# Patient Record
Sex: Male | Born: 1945
Health system: Southern US, Community
[De-identification: ages and names within clinical notes are randomized; demographics above are authoritative.]

## PROBLEM LIST (undated history)

## (undated) DIAGNOSIS — I10 Essential (primary) hypertension: Secondary | ICD-10-CM

## (undated) DIAGNOSIS — N529 Male erectile dysfunction, unspecified: Secondary | ICD-10-CM

## (undated) DIAGNOSIS — B9681 Helicobacter pylori [H. pylori] as the cause of diseases classified elsewhere: Secondary | ICD-10-CM

## (undated) DIAGNOSIS — K219 Gastro-esophageal reflux disease without esophagitis: Secondary | ICD-10-CM

## (undated) DIAGNOSIS — K2970 Gastritis, unspecified, without bleeding: Secondary | ICD-10-CM

## (undated) DIAGNOSIS — K8020 Calculus of gallbladder without cholecystitis without obstruction: Secondary | ICD-10-CM

## (undated) DIAGNOSIS — M545 Low back pain, unspecified: Secondary | ICD-10-CM

## (undated) DIAGNOSIS — G8929 Other chronic pain: Secondary | ICD-10-CM

## (undated) DIAGNOSIS — I2510 Atherosclerotic heart disease of native coronary artery without angina pectoris: Secondary | ICD-10-CM

## (undated) DIAGNOSIS — Z9289 Personal history of other medical treatment: Secondary | ICD-10-CM

## (undated) DIAGNOSIS — N400 Enlarged prostate without lower urinary tract symptoms: Secondary | ICD-10-CM

## (undated) DIAGNOSIS — D126 Benign neoplasm of colon, unspecified: Secondary | ICD-10-CM

## (undated) DIAGNOSIS — E785 Hyperlipidemia, unspecified: Secondary | ICD-10-CM

## (undated) HISTORY — PX: GASTRECTOMY: SHX58

## (undated) HISTORY — DX: Low back pain: M54.5

## (undated) HISTORY — DX: Other chronic pain: G89.29

## (undated) HISTORY — DX: Benign neoplasm of colon, unspecified: D12.6

## (undated) HISTORY — DX: Gastritis, unspecified, without bleeding: K29.70

## (undated) HISTORY — DX: Essential (primary) hypertension: I10

## (undated) HISTORY — DX: Atherosclerotic heart disease of native coronary artery without angina pectoris: I25.10

## (undated) HISTORY — DX: Helicobacter pylori (H. pylori) as the cause of diseases classified elsewhere: B96.81

## (undated) HISTORY — DX: Male erectile dysfunction, unspecified: N52.9

## (undated) HISTORY — DX: Personal history of other medical treatment: Z92.89

## (undated) HISTORY — DX: Low back pain, unspecified: M54.50

## (undated) HISTORY — DX: Hyperlipidemia, unspecified: E78.5

## (undated) HISTORY — DX: Calculus of gallbladder without cholecystitis without obstruction: K80.20

## (undated) SURGERY — ESOPHAGOGASTRODUODENOSCOPY (EGD) WITH PROPOFOL
Anesthesia: Monitor Anesthesia Care

---

## 1996-04-24 HISTORY — PX: CORONARY ARTERY BYPASS GRAFT: SHX141

## 1997-08-29 ENCOUNTER — Emergency Department (HOSPITAL_COMMUNITY): Admission: EM | Admit: 1997-08-29 | Discharge: 1997-08-29 | Payer: Self-pay | Admitting: Emergency Medicine

## 2003-06-11 ENCOUNTER — Encounter: Admission: RE | Admit: 2003-06-11 | Discharge: 2003-06-11 | Payer: Self-pay | Admitting: Family Medicine

## 2003-06-16 ENCOUNTER — Encounter: Admission: RE | Admit: 2003-06-16 | Discharge: 2003-06-16 | Payer: Self-pay | Admitting: Family Medicine

## 2003-10-15 ENCOUNTER — Encounter: Admission: RE | Admit: 2003-10-15 | Discharge: 2003-10-15 | Payer: Self-pay | Admitting: Family Medicine

## 2004-07-20 ENCOUNTER — Ambulatory Visit: Payer: Self-pay | Admitting: Family Medicine

## 2004-07-27 ENCOUNTER — Ambulatory Visit: Payer: Self-pay | Admitting: Family Medicine

## 2004-08-17 ENCOUNTER — Ambulatory Visit: Payer: Self-pay | Admitting: Family Medicine

## 2004-12-28 ENCOUNTER — Ambulatory Visit: Payer: Self-pay | Admitting: Family Medicine

## 2005-07-05 ENCOUNTER — Ambulatory Visit: Payer: Self-pay | Admitting: Family Medicine

## 2005-09-26 ENCOUNTER — Ambulatory Visit: Payer: Self-pay | Admitting: Family Medicine

## 2005-09-27 ENCOUNTER — Encounter: Payer: Self-pay | Admitting: Family Medicine

## 2005-10-17 ENCOUNTER — Ambulatory Visit: Payer: Self-pay | Admitting: Family Medicine

## 2005-10-17 LAB — FECAL OCCULT BLOOD, GUAIAC: Fecal Occult Blood: NEGATIVE

## 2005-11-14 ENCOUNTER — Ambulatory Visit: Payer: Self-pay | Admitting: Family Medicine

## 2005-11-22 ENCOUNTER — Ambulatory Visit: Payer: Self-pay | Admitting: Gastroenterology

## 2005-12-29 ENCOUNTER — Ambulatory Visit: Payer: Self-pay | Admitting: Family Medicine

## 2006-03-19 ENCOUNTER — Ambulatory Visit: Payer: Self-pay | Admitting: Family Medicine

## 2007-01-09 ENCOUNTER — Ambulatory Visit: Payer: Self-pay | Admitting: Family Medicine

## 2007-01-09 DIAGNOSIS — F528 Other sexual dysfunction not due to a substance or known physiological condition: Secondary | ICD-10-CM | POA: Insufficient documentation

## 2007-01-09 DIAGNOSIS — IMO0002 Reserved for concepts with insufficient information to code with codable children: Secondary | ICD-10-CM

## 2007-01-09 DIAGNOSIS — I10 Essential (primary) hypertension: Secondary | ICD-10-CM | POA: Insufficient documentation

## 2007-01-09 DIAGNOSIS — K209 Esophagitis, unspecified without bleeding: Secondary | ICD-10-CM | POA: Insufficient documentation

## 2007-01-09 DIAGNOSIS — Z951 Presence of aortocoronary bypass graft: Secondary | ICD-10-CM | POA: Insufficient documentation

## 2007-01-09 DIAGNOSIS — I2510 Atherosclerotic heart disease of native coronary artery without angina pectoris: Secondary | ICD-10-CM

## 2007-01-09 DIAGNOSIS — E785 Hyperlipidemia, unspecified: Secondary | ICD-10-CM

## 2007-01-10 ENCOUNTER — Ambulatory Visit (HOSPITAL_COMMUNITY): Admission: RE | Admit: 2007-01-10 | Discharge: 2007-01-10 | Payer: Self-pay | Admitting: Family Medicine

## 2007-01-16 ENCOUNTER — Encounter: Payer: Self-pay | Admitting: Family Medicine

## 2007-01-23 ENCOUNTER — Encounter: Admission: RE | Admit: 2007-01-23 | Discharge: 2007-01-23 | Payer: Self-pay | Admitting: Orthopaedic Surgery

## 2007-04-22 ENCOUNTER — Encounter: Payer: Self-pay | Admitting: Family Medicine

## 2007-05-01 ENCOUNTER — Encounter: Payer: Self-pay | Admitting: Family Medicine

## 2007-05-27 ENCOUNTER — Telehealth: Payer: Self-pay | Admitting: Family Medicine

## 2007-06-10 ENCOUNTER — Ambulatory Visit: Payer: Self-pay | Admitting: Gastroenterology

## 2007-06-23 LAB — HM COLONOSCOPY

## 2007-06-24 ENCOUNTER — Encounter: Payer: Self-pay | Admitting: Family Medicine

## 2007-06-24 ENCOUNTER — Ambulatory Visit: Payer: Self-pay | Admitting: Gastroenterology

## 2007-08-23 ENCOUNTER — Telehealth: Payer: Self-pay | Admitting: Family Medicine

## 2007-11-22 ENCOUNTER — Telehealth (INDEPENDENT_AMBULATORY_CARE_PROVIDER_SITE_OTHER): Payer: Self-pay | Admitting: *Deleted

## 2007-11-26 ENCOUNTER — Ambulatory Visit: Payer: Self-pay | Admitting: Family Medicine

## 2007-12-06 LAB — CONVERTED CEMR LAB
ALT: 28 units/L (ref 0–53)
AST: 26 units/L (ref 0–37)
Alkaline Phosphatase: 93 units/L (ref 39–117)
Bilirubin, Direct: 0.3 mg/dL (ref 0.0–0.3)
CO2: 29 meq/L (ref 19–32)
Calcium: 9.4 mg/dL (ref 8.4–10.5)
Chloride: 106 meq/L (ref 96–112)
Glucose, Bld: 94 mg/dL (ref 70–99)
Hemoglobin: 14.8 g/dL (ref 13.0–17.0)
LDL Cholesterol: 86 mg/dL (ref 0–99)
Lymphocytes Relative: 14.4 % (ref 12.0–46.0)
Monocytes Relative: 12.2 % — ABNORMAL HIGH (ref 3.0–12.0)
Neutro Abs: 5.8 10*3/uL (ref 1.4–7.7)
Neutrophils Relative %: 71.9 % (ref 43.0–77.0)
RBC: 4.91 M/uL (ref 4.22–5.81)
RDW: 12.8 % (ref 11.5–14.6)
Sodium: 142 meq/L (ref 135–145)
TSH: 0.35 microintl units/mL (ref 0.35–5.50)
Total CHOL/HDL Ratio: 3.2
Total Protein: 7.3 g/dL (ref 6.0–8.3)
Triglycerides: 88 mg/dL (ref 0–149)

## 2008-03-05 ENCOUNTER — Ambulatory Visit: Payer: Self-pay | Admitting: Family Medicine

## 2008-04-20 ENCOUNTER — Encounter: Payer: Self-pay | Admitting: Family Medicine

## 2008-05-13 ENCOUNTER — Encounter: Payer: Self-pay | Admitting: Family Medicine

## 2008-09-24 ENCOUNTER — Telehealth: Payer: Self-pay | Admitting: Family Medicine

## 2009-01-01 ENCOUNTER — Ambulatory Visit: Payer: Self-pay | Admitting: Family Medicine

## 2009-01-04 LAB — CONVERTED CEMR LAB
AST: 27 units/L (ref 0–37)
Alkaline Phosphatase: 85 units/L (ref 39–117)
Basophils Relative: 0.3 % (ref 0.0–3.0)
Bilirubin, Direct: 0.2 mg/dL (ref 0.0–0.3)
Creatinine, Ser: 1.1 mg/dL (ref 0.4–1.5)
Eosinophils Absolute: 0.1 10*3/uL (ref 0.0–0.7)
Glucose, Bld: 103 mg/dL — ABNORMAL HIGH (ref 70–99)
HDL: 44.3 mg/dL (ref >39.00)
Hemoglobin: 15 g/dL (ref 13.0–17.0)
LDL Cholesterol: 63 mg/dL (ref 0–99)
Lymphocytes Relative: 20.7 % (ref 12.0–46.0)
Monocytes Relative: 8.3 % (ref 3.0–12.0)
Neutro Abs: 4.6 10*3/uL (ref 1.4–7.7)
Neutrophils Relative %: 69.8 % (ref 43.0–77.0)
Phosphorus: 2.4 mg/dL (ref 2.3–4.6)
Potassium: 3.7 meq/L (ref 3.5–5.1)
RBC: 4.95 M/uL (ref 4.22–5.81)
Sodium: 143 meq/L (ref 135–145)
TSH: 0.24 microintl units/mL — ABNORMAL LOW (ref 0.35–5.50)
Total CHOL/HDL Ratio: 3
Total Protein: 7.2 g/dL (ref 6.0–8.3)
VLDL: 12.2 mg/dL (ref 0.0–40.0)
WBC: 6.6 10*3/uL (ref 4.5–10.5)

## 2009-01-05 ENCOUNTER — Ambulatory Visit: Payer: Self-pay | Admitting: Family Medicine

## 2009-01-05 DIAGNOSIS — E039 Hypothyroidism, unspecified: Secondary | ICD-10-CM | POA: Insufficient documentation

## 2009-01-06 LAB — CONVERTED CEMR LAB
Free T4: 0.9 ng/dL (ref 0.6–1.6)
T3, Free: 2.8 pg/mL (ref 2.3–4.2)

## 2009-05-12 ENCOUNTER — Encounter: Payer: Self-pay | Admitting: Family Medicine

## 2009-06-18 ENCOUNTER — Encounter: Payer: Self-pay | Admitting: Family Medicine

## 2009-12-31 ENCOUNTER — Ambulatory Visit: Payer: Self-pay | Admitting: Family Medicine

## 2010-01-03 LAB — CONVERTED CEMR LAB
AST: 50 units/L — ABNORMAL HIGH (ref 0–37)
Albumin: 4 g/dL (ref 3.5–5.2)
BUN: 14 mg/dL (ref 6–23)
Basophils Absolute: 0 10*3/uL (ref 0.0–0.1)
CO2: 30 meq/L (ref 19–32)
Chloride: 103 meq/L (ref 96–112)
Cholesterol: 145 mg/dL (ref 0–200)
Creatinine, Ser: 0.9 mg/dL (ref 0.4–1.5)
Eosinophils Absolute: 0.1 10*3/uL (ref 0.0–0.7)
HCT: 41.5 % (ref 39.0–52.0)
HDL: 44 mg/dL (ref >39.00)
Hemoglobin: 14.1 g/dL (ref 13.0–17.0)
Lymphs Abs: 1.6 10*3/uL (ref 0.7–4.0)
MCHC: 33.9 g/dL (ref 30.0–36.0)
MCV: 87.9 fL (ref 78.0–100.0)
Monocytes Absolute: 0.6 10*3/uL (ref 0.1–1.0)
Neutro Abs: 5.6 10*3/uL (ref 1.4–7.7)
PSA: 1.16 ng/mL (ref 0.10–4.00)
Platelets: 147 10*3/uL — ABNORMAL LOW (ref 150.0–400.0)
RDW: 13.6 % (ref 11.5–14.6)
TSH: 0.38 microintl units/mL (ref 0.35–5.50)
Total Bilirubin: 1.2 mg/dL (ref 0.3–1.2)
Total CHOL/HDL Ratio: 3
Triglycerides: 126 mg/dL (ref 0.0–149.0)

## 2010-04-05 ENCOUNTER — Ambulatory Visit: Payer: Self-pay | Admitting: Family Medicine

## 2010-04-05 DIAGNOSIS — R740 Nonspecific elevation of levels of transaminase and lactic acid dehydrogenase [LDH]: Secondary | ICD-10-CM

## 2010-04-11 LAB — CONVERTED CEMR LAB
Albumin: 4.1 g/dL (ref 3.5–5.2)
Alkaline Phosphatase: 121 units/L — ABNORMAL HIGH (ref 39–117)
Bilirubin, Direct: 0.1 mg/dL (ref 0.0–0.3)
Total Bilirubin: 1.1 mg/dL (ref 0.3–1.2)

## 2010-05-02 ENCOUNTER — Encounter: Payer: Self-pay | Admitting: Family Medicine

## 2010-05-09 ENCOUNTER — Encounter: Payer: Self-pay | Admitting: Family Medicine

## 2010-05-26 NOTE — Letter (Signed)
Summary: [facility]  [facility]   Imported By: Lanelle Bal 05/21/2009 13:49:15  _____________________________________________________________________  External Attachment:    Type:   Image     Comment:   External Document

## 2010-05-26 NOTE — Assessment & Plan Note (Signed)
Summary: ROA FOR REFILL OF MEDS/JRR   Vital Signs:  Patient profile:   65 year old male Height:      69 inches Weight:      187 pounds BMI:     27.71 Temp:     98.8 degrees F oral Pulse rate:   60 / minute Pulse rhythm:   regular BP sitting:   124 / 70  (left arm) Cuff size:   regular  Vitals Entered By: Lewanda Rife LPN (December 31, 2009 11:23 AM) CC: refill med   History of Present Illness: here for f/u of lipids and HTN and thyroid monitoring   is feeling just great  nothing new  is taking care of himself     wt is up 3 lb- knows he needs to eat a bit less - hard time doing it   bp is good at 124/70 there was some question about ? inaccurate cuff at cardiol visit and higher readings at home  due for chol check on statin   CAD is stable- will have myoview in dec  thyroid labs - once tsh low- repeat nl   does not get flu shots  does not want pneumovax   a lot of gas in ams  colonosc n 2009   Allergies: 1)  Lipitor 2)  * Calcium Channel Blocker  Past History:  Past Medical History: Last updated: December 25, 2007 Blood transfusion Coronary artery disease Hyperlipidemia Hypertension ED chronic low back pain with radiculopathy  Past Surgical History: Last updated: 06/27/2007 Gastrectomy- partial Colonoscopy- normal (1998) Coronary artery bypass graft x 1 (1998) Colonoscopy- int and ext hemorrhoids (07/2002) CT abd- ? gallstones and liver hemangioma (05/2003) Cardiolite- ok (06/2003) [place] abd- stable hemangioma, gallbladder sludge and tiny stones (09/2003) Stress test- low risk study (02/2005) FFS (1995) 3/09 colonoscopy hemorrhoids  Family History: Last updated: 12/25/2007 Father: died from unknown causes Mother: HTN Siblings: 3 brothers- one died from throat cancer. 4 sisters, one died from unknown type of cancer- pt feels that alcohol was the cause of their deaths brother with colon cancer   Social History: Last updated: 12-25-07 Marital  Status: Married Children: 3 Occupation: [employer] never smoked   Risk Factors: Smoking Status: never (01/09/2007)  Review of Systems General:  Denies fatigue, fever, loss of appetite, and malaise. Eyes:  Denies blurring and eye irritation. CV:  Denies chest pain or discomfort, lightheadness, and palpitations. Resp:  Denies cough, shortness of breath, and wheezing. GI:  Complains of gas; denies abdominal pain, change in bowel habits, indigestion, and nausea. GU:  Denies urinary frequency and urinary hesitancy. MS:  Denies joint pain, joint redness, and joint swelling. Derm:  Denies itching, lesion(s), poor wound healing, and rash. Neuro:  Denies headaches, numbness, tingling, and weakness. Psych:  Denies anxiety and depression. Endo:  Denies cold intolerance, excessive thirst, excessive urination, and heat intolerance. Heme:  Denies abnormal bruising and bleeding.  Physical Exam  General:  Well-developed,well-nourished,in no acute distress; alert,appropriate and cooperative throughout examination Head:  normocephalic, atraumatic, and no abnormalities observed.   Eyes:  vision grossly intact, pupils equal, pupils round, and pupils reactive to light.   Ears:  TMs clear scant cerumen Nose:  no nasal discharge.   Mouth:  pharynx pink and moist.   Neck:  supple with full rom and no masses or thyromegally, no JVD or carotid bruit  Chest Wall:  No deformities, masses, tenderness or gynecomastia noted. Lungs:  Normal respiratory effort, chest expands symmetrically. Lungs are clear to auscultation, no  crackles or wheezes. Heart:  Normal rate and regular rhythm. S1 and S2 normal without gallop, murmur, click, rub or other extra sounds. Abdomen:  Bowel sounds positive,abdomen soft and non-tender without masses, organomegaly or hernias noted. baseline scars evident no renal bruits  Msk:  No deformity or scoliosis noted of thoracic or lumbar spine.   Pulses:  R and L  carotid,radial,femoral,dorsalis pedis and posterior tibial pulses are full and equal bilaterally Extremities:  No clubbing, cyanosis, edema, or deformity noted with normal full range of motion of all joints.   Neurologic:  sensation intact to light touch, gait normal, and DTRs symmetrical and normal.   Skin:  Intact without suspicious lesions or rashes Cervical Nodes:  No lymphadenopathy noted Inguinal Nodes:  No significant adenopathy Psych:  normal affect, talkative and pleasant    Impression & Recommendations:  Problem # 1:  HYPERTENSION (ICD-401.9) Assessment Unchanged  this continues to be well controlled without change meds refilled  lab today keep up the walking  disc dec portions to prevent wt gain His updated medication list for this problem includes:    Diovan Hct 80-12.5 Mg Tabs (Valsartan-hydrochlorothiazide) .Marland Kitchen... 1 by mouth once daily    Atenolol 50 Mg Tabs (Atenolol) .Marland Kitchen... 1 by mouth once daily  Orders: Venipuncture (60454) TLB-Lipid Panel (80061-LIPID) TLB-Renal Function Panel (80069-RENAL) TLB-CBC Platelet - w/Differential (85025-CBCD) TLB-Hepatic/Liver Function Pnl (80076-HEPATIC) TLB-TSH (Thyroid Stimulating Hormone) (09811-BJY) Prescription Created Electronically [id number])  BP today: 124/70 Prior BP: 120/60 (01/01/2009)  Labs Reviewed: K+: 3.7 (01/01/2009) Creat: : 1.1 (01/01/2009)   Chol: 119 (01/01/2009)   HDL: 44.30 (01/01/2009)   LDL: 63 (01/01/2009)   TG: 61.0 (01/01/2009)  Problem # 2:  ERECTILE DYSFUNCTION (ICD-302.72) Assessment: Unchanged pt cannot afford cialis and does not tol other meds  no samples avail today His updated medication list for this problem includes:    Cialis 20 Mg Tabs (Tadalafil) .Marland Kitchen... 1 by mouth up to every 3 days as needed before sexual activity  Problem # 3:  HYPERLIPIDEMIA (ICD-272.4) Assessment: Unchanged  labs today expect continued good control on statin and diet  rev low sat fat diet  His updated medication  list for this problem includes:    Lipitor 10 Mg Tabs (Atorvastatin calcium) .Marland Kitchen... 1 by mouth once daily  Orders: Venipuncture (62130) TLB-Lipid Panel (80061-LIPID) TLB-Renal Function Panel (80069-RENAL) TLB-CBC Platelet - w/Differential (85025-CBCD) TLB-Hepatic/Liver Function Pnl (80076-HEPATIC) TLB-TSH (Thyroid Stimulating Hormone) (86578-ION) Prescription Created Electronically [phone])  Labs Reviewed: SGOT: 27 (01/01/2009)   SGPT: 25 (01/01/2009)   HDL:44.30 (01/01/2009), 47.1 (11/26/2007)  LDL:63 (01/01/2009), 86 (11/26/2007)  Chol:119 (01/01/2009), 151 (11/26/2007)  Trig:61.0 (01/01/2009), 88 (11/26/2007)  Problem # 4:  CORONARY ARTERY DISEASE (ICD-414.00) Assessment: Comment Only  doing well - rev last cardiol note for myoview in dec no symptoms continue aggresive risk reduction His updated medication list for this problem includes:    Diovan Hct 80-12.5 Mg Tabs (Valsartan-hydrochlorothiazide) .Marland Kitchen... 1 by mouth once daily    Aspirin Ec 325 Mg Tbec (Aspirin) ..... One by mouth daily    Atenolol 50 Mg Tabs (Atenolol) .Marland Kitchen... 1 by mouth once daily  Orders: Prescription Created Electronically [phone])  Complete Medication List: 1)  Diovan Hct 80-12.5 Mg Tabs (Valsartan-hydrochlorothiazide) .Marland Kitchen.. 1 by mouth once daily 2)  Lipitor 10 Mg Tabs (Atorvastatin calcium) .Marland Kitchen.. 1 by mouth once daily 3)  Aspirin Ec 325 Mg Tbec (Aspirin) .... One by mouth daily 4)  Prilosec 20 Mg Cpdr (Omeprazole) .... One by mouth once daily as needed 5)  Atenolol  50 Mg Tabs (Atenolol) .Marland Kitchen.. 1 by mouth once daily 6)  Cialis 20 Mg Tabs (Tadalafil) .Marland Kitchen.. 1 by mouth up to every 3 days as needed before sexual activity  Other Orders: TLB-PSA (Prostate Specific Antigen) (84153-PSA)  Patient Instructions: 1)  keep up the good work with healthy diet and exercise  2)  labs today  3)  I sent your px to the pharmacy  Prescriptions: ATENOLOL 50 MG  TABS (ATENOLOL) 1 by mouth once daily  #30 x 11   Entered and  Authorized by:   Judith Part MD   Signed by:   Judith Part MD on 12/31/2009   Method used:   [facility] to        [id number]       [address]       [place], [place]  [address]       Ph: [phone]       Fax: [phone]   RxID:   [id number] PRILOSEC 20 MG  CPDR (OMEPRAZOLE) one by mouth once daily as needed  #30 x 11   Entered and Authorized by:   Judith Part MD   Signed by:   Judith Part MD on 12/31/2009   Method used:   [facility] to        [facility] [address] [id number]       [address]       [place], [place]  [address]       Ph: [phone]       Fax: [phone]   RxID:   [id number] LIPITOR 10 MG TABS (ATORVASTATIN CALCIUM) 1 by mouth once daily  #30 x 11   Entered and Authorized by:   Judith Part MD   Signed by:   Judith Part MD on 12/31/2009   Method used:   [facility]        [facility] [address] [id number]       [address]       [place], [place]  [address]       Ph: [phone]       Fax: [phone]   RxID:   [id number] DIOVAN HCT 80-12.5 MG TABS (VALSARTAN-HYDROCHLOROTHIAZIDE) 1 by mouth once daily  #30 x 11   Entered and Authorized by:   Judith Part MD   Signed by:   Judith Part MD on 12/31/2009   Method used:   [facility]        [facility] [address] [id number]       [address]       [place], [place]  [address]       Ph: [phone]       Fax: [phone]   RxID:   [id number]   Current Allergies (reviewed today): LIPITOR * CALCIUM CHANNEL BLOCKER

## 2010-05-26 NOTE — Letter (Signed)
Summary: [facility]  [facility]   Imported By: Lanelle Bal 06/29/2009 09:43:08  _____________________________________________________________________  External Attachment:    Type:   Image     Comment:   External Document

## 2010-06-01 NOTE — Letter (Signed)
Summary: [facility]  [facility]   Imported By: Lester Lake Davis 05/23/2010 09:14:15  _____________________________________________________________________  External Attachment:    Type:   Image     Comment:   External Document

## 2010-07-08 ENCOUNTER — Encounter: Payer: Self-pay | Admitting: *Deleted

## 2010-07-08 ENCOUNTER — Other Ambulatory Visit (INDEPENDENT_AMBULATORY_CARE_PROVIDER_SITE_OTHER): Payer: Medicare Other

## 2010-07-08 ENCOUNTER — Other Ambulatory Visit: Payer: Self-pay | Admitting: Family Medicine

## 2010-07-08 DIAGNOSIS — R7402 Elevation of levels of lactic acid dehydrogenase (LDH): Secondary | ICD-10-CM

## 2010-07-08 DIAGNOSIS — R740 Nonspecific elevation of levels of transaminase and lactic acid dehydrogenase [LDH]: Secondary | ICD-10-CM

## 2010-07-08 DIAGNOSIS — E785 Hyperlipidemia, unspecified: Secondary | ICD-10-CM

## 2010-07-08 LAB — HEPATIC FUNCTION PANEL
ALT: 28 U/L (ref 0–53)
Total Bilirubin: 0.8 mg/dL (ref 0.3–1.2)

## 2010-07-08 LAB — LIPID PANEL
HDL: 51.3 mg/dL (ref >39.00)
LDL Cholesterol: 81 mg/dL (ref 0–99)
VLDL: 30.2 mg/dL (ref 0.0–40.0)

## 2010-07-12 ENCOUNTER — Other Ambulatory Visit: Payer: Self-pay

## 2010-09-09 NOTE — Procedures (Signed)
[facility]                                 ULTRASOUND STUDY   Jerry Farrell, Jerry Farrell                       MRN:          [id number]  DATE:11/22/2005                            DOB:          03/09/1946    READING PHYSICIAN:  Ulyess Mort, MD   ACCESSION #:  [id number]   INDICATION:  Elevated liver enzymes.   PROCEDURE:  Multiplanar abdominal ultrasound imaging was performed in the  upright, supine, right and left lateral decubitus positions.   RESULTS:  Abdominal aorta was within normal limits, measuring 1.9 cm.  The  inferior vena cava is patent.   The pancreas was fairly well-visualized except for the duodenum overlying  the head of the pancreas to some degree, but otherwise it appeared within  normal limits.   Gallbladder was well-visualized in its entirety.  The walls were of normal  thickness.  There was no pericholecystic fluid and there were no stones or  sludge noted, although sludge had been seen on a previous procedure several  years ago.  '   The common bile duct measured within normal limits at 4.6 mm without  evidence of intraluminal foci.   The liver revealed 2 hyperechoic areas measuring approximately 21 x 21 x 22  mm, compatible with hemangioma; these were in the right lobe.   The right kidney was normal, measuring 10.6 mm; the left kidney was normal,  measuring 12.0 cm.   The spleen was normal, measuring 10.5 cm without parenchymal lesion.   IMPRESSION:  Essentially normal abdominal ultrasound except for 1 hemangioma  seen in the liver, as noted, probably of no significance.                                  Ulyess Mort, MD   SML/MedQ  DD:  11/22/2005  DT:  11/23/2005  Job #:  [id number]   cc:   Marne A. Milinda Antis, MD

## 2010-12-16 ENCOUNTER — Other Ambulatory Visit: Payer: Self-pay | Admitting: Family Medicine

## 2010-12-16 NOTE — Telephone Encounter (Signed)
[facility] request refill Omeprazole 20mg  #30 x 1 with note attached pt needs to call for appt.

## 2011-01-19 ENCOUNTER — Other Ambulatory Visit: Payer: Self-pay | Admitting: *Deleted

## 2011-01-19 MED ORDER — VALSARTAN-HYDROCHLOROTHIAZIDE 80-12.5 MG PO TABS: 1.0000 | ORAL_TABLET | Freq: Every day | ORAL | Status: DC

## 2011-01-19 MED ORDER — ATORVASTATIN CALCIUM 10 MG PO TABS: 10.0000 mg | ORAL_TABLET | Freq: Every day | ORAL | Status: DC

## 2011-01-19 MED ORDER — ATENOLOL 50 MG PO TABS: 50.0000 mg | ORAL_TABLET | Freq: Every day | ORAL | Status: DC

## 2011-02-21 ENCOUNTER — Other Ambulatory Visit: Payer: Self-pay

## 2011-02-21 MED ORDER — OMEPRAZOLE 20 MG PO CPDR: 20.0000 mg | DELAYED_RELEASE_CAPSULE | Freq: Every day | ORAL | Status: DC

## 2011-02-21 NOTE — Telephone Encounter (Signed)
[facility] request refill Omeprazole 20 mg # 30 x 0 with note again pt needs to call for appt.

## 2011-03-15 ENCOUNTER — Encounter: Payer: Self-pay | Admitting: Family Medicine

## 2011-03-17 ENCOUNTER — Ambulatory Visit (INDEPENDENT_AMBULATORY_CARE_PROVIDER_SITE_OTHER): Payer: Medicare Other | Admitting: Family Medicine

## 2011-03-17 ENCOUNTER — Encounter: Payer: Self-pay | Admitting: Family Medicine

## 2011-03-17 VITALS — BP 120/70 | HR 59 | Temp 98.0°F | Ht 68.0 in | Wt 187.2 lb

## 2011-03-17 DIAGNOSIS — Z23 Encounter for immunization: Secondary | ICD-10-CM

## 2011-03-17 DIAGNOSIS — E785 Hyperlipidemia, unspecified: Secondary | ICD-10-CM

## 2011-03-17 DIAGNOSIS — I10 Essential (primary) hypertension: Secondary | ICD-10-CM

## 2011-03-17 DIAGNOSIS — R7401 Elevation of levels of liver transaminase levels: Secondary | ICD-10-CM

## 2011-03-17 LAB — COMPREHENSIVE METABOLIC PANEL
ALT: 21 U/L (ref 0–53)
Albumin: 4.1 g/dL (ref 3.5–5.2)
CO2: 28 mEq/L (ref 19–32)
Calcium: 9 mg/dL (ref 8.4–10.5)
Chloride: 104 mEq/L (ref 96–112)
GFR: 86.6 mL/min (ref >60.00)
Sodium: 140 mEq/L (ref 135–145)
Total Protein: 7.1 g/dL (ref 6.0–8.3)

## 2011-03-17 LAB — CBC WITH DIFFERENTIAL/PLATELET
Basophils Absolute: 0 10*3/uL (ref 0.0–0.1)
Hemoglobin: 15.1 g/dL (ref 13.0–17.0)
Lymphocytes Relative: 23 % (ref 12.0–46.0)
Monocytes Relative: 8.6 % (ref 3.0–12.0)
Platelets: 138 10*3/uL — ABNORMAL LOW (ref 150.0–400.0)
RDW: 13.6 % (ref 11.5–14.6)
WBC: 6.8 10*3/uL (ref 4.5–10.5)

## 2011-03-17 LAB — LIPID PANEL: Total CHOL/HDL Ratio: 3

## 2011-03-17 MED ORDER — ATORVASTATIN CALCIUM 10 MG PO TABS: 10.0000 mg | ORAL_TABLET | Freq: Every day | ORAL | Status: DC

## 2011-03-17 MED ORDER — ATENOLOL 50 MG PO TABS: 50.0000 mg | ORAL_TABLET | Freq: Every day | ORAL | Status: DC

## 2011-03-17 MED ORDER — OMEPRAZOLE 20 MG PO CPDR: 20.0000 mg | DELAYED_RELEASE_CAPSULE | Freq: Every day | ORAL | Status: DC

## 2011-03-17 MED ORDER — LIPITOR 10 MG PO TABS: 10.0000 mg | ORAL_TABLET | Freq: Every day | ORAL | Status: DC

## 2011-03-17 MED ORDER — VALSARTAN-HYDROCHLOROTHIAZIDE 80-12.5 MG PO TABS: 1.0000 | ORAL_TABLET | Freq: Every day | ORAL | Status: DC

## 2011-03-17 NOTE — Patient Instructions (Addendum)
Flu shot today  Blood pressure is good Keep working on [employer] and exercise  Labs today  I sent medicines to the pharmacy Follow up in 6 months for 30 minute check up with labs prior

## 2011-03-17 NOTE — Assessment & Plan Note (Signed)
This is better Lab Results  Component Value Date   ALT 28 07/08/2010   AST 27 07/08/2010   ALKPHOS 121* 07/08/2010   BILITOT 0.8 07/08/2010   Check today - keeping eye on them/ is on lipitor and low fat diet

## 2011-03-17 NOTE — Assessment & Plan Note (Signed)
bp in fair control at this time  No changes needed  Disc lifstyle change with low sodium diet and exercise   Refilled diovan hct

## 2011-03-17 NOTE — Assessment & Plan Note (Signed)
Labs today  Was well controlled with lipitor in march Disc goals for lipids and reasons to control them Rev labs with pt Rev low sat fat diet in detail  Enc to keep walking

## 2011-03-17 NOTE — Progress Notes (Signed)
Subjective:    Patient ID: Jerry Farrell, male    DOB: March 04, 1946, 65 y.o.   MRN: [id number]  HPI Here for f/u of chronic problems - HTN and hyperlipidemia (with CAD)   Wants to loose wt - is trying to take care of himself  Is a walker -- 4 miles per day    HTN - bp is 120/70 -- good control with arb -hct No cp or palpitations/ edema or ha  Wt is stable with bmi of 28  Last lipids fair on lipitor in spring Lab Results  Component Value Date   CHOL 162 07/08/2010   HDL 51.30 07/08/2010   LDLCALC 81 07/08/2010   TRIG 151.0* 07/08/2010   CHOLHDL 3 07/08/2010   diet- is pretty good - watching the sat fats in general  No cp  On lipitor for a while now-no side eff We are watching transaminases    Lab Results  Component Value Date   TSH 0.38 12/31/2009   Was ok off med  imms - has not had flu shot ever  Is open to getting it today  Patient Active Problem List  Diagnoses  . HYPERLIPIDEMIA  . ERECTILE DYSFUNCTION  . HYPERTENSION  . CORONARY ARTERY DISEASE  . ESOPHAGITIS  . BACK PAIN, LUMBAR, WITH RADICULOPATHY  . TRANSAMINASES, SERUM, ELEVATED   Past Medical History  Diagnosis Date  . Transfusion history   . CAD (coronary artery disease)     cardiolite ok 3/05, stress test- low risk study 11/06  . Hyperlipidemia   . Hypertension   . ED (erectile dysfunction)   . Chronic low back pain     with radiculopathy  . Gallstones     abd [place]- stable hamangioma, gallbladder sludge and tiny stones 09/2003   Past Surgical History  Procedure Date  . Gastrectomy     partial  . Coronary artery bypass graft 1998    x 1   History  Substance Use Topics  . Smoking status: Never Smoker   . Smokeless tobacco: Not on file  . Alcohol Use: Not on file   Family History  Problem Relation Age of Onset  . Hypertension Mother   . Cancer Sister     unknown type, pt feels alcohol related  . Cancer Brother     colon  . Cancer Brother     throat, pt feels alcohol related   Allergies    Allergen Reactions  . Atorvastatin     REACTION: elevated LFT's   Current Outpatient Prescriptions on File Prior to Visit  Medication Sig Dispense Refill  . aspirin 325 MG EC tablet Take 325 mg by mouth daily.        . tadalafil (CIALIS) 20 MG tablet Take one by mouth up to every 3 days as needed before sexual activity.            Review of Systems Review of Systems  Constitutional: Negative for fever, appetite change, fatigue and unexpected weight change.  Eyes: Negative for pain and visual disturbance.  Respiratory: Negative for cough and shortness of breath.   Cardiovascular: Negative for cp or palpitations    Gastrointestinal: Negative for nausea, diarrhea and constipation.  Genitourinary: Negative for urgency and frequency.  Skin: Negative for pallor or rash   Neurological: Negative for weakness, light-headedness, numbness and headaches.  Hematological: Negative for adenopathy. Does not bruise/bleed easily.  Psychiatric/Behavioral: Negative for dysphoric mood. The patient is not nervous/anxious.         Objective:  Physical Exam  Constitutional: He appears well-developed and well-nourished. No distress.  HENT:  Head: Normocephalic and atraumatic.  Mouth/Throat: Oropharynx is clear and moist.  Eyes: Conjunctivae and EOM are normal. Pupils are equal, round, and reactive to light. No scleral icterus.  Neck: Normal range of motion. Neck supple. No JVD present. Carotid bruit is not present. No thyromegaly present.  Cardiovascular: Normal rate, regular rhythm, normal heart sounds and intact distal pulses.  Exam reveals no gallop.   Pulmonary/Chest: Effort normal and breath sounds normal. No respiratory distress. He has no wheezes.  Abdominal: Soft. Bowel sounds are normal. He exhibits no distension and no abdominal bruit. There is no tenderness.  Musculoskeletal: Normal range of motion. He exhibits no edema.  Lymphadenopathy:    He has no cervical adenopathy.  Neurological:  He is alert. He has normal reflexes. Coordination normal.  Skin: Skin is warm and dry. No rash noted. No erythema. No pallor.  Psychiatric: He has a normal mood and affect.          Assessment & Plan:

## 2011-07-20 ENCOUNTER — Other Ambulatory Visit: Payer: Self-pay | Admitting: *Deleted

## 2011-07-20 MED ORDER — OMEPRAZOLE 20 MG PO CPDR: 20.0000 mg | DELAYED_RELEASE_CAPSULE | Freq: Every day | ORAL | Status: DC

## 2011-07-20 MED ORDER — VALSARTAN-HYDROCHLOROTHIAZIDE 80-12.5 MG PO TABS: 1.0000 | ORAL_TABLET | Freq: Every day | ORAL | Status: DC

## 2011-07-20 MED ORDER — ATENOLOL 50 MG PO TABS: 50.0000 mg | ORAL_TABLET | Freq: Every day | ORAL | Status: DC

## 2011-07-20 MED ORDER — LIPITOR 10 MG PO TABS: 10.0000 mg | ORAL_TABLET | Freq: Every day | ORAL | Status: DC

## 2012-02-12 ENCOUNTER — Other Ambulatory Visit: Payer: Self-pay | Admitting: Ophthalmology

## 2012-02-12 NOTE — H&P (Signed)
Pre-operative History and Physical for Ophthalmic Surgery  Jerry Farrell 02/12/2012                  Chief Complaint: Decreased vision.  Diagnosis: Epi-retinal Membrane, Cystoid Macular Edema, Combined Cataract Left Eye  Allergies  Allergen Reactions  . Atorvastatin     REACTION: elevated LFT's      Prior to Admission medications   Medication Sig Start Date End Date Taking? Authorizing Provider  aspirin 325 MG EC tablet Take 325 mg by mouth daily.      Historical Provider, MD  atenolol (TENORMIN) 50 MG tablet Take 1 tablet (50 mg total) by mouth daily. 07/20/11 07/19/12  Marne A Tower, MD  LIPITOR 10 MG tablet Take 1 tablet (10 mg total) by mouth daily. 07/20/11 07/19/12  Marne A Tower, MD  omeprazole (PRILOSEC) 20 MG capsule Take 1 capsule (20 mg total) by mouth daily. 07/20/11   Marne A Tower, MD  tadalafil (CIALIS) 20 MG tablet Take one by mouth up to every 3 days as needed before sexual activity.     Historical Provider, MD  valsartan-hydrochlorothiazide (DIOVAN HCT) 80-12.5 MG per tablet Take 1 tablet by mouth daily. 07/20/11 07/19/12  Marne A Tower, MD    Planned Procedure: Pars Plana Vitrectomy and Membrane Peeling                                      Phacoemulsification, Posterior Chamber Intra-ocular Lens Left Eye                                      Acrysof [id number]  + 21. 00 Diopter for implant OS  There were no vitals filed for this visit.  Pulse: 76         Temp: NE        Resp:  16  ROS: negative  Past Medical History  Diagnosis Date  . Transfusion history   . CAD (coronary artery disease)     cardiolite ok 3/05, stress test- low risk study 11/06  . Hyperlipidemia   . Hypertension   . ED (erectile dysfunction)   . Chronic low back pain     with radiculopathy  . Gallstones     abd US- stable hamangioma, gallbladder sludge and tiny stones 09/2003    Past Surgical History  Procedure Date  . Gastrectomy     partial  . Coronary artery bypass graft 1998    x 1      History   Social History  . Marital Status: Married    Spouse Name: N/A    Number of Children: 3  . Years of Education: N/A   Occupational History  . [employer]    Social History Main Topics  . Smoking status: Never Smoker   . Smokeless tobacco: Not on file  . Alcohol Use: Not on file  . Drug Use: Not on file  . Sexually Active: Not on file   Other Topics Concern  . Not on file   Social History Narrative  . No narrative on file     The following examination is for anesthesia clearance for minimally invasive Ophthalmic surgery. It is primarily to document heart and lung findings and is not intended to elucidate unknown general medical conditions inclusive of abdominal masses, lung lesions, etc.     General Constitution:  within normal limits   Alertness/Orientation:  Person, time place     yes   HEENT:  Eye Findings:  Epiretinal Membrane ,  Cystoid Macular Edema ,  Combined Cataract                  left eye  Neck: supple without masses  Chest/Lungs: clear to auscultation  Cardiac: Normal S1 and S2 without Murmur, S3 or S4  Neuro: non-focal  Impression:  Epiretinal Membrane ,  Cystoid Macular Edema ,  Combined Cataract Left Eye   Planned Procedure:  Pars Plana Vitrectomy, Membrane Peeling, Phacoemulsification, Posterior Chamber Intraocular Lens     Jerry Banko, MD        

## 2012-03-05 NOTE — Pre-Procedure Instructions (Addendum)
20 Jerry Farrell  03/05/2012   Your procedure is scheduled on:  Wednesday, November 20th  Report to [facility] at  AM.  Call this number if you have problems the morning of surgery: [phone]                Remember:   Do not eat food or drink:After Midnight.   Take these medicines the morning of surgery with A SIP OF WATER: atenolol, prilosec   Do not wear jewelry, make-up or nail polish.  Do not wear lotions, powders, or perfumes.   Do not shave 48 hours prior to surgery. Men may shave face and neck.  Do not bring valuables to the hospital.  Contacts, dentures or bridgework may not be worn into surgery.  Leave suitcase in the car. After surgery it may be brought to your room.  For patients admitted to the hospital, checkout time is 11:00 AM the day of discharge.   Patients discharged the day of surgery will not be allowed to drive home.   Special Instructions: Shower using CHG 2 nights before surgery and the night before surgery.  If you shower the day of surgery use CHG.  Use special wash - you have one bottle of CHG for all showers.  You should use approximately 1/3 of the bottle for each shower.   Please read over the following fact sheets that you were given: Pain Booklet, Coughing and Deep Breathing and Surgical Site Infection Prevention

## 2012-03-06 ENCOUNTER — Encounter (HOSPITAL_COMMUNITY)
Admission: RE | Admit: 2012-03-06 | Discharge: 2012-03-06 | Disposition: A | Discharge status: Home / Self Care | Payer: Medicare Other | Source: Ambulatory Visit | Attending: Ophthalmology | Admitting: Ophthalmology

## 2012-03-06 ENCOUNTER — Encounter (HOSPITAL_COMMUNITY): Payer: Self-pay

## 2012-03-06 ENCOUNTER — Encounter (HOSPITAL_COMMUNITY)
Admission: RE | Admit: 2012-03-06 | Discharge: 2012-03-06 | Disposition: A | Discharge status: Home / Self Care | Payer: Medicare Other | Source: Ambulatory Visit | Attending: Anesthesiology | Admitting: Anesthesiology

## 2012-03-06 HISTORY — DX: Gastro-esophageal reflux disease without esophagitis: K21.9

## 2012-03-06 LAB — CBC
Hemoglobin: 15.5 g/dL (ref 13.0–17.0)
MCH: 29.6 pg (ref 26.0–34.0)
MCHC: 35.1 g/dL (ref 30.0–36.0)
Platelets: 155 10*3/uL (ref 150–400)
RBC: 5.23 MIL/uL (ref 4.22–5.81)

## 2012-03-06 LAB — SURGICAL PCR SCREEN: MRSA, PCR: NEGATIVE

## 2012-03-06 LAB — BASIC METABOLIC PANEL
CO2: 30 mEq/L (ref 19–32)
Calcium: 9.1 mg/dL (ref 8.4–10.5)
GFR calc non Af Amer: 85 mL/min — ABNORMAL LOW (ref >90)
Glucose, Bld: 97 mg/dL (ref 70–99)
Potassium: 4.1 mEq/L (ref 3.5–5.1)
Sodium: 143 mEq/L (ref 135–145)

## 2012-03-06 NOTE — Progress Notes (Signed)
[id number]  Have requested any and all cardiac notes from Dr. Hazle Coca office.Marland KitchenMarland KitchenDA

## 2012-03-12 MED ORDER — GATIFLOXACIN 0.5 % OP SOLN
1.0000 [drp] | OPHTHALMIC | Status: AC | PRN
  Administered 2012-03-13 (×3): 1 [drp] via OPHTHALMIC
  Filled 2012-03-12 (×2): qty 2.5

## 2012-03-12 MED ORDER — PREDNISOLONE ACETATE 1 % OP SUSP
1.0000 [drp] | OPHTHALMIC | Status: AC
  Administered 2012-03-13: 1 [drp] via OPHTHALMIC
  Filled 2012-03-12 (×2): qty 5

## 2012-03-12 MED ORDER — TETRACAINE HCL 0.5 % OP SOLN
2.0000 [drp] | OPHTHALMIC | Status: AC
  Administered 2012-03-13: 2 [drp] via OPHTHALMIC
  Filled 2012-03-12: qty 2

## 2012-03-12 MED ORDER — PHENYLEPHRINE HCL 2.5 % OP SOLN
1.0000 [drp] | OPHTHALMIC | Status: AC | PRN
  Administered 2012-03-13 (×3): 1 [drp] via OPHTHALMIC
  Filled 2012-03-12: qty 3

## 2012-03-13 ENCOUNTER — Ambulatory Visit (HOSPITAL_COMMUNITY)
Admission: RE | Admit: 2012-03-13 | Discharge: 2012-03-13 | Disposition: A | Discharge status: Home / Self Care | Payer: Medicare Other | Source: Ambulatory Visit | Attending: Ophthalmology | Admitting: Ophthalmology

## 2012-03-13 ENCOUNTER — Encounter (HOSPITAL_COMMUNITY)
Admission: RE | Disposition: A | Discharge status: Home / Self Care | Payer: Self-pay | Source: Ambulatory Visit | Attending: Ophthalmology

## 2012-03-13 ENCOUNTER — Encounter (HOSPITAL_COMMUNITY): Payer: Self-pay | Admitting: Anesthesiology

## 2012-03-13 ENCOUNTER — Encounter (HOSPITAL_COMMUNITY): Payer: Self-pay | Admitting: Surgery

## 2012-03-13 ENCOUNTER — Ambulatory Visit (HOSPITAL_COMMUNITY): Payer: Medicare Other | Admitting: Anesthesiology

## 2012-03-13 DIAGNOSIS — H2510 Age-related nuclear cataract, unspecified eye: Secondary | ICD-10-CM | POA: Insufficient documentation

## 2012-03-13 DIAGNOSIS — I10 Essential (primary) hypertension: Secondary | ICD-10-CM | POA: Insufficient documentation

## 2012-03-13 DIAGNOSIS — Z79899 Other long term (current) drug therapy: Secondary | ICD-10-CM | POA: Insufficient documentation

## 2012-03-13 DIAGNOSIS — H35359 Cystoid macular degeneration, unspecified eye: Secondary | ICD-10-CM | POA: Insufficient documentation

## 2012-03-13 HISTORY — PX: PARS PLANA VITRECTOMY: SHX2166

## 2012-03-13 HISTORY — PX: CATARACT EXTRACTION W/PHACO: SHX586

## 2012-03-13 HISTORY — PX: MEMBRANE PEEL: SHX5967

## 2012-03-13 SURGERY — PARS PLANA VITRECTOMY WITH 23 GAUGE
Anesthesia: General | Site: Eye | Laterality: Left | Wound class: Clean

## 2012-03-13 MED ORDER — INDOCYANINE GREEN 25 MG IV SOLR
25.0000 mg | INTRAVENOUS | Status: AC
  Administered 2012-03-13: 25 mg via INTRAVENOUS
  Filled 2012-03-13: qty 25

## 2012-03-13 MED ORDER — TRIAMCINOLONE ACETONIDE 40 MG/ML IJ SUSP
INTRAMUSCULAR | Status: DC | PRN
  Administered 2012-03-13: 40 mg

## 2012-03-13 MED ORDER — TETRACAINE HCL 0.5 % OP SOLN
OPHTHALMIC | Status: AC
  Filled 2012-03-13: qty 2

## 2012-03-13 MED ORDER — SODIUM CHLORIDE 0.9 % IV SOLN: INTRAVENOUS | Status: DC

## 2012-03-13 MED ORDER — FENTANYL CITRATE 0.05 MG/ML IJ SOLN: 25.0000 ug | INTRAMUSCULAR | Status: DC | PRN

## 2012-03-13 MED ORDER — SODIUM HYALURONATE 10 MG/ML IO SOLN
INTRAOCULAR | Status: AC
  Filled 2012-03-13: qty 0.85

## 2012-03-13 MED ORDER — MIDAZOLAM HCL 5 MG/5ML IJ SOLN
INTRAMUSCULAR | Status: DC | PRN
  Administered 2012-03-13: 1 mg via INTRAVENOUS

## 2012-03-13 MED ORDER — METOCLOPRAMIDE HCL 5 MG/ML IJ SOLN: 10.0000 mg | Freq: Once | INTRAMUSCULAR | Status: DC | PRN

## 2012-03-13 MED ORDER — HYPROMELLOSE (GONIOSCOPIC) 2.5 % OP SOLN
OPHTHALMIC | Status: DC | PRN
  Administered 2012-03-13: 1 [drp] via OPHTHALMIC

## 2012-03-13 MED ORDER — DEXAMETHASONE SODIUM PHOSPHATE 4 MG/ML IJ SOLN
INTRAMUSCULAR | Status: DC | PRN
  Administered 2012-03-13: 4 mg via INTRAVENOUS

## 2012-03-13 MED ORDER — CEFAZOLIN SUBCONJUNCTIVAL INJECTION 100 MG/0.5 ML
200.0000 mg | INJECTION | SUBCONJUNCTIVAL | Status: AC
  Administered 2012-03-13: 200 mg via SUBCONJUNCTIVAL
  Filled 2012-03-13: qty 1

## 2012-03-13 MED ORDER — PROVISC 10 MG/ML IO SOLN
INTRAOCULAR | Status: DC | PRN
  Administered 2012-03-13: 8.5 mg via INTRAOCULAR

## 2012-03-13 MED ORDER — EPHEDRINE SULFATE 50 MG/ML IJ SOLN
INTRAMUSCULAR | Status: DC | PRN
  Administered 2012-03-13: 10 mg via INTRAVENOUS
  Administered 2012-03-13: 5 mg via INTRAVENOUS
  Administered 2012-03-13 (×2): 10 mg via INTRAVENOUS
  Administered 2012-03-13 (×2): 5 mg via INTRAVENOUS

## 2012-03-13 MED ORDER — DEXAMETHASONE SODIUM PHOSPHATE 10 MG/ML IJ SOLN
INTRAMUSCULAR | Status: DC | PRN
  Administered 2012-03-13: 10 mg

## 2012-03-13 MED ORDER — BSS PLUS IO SOLN
INTRAOCULAR | Status: DC | PRN
  Administered 2012-03-13: 1 via INTRAOCULAR

## 2012-03-13 MED ORDER — HYPROMELLOSE (GONIOSCOPIC) 2.5 % OP SOLN
OPHTHALMIC | Status: AC
  Filled 2012-03-13: qty 15

## 2012-03-13 MED ORDER — OXYCODONE HCL 5 MG PO TABS: 5.0000 mg | ORAL_TABLET | Freq: Once | ORAL | Status: DC | PRN

## 2012-03-13 MED ORDER — LIDOCAINE HCL 2 % IJ SOLN
INTRAMUSCULAR | Status: AC
  Filled 2012-03-13: qty 20

## 2012-03-13 MED ORDER — BACITRACIN-POLYMYXIN B 500-10000 UNIT/GM OP OINT
TOPICAL_OINTMENT | OPHTHALMIC | Status: AC
  Filled 2012-03-13: qty 3.5

## 2012-03-13 MED ORDER — EPINEPHRINE HCL 1 MG/ML IJ SOLN
INTRAMUSCULAR | Status: DC | PRN
  Administered 2012-03-13: 1 mg

## 2012-03-13 MED ORDER — ONDANSETRON HCL 4 MG/2ML IJ SOLN
INTRAMUSCULAR | Status: DC | PRN
  Administered 2012-03-13 (×2): 4 mg via INTRAVENOUS

## 2012-03-13 MED ORDER — BSS IO SOLN
INTRAOCULAR | Status: AC
  Filled 2012-03-13: qty 500

## 2012-03-13 MED ORDER — TRAMADOL HCL 50 MG PO TABS: 50.0000 mg | ORAL_TABLET | ORAL | Status: DC | PRN

## 2012-03-13 MED ORDER — EPINEPHRINE HCL 1 MG/ML IJ SOLN
INTRAMUSCULAR | Status: AC
  Filled 2012-03-13: qty 1

## 2012-03-13 MED ORDER — ACETAZOLAMIDE SODIUM 500 MG IJ SOLR
INTRAMUSCULAR | Status: AC
  Filled 2012-03-13: qty 500

## 2012-03-13 MED ORDER — SODIUM CHLORIDE 0.9 % IV SOLN
INTRAVENOUS | Status: DC
  Administered 2012-03-13 (×2): via INTRAVENOUS

## 2012-03-13 MED ORDER — OXYCODONE HCL 5 MG/5ML PO SOLN: 5.0000 mg | Freq: Once | ORAL | Status: DC | PRN

## 2012-03-13 MED ORDER — NA CHONDROIT SULF-NA HYALURON 40-30 MG/ML IO SOLN
INTRAOCULAR | Status: AC
  Filled 2012-03-13: qty 0.5

## 2012-03-13 MED ORDER — BUPIVACAINE HCL (PF) 0.75 % IJ SOLN
INTRAMUSCULAR | Status: AC
  Filled 2012-03-13: qty 10

## 2012-03-13 MED ORDER — LIDOCAINE HCL (CARDIAC) 20 MG/ML IV SOLN
INTRAVENOUS | Status: DC | PRN
  Administered 2012-03-13: 60 mg via INTRAVENOUS

## 2012-03-13 MED ORDER — BSS IO SOLN
INTRAOCULAR | Status: DC | PRN
  Administered 2012-03-13: 500 mL via INTRAOCULAR

## 2012-03-13 MED ORDER — FENTANYL CITRATE 0.05 MG/ML IJ SOLN
INTRAMUSCULAR | Status: DC | PRN
  Administered 2012-03-13 (×3): 25 ug via INTRAVENOUS

## 2012-03-13 MED ORDER — BACITRACIN-POLYMYXIN B 500-10000 UNIT/GM OP OINT
TOPICAL_OINTMENT | OPHTHALMIC | Status: DC | PRN
  Administered 2012-03-13: 1 via OPHTHALMIC

## 2012-03-13 MED ORDER — NA CHONDROIT SULF-NA HYALURON 40-30 MG/ML IO SOLN
INTRAOCULAR | Status: DC | PRN
  Administered 2012-03-13 (×2): 0.5 mL via INTRAOCULAR

## 2012-03-13 MED ORDER — DEXAMETHASONE SODIUM PHOSPHATE 10 MG/ML IJ SOLN
INTRAMUSCULAR | Status: AC
  Filled 2012-03-13: qty 1

## 2012-03-13 MED ORDER — BSS PLUS IO SOLN
INTRAOCULAR | Status: AC
  Filled 2012-03-13: qty 500

## 2012-03-13 MED ORDER — PROPOFOL 10 MG/ML IV BOLUS
INTRAVENOUS | Status: DC | PRN
  Administered 2012-03-13: 60 mg via INTRAVENOUS
  Administered 2012-03-13: 200 mg via INTRAVENOUS

## 2012-03-13 SURGICAL SUPPLY — 82 items
ACCESSORY FRAGMATOME (MISCELLANEOUS) IMPLANT
APL SRG 3 HI ABS STRL LF PLS (MISCELLANEOUS)
APPLICATOR COTTON TIP 6IN STRL (MISCELLANEOUS) ×2 IMPLANT
APPLICATOR DR MATTHEWS STRL (MISCELLANEOUS) IMPLANT
BAG FLD CLT MN 6.25X3.5 (WOUND CARE)
BAG MINI COLL DRAIN (WOUND CARE) ×1 IMPLANT
BLADE EYE MINI 60D BEAVER (BLADE) ×2 IMPLANT
BLADE KERATOME 2.75 (BLADE) IMPLANT
BLADE MVR KNIFE 19G (BLADE) ×2 IMPLANT
BLADE STAB KNIFE 15DEG (BLADE) ×2 IMPLANT
CANNULA DUAL BORE 23G (CANNULA) IMPLANT
CANNULA FLEX TIP 23G (CANNULA) ×1 IMPLANT
CLOTH BEACON ORANGE TIMEOUT ST (SAFETY) ×2 IMPLANT
CORDS BIPOLAR (ELECTRODE) IMPLANT
DRAPE OPHTHALMIC 77X100 STRL (CUSTOM PROCEDURE TRAY) ×2 IMPLANT
DRAPE POUCH INSTRU U-SHP 10X18 (DRAPES) ×2 IMPLANT
DRSG TEGADERM 4X4.75 (GAUZE/BANDAGES/DRESSINGS) ×2 IMPLANT
ERASER HMR WETFIELD 23G BP (MISCELLANEOUS) IMPLANT
FILTER BLUE MILLIPORE (MISCELLANEOUS) IMPLANT
GAS OPHTHALMIC (MISCELLANEOUS) IMPLANT
GLOVE BIO SURGEON STRL SZ7 (GLOVE) ×1 IMPLANT
GLOVE SS BIOGEL STRL SZ 6.5 (GLOVE) ×2 IMPLANT
GLOVE SUPERSENSE BIOGEL SZ 6.5 (GLOVE) ×2
GOWN SRG XL XLNG 56XLVL 4 (GOWN DISPOSABLE) ×1 IMPLANT
GOWN STRL NON-REIN LRG LVL3 (GOWN DISPOSABLE) ×2 IMPLANT
GOWN STRL NON-REIN XL XLG LVL4 (GOWN DISPOSABLE) ×2
ILLUMINATOR WIDEFIELD DIFF (MISCELLANEOUS) ×2 IMPLANT
KIT BASIN OR (CUSTOM PROCEDURE TRAY) ×2 IMPLANT
KIT ROOM TURNOVER OR (KITS) ×2 IMPLANT
KNIFE CRESCENT 1.75 EDGEAHEAD (BLADE) ×2 IMPLANT
KNIFE GRIESHABER SHARP 2.5MM (MISCELLANEOUS) ×2 IMPLANT
LENS IOL ACRYSOF MP POST 21.0 (Intraocular Lens) ×2 IMPLANT
MARKER SKIN DUAL TIP RULER LAB (MISCELLANEOUS) IMPLANT
MASK EYE SHIELD (GAUZE/BANDAGES/DRESSINGS) ×1 IMPLANT
NDL 18GX1X1/2 (RX/OR ONLY) (NEEDLE) ×1 IMPLANT
NDL 25GX 5/8IN NON SAFETY (NEEDLE) ×1 IMPLANT
NDL FILTER BLUNT 18X1 1/2 (NEEDLE) ×1 IMPLANT
NDL HYPO 30X.5 LL (NEEDLE) ×2 IMPLANT
NEEDLE 18GX1X1/2 (RX/OR ONLY) (NEEDLE) ×4 IMPLANT
NEEDLE 22X1 1/2 (OR ONLY) (NEEDLE) IMPLANT
NEEDLE 25GX 5/8IN NON SAFETY (NEEDLE) ×2 IMPLANT
NEEDLE FILTER BLUNT 18X 1/2SAF (NEEDLE) ×2
NEEDLE FILTER BLUNT 18X1 1/2 (NEEDLE) ×2 IMPLANT
NEEDLE HYPO 30X.5 LL (NEEDLE) ×4 IMPLANT
NS IRRIG 1000ML POUR BTL (IV SOLUTION) ×2 IMPLANT
PACK COMBINED CATERACT/VIT 23G (OPHTHALMIC RELATED) ×1 IMPLANT
PACK VITRECTOMY CUSTOM (CUSTOM PROCEDURE TRAY) ×2 IMPLANT
PACK VITRECTOMY PIC MCHSVP (PACKS) IMPLANT
PAD ARMBOARD 7.5X6 YLW CONV (MISCELLANEOUS) ×4 IMPLANT
PAD EYE OVAL STERILE LF (GAUZE/BANDAGES/DRESSINGS) ×1 IMPLANT
PAK VITRECTOMY PIK  23GA (OPHTHALMIC RELATED) ×1 IMPLANT
PENCIL BIPOLAR 25GA STR DISP (OPHTHALMIC RELATED) IMPLANT
PHACO TIP KELMAN 45DEG (TIP) IMPLANT
PROBE DIRECTIONAL LASER (MISCELLANEOUS) IMPLANT
PROBE VITREOUS 25+ ACCURUS (MISCELLANEOUS) ×1 IMPLANT
ROLLS DENTAL (MISCELLANEOUS) ×4 IMPLANT
SCRAPER DIAMOND 25GA (OPHTHALMIC RELATED) ×1 IMPLANT
SCRAPER DIAMOND DUST MEMBRANE (MISCELLANEOUS) IMPLANT
SET FLUID INJECTOR (SET/KITS/TRAYS/PACK) IMPLANT
SET VGFI TUBING 8065808002 (SET/KITS/TRAYS/PACK) ×1 IMPLANT
SHUTTLE MONARCH TYPE A (NEEDLE) ×1 IMPLANT
SOLUTION ANTI FOG 6CC (MISCELLANEOUS) ×2 IMPLANT
SPEAR EYE SURG WECK-CEL (MISCELLANEOUS) ×5 IMPLANT
SUT ETHILON 10 0 CS140 6 (SUTURE) ×1 IMPLANT
SUT ETHILON 4 0 P 3 18 (SUTURE) IMPLANT
SUT ETHILON 5 0 P 3 18 (SUTURE)
SUT ETHILON 9 0 TG140 8 (SUTURE) IMPLANT
SUT NYLON 10.0 BLK (SUTURE) IMPLANT
SUT NYLON ETHILON 5-0 P-3 1X18 (SUTURE) IMPLANT
SUT PLAIN 6 0 TG1408 (SUTURE) IMPLANT
SUT POLY NON ABSORB 10-0 8 STR (SUTURE) IMPLANT
SUT VICRYL 7 0 TG140 8 (SUTURE) IMPLANT
SUT VICRYL ABS 6-0 S29 18IN (SUTURE) IMPLANT
SYR 20CC LL (SYRINGE) ×2 IMPLANT
SYR 3ML LL SCALE MARK (SYRINGE) ×1 IMPLANT
SYR 5ML LL (SYRINGE) IMPLANT
SYR TB 1ML LUER SLIP (SYRINGE) ×2 IMPLANT
SYRINGE 10CC LL (SYRINGE) ×1 IMPLANT
TOWEL OR 17X24 6PK STRL BLUE (TOWEL DISPOSABLE) ×6 IMPLANT
TUBE EXTENSION HAMMER (TUBING) ×2 IMPLANT
WATER STERILE IRR 1000ML POUR (IV SOLUTION) ×2 IMPLANT
WIPE INSTRUMENT VISIWIPE 73X73 (MISCELLANEOUS) ×2 IMPLANT

## 2012-03-13 NOTE — Preoperative (Signed)
Beta Blockers   Reason not to administer Beta Blockers:Not Applicable, pt took 11/20

## 2012-03-13 NOTE — Transfer of Care (Signed)
Immediate Anesthesia Transfer of Care Note  Patient: Jerry Farrell  Procedure(s) Performed: Procedure(s) (LRB) with comments: PARS PLANA VITRECTOMY WITH 23 GAUGE (Left) MEMBRANE PEEL (Left) CATARACT EXTRACTION PHACO AND INTRAOCULAR LENS PLACEMENT (IOC) (Left)  Patient Location: PACU  Anesthesia Type:General  Level of Consciousness: awake, alert  and oriented  Airway & Oxygen Therapy: Patient Spontanous Breathing and Patient connected to nasal cannula oxygen  Post-op Assessment: Report given to PACU RN and Post -op Vital signs reviewed and stable  Post vital signs: Reviewed and stable  Complications: No apparent anesthesia complications

## 2012-03-13 NOTE — Op Note (Signed)
HARLEY FITZWATER 03/13/2012 Cataract: Combined, Nuclear  Procedure: Pars Plana Vitrectomy, Membrane Peeling and Fluid Gas Exchange                      Phacoemulsification, Posterior Chamber Intra-ocular Lens Diagnosis: Epiretinal Membrane, Cystoid Macular edema, Cataract Left Eye Operative Eye:  left eye  Surgeon: Shade Flood Estimated Blood Loss: minimal Specimens for Pathology:  None Complications: none  The patient was prepared and draped in the usual manner for ocular surgery on the left eye. A Cook lid speculum was placed. A peripheral clear corneal incision was made at the surgical limbus centered at the 11:00 meridian. A separate clear corneal stab incision was made with a 15 degree blade at the 2:00 meridian to permit bi-manual technique. Viscoat and  Provisc as an underlying layer next to the capsule was instilled into the anterior chamber through that incision.  A keratome was used to create a self sealing incision entering the anterior chamber at the 11:00 meridian. A capsulorhexis was performed using a bent 25g needle. The lens was hydrodissected and the nucleus was hydrodilineated using a Nichammin cannula. The Chang chopper was inserted and used to rotate the lens to insure adequate lens mobility. The phacoemulsification handpiece was inserted and a combined phaco-chop technique was employed, fracturing the lens into separate sections with subsequent removal with the phaco handpiece.   The I/A cannula was used to remove remaining lens cortex. Provisc was instilled and used to deepen the anterior chamber and posterior capsule bag. The Monarch injector was used to place a folded Acrysof [id number] PC IOL, + 21.00  diopters, into the capsule bag. A Sinskey lens hook was used to dial in the trailing haptic.  The I/A cannula was used to remove the viscoelastic from the anterior chamber. BSS was used to bring IOP to the desired range and the wound was checked to insure it was watertight. A 10-0  nylon suture was used to close the corneal incision.  The conjunctiva was displaced with a cotton tipped applicator at the  4:30  meridian. A trocar/cannula was placed 3.5 mm from the surgical limbus. The cannula was visualized in the vitreous cavity. The infusion line was allowed to run and then clamped when placed at the cannula opening. The line was inserted and secured to the drape with an adhesive strip. Trocar/Cannulas were then placed at the 9:30 and 2:30 meridian. The light pipe and vitreous cutter were inserted into the vitreous cavity and the wide field lens was placed. Core vitrectomy was carried out uneventfully with care taken to remove the vitreous up to the vitreous base for 360 degrees.   Total air/Fluid Exchange was carried out with a flex tipped cannula. Four droplets of  ICG dye were placed on the macular surface which was removed after 30 seconds. BSS infusion  was restored in the posterior chamber. A diamond dusted silicone brush was used to elevate the ILM and overlying ERM and the 23g pick forceps were used to peel the ILM in a circumferential manner toward the fovea.   The cannulas were removed from the 9:30 and 2:30 positions with concommitant tamponade using a cotton tipped applicator. Subconjunctival injections of Ancef 100mg /0.51ml and Dexamethasone 4mg /37ml were placed in the infero-medial quadrant to avoid proximity to the cannula sites. Kenalog 40 mg was placed subconjunctivally without complication.  The infusion cannula was removed with concomitant tamponade with the cotton tipped applicator leaving the ocular pressure less than 10 by palpation.  The speculum and drapes were removed and the eye was patched with Polymixin/Bacitracin ophthalmic ointment. An eye shield was placed and the patient was extubated uneventfully with stable vital signs and transferred  to the post operative recovery area.  Shade Flood MD

## 2012-03-13 NOTE — Anesthesia Procedure Notes (Signed)
Procedure Name: LMA Insertion Date/Time: 03/13/2012 3:37 PM Performed by: Elon Alas Pre-anesthesia Checklist: Patient identified, Timeout performed, Emergency Drugs available, Suction available and Patient being monitored Patient Re-evaluated:Patient Re-evaluated prior to inductionOxygen Delivery Method: Circle system utilized Preoxygenation: Pre-oxygenation with 100% oxygen Intubation Type: IV induction Ventilation: Mask ventilation without difficulty LMA: LMA inserted LMA Size: 5.0 Number of attempts: 2 Placement Confirmation: ETT inserted through vocal cords under direct vision,  positive ETCO2 and breath sounds checked- equal and bilateral Tube secured with: Tape Dental Injury: Teeth and Oropharynx as per pre-operative assessment

## 2012-03-13 NOTE — H&P (View-Only) (Signed)
Pre-operative History and Physical for Ophthalmic Surgery  Jerry Farrell 02/12/2012                  Chief Complaint: Decreased vision.  Diagnosis: Epi-retinal Membrane, Cystoid Macular Edema, Combined Cataract Left Eye  Allergies  Allergen Reactions  . Atorvastatin     REACTION: elevated LFT's      Prior to Admission medications   Medication Sig Start Date End Date Taking? Authorizing Provider  aspirin 325 MG EC tablet Take 325 mg by mouth daily.      Historical Provider, MD  atenolol (TENORMIN) 50 MG tablet Take 1 tablet (50 mg total) by mouth daily. 07/20/11 07/19/12  Jerry Pimple, MD  LIPITOR 10 MG tablet Take 1 tablet (10 mg total) by mouth daily. 07/20/11 07/19/12  Jerry Pimple, MD  omeprazole (PRILOSEC) 20 MG capsule Take 1 capsule (20 mg total) by mouth daily. 07/20/11   Jerry Pimple, MD  tadalafil (CIALIS) 20 MG tablet Take one by mouth up to every 3 days as needed before sexual activity.     Historical Provider, MD  valsartan-hydrochlorothiazide (DIOVAN HCT) 80-12.5 MG per tablet Take 1 tablet by mouth daily. 07/20/11 07/19/12  Jerry Pimple, MD    Planned Procedure: Jerry Farrell Vitrectomy and Membrane Peeling                                      Phacoemulsification, Posterior Chamber Intra-ocular Lens Left Eye                                      Acrysof [id number]  + 21. 00 Diopter for implant OS  There were no vitals filed for this visit.  Pulse: 76         Temp: NE        Resp:  16  ROS: negative  Past Medical History  Diagnosis Date  . Transfusion history   . CAD (coronary artery disease)     cardiolite ok 3/05, stress test- low risk study 11/06  . Hyperlipidemia   . Hypertension   . ED (erectile dysfunction)   . Chronic low back pain     with radiculopathy  . Gallstones     abd [place]- stable hamangioma, gallbladder sludge and tiny stones 09/2003    Past Surgical History  Procedure Date  . Gastrectomy     partial  . Coronary artery bypass graft 1998    x 1      History   Social History  . Marital Status: Married    Spouse Name: N/A    Number of Children: 3  . Years of Education: N/A   Occupational History  . Jerry Farrell    Social History Main Topics  . Smoking status: Never Smoker   . Smokeless tobacco: Not on file  . Alcohol Use: Not on file  . Drug Use: Not on file  . Sexually Active: Not on file   Other Topics Concern  . Not on file   Social History Narrative  . No narrative on file     The following examination is for anesthesia clearance for minimally invasive Ophthalmic surgery. It is primarily to document heart and lung findings and is not intended to elucidate unknown general medical conditions inclusive of abdominal masses, lung lesions, etc.  General Constitution:  within normal limits   Alertness/Orientation:  Person, time place     yes   HEENT:  Eye Findings:  Epiretinal Membrane ,  Cystoid Macular Edema ,  Combined Cataract                  left eye  Neck: supple without masses  Chest/Lungs: clear to auscultation  Cardiac: Normal S1 and S2 without Murmur, S3 or S4  Neuro: non-focal  Impression:  Epiretinal Membrane ,  Cystoid Macular Edema ,  Combined Cataract Left Eye   Planned Procedure:  Pars Plana Vitrectomy, Membrane Peeling, Phacoemulsification, Posterior Chamber Intraocular Lens     Jerry Flood, MD

## 2012-03-13 NOTE — Anesthesia Preprocedure Evaluation (Signed)
Anesthesia Evaluation  Patient identified by MRN, date of birth, ID band Patient awake    Reviewed: Allergy & Precautions, H&P , NPO status , Patient's Chart, lab work & pertinent test results, reviewed documented beta blocker date and time   Airway Mallampati: II TM Distance: >3 FB Neck ROM: full    Dental   Pulmonary neg pulmonary ROS,  breath sounds clear to auscultation        Cardiovascular hypertension, On Medications and On Home Beta Blockers + CAD and + CABG Rhythm:regular     Neuro/Psych negative neurological ROS  negative psych ROS   GI/Hepatic negative GI ROS, Neg liver ROS,   Endo/Other  negative endocrine ROS  Renal/GU negative Renal ROS  negative genitourinary   Musculoskeletal   Abdominal   Peds  Hematology negative hematology ROS (+)   Anesthesia Other Findings See surgeon's H&P   Reproductive/Obstetrics negative OB ROS                           Anesthesia Physical Anesthesia Plan  ASA: III  Anesthesia Plan: General   Post-op Pain Management:    Induction: Intravenous  Airway Management Planned: LMA  Additional Equipment:   Intra-op Plan:   Post-operative Plan: Extubation in OR  Informed Consent: I have reviewed the patients History and Physical, chart, labs and discussed the procedure including the risks, benefits and alternatives for the proposed anesthesia with the patient or authorized representative who has indicated his/her understanding and acceptance.   Dental Advisory Given  Plan Discussed with: CRNA and Surgeon  Anesthesia Plan Comments:         Anesthesia Quick Evaluation

## 2012-03-13 NOTE — Anesthesia Postprocedure Evaluation (Signed)
Anesthesia Post Note  Patient: Jerry Farrell  Procedure(s) Performed: Procedure(s) (LRB): PARS PLANA VITRECTOMY WITH 23 GAUGE (Left) MEMBRANE PEEL (Left) CATARACT EXTRACTION PHACO AND INTRAOCULAR LENS PLACEMENT (IOC) (Left)  Anesthesia type: General  Patient location: PACU  Post pain: Pain level controlled and Adequate analgesia  Post assessment: Post-op Vital signs reviewed, Patient's Cardiovascular Status Stable, Respiratory Function Stable, Patent Airway and Pain level controlled  Last Vitals:  Filed Vitals:   03/13/12 1830  BP:   Pulse: 71  Temp:   Resp: 16    Post vital signs: Reviewed and stable  Level of consciousness: awake, alert  and oriented  Complications: No apparent anesthesia complications

## 2012-03-13 NOTE — Interval H&P Note (Signed)
History and Physical Interval Note:  03/13/2012 3:16 PM  Jerry Farrell  has presented today for surgery, with the diagnosis of Epi-retinal Membrane, Cataract Left Eye  The various methods of treatment have been discussed with the patient and family. After consideration of risks, benefits and other options for treatment, the patient has consented to  Procedure(s) (LRB) with comments: PARS PLANA VITRECTOMY WITH 23 GAUGE (Left) as a surgical intervention .  The patient's history has been reviewed, patient examined, no change in status, stable for surgery.  I have reviewed the patient's chart and labs.  Questions were answered to the patient's satisfaction.     Shade Flood, MD

## 2012-03-14 ENCOUNTER — Encounter (HOSPITAL_COMMUNITY): Payer: Self-pay | Admitting: Ophthalmology

## 2012-04-30 ENCOUNTER — Encounter: Payer: Self-pay | Admitting: Family Medicine

## 2012-04-30 ENCOUNTER — Ambulatory Visit (INDEPENDENT_AMBULATORY_CARE_PROVIDER_SITE_OTHER): Payer: Medicare Other | Admitting: Family Medicine

## 2012-04-30 VITALS — BP 126/78 | HR 56 | Temp 98.5°F | Ht 68.0 in | Wt 178.8 lb

## 2012-04-30 DIAGNOSIS — E785 Hyperlipidemia, unspecified: Secondary | ICD-10-CM

## 2012-04-30 DIAGNOSIS — K209 Esophagitis, unspecified without bleeding: Secondary | ICD-10-CM

## 2012-04-30 DIAGNOSIS — I10 Essential (primary) hypertension: Secondary | ICD-10-CM

## 2012-04-30 DIAGNOSIS — Z23 Encounter for immunization: Secondary | ICD-10-CM

## 2012-04-30 LAB — COMPREHENSIVE METABOLIC PANEL
ALT: 27 U/L (ref 0–53)
Albumin: 4 g/dL (ref 3.5–5.2)
Alkaline Phosphatase: 95 U/L (ref 39–117)
Glucose, Bld: 100 mg/dL — ABNORMAL HIGH (ref 70–99)
Potassium: 3.6 mEq/L (ref 3.5–5.1)
Sodium: 137 mEq/L (ref 135–145)
Total Bilirubin: 1.5 mg/dL — ABNORMAL HIGH (ref 0.3–1.2)
Total Protein: 7.1 g/dL (ref 6.0–8.3)

## 2012-04-30 LAB — CBC WITH DIFFERENTIAL/PLATELET
Basophils Absolute: 0.1 10*3/uL (ref 0.0–0.1)
Eosinophils Relative: 0.7 % (ref 0.0–5.0)
Lymphs Abs: 1.5 10*3/uL (ref 0.7–4.0)
MCV: 87.2 fl (ref 78.0–100.0)
Monocytes Absolute: 0.6 10*3/uL (ref 0.1–1.0)
Monocytes Relative: 7.1 % (ref 3.0–12.0)
Neutrophils Relative %: 74.7 % (ref 43.0–77.0)
Platelets: 152 10*3/uL (ref 150.0–400.0)
RDW: 13.7 % (ref 11.5–14.6)
WBC: 8.7 10*3/uL (ref 4.5–10.5)

## 2012-04-30 LAB — LIPID PANEL
HDL: 61.3 mg/dL (ref >39.00)
LDL Cholesterol: 75 mg/dL (ref 0–99)
Total CHOL/HDL Ratio: 3
VLDL: 30.2 mg/dL (ref 0.0–40.0)

## 2012-04-30 LAB — TSH: TSH: 0.7 u[IU]/mL (ref 0.35–5.50)

## 2012-04-30 MED ORDER — VALSARTAN-HYDROCHLOROTHIAZIDE 80-12.5 MG PO TABS: 1.0000 | ORAL_TABLET | Freq: Every day | ORAL | Status: DC

## 2012-04-30 MED ORDER — ATENOLOL 50 MG PO TABS: 50.0000 mg | ORAL_TABLET | Freq: Every day | ORAL | Status: DC

## 2012-04-30 MED ORDER — ATORVASTATIN CALCIUM 10 MG PO TABS: 10.0000 mg | ORAL_TABLET | Freq: Every day | ORAL | Status: DC

## 2012-04-30 MED ORDER — OMEPRAZOLE 20 MG PO CPDR: 20.0000 mg | DELAYED_RELEASE_CAPSULE | Freq: Every day | ORAL | Status: DC | PRN

## 2012-04-30 NOTE — Assessment & Plan Note (Signed)
Pt does take prilosec pretty much every day and symptoms are controlled This was refilled

## 2012-04-30 NOTE — Progress Notes (Signed)
Subjective:    Patient ID: IBAN UTZ, male    DOB: [birth year], 67 y.o.   MRN: [id number]  HPI Here for f/u of chronic medical problems  Has been doing pretty well overall   Had implant put in his L eye - getting used to that Also removal of a wrinkle in the retina/ removed cataracts  Otherwise no changes   Wt is down 1 lb with stable bmi of 27 Has been taking care of himself - but not walking - in several months - due to the weather and eye problem  Feels ready to get back to it  Has also missed his bowling   Flu vaccine-has not had - will get one today Pneumonia vaccine- will get that too   bp is stable today  No cp or palpitations or headaches or edema  No side effects to medicines  BP Readings from Last 3 Encounters:  04/30/12 126/78  03/13/12 142/76  03/13/12 142/76     Hyperlipidemia Diet is fair- pretty close to good , holidays are the exception No trouble with his medicines  Lab Results  Component Value Date   CHOL 145 03/17/2011   HDL 52.20 03/17/2011   LDLCALC 62 03/17/2011   TRIG 154.0* 03/17/2011   CHOLHDL 3 03/17/2011    Due for a check on this and also liver fxn  Lab Results  Component Value Date   ALT 21 03/17/2011   AST 23 03/17/2011   ALKPHOS 113 03/17/2011   BILITOT 1.2 03/17/2011    No falls in past year Mood is very good   Patient Active Problem List  Diagnosis  . HYPERLIPIDEMIA  . ERECTILE DYSFUNCTION  . HYPERTENSION  . CORONARY ARTERY DISEASE  . ESOPHAGITIS  . BACK PAIN, LUMBAR, WITH RADICULOPATHY  . TRANSAMINASES, SERUM, ELEVATED   Past Medical History  Diagnosis Date  . Transfusion history   . CAD (coronary artery disease)     cardiolite ok 3/05, stress test- low risk study 11/06  . Hyperlipidemia   . Hypertension   . ED (erectile dysfunction)   . Chronic low back pain     with radiculopathy  . Gallstones     abd [place]- stable hamangioma, gallbladder sludge and tiny stones 09/2003  . GERD (gastroesophageal reflux disease)     Past Surgical History  Procedure Date  . Gastrectomy     partial  . Coronary artery bypass graft 1998    x 1  done here at [facility]  . Pars plana vitrectomy 03/13/2012    Procedure: PARS PLANA VITRECTOMY WITH 23 GAUGE;  Surgeon: Shade Flood, MD;  Location: [facility] OR;  Service: Ophthalmology;  Laterality: Left;  Marland Kitchen Membrane peel 03/13/2012    Procedure: MEMBRANE PEEL;  Surgeon: Shade Flood, MD;  Location: [facility] OR;  Service: Ophthalmology;  Laterality: Left;  . Cataract extraction w/phaco 03/13/2012    Procedure: CATARACT EXTRACTION PHACO AND INTRAOCULAR LENS PLACEMENT (IOC);  Surgeon: Shade Flood, MD;  Location: [facility] OR;  Service: Ophthalmology;  Laterality: Left;   History  Substance Use Topics  . Smoking status: Never Smoker   . Smokeless tobacco: Not on file  . Alcohol Use: No   Family History  Problem Relation Age of Onset  . Hypertension Mother   . Cancer Sister     unknown type, pt feels alcohol related  . Cancer Brother     colon  . Cancer Brother     throat, pt feels alcohol related   Allergies  Allergen Reactions  .  Atorvastatin     REACTION: elevated LFT's   Current Outpatient Prescriptions on File Prior to Visit  Medication Sig Dispense Refill  . aspirin 325 MG EC tablet Take 325 mg by mouth daily.      Marland Kitchen atenolol (TENORMIN) 50 MG tablet Take 1 tablet (50 mg total) by mouth daily.  90 tablet  3  . atorvastatin (LIPITOR) 10 MG tablet Take 10 mg by mouth daily.      Marland Kitchen omeprazole (PRILOSEC) 20 MG capsule Take 20 mg by mouth daily as needed. For reflux      . tadalafil (CIALIS) 20 MG tablet Take one by mouth up to every 3 days as needed before sexual activity.       . valsartan-hydrochlorothiazide (DIOVAN HCT) 80-12.5 MG per tablet Take 1 tablet by mouth daily.  90 tablet  3      Review of Systems Review of Systems  Constitutional: Negative for fever, appetite change, fatigue and unexpected weight change.  ENT pos for rhinorrhea- getting over a cold  Eyes: Negative  for pain and pos for hazy vision in healing eye  Respiratory: Negative for cough and shortness of breath.   Cardiovascular: Negative for cp or palpitations    Gastrointestinal: Negative for nausea, diarrhea and constipation.  Genitourinary: Negative for urgency and frequency.  Skin: Negative for pallor or rash   Neurological: Negative for weakness, light-headedness, numbness and headaches.  Hematological: Negative for adenopathy. Does not bruise/bleed easily.  Psychiatric/Behavioral: Negative for dysphoric mood. The patient is not nervous/anxious.         Objective:   Physical Exam  Constitutional: He appears well-developed and well-nourished. No distress.  HENT:  Head: Normocephalic and atraumatic.  Right Ear: External ear normal.  Left Ear: External ear normal.  Mouth/Throat: Oropharynx is clear and moist.  Eyes: Conjunctivae normal and EOM are normal. Pupils are equal, round, and reactive to light. No scleral icterus.  Neck: Normal range of motion. Neck supple. No JVD present. Carotid bruit is not present. No thyromegaly present.  Cardiovascular: Normal rate, regular rhythm, normal heart sounds and intact distal pulses.  Exam reveals no gallop.   Pulmonary/Chest: Effort normal and breath sounds normal. No respiratory distress. He has no wheezes.  Abdominal: Soft. Bowel sounds are normal. He exhibits no distension, no abdominal bruit and no mass. There is no tenderness.  Musculoskeletal: He exhibits no edema and no tenderness.  Lymphadenopathy:    He has no cervical adenopathy.  Neurological: He is alert. He has normal reflexes. No cranial nerve deficit. He exhibits normal muscle tone. Coordination normal.  Skin: Skin is warm and dry. No rash noted. No erythema. No pallor.  Psychiatric: He has a normal mood and affect.          Assessment & Plan:

## 2012-04-30 NOTE — Assessment & Plan Note (Signed)
bp in fair control at this time  No changes needed  Disc lifstyle change with low sodium diet and exercise  Labs today F/u 6 mo

## 2012-04-30 NOTE — Assessment & Plan Note (Signed)
Lipids today Disc goals for lipids and reasons to control them Rev labs with pt from last time  Rev low sat fat diet in detail  Refilled lipitor- also watching transaminases

## 2012-04-30 NOTE — Patient Instructions (Addendum)
Flu and pneumonia vaccines today Labs today Keep taking care of yourself- get back to walking  Avoid red meat/ fried foods/ egg yolks/ fatty breakfast meats/ butter, cheese and high fat dairy/ and shellfish   Follow up in 6 months for annual exam with labs prior

## 2012-05-01 ENCOUNTER — Encounter: Payer: Self-pay | Admitting: *Deleted

## 2012-08-21 ENCOUNTER — Other Ambulatory Visit: Payer: Medicare Other

## 2012-08-28 ENCOUNTER — Encounter: Payer: Medicare Other | Admitting: Family Medicine

## 2012-11-05 ENCOUNTER — Encounter: Payer: Self-pay | Admitting: Family Medicine

## 2013-04-08 ENCOUNTER — Encounter: Payer: Self-pay | Admitting: Family Medicine

## 2013-04-08 ENCOUNTER — Ambulatory Visit (INDEPENDENT_AMBULATORY_CARE_PROVIDER_SITE_OTHER): Payer: Medicare Other | Admitting: Family Medicine

## 2013-04-08 VITALS — BP 135/80 | HR 52 | Temp 98.1°F | Ht 68.0 in | Wt 184.5 lb

## 2013-04-08 DIAGNOSIS — E785 Hyperlipidemia, unspecified: Secondary | ICD-10-CM

## 2013-04-08 DIAGNOSIS — Z23 Encounter for immunization: Secondary | ICD-10-CM

## 2013-04-08 DIAGNOSIS — I10 Essential (primary) hypertension: Secondary | ICD-10-CM

## 2013-04-08 LAB — CBC WITH DIFFERENTIAL/PLATELET
Eosinophils Absolute: 0.1 10*3/uL (ref 0.0–0.7)
Eosinophils Relative: 1.1 % (ref 0.0–5.0)
HCT: 46.3 % (ref 39.0–52.0)
Lymphs Abs: 1.5 10*3/uL (ref 0.7–4.0)
MCHC: 33.1 g/dL (ref 30.0–36.0)
MCV: 87.6 fl (ref 78.0–100.0)
Monocytes Absolute: 1.1 10*3/uL — ABNORMAL HIGH (ref 0.1–1.0)
Neutrophils Relative %: 74.9 % (ref 43.0–77.0)
Platelets: 177 10*3/uL (ref 150.0–400.0)
WBC: 10.9 10*3/uL — ABNORMAL HIGH (ref 4.5–10.5)

## 2013-04-08 MED ORDER — OMEPRAZOLE 20 MG PO CPDR: 20.0000 mg | DELAYED_RELEASE_CAPSULE | Freq: Every day | ORAL | Status: DC | PRN

## 2013-04-08 MED ORDER — ATENOLOL 50 MG PO TABS: 50.0000 mg | ORAL_TABLET | Freq: Every day | ORAL | Status: DC

## 2013-04-08 MED ORDER — VALSARTAN-HYDROCHLOROTHIAZIDE 80-12.5 MG PO TABS: 1.0000 | ORAL_TABLET | Freq: Every day | ORAL | Status: DC

## 2013-04-08 MED ORDER — ATORVASTATIN CALCIUM 10 MG PO TABS: 10.0000 mg | ORAL_TABLET | Freq: Every day | ORAL | Status: DC

## 2013-04-08 NOTE — Progress Notes (Signed)
   Subjective:    Patient ID: Jerry Farrell, male    DOB: 06-21-1945, 67 y.o.   MRN: [id number]  HPI Here for f/u of chronic medical problems  He had eye surgery for a retinal problem- still having problems  Had swelling and pressure in the eyes -- still cannot see well from that eye  tx for glaucoma  Then also had a cataract and had that taken care of   Otherwise doing well and taking care of himself   Wt is up 6 lb with bmi of 28 He eats fairly healthy  Has not exercised as much - due to weather and other issues , and holiday  Flu vaccine -- just got it today   Zoster status - unsure if he can afford vaccine-will check with his insurance   bp is stable today - better on 2nd check  No cp or palpitations or headaches or edema  No side effects to medicines  BP Readings from Last 3 Encounters:  04/08/13 142/80  04/30/12 126/78  03/13/12 142/76      Hyperlipidemia  Lab Results  Component Value Date   CHOL 166 04/30/2012   HDL 61.30 04/30/2012   LDLCALC 75 04/30/2012   TRIG 151.0* 04/30/2012   CHOLHDL 3 04/30/2012    Diet is pretty good  On lipitor as well     Review of Systems Review of Systems  Constitutional: Negative for fever, appetite change, fatigue and unexpected weight change.  Eyes: Negative for pain and visual disturbance.  Respiratory: Negative for cough and shortness of breath.   Cardiovascular: Negative for cp or palpitations    Gastrointestinal: Negative for nausea, diarrhea and constipation.  Genitourinary: Negative for urgency and frequency.  Skin: Negative for pallor or rash   Neurological: Negative for weakness, light-headedness, numbness and headaches.  Hematological: Negative for adenopathy. Does not bruise/bleed easily.  Psychiatric/Behavioral: Negative for dysphoric mood. The patient is not nervous/anxious.         Objective:   Physical Exam  Constitutional: He appears well-developed and well-nourished. No distress.  HENT:  Head: Normocephalic  and atraumatic.  Nose: Nose normal.  Mouth/Throat: Oropharynx is clear and moist.  Eyes: Conjunctivae and EOM are normal. Pupils are equal, round, and reactive to light. Right eye exhibits no discharge. Left eye exhibits no discharge. No scleral icterus.  Neck: Normal range of motion. Neck supple. No JVD present. Carotid bruit is not present. No thyromegaly present.  Cardiovascular: Normal rate, regular rhythm, normal heart sounds and intact distal pulses.  Exam reveals no gallop.   Pulmonary/Chest: Effort normal and breath sounds normal. No respiratory distress. He has no wheezes. He exhibits no tenderness.  Abdominal: Soft. Bowel sounds are normal. He exhibits no distension, no abdominal bruit and no mass. There is no tenderness.  Musculoskeletal: He exhibits no edema and no tenderness.  Lymphadenopathy:    He has no cervical adenopathy.  Neurological: He is alert. He has normal reflexes. No cranial nerve deficit. He exhibits normal muscle tone. Coordination normal.  Skin: Skin is warm and dry. No rash noted. No erythema. No pallor.  Psychiatric: He has a normal mood and affect.          Assessment & Plan:

## 2013-04-08 NOTE — Patient Instructions (Signed)
Avoid red meat/ fried foods/ egg yolks/ fatty breakfast meats/ butter, cheese and high fat dairy/ and shellfish   If you are interested in a shingles/zoster vaccine - call your insurance to check on coverage,( you should not get it within 1 month of other vaccines) , then call us for a prescription  for it to take to a pharmacy that gives the shot , or make a nurse visit to get it here depending on your coverage  you had a flu vaccine today  Labs today  Call your insurance co. To see if they cover an equivalent to valsartan- hctz for a cheaper price

## 2013-04-08 NOTE — Progress Notes (Signed)
Pre-visit discussion using our clinic review tool. No additional management support is needed unless otherwise documented below in the visit note.  

## 2013-04-09 LAB — LIPID PANEL
Cholesterol: 141 mg/dL (ref 0–200)
HDL: 44.2 mg/dL
LDL Cholesterol: 63 mg/dL (ref 0–99)
Total CHOL/HDL Ratio: 3
Triglycerides: 167 mg/dL — ABNORMAL HIGH (ref 0.0–149.0)
VLDL: 33.4 mg/dL (ref 0.0–40.0)

## 2013-04-09 LAB — COMPREHENSIVE METABOLIC PANEL WITH GFR
ALT: 29 U/L (ref 0–53)
AST: 28 U/L (ref 0–37)
Albumin: 4.4 g/dL (ref 3.5–5.2)
Alkaline Phosphatase: 119 U/L — ABNORMAL HIGH (ref 39–117)
BUN: 14 mg/dL (ref 6–23)
CO2: 29 meq/L (ref 19–32)
Calcium: 8.8 mg/dL (ref 8.4–10.5)
Chloride: 103 meq/L (ref 96–112)
Creatinine, Ser: 1 mg/dL (ref 0.4–1.5)
GFR: 97.31 mL/min
Glucose, Bld: 85 mg/dL (ref 70–99)
Potassium: 4.1 meq/L (ref 3.5–5.1)
Sodium: 139 meq/L (ref 135–145)
Total Bilirubin: 1.1 mg/dL (ref 0.3–1.2)
Total Protein: 7 g/dL (ref 6.0–8.3)

## 2013-04-09 LAB — TSH: TSH: 0.42 u[IU]/mL (ref 0.35–5.50)

## 2013-04-09 NOTE — Assessment & Plan Note (Signed)
BP: 135/80 mmHg   bp in fair control at this time  No changes needed Disc lifstyle change with low sodium diet and exercise   Lab today

## 2013-04-09 NOTE — Assessment & Plan Note (Signed)
On statin and diet Disc goals for lipids and reasons to control them Rev labs with pt from last draw Rev low sat fat diet in detail  Lab today

## 2013-04-15 ENCOUNTER — Encounter: Payer: Self-pay | Admitting: *Deleted

## 2013-04-16 IMAGING — CR DG CHEST 2V
2 series · 2 of 2 positions shown · non-contrast
Comparison: None.

CLINICAL DATA: Hypertension

CHEST - 2 VIEW

[view not recorded (1 of 2)]
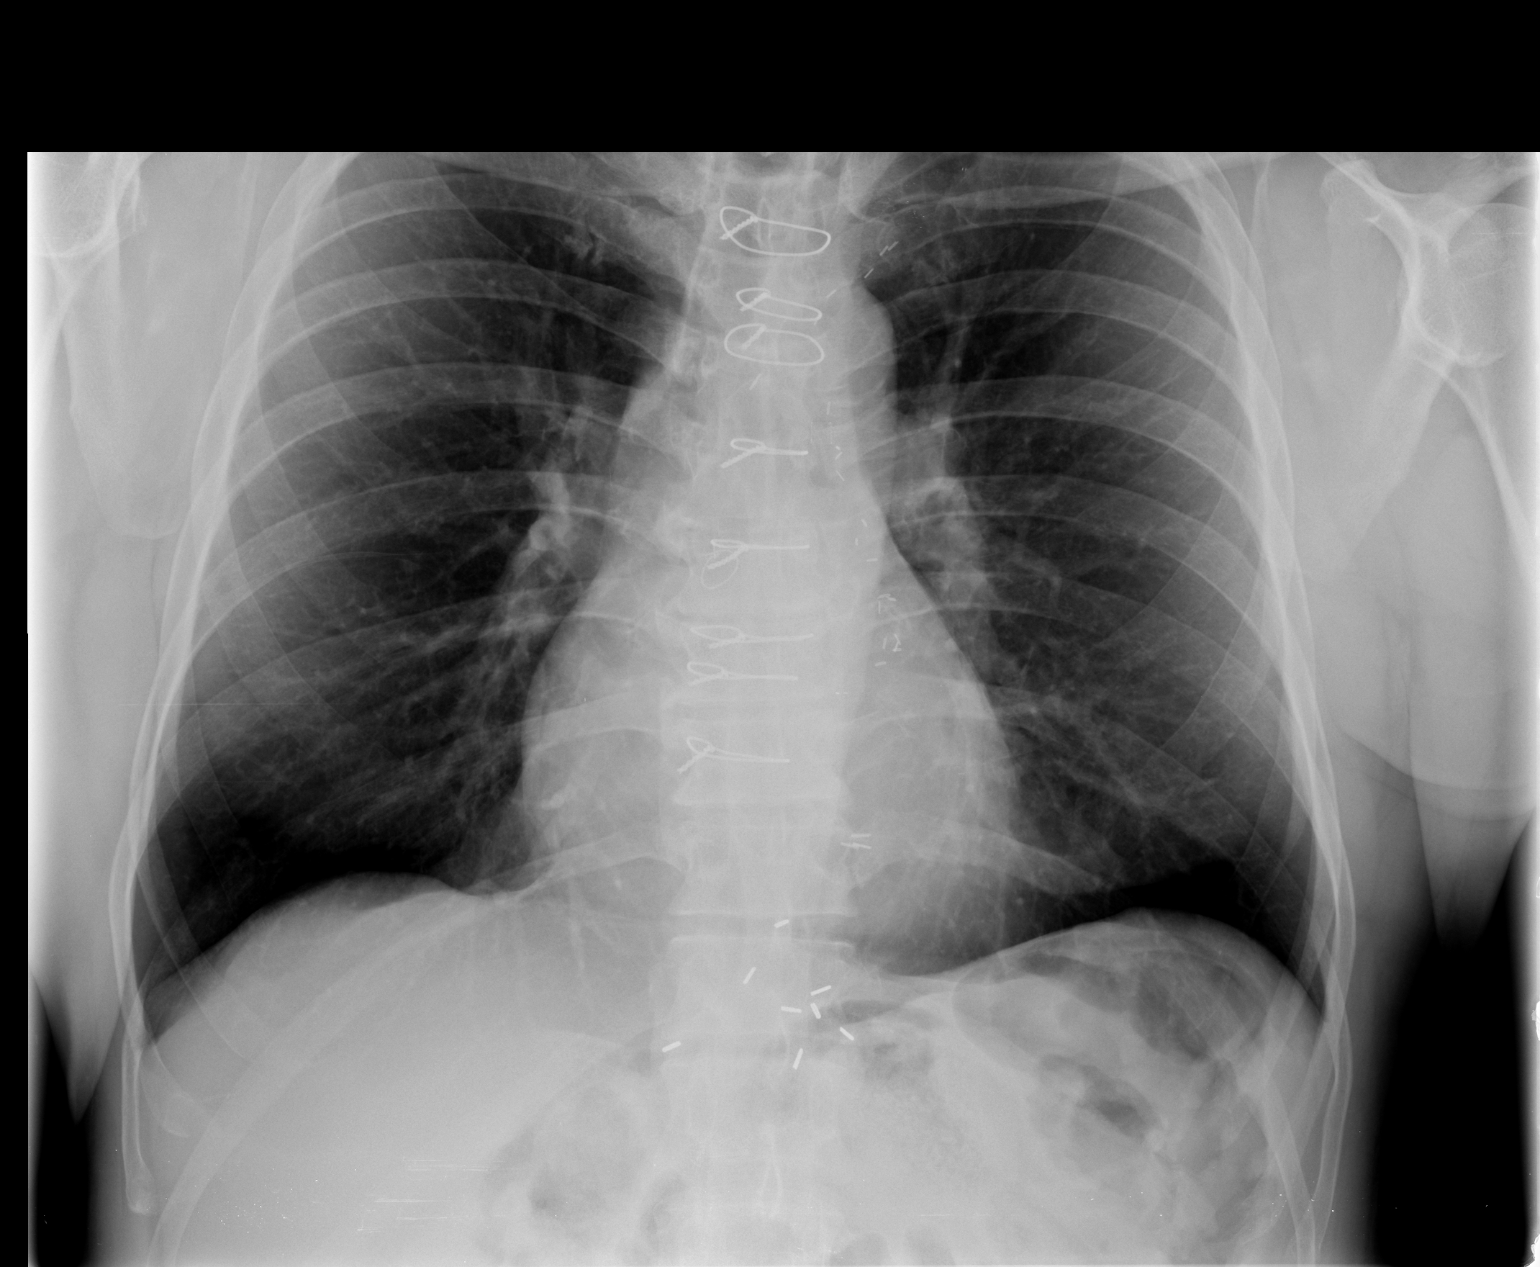

[view not recorded (2 of 2)]
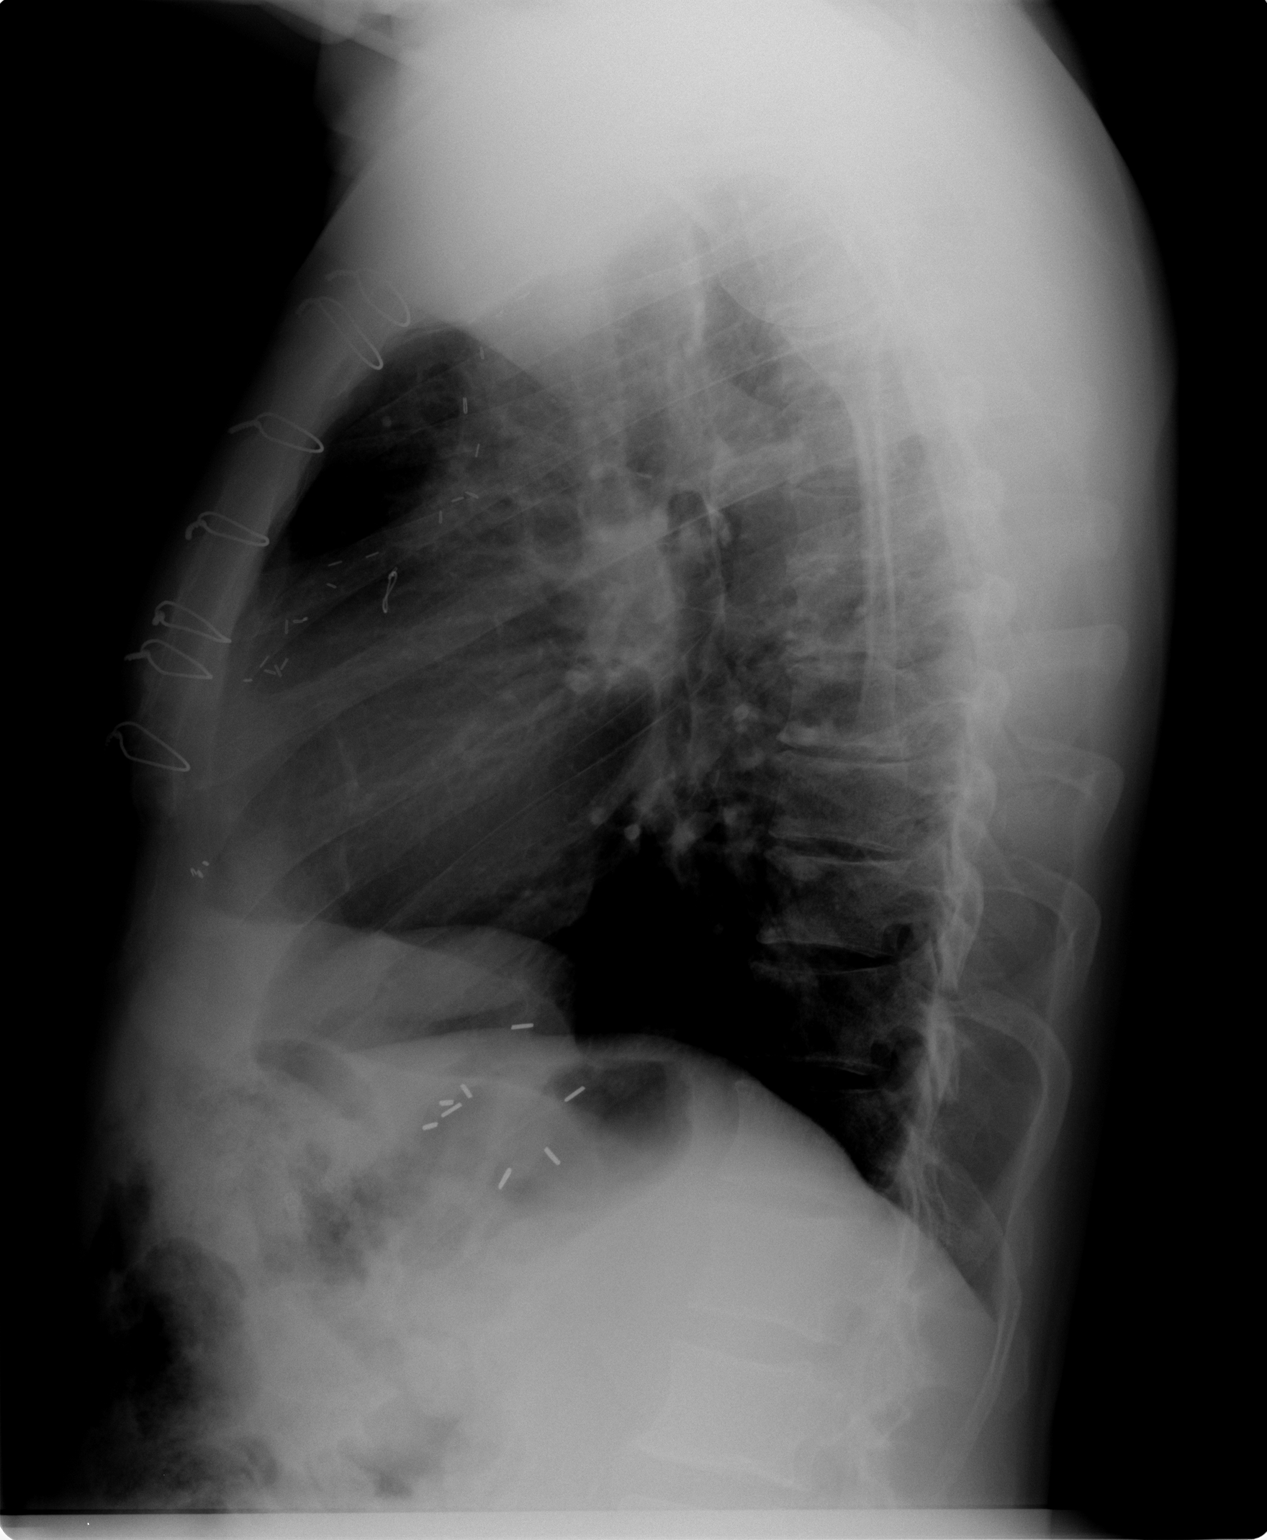

[2 of 2 positions shown; findings below may reference images not displayed]

FINDINGS: The heart and pulmonary vascularity are within normal
limits.  The lungs are clear.  Postsurgical changes are noted.  No
acute bony abnormality is seen.
IMPRESSION: No acute abnormality noted.

## 2013-04-22 ENCOUNTER — Other Ambulatory Visit: Payer: Self-pay | Admitting: Family Medicine

## 2013-05-01 ENCOUNTER — Other Ambulatory Visit: Payer: Self-pay | Admitting: Family Medicine

## 2013-05-01 DIAGNOSIS — D72829 Elevated white blood cell count, unspecified: Secondary | ICD-10-CM

## 2013-05-13 ENCOUNTER — Other Ambulatory Visit (INDEPENDENT_AMBULATORY_CARE_PROVIDER_SITE_OTHER): Payer: Medicare Other

## 2013-05-13 DIAGNOSIS — D72829 Elevated white blood cell count, unspecified: Secondary | ICD-10-CM

## 2013-05-13 LAB — CBC WITH DIFFERENTIAL/PLATELET
BASOS ABS: 0 10*3/uL (ref 0.0–0.1)
Basophils Relative: 0.5 % (ref 0.0–3.0)
EOS ABS: 0.1 10*3/uL (ref 0.0–0.7)
Eosinophils Relative: 1 % (ref 0.0–5.0)
HEMATOCRIT: 44.7 % (ref 39.0–52.0)
Hemoglobin: 15 g/dL (ref 13.0–17.0)
LYMPHS ABS: 1.5 10*3/uL (ref 0.7–4.0)
Lymphocytes Relative: 19.8 % (ref 12.0–46.0)
MCHC: 33.7 g/dL (ref 30.0–36.0)
MCV: 86.8 fl (ref 78.0–100.0)
MONO ABS: 0.6 10*3/uL (ref 0.1–1.0)
MONOS PCT: 8 % (ref 3.0–12.0)
Neutro Abs: 5.4 10*3/uL (ref 1.4–7.7)
Neutrophils Relative %: 70.7 % (ref 43.0–77.0)
PLATELETS: 148 10*3/uL — AB (ref 150.0–400.0)
RBC: 5.15 Mil/uL (ref 4.22–5.81)
RDW: 13.6 % (ref 11.5–14.6)
WBC: 7.6 10*3/uL (ref 4.5–10.5)

## 2013-06-25 ENCOUNTER — Encounter: Payer: Self-pay | Admitting: Gastroenterology

## 2013-06-25 ENCOUNTER — Ambulatory Visit (INDEPENDENT_AMBULATORY_CARE_PROVIDER_SITE_OTHER): Payer: Medicare Other | Admitting: Gastroenterology

## 2013-06-25 VITALS — BP 132/80 | HR 58 | Ht 68.0 in | Wt 177.0 lb

## 2013-06-25 DIAGNOSIS — K648 Other hemorrhoids: Secondary | ICD-10-CM

## 2013-06-25 DIAGNOSIS — K589 Irritable bowel syndrome without diarrhea: Secondary | ICD-10-CM

## 2013-06-25 MED ORDER — DICYCLOMINE HCL 10 MG PO CAPS: 10.0000 mg | ORAL_CAPSULE | Freq: Three times a day (TID) | ORAL | Status: DC | PRN

## 2013-06-25 NOTE — Patient Instructions (Signed)
We have sent the following medications to your pharmacy for you to pick up at your convenience: Bentyl.  RECTAL CARE INSTRUCTIONS:  1. Sitz Baths twice a day for 10 minutes each. 2. Thoroughly clean and dry the rectum. 3. Put Tucks pad against the rectum at night. 4. Clean the rectum with Balenol lotion after each bowel movement.  High-Fiber Diet Fiber is found in fruits, vegetables, and grains. A high-fiber diet encourages the addition of more whole grains, legumes, fruits, and vegetables in your diet. The recommended amount of fiber for adult males is 38 g per day. For adult females, it is 25 g per day. Pregnant and lactating women should get 28 g of fiber per day. If you have a digestive or bowel problem, ask your caregiver for advice before adding high-fiber foods to your diet. Eat a variety of high-fiber foods instead of only a select few type of foods.  PURPOSE  To increase stool bulk.  To make bowel movements more regular to prevent constipation.  To lower cholesterol.  To prevent overeating. WHEN IS THIS DIET USED?  It may be used if you have constipation and hemorrhoids.  It may be used if you have uncomplicated diverticulosis (intestine condition) and irritable bowel syndrome.  It may be used if you need help with weight management.  It may be used if you want to add it to your diet as a protective measure against atherosclerosis, diabetes, and cancer. SOURCES OF FIBER  Whole-grain breads and cereals.  Fruits, such as apples, oranges, bananas, berries, prunes, and pears.  Vegetables, such as green peas, carrots, sweet potatoes, beets, broccoli, cabbage, spinach, and artichokes.  Legumes, such split peas, soy, lentils.  Almonds. FIBER CONTENT IN FOODS Starches and Grains / Dietary Fiber (g)  Cheerios, 1 cup / 3 g  Corn Flakes cereal, 1 cup / 0.7 g  Rice crispy treat cereal, 1 cup / 0.3 g  Instant oatmeal (cooked),  cup / 2 g  Frosted wheat cereal, 1 cup /  5.1 g  Brown, long-grain rice (cooked), 1 cup / 3.5 g  White, long-grain rice (cooked), 1 cup / 0.6 g  Enriched macaroni (cooked), 1 cup / 2.5 g Legumes / Dietary Fiber (g)  Baked beans (canned, plain, or vegetarian),  cup / 5.2 g  Kidney beans (canned),  cup / 6.8 g  Pinto beans (cooked),  cup / 5.5 g Breads and Crackers / Dietary Fiber (g)  Plain or honey graham crackers, 2 squares / 0.7 g  Saltine crackers, 3 squares / 0.3 g  Plain, salted pretzels, 10 pieces / 1.8 g  Whole-wheat bread, 1 slice / 1.9 g  White bread, 1 slice / 0.7 g  Raisin bread, 1 slice / 1.2 g  Plain bagel, 3 oz / 2 g  Flour tortilla, 1 oz / 0.9 g  Corn tortilla, 1 small / 1.5 g  Hamburger or hotdog bun, 1 small / 0.9 g Fruits / Dietary Fiber (g)  Apple with skin, 1 medium / 4.4 g  Sweetened applesauce,  cup / 1.5 g  Banana,  medium / 1.5 g  Grapes, 10 grapes / 0.4 g  Orange, 1 small / 2.3 g  Raisin, 1.5 oz / 1.6 g  Melon, 1 cup / 1.4 g Vegetables / Dietary Fiber (g)  Green beans (canned),  cup / 1.3 g  Carrots (cooked),  cup / 2.3 g  Broccoli (cooked),  cup / 2.8 g  Peas (cooked),  cup / 4.4 g  Mashed potatoes,  cup / 1.6 g  Lettuce, 1 cup / 0.5 g  Corn (canned),  cup / 1.6 g  Tomato,  cup / 1.1 g Document Released: 04/10/2005 Document Revised: 10/10/2011 Document Reviewed: 07/13/2011 [facility] Patient Information 2014 [place], [place].  Thank you for choosing me and [facility].  Pricilla Riffle. Dagoberto Ligas., MD., Marval Regal

## 2013-06-25 NOTE — Progress Notes (Signed)
    History of Present Illness: This is a 68 year old male who relates occasional mild lower abdominal crampy pain in relation to high fat greasy foods. He relates occasional mild constipation. He notes occasional rectal swelling and discomfort that responds Preparation H. Colonoscopy in June 2009 was unremarkable except for internal hemorrhoids. Denies weight loss, diarrhea, change in stool caliber, melena, hematochezia, nausea, vomiting, dysphagia, reflux symptoms, chest pain.  Review of Systems: Pertinent positive and negative review of systems were noted in the above HPI section. All other review of systems were otherwise negative.  Current Medications, Allergies, Past Medical History, Past Surgical History, Family History and Social History were reviewed in [employer] record.  Physical Exam: General: Well developed , well nourished, no acute distress Head: Normocephalic and atraumatic Eyes:  sclerae anicteric, EOMI Ears: Normal auditory acuity Mouth: No deformity or lesions Neck: Supple, no masses or thyromegaly Lungs: Clear throughout to auscultation Heart: Regular rate and rhythm; no murmurs, rubs or bruits Abdomen: Soft, non tender and non distended. No masses, hepatosplenomegaly or hernias noted. Normal Bowel sounds Rectal: No external or internal lesions, no tenderness, Hemoccult negative brown stool the vault Musculoskeletal: Symmetrical with no gross deformities  Skin: No lesions on visible extremities Pulses:  Normal pulses noted Extremities: No clubbing, cyanosis, edema or deformities noted Neurological: Alert oriented x 4, grossly nonfocal Cervical Nodes:  No significant cervical adenopathy Inguinal Nodes: No significant inguinal adenopathy Psychological:  Alert and cooperative. Normal mood and affect  Assessment and Recommendations:  1. Intermittent lower abd pain and mild constipation. Presumed IBS. Avoid high fat and other trigger foods. High  fiber diet with increased daily water intake. Dicyclomine 10 mg po tid as needed.   2. Internal hemorrhoids. Prep H supp daily as needed. High fiber diet and increased water intake.

## 2013-08-11 ENCOUNTER — Telehealth: Payer: Self-pay | Admitting: Family Medicine

## 2013-08-11 DIAGNOSIS — I10 Essential (primary) hypertension: Secondary | ICD-10-CM

## 2013-08-11 DIAGNOSIS — Z125 Encounter for screening for malignant neoplasm of prostate: Secondary | ICD-10-CM

## 2013-08-11 DIAGNOSIS — E785 Hyperlipidemia, unspecified: Secondary | ICD-10-CM

## 2013-08-11 DIAGNOSIS — Z0000 Encounter for general adult medical examination without abnormal findings: Secondary | ICD-10-CM

## 2013-08-11 NOTE — Telephone Encounter (Signed)
Message copied by Abner Greenspan on Mon Aug 11, 2013  9:54 PM ------      Message from: Ellamae Sia      Created: Wed Aug 06, 2013  3:42 PM      Regarding: Lab orders for Tuesday, 4.21.15       Patient is scheduled for CPX labs, please order future labs, Thanks , Terri       ------

## 2013-08-12 ENCOUNTER — Other Ambulatory Visit (INDEPENDENT_AMBULATORY_CARE_PROVIDER_SITE_OTHER): Payer: Medicare Other

## 2013-08-12 DIAGNOSIS — E785 Hyperlipidemia, unspecified: Secondary | ICD-10-CM

## 2013-08-12 DIAGNOSIS — Z125 Encounter for screening for malignant neoplasm of prostate: Secondary | ICD-10-CM

## 2013-08-12 DIAGNOSIS — I10 Essential (primary) hypertension: Secondary | ICD-10-CM

## 2013-08-12 DIAGNOSIS — R7402 Elevation of levels of lactic acid dehydrogenase (LDH): Secondary | ICD-10-CM

## 2013-08-12 DIAGNOSIS — R740 Nonspecific elevation of levels of transaminase and lactic acid dehydrogenase [LDH]: Secondary | ICD-10-CM

## 2013-08-12 LAB — CBC WITH DIFFERENTIAL/PLATELET
BASOS PCT: 0.5 % (ref 0.0–3.0)
Basophils Absolute: 0 10*3/uL (ref 0.0–0.1)
EOS ABS: 0.1 10*3/uL (ref 0.0–0.7)
EOS PCT: 1.4 % (ref 0.0–5.0)
HCT: 46.2 % (ref 39.0–52.0)
HEMOGLOBIN: 15.5 g/dL (ref 13.0–17.0)
LYMPHS PCT: 21.5 % (ref 12.0–46.0)
Lymphs Abs: 1.7 10*3/uL (ref 0.7–4.0)
MCHC: 33.6 g/dL (ref 30.0–36.0)
MCV: 88 fl (ref 78.0–100.0)
Monocytes Absolute: 0.6 10*3/uL (ref 0.1–1.0)
Monocytes Relative: 7.3 % (ref 3.0–12.0)
NEUTROS ABS: 5.5 10*3/uL (ref 1.4–7.7)
Neutrophils Relative %: 69.3 % (ref 43.0–77.0)
Platelets: 152 10*3/uL (ref 150.0–400.0)
RBC: 5.25 Mil/uL (ref 4.22–5.81)
RDW: 13.4 % (ref 11.5–14.6)
WBC: 7.9 10*3/uL (ref 4.5–10.5)

## 2013-08-12 LAB — COMPREHENSIVE METABOLIC PANEL
ALBUMIN: 3.9 g/dL (ref 3.5–5.2)
ALT: 18 U/L (ref 0–53)
AST: 20 U/L (ref 0–37)
Alkaline Phosphatase: 112 U/L (ref 39–117)
BUN: 13 mg/dL (ref 6–23)
CALCIUM: 9.3 mg/dL (ref 8.4–10.5)
CHLORIDE: 103 meq/L (ref 96–112)
CO2: 31 meq/L (ref 19–32)
CREATININE: 1 mg/dL (ref 0.4–1.5)
GFR: 94.98 mL/min (ref >60.00)
GLUCOSE: 104 mg/dL — AB (ref 70–99)
Potassium: 4 mEq/L (ref 3.5–5.1)
Sodium: 139 mEq/L (ref 135–145)
Total Bilirubin: 1.1 mg/dL (ref 0.3–1.2)
Total Protein: 6.7 g/dL (ref 6.0–8.3)

## 2013-08-12 LAB — LIPID PANEL
CHOLESTEROL: 138 mg/dL (ref 0–200)
HDL: 49.4 mg/dL (ref >39.00)
LDL Cholesterol: 67 mg/dL (ref 0–99)
Total CHOL/HDL Ratio: 3
Triglycerides: 108 mg/dL (ref 0.0–149.0)
VLDL: 21.6 mg/dL (ref 0.0–40.0)

## 2013-08-12 LAB — TSH: TSH: 0.56 u[IU]/mL (ref 0.35–5.50)

## 2013-08-12 LAB — PSA: PSA: 1.41 ng/mL (ref 0.10–4.00)

## 2013-08-19 ENCOUNTER — Encounter: Payer: Self-pay | Admitting: Family Medicine

## 2013-08-19 ENCOUNTER — Ambulatory Visit (INDEPENDENT_AMBULATORY_CARE_PROVIDER_SITE_OTHER): Payer: Medicare Other | Admitting: Family Medicine

## 2013-08-19 VITALS — BP 140/84 | HR 50 | Temp 98.2°F | Ht 68.0 in | Wt 183.2 lb

## 2013-08-19 DIAGNOSIS — Z2911 Encounter for prophylactic immunotherapy for respiratory syncytial virus (RSV): Secondary | ICD-10-CM

## 2013-08-19 DIAGNOSIS — Z0000 Encounter for general adult medical examination without abnormal findings: Secondary | ICD-10-CM

## 2013-08-19 DIAGNOSIS — I10 Essential (primary) hypertension: Secondary | ICD-10-CM

## 2013-08-19 DIAGNOSIS — E785 Hyperlipidemia, unspecified: Secondary | ICD-10-CM

## 2013-08-19 DIAGNOSIS — Z23 Encounter for immunization: Secondary | ICD-10-CM

## 2013-08-19 DIAGNOSIS — Z125 Encounter for screening for malignant neoplasm of prostate: Secondary | ICD-10-CM

## 2013-08-19 NOTE — Progress Notes (Signed)
Subjective:    Patient ID: Jerry Farrell, male    DOB: 09-04-1945, 68 y.o.   MRN: [id number]  HPI I have personally reviewed the Medicare Annual Wellness questionnaire and have noted 1. The patient's medical and social history 2. Their use of alcohol, tobacco or illicit drugs 3. Their current medications and supplements 4. The patient's functional ability including ADL's, fall risks, home safety risks and hearing or visual             impairment. 5. Diet and physical activities 6. Evidence for depression or mood disorders  The patients weight, height, BMI have been recorded in the chart and visual acuity is per eye clinic.  I have made referrals, counseling and provided education to the patient based review of the above and I have provided the pt with a written personalized care plan for preventive services.  Doing ok overall  A little pain in L low back and side - about a week at most  Never had a kidney stone in the past  No blood in urine   He started back walking yesterday and today he is a little sore all over  Had not exercised for a while   Wt is up 6 lb with bmi 27  Was not walking Also not watching diet   See scanned forms.  Routine anticipatory guidance given to patient.  See health maintenance. Colon cancer screening 3/09 (he had a half brother with colon ca)- pt states last visit there was a 43 y recall  Flu vaccine 12/14  Tetanus vaccine 8/09 Pneumovax 1/14  Zoster vaccine - he is interested in the vaccine - he called his insurance  Prostate cancer screening  Lab Results  Component Value Date   PSA 1.41 08/12/2013   PSA 1.16 12/31/2009   PSA 1.16 01/01/2009   he had a prostate/DRE with Dr Fuller Plan not long ago  Nocturia only if he drinks excessive amounts of water or soda No problem with bladder emptying or stream   Advance directive-has not worked on - given packet today  Cognitive function addressed- see scanned forms- and if abnormal then additional  documentation follows. - no problems with memory   PMH and SH reviewed  Meds, vitals, and allergies reviewed.   ROS: See HPI.  Otherwise negative.    bp is stable today  No cp or palpitations or headaches or edema  No side effects to medicines  BP Readings from Last 3 Encounters:  08/19/13 140/84  06/25/13 132/80  04/08/13 135/80     Hx of CAD-unsure when due for f/u with Dr Limmie Patricia will clal   Sugar 104 fasting   Hyperlipidemia  Lab Results  Component Value Date   CHOL 138 08/12/2013   CHOL 141 04/08/2013   CHOL 166 04/30/2012   Lab Results  Component Value Date   HDL 49.40 08/12/2013   HDL 44.20 04/08/2013   HDL 61.30 04/30/2012   Lab Results  Component Value Date   LDLCALC 67 08/12/2013   LDLCALC 63 04/08/2013   LDLCALC 75 04/30/2012   Lab Results  Component Value Date   TRIG 108.0 08/12/2013   TRIG 167.0* 04/08/2013   TRIG 151.0* 04/30/2012   Lab Results  Component Value Date   CHOLHDL 3 08/12/2013   CHOLHDL 3 04/08/2013   CHOLHDL 3 04/30/2012   No results found for this basename: LDLDIRECT    Patient Active Problem List   Diagnosis Date Noted  . Prostate cancer screening 08/11/2013  .  Encounter for Medicare annual wellness exam 08/11/2013  . TRANSAMINASES, SERUM, ELEVATED 04/05/2010  . HYPERLIPIDEMIA 01/09/2007  . ERECTILE DYSFUNCTION 01/09/2007  . HYPERTENSION 01/09/2007  . CORONARY ARTERY DISEASE 01/09/2007  . ESOPHAGITIS 01/09/2007  . BACK PAIN, LUMBAR, WITH RADICULOPATHY 01/09/2007   Past Medical History  Diagnosis Date  . Transfusion history   . CAD (coronary artery disease)     cardiolite ok 3/05, stress test- low risk study 11/06  . Hyperlipidemia   . Hypertension   . ED (erectile dysfunction)   . Chronic low back pain     with radiculopathy  . Gallstones     abd [place]- stable hamangioma, gallbladder sludge and tiny stones 09/2003  . GERD (gastroesophageal reflux disease)    Past Surgical History  Procedure Laterality Date  . Gastrectomy       partial  . Coronary artery bypass graft  1998    x 1  [facility]  . Pars plana vitrectomy  03/13/2012    Procedure: PARS PLANA VITRECTOMY WITH 23 GAUGE;  Surgeon: Adonis Brook, MD;  Location: [place];  Service: Ophthalmology;  Laterality: Left;  Marland Kitchen Membrane peel  03/13/2012    Procedure: MEMBRANE PEEL;  Surgeon: Adonis Brook, MD;  Location: [place];  Service: Ophthalmology;  Laterality: Left;  . Cataract extraction w/phaco  03/13/2012    Procedure: CATARACT EXTRACTION PHACO AND INTRAOCULAR LENS PLACEMENT (IOC);  Surgeon: Adonis Brook, MD;  Location: [place];  Service: Ophthalmology;  Laterality: Left;   History  Substance Use Topics  . Smoking status: Never Smoker   . Smokeless tobacco: Never Used  . Alcohol Use: No   Family History  Problem Relation Age of Onset  . Hypertension Mother   . Cancer Sister     unknown type, pt feels alcohol related  . Cancer Brother     colon  . Cancer Brother     throat, pt feels alcohol related   Allergies  Allergen Reactions  . Atorvastatin     REACTION: elevated LFT's   Current Outpatient Prescriptions on File Prior to Visit  Medication Sig Dispense Refill  . aspirin 325 MG EC tablet Take 325 mg by mouth daily.      Marland Kitchen atenolol (TENORMIN) 50 MG tablet Take 1 tablet (50 mg total) by mouth daily.  90 tablet  3  . atorvastatin (LIPITOR) 10 MG tablet Take 1 tablet (10 mg total) by mouth daily.  90 tablet  3  . Bromfenac Sodium 0.09 % SOLN Use as directed      . dicyclomine (BENTYL) 10 MG capsule Take 1 capsule (10 mg total) by mouth 3 (three) times daily as needed for spasms.  90 capsule  5  . dorzolamide-timolol (COSOPT) 22.3-6.8 MG/ML ophthalmic solution Use as directed      . omeprazole (PRILOSEC) 20 MG capsule Take 1 capsule (20 mg total) by mouth daily as needed. For reflux  90 capsule  3  . tadalafil (CIALIS) 20 MG tablet Take one by mouth up to every 3 days as needed before sexual activity.       . valsartan-hydrochlorothiazide (DIOVAN  HCT) 80-12.5 MG per tablet Take 1 tablet by mouth daily.  90 tablet  3   No current facility-administered medications on file prior to visit.    Review of Systems Review of Systems  Constitutional: Negative for fever, appetite change, fatigue and unexpected weight change.  Eyes: Negative for pain and visual disturbance.  Respiratory: Negative for cough and shortness of breath.  Cardiovascular: Negative for cp or palpitations    Gastrointestinal: Negative for nausea, diarrhea and constipation.  Genitourinary: Negative for urgency and frequency.  Skin: Negative for pallor or rash   Neurological: Negative for weakness, light-headedness, numbness and headaches.  Hematological: Negative for adenopathy. Does not bruise/bleed easily.  Psychiatric/Behavioral: Negative for dysphoric mood. The patient is not nervous/anxious.         Objective:   Physical Exam  Constitutional: He appears well-developed and well-nourished. No distress.  HENT:  Head: Normocephalic and atraumatic.  Right Ear: External ear normal.  Left Ear: External ear normal.  Nose: Nose normal.  Mouth/Throat: Oropharynx is clear and moist.  Eyes: Conjunctivae and EOM are normal. Pupils are equal, round, and reactive to light. Right eye exhibits no discharge. Left eye exhibits no discharge. No scleral icterus.  Neck: Normal range of motion. Neck supple. No JVD present. Carotid bruit is not present. No thyromegaly present.  Cardiovascular: Normal rate, regular rhythm, normal heart sounds and intact distal pulses.  Exam reveals no gallop.   Pulmonary/Chest: Effort normal and breath sounds normal. No respiratory distress. He has no wheezes. He exhibits no tenderness.  Abdominal: Soft. Bowel sounds are normal. He exhibits no distension, no abdominal bruit and no mass. There is no tenderness.  No tenderness in abd or flank No cva tenderness   Musculoskeletal: He exhibits no edema and no tenderness.  No LS or TS tenderness No  kyphosis No cva tenderness  Lymphadenopathy:    He has no cervical adenopathy.  Neurological: He is alert. He has normal reflexes. No cranial nerve deficit. He exhibits normal muscle tone. Coordination normal.  Skin: Skin is warm and dry. No rash noted. No erythema. No pallor.  Psychiatric: He has a normal mood and affect.          Assessment & Plan:

## 2013-08-19 NOTE — Patient Instructions (Addendum)
Take care of yourself  Get back to healthy eating  Get back to regular walking also  Labs are stable  Avoid red meat/ fried foods/ egg yolks/ fatty breakfast meats/ butter, cheese and high fat dairy/ and shellfish  , and also sweets  Shingles vaccine (zostavax) today  Work on a living will/ advanced directive-this is appointment  Call Dr Kennon Holter office to see if you are due for a follow up appointment - stop up front and ask for the phone number if you need it

## 2013-08-19 NOTE — Progress Notes (Signed)
Pre visit review using our clinic review tool, if applicable. No additional management support is needed unless otherwise documented below in the visit note. 

## 2013-08-20 ENCOUNTER — Telehealth: Payer: Self-pay | Admitting: Family Medicine

## 2013-08-20 NOTE — Telephone Encounter (Signed)
Relevant patient education assigned to patient using Emmi. ° °

## 2013-08-20 NOTE — Assessment & Plan Note (Signed)
Lab Results  Component Value Date   PSA 1.41 08/12/2013   PSA 1.16 12/31/2009   PSA 1.16 01/01/2009    No symptoms - overall stable

## 2013-08-20 NOTE — Assessment & Plan Note (Signed)
Reviewed health habits including diet and exercise and skin cancer prevention Reviewed appropriate screening tests for age  Also reviewed health mt list, fam hx and immunization status , as well as social and family history   See HPI Labs rev   

## 2013-08-20 NOTE — Assessment & Plan Note (Signed)
bp in fair control at this time  BP Readings from Last 1 Encounters:  08/19/13 140/84   No changes needed Disc lifstyle change with low sodium diet and exercise   Lab reviewed  He will get back to walking

## 2013-08-20 NOTE — Assessment & Plan Note (Signed)
Disc goals for lipids and reasons to control them Rev labs with pt Rev low sat fat diet in detail  lipitor and diet  Hx of CAD

## 2014-04-14 ENCOUNTER — Other Ambulatory Visit: Payer: Self-pay | Admitting: Family Medicine

## 2014-04-29 ENCOUNTER — Ambulatory Visit (INDEPENDENT_AMBULATORY_CARE_PROVIDER_SITE_OTHER): Payer: Medicare Other | Admitting: Family Medicine

## 2014-04-29 ENCOUNTER — Encounter: Payer: Self-pay | Admitting: Family Medicine

## 2014-04-29 VITALS — BP 146/78 | HR 50 | Temp 98.3°F | Ht 68.0 in | Wt 183.5 lb

## 2014-04-29 DIAGNOSIS — J069 Acute upper respiratory infection, unspecified: Secondary | ICD-10-CM | POA: Diagnosis not present

## 2014-04-29 DIAGNOSIS — R350 Frequency of micturition: Secondary | ICD-10-CM | POA: Diagnosis not present

## 2014-04-29 DIAGNOSIS — N400 Enlarged prostate without lower urinary tract symptoms: Secondary | ICD-10-CM | POA: Diagnosis not present

## 2014-04-29 DIAGNOSIS — B9789 Other viral agents as the cause of diseases classified elsewhere: Secondary | ICD-10-CM

## 2014-04-29 LAB — POCT UA - MICROSCOPIC ONLY
Bacteria, U Microscopic: 0
Casts, Ur, LPF, POC: 0
Crystals, Ur, HPF, POC: 0
RBC, urine, microscopic: 0
WBC, Ur, HPF, POC: 0
Yeast, UA: 0

## 2014-04-29 LAB — POCT URINALYSIS DIPSTICK
Bilirubin, UA: NEGATIVE
Glucose, UA: NEGATIVE
Leukocytes, UA: NEGATIVE
Nitrite, UA: NEGATIVE
Spec Grav, UA: 1.02
UROBILINOGEN UA: 0.2
pH, UA: 6.5

## 2014-04-29 MED ORDER — TAMSULOSIN HCL 0.4 MG PO CAPS: 0.4000 mg | ORAL_CAPSULE | Freq: Every day | ORAL | Status: DC

## 2014-04-29 NOTE — Assessment & Plan Note (Signed)
Re assuring exam Disc symptomatic care - see instructions on AVS  Fluids/ rest/ expectorant Update if not starting to improve in a week or if worsening

## 2014-04-29 NOTE — Progress Notes (Signed)
Pre visit review using our clinic review tool, if applicable. No additional management support is needed unless otherwise documented below in the visit note. 

## 2014-04-29 NOTE — Assessment & Plan Note (Signed)
Causing urinary frequency Trial of flomax .4 mg daily-disc side eff incl dizziness in detail  F/u 2-3 wk  Limit fluids before bed  Will also cut back on coffee

## 2014-04-29 NOTE — Progress Notes (Signed)
Subjective:    Patient ID: Jerry Farrell, male    DOB: [birth year], 69 y.o.   MRN: [id number]  HPI Here for urinary frequency and uri symptoms  He gets up 2 times a night to urinate  (more if he drinks fluids before bed)  Some more during the day  Stream may be more slow  No burning to urinate  No pain   Lab Results  Component Value Date   PSA 1.41 08/12/2013   PSA 1.16 12/31/2009   PSA 1.16 01/01/2009     Results for orders placed or performed in visit on 04/29/14  POCT urinalysis dipstick  Result Value Ref Range   Color, UA yellow    Clarity, UA clear    Glucose, UA neg.    Bilirubin, UA neg.    Ketones, UA small    Spec Grav, UA 1.020    Blood, UA Moderate    pH, UA 6.5    Protein, UA 30+    Urobilinogen, UA 0.2    Nitrite, UA neg.    Leukocytes, UA Negative    blood and protein in urine  Does not drink enough water as well  Too much coffee   Has a cold also - about 1-2 weeks  No otc meds  Some cough -? Getting better / dry cough for the most part  Nasal congestion - drainage is clear  Headache -forehead  No fever      [place] flu shot [place] [employer]  ? If it was the high dose one  Was very sore arm and felt poorly  Had a knot there    Patient Active Problem List   Diagnosis Date Noted  . Urinary frequency 04/29/2014  . BPH (benign prostatic hyperplasia) 04/29/2014  . Viral URI with cough 04/29/2014  . Prostate cancer screening 08/11/2013  . Encounter for Medicare annual wellness exam 08/11/2013  . TRANSAMINASES, SERUM, ELEVATED 04/05/2010  . HYPERLIPIDEMIA 01/09/2007  . ERECTILE DYSFUNCTION 01/09/2007  . HYPERTENSION 01/09/2007  . CORONARY ARTERY DISEASE 01/09/2007  . ESOPHAGITIS 01/09/2007  . BACK PAIN, LUMBAR, WITH RADICULOPATHY 01/09/2007   Past Medical History  Diagnosis Date  . Transfusion history   . CAD (coronary artery disease)     cardiolite ok 3/05, stress test- low risk study 11/06  . Hyperlipidemia   . Hypertension   . ED  (erectile dysfunction)   . Chronic low back pain     with radiculopathy  . Gallstones     abd [place]- stable hamangioma, gallbladder sludge and tiny stones 09/2003  . GERD (gastroesophageal reflux disease)    Past Surgical History  Procedure Laterality Date  . Gastrectomy      partial  . Coronary artery bypass graft  1998    x 1  [facility]  . Pars plana vitrectomy  03/13/2012    Procedure: PARS PLANA VITRECTOMY WITH 23 GAUGE;  Surgeon: Adonis Brook, MD;  Location: [place];  Service: Ophthalmology;  Laterality: Left;  Marland Kitchen Membrane peel  03/13/2012    Procedure: MEMBRANE PEEL;  Surgeon: Adonis Brook, MD;  Location: [place];  Service: Ophthalmology;  Laterality: Left;  . Cataract extraction w/phaco  03/13/2012    Procedure: CATARACT EXTRACTION PHACO AND INTRAOCULAR LENS PLACEMENT (IOC);  Surgeon: Adonis Brook, MD;  Location: [place];  Service: Ophthalmology;  Laterality: Left;   History  Substance Use Topics  . Smoking status: Never Smoker   . Smokeless tobacco: Never Used  . Alcohol Use: No  Family History  Problem Relation Age of Onset  . Hypertension Mother   . Cancer Sister     unknown type, pt feels alcohol related  . Cancer Brother     colon  . Cancer Brother     throat, pt feels alcohol related   Allergies  Allergen Reactions  . Atorvastatin     REACTION: elevated LFT's   Current Outpatient Prescriptions on File Prior to Visit  Medication Sig Dispense Refill  . aspirin 325 MG EC tablet Take 325 mg by mouth daily.    Marland Kitchen atenolol (TENORMIN) 50 MG tablet TAKE 1 TABLET (50 MG TOTAL) BY MOUTH DAILY. 90 tablet 1  . atorvastatin (LIPITOR) 10 MG tablet TAKE 1 TABLET (10 MG TOTAL) BY MOUTH DAILY. 90 tablet 1  . Bromfenac Sodium 0.09 % SOLN Use as directed    . dicyclomine (BENTYL) 10 MG capsule Take 1 capsule (10 mg total) by mouth 3 (three) times daily as needed for spasms. 90 capsule 5  . dorzolamide-timolol (COSOPT) 22.3-6.8 MG/ML ophthalmic solution Use as directed    .  omeprazole (PRILOSEC) 20 MG capsule TAKE 1 CAPSULE (20 MG TOTAL) BY MOUTH DAILY AS NEEDED. FOR REFLUX 90 capsule 1  . tadalafil (CIALIS) 20 MG tablet Take one by mouth up to every 3 days as needed before sexual activity.     . valsartan-hydrochlorothiazide (DIOVAN-HCT) 80-12.5 MG per tablet TAKE 1 TABLET BY MOUTH DAILY. 90 tablet 1   No current facility-administered medications on file prior to visit.    Review of Systems    Review of Systems  Constitutional: Negative for fever, appetite change, and unexpected weight change.  ENT pos for cong and rhinorrhea and drip /neg for sinus pain  Eyes: Negative for pain and visual disturbance.  Respiratory: Negative for wheeze and shortness of breath.   Cardiovascular: Negative for cp or palpitations    Gastrointestinal: Negative for nausea, diarrhea and constipation.  Genitourinary: Negative for urgency and pos for frequency. pos for nocturia/ neg for dysuria  Skin: Negative for pallor or rash   Neurological: Negative for weakness, light-headedness, numbness and headaches.  Hematological: Negative for adenopathy. Does not bruise/bleed easily.  Psychiatric/Behavioral: Negative for dysphoric mood. The patient is not nervous/anxious.      Objective:   Physical Exam  Constitutional: He appears well-developed and well-nourished. No distress.  HENT:  Head: Normocephalic and atraumatic.  Right Ear: External ear normal.  Left Ear: External ear normal.  Mouth/Throat: Oropharynx is clear and moist. No oropharyngeal exudate.  Nares are injected and congested  No sinus tenderness   Eyes: Conjunctivae and EOM are normal. Pupils are equal, round, and reactive to light. Right eye exhibits no discharge. Left eye exhibits no discharge. No scleral icterus.  Neck: Normal range of motion.  Cardiovascular: Regular rhythm and normal heart sounds.   No murmur heard. Pulmonary/Chest: Effort normal and breath sounds normal. No respiratory distress. He has no  wheezes. He has no rales.  Abdominal: Soft. Bowel sounds are normal. He exhibits no distension and no mass. There is no tenderness. There is no rebound and no guarding.  No cva tenderness   Genitourinary: Rectum normal. Prostate is enlarged.  Mildly enl prostate Symmetric/ firm and nontender   Lymphadenopathy:    He has no cervical adenopathy.  Neurological: He is alert. He has normal reflexes.  Skin: Skin is warm and dry. No rash noted. No erythema.  Psychiatric: He has a normal mood and affect.  Assessment & Plan:   Problem List Items Addressed This Visit      Respiratory   Viral URI with cough    Re assuring exam Disc symptomatic care - see instructions on AVS  Fluids/ rest/ expectorant Update if not starting to improve in a week or if worsening        Genitourinary   BPH (benign prostatic hyperplasia)    Causing urinary frequency Trial of flomax .4 mg daily-disc side eff incl dizziness in detail  F/u 2-3 wk  Limit fluids before bed  Will also cut back on coffee     Relevant Medications      tamsulosin (FLOMAX) capsule 0.4 mg     Other   Urinary frequency - Primary    ua had blood on dip but clear on micro  Will continue to watch  Suspect BPH  Will tx with flomax and f/u    Relevant Orders      POCT urinalysis dipstick (Completed)      POCT UA - Microscopic Only (Completed)

## 2014-04-29 NOTE — Assessment & Plan Note (Signed)
ua had blood on dip but clear on micro  Will continue to watch  Suspect BPH  Will tx with flomax and f/u

## 2014-04-29 NOTE — Patient Instructions (Addendum)
I think you have a cold that will take another week or so to get better  Drink lots of fluids mucinex or robitussin is ok for cough (not the DM or the D - just plain) Update if not starting to improve in a week or if worsening    Your prostate is mildly enlarged - causing urinary frequency -especially at night  Try flomax daily   Follow up with me in 2-3 weeks

## 2014-05-13 ENCOUNTER — Encounter: Payer: Self-pay | Admitting: Family Medicine

## 2014-05-13 ENCOUNTER — Ambulatory Visit (INDEPENDENT_AMBULATORY_CARE_PROVIDER_SITE_OTHER): Payer: Medicare Other | Admitting: Family Medicine

## 2014-05-13 VITALS — BP 142/62 | HR 60 | Temp 98.6°F | Ht 68.0 in | Wt 188.1 lb

## 2014-05-13 DIAGNOSIS — N400 Enlarged prostate without lower urinary tract symptoms: Secondary | ICD-10-CM

## 2014-05-13 DIAGNOSIS — J069 Acute upper respiratory infection, unspecified: Secondary | ICD-10-CM

## 2014-05-13 DIAGNOSIS — B9789 Other viral agents as the cause of diseases classified elsewhere: Secondary | ICD-10-CM

## 2014-05-13 MED ORDER — TAMSULOSIN HCL 0.4 MG PO CAPS: 0.4000 mg | ORAL_CAPSULE | Freq: Every day | ORAL | Status: DC

## 2014-05-13 NOTE — Patient Instructions (Addendum)
Continue the generic flomax (prostate medicine)  I'm glad it is better  Take care of yourself  mucinex over the counter is good for congestion from a cold   Update me if cold symptoms worsen   Schedule annual exam for the summer

## 2014-05-13 NOTE — Progress Notes (Signed)
Subjective:    Patient ID: Jerry Farrell, male    DOB: 10-Jul-1945, 69 y.o.   MRN: [id number]  HPI Here for f/u of BPH   Wt is up about 5 lb - upset about that   Started flomax and it is helping  Only gets up once per night to urinate now  Less urination frequency during the day now  Drinks more of his fluids during the day  His stream is stronger now  No side effects   Cold is improved occ cough  Is blowing out congestion also   Patient Active Problem List   Diagnosis Date Noted  . Urinary frequency 04/29/2014  . BPH (benign prostatic hyperplasia) 04/29/2014  . Viral URI with cough 04/29/2014  . Prostate cancer screening 08/11/2013  . Encounter for Medicare annual wellness exam 08/11/2013  . TRANSAMINASES, SERUM, ELEVATED 04/05/2010  . HYPERLIPIDEMIA 01/09/2007  . ERECTILE DYSFUNCTION 01/09/2007  . HYPERTENSION 01/09/2007  . CORONARY ARTERY DISEASE 01/09/2007  . ESOPHAGITIS 01/09/2007  . BACK PAIN, LUMBAR, WITH RADICULOPATHY 01/09/2007   Past Medical History  Diagnosis Date  . Transfusion history   . CAD (coronary artery disease)     cardiolite ok 3/05, stress test- low risk study 11/06  . Hyperlipidemia   . Hypertension   . ED (erectile dysfunction)   . Chronic low back pain     with radiculopathy  . Gallstones     abd [place]- stable hamangioma, gallbladder sludge and tiny stones 09/2003  . GERD (gastroesophageal reflux disease)    Past Surgical History  Procedure Laterality Date  . Gastrectomy      partial  . Coronary artery bypass graft  1998    x 1  done here [facility]  . Pars plana vitrectomy  03/13/2012    Procedure: PARS PLANA VITRECTOMY WITH 23 GAUGE;  Surgeon: Adonis Brook, MD;  Location: [place];  Service: Ophthalmology;  Laterality: Left;  Marland Kitchen Membrane peel  03/13/2012    Procedure: MEMBRANE PEEL;  Surgeon: Adonis Brook, MD;  Location: [place];  Service: Ophthalmology;  Laterality: Left;  . Cataract extraction w/phaco  03/13/2012    Procedure: CATARACT  EXTRACTION PHACO AND INTRAOCULAR LENS PLACEMENT (IOC);  Surgeon: Adonis Brook, MD;  Location: [place];  Service: Ophthalmology;  Laterality: Left;   History  Substance Use Topics  . Smoking status: Never Smoker   . Smokeless tobacco: Never Used  . Alcohol Use: No   Family History  Problem Relation Age of Onset  . Hypertension Mother   . Cancer Sister     unknown type, pt feels alcohol related  . Cancer Brother     colon  . Cancer Brother     throat, pt feels alcohol related   Allergies  Allergen Reactions  . Atorvastatin     REACTION: elevated LFT's   Current Outpatient Prescriptions on File Prior to Visit  Medication Sig Dispense Refill  . aspirin 325 MG EC tablet Take 325 mg by mouth daily.    Marland Kitchen atenolol (TENORMIN) 50 MG tablet TAKE 1 TABLET (50 MG TOTAL) BY MOUTH DAILY. 90 tablet 1  . atorvastatin (LIPITOR) 10 MG tablet TAKE 1 TABLET (10 MG TOTAL) BY MOUTH DAILY. 90 tablet 1  . Bromfenac Sodium 0.09 % SOLN Use as directed    . dicyclomine (BENTYL) 10 MG capsule Take 1 capsule (10 mg total) by mouth 3 (three) times daily as needed for spasms. 90 capsule 5  . dorzolamide-timolol (COSOPT) 22.3-6.8 MG/ML ophthalmic solution Use  as directed    . omeprazole (PRILOSEC) 20 MG capsule TAKE 1 CAPSULE (20 MG TOTAL) BY MOUTH DAILY AS NEEDED. FOR REFLUX 90 capsule 1  . tadalafil (CIALIS) 20 MG tablet Take one by mouth up to every 3 days as needed before sexual activity.     . tamsulosin (FLOMAX) 0.4 MG CAPS capsule Take 1 capsule (0.4 mg total) by mouth daily. 30 capsule 3  . valsartan-hydrochlorothiazide (DIOVAN-HCT) 80-12.5 MG per tablet TAKE 1 TABLET BY MOUTH DAILY. 90 tablet 1   No current facility-administered medications on file prior to visit.     Review of Systems Review of Systems  Constitutional: Negative for fever, appetite change, fatigue and unexpected weight change.  Eyes: Negative for pain and visual disturbance.  ENT pos for congestion /neg for sinus pain  Respiratory:  Negative for cough and shortness of breath.   Cardiovascular: Negative for cp or palpitations    Gastrointestinal: Negative for nausea, diarrhea and constipation.  Genitourinary: Negative for urgency and frequency. neg for hematuria or dysuria Skin: Negative for pallor or rash   Neurological: Negative for weakness, light-headedness, numbness and headaches.  Hematological: Negative for adenopathy. Does not bruise/bleed easily.  Psychiatric/Behavioral: Negative for dysphoric mood. The patient is not nervous/anxious.         Objective:   Physical Exam  Constitutional: He appears well-developed and well-nourished. No distress.  HENT:  Head: Normocephalic and atraumatic.  Right Ear: External ear normal.  Left Ear: External ear normal.  Mouth/Throat: Oropharynx is clear and moist.  Nares are boggy No sinus tenderness Throat clear   Eyes: Conjunctivae and EOM are normal. Pupils are equal, round, and reactive to light. Right eye exhibits no discharge. Left eye exhibits no discharge. No scleral icterus.  Neck: Normal range of motion. Neck supple.  Cardiovascular: Normal rate and regular rhythm.   Pulmonary/Chest: Effort normal and breath sounds normal.  Lymphadenopathy:    He has no cervical adenopathy.  Neurological: He is alert.  Skin: Skin is warm and dry. No rash noted.  Psychiatric: He has a normal mood and affect.          Assessment & Plan:   Problem List Items Addressed This Visit      Respiratory   Viral URI with cough    Improved -still some congestion  Recommended mucinex otc Disc symptomatic care - see instructions on AVS  Update if not starting to improve in a week or if worsening          Genitourinary   BPH (benign prostatic hyperplasia) - Primary    Symptoms much improved with flomax and tolerated well Will continue to follow  psa at next annual exam      Relevant Medications   tamsulosin (FLOMAX) 0.4 MG CAPS capsule

## 2014-05-13 NOTE — Progress Notes (Signed)
Pre visit review using our clinic review tool, if applicable. No additional management support is needed unless otherwise documented below in the visit note. 

## 2014-05-15 NOTE — Assessment & Plan Note (Signed)
Improved -still some congestion  Recommended mucinex otc Disc symptomatic care - see instructions on AVS  Update if not starting to improve in a week or if worsening

## 2014-05-15 NOTE — Assessment & Plan Note (Signed)
Symptoms much improved with flomax and tolerated well Will continue to follow  psa at next annual exam

## 2014-06-12 ENCOUNTER — Telehealth: Payer: Self-pay | Admitting: Family Medicine

## 2014-06-12 ENCOUNTER — Encounter: Payer: Self-pay | Admitting: Family Medicine

## 2014-06-12 ENCOUNTER — Ambulatory Visit: Payer: Self-pay | Admitting: Family Medicine

## 2014-06-12 ENCOUNTER — Ambulatory Visit (INDEPENDENT_AMBULATORY_CARE_PROVIDER_SITE_OTHER): Payer: Medicare Other | Admitting: Family Medicine

## 2014-06-12 VITALS — BP 170/86 | HR 53 | Temp 98.8°F | Wt 185.2 lb

## 2014-06-12 DIAGNOSIS — I10 Essential (primary) hypertension: Secondary | ICD-10-CM

## 2014-06-12 LAB — BASIC METABOLIC PANEL
BUN: 13 mg/dL (ref 6–23)
CALCIUM: 9.3 mg/dL (ref 8.4–10.5)
CO2: 29 mEq/L (ref 19–32)
Chloride: 97 mEq/L (ref 96–112)
Creat: 0.98 mg/dL (ref 0.50–1.35)
GLUCOSE: 86 mg/dL (ref 70–99)
POTASSIUM: 4 meq/L (ref 3.5–5.3)
SODIUM: 134 meq/L — AB (ref 135–145)

## 2014-06-12 NOTE — Telephone Encounter (Signed)
Pt has 2 appts scheduled 06/12/14; at 2pm scheduled with Dr Damita Dunnings and at 2:45 pm scheduled with Dr Diona Browner.

## 2014-06-12 NOTE — Telephone Encounter (Signed)
Spoke with patient and cancelled 2:45 appt with Dr. Diona Browner.  He will keep the 2:00 appt with Dr. Damita Dunnings.

## 2014-06-12 NOTE — Telephone Encounter (Signed)
[facility] Patient Name: Jerry Farrell DOB: [birth year] Initial Comment Caller states his BP is high. 175/91 Nurse Assessment Nurse: Markus Daft, RN, Windy Date/Time (Eastern Time): 06/12/2014 9:53:39 AM Confirm and document reason for call. If symptomatic, describe symptoms. ---Caller states his BP is 175/91, HR 50's bpm this morning. He has mild cold s/s only. Has the patient traveled out of the country within the last 30 days? ---Not Applicable Does the patient require triage? ---Yes Related visit to physician within the last 2 weeks? ---No Does the PT have any chronic conditions? (i.e. diabetes, asthma, etc.) ---Yes List chronic conditions. ---HTN Guidelines Guideline Title Affirmed Question Affirmed Notes High Blood Pressure BP > 160/100 Final Disposition User See PCP When Office is Open (within 3 days) Markus Daft, Therapist, sports, Sherre Poot Comments Appt made today at 2:45 pm with Dr. Eliezer Lofts.

## 2014-06-12 NOTE — Progress Notes (Signed)
Pre visit review using our clinic review tool, if applicable. No additional management support is needed unless otherwise documented below in the visit note.  Compliant with meds.  BP had been controlled until recently.  BP was okay out of clinic last week.   Recently with URI.  Wasn't taking decongestants.   He had more salt recently, ate some pork.   No CP, SOB, BLE.  Fells okay o/w.  No vision changes.   No motor changes.    Meds, vitals, and allergies reviewed.   ROS: See HPI.  Otherwise, noncontributory.  GEN: nad, alert and oriented HEENT: mucous membranes moist NECK: supple w/o LA CV: rrr PULM: ctab, no inc wob ABD: soft, +bs EXT: no edema

## 2014-06-12 NOTE — Patient Instructions (Signed)
Go to the lab on the way out.  We'll contact you with your lab report. I think your blood pressure increase was from the extra salt.  Cut out the salt (especially the pork and fried foods) and drink a lot of water.  Update [place] with your BP reading next week.  Take care.  Glad to see you.

## 2014-06-14 NOTE — Assessment & Plan Note (Signed)
I think his blood pressure elevation was from the extra salt.  He'll cut out the salt (especially the pork and fried foods) and drink a lot of water; will update [place] with your BP reading next week.  Check BMET in the meantime.  He agrees.

## 2014-08-14 DIAGNOSIS — Z961 Presence of intraocular lens: Secondary | ICD-10-CM | POA: Diagnosis not present

## 2014-08-14 DIAGNOSIS — H2511 Age-related nuclear cataract, right eye: Secondary | ICD-10-CM | POA: Diagnosis not present

## 2014-08-14 DIAGNOSIS — H25041 Posterior subcapsular polar age-related cataract, right eye: Secondary | ICD-10-CM | POA: Diagnosis not present

## 2014-08-14 DIAGNOSIS — H25011 Cortical age-related cataract, right eye: Secondary | ICD-10-CM | POA: Diagnosis not present

## 2014-08-27 ENCOUNTER — Other Ambulatory Visit: Payer: Self-pay | Admitting: *Deleted

## 2014-08-27 MED ORDER — TAMSULOSIN HCL 0.4 MG PO CAPS: 0.4000 mg | ORAL_CAPSULE | Freq: Every day | ORAL | Status: DC

## 2014-08-27 NOTE — Telephone Encounter (Signed)
Received fax requesting Rx be changed to a 90 day supply. Done

## 2014-10-11 ENCOUNTER — Other Ambulatory Visit: Payer: Self-pay | Admitting: Family Medicine

## 2014-10-22 ENCOUNTER — Telehealth: Payer: Self-pay | Admitting: Family Medicine

## 2014-10-22 DIAGNOSIS — I10 Essential (primary) hypertension: Secondary | ICD-10-CM

## 2014-10-22 DIAGNOSIS — N400 Enlarged prostate without lower urinary tract symptoms: Secondary | ICD-10-CM

## 2014-10-22 DIAGNOSIS — E785 Hyperlipidemia, unspecified: Secondary | ICD-10-CM

## 2014-10-22 NOTE — Telephone Encounter (Signed)
-----   Message from Ellamae Sia sent at 10/16/2014 11:05 AM EDT ----- Regarding: Lab orders for Friday,7.1.16 Patient is scheduled for CPX labs, please order future labs, Thanks , Karna Christmas

## 2014-10-23 ENCOUNTER — Encounter (INDEPENDENT_AMBULATORY_CARE_PROVIDER_SITE_OTHER): Payer: Self-pay

## 2014-10-23 ENCOUNTER — Other Ambulatory Visit (INDEPENDENT_AMBULATORY_CARE_PROVIDER_SITE_OTHER): Payer: Medicare Other

## 2014-10-23 DIAGNOSIS — E785 Hyperlipidemia, unspecified: Secondary | ICD-10-CM | POA: Diagnosis not present

## 2014-10-23 DIAGNOSIS — N400 Enlarged prostate without lower urinary tract symptoms: Secondary | ICD-10-CM

## 2014-10-23 DIAGNOSIS — I10 Essential (primary) hypertension: Secondary | ICD-10-CM | POA: Diagnosis not present

## 2014-10-23 LAB — COMPREHENSIVE METABOLIC PANEL
ALT: 43 U/L (ref 0–53)
AST: 24 U/L (ref 0–37)
Albumin: 4 g/dL (ref 3.5–5.2)
Alkaline Phosphatase: 145 U/L — ABNORMAL HIGH (ref 39–117)
BUN: 15 mg/dL (ref 6–23)
CO2: 30 meq/L (ref 19–32)
CREATININE: 1.02 mg/dL (ref 0.40–1.50)
Calcium: 9 mg/dL (ref 8.4–10.5)
Chloride: 103 mEq/L (ref 96–112)
GFR: 92.51 mL/min (ref >60.00)
Glucose, Bld: 94 mg/dL (ref 70–99)
POTASSIUM: 4.3 meq/L (ref 3.5–5.1)
Sodium: 139 mEq/L (ref 135–145)
TOTAL PROTEIN: 6.7 g/dL (ref 6.0–8.3)
Total Bilirubin: 1.3 mg/dL — ABNORMAL HIGH (ref 0.2–1.2)

## 2014-10-23 LAB — CBC WITH DIFFERENTIAL/PLATELET
BASOS ABS: 0 10*3/uL (ref 0.0–0.1)
Basophils Relative: 0.6 % (ref 0.0–3.0)
Eosinophils Absolute: 0.1 10*3/uL (ref 0.0–0.7)
Eosinophils Relative: 0.9 % (ref 0.0–5.0)
HEMATOCRIT: 44.7 % (ref 39.0–52.0)
Hemoglobin: 15.2 g/dL (ref 13.0–17.0)
LYMPHS PCT: 18.2 % (ref 12.0–46.0)
Lymphs Abs: 1.3 10*3/uL (ref 0.7–4.0)
MCHC: 34 g/dL (ref 30.0–36.0)
MCV: 86.3 fl (ref 78.0–100.0)
MONO ABS: 0.4 10*3/uL (ref 0.1–1.0)
Monocytes Relative: 5.9 % (ref 3.0–12.0)
Neutro Abs: 5.2 10*3/uL (ref 1.4–7.7)
Neutrophils Relative %: 74.4 % (ref 43.0–77.0)
Platelets: 140 10*3/uL — ABNORMAL LOW (ref 150.0–400.0)
RBC: 5.17 Mil/uL (ref 4.22–5.81)
RDW: 13.5 % (ref 11.5–15.5)
WBC: 7 10*3/uL (ref 4.0–10.5)

## 2014-10-23 LAB — LIPID PANEL
CHOL/HDL RATIO: 3
Cholesterol: 122 mg/dL (ref 0–200)
HDL: 41.6 mg/dL (ref >39.00)
LDL CALC: 62 mg/dL (ref 0–99)
NonHDL: 80.4
TRIGLYCERIDES: 93 mg/dL (ref 0.0–149.0)
VLDL: 18.6 mg/dL (ref 0.0–40.0)

## 2014-10-23 LAB — PSA: PSA: 1.21 ng/mL (ref 0.10–4.00)

## 2014-10-23 LAB — TSH: TSH: 0.41 u[IU]/mL (ref 0.35–4.50)

## 2014-10-28 ENCOUNTER — Ambulatory Visit (INDEPENDENT_AMBULATORY_CARE_PROVIDER_SITE_OTHER): Payer: Medicare Other | Admitting: Family Medicine

## 2014-10-28 ENCOUNTER — Encounter: Payer: Self-pay | Admitting: Family Medicine

## 2014-10-28 VITALS — BP 130/60 | HR 61 | Temp 98.4°F | Ht 69.0 in | Wt 182.1 lb

## 2014-10-28 DIAGNOSIS — I10 Essential (primary) hypertension: Secondary | ICD-10-CM

## 2014-10-28 DIAGNOSIS — E785 Hyperlipidemia, unspecified: Secondary | ICD-10-CM

## 2014-10-28 DIAGNOSIS — Z0000 Encounter for general adult medical examination without abnormal findings: Secondary | ICD-10-CM

## 2014-10-28 DIAGNOSIS — R748 Abnormal levels of other serum enzymes: Secondary | ICD-10-CM | POA: Insufficient documentation

## 2014-10-28 DIAGNOSIS — Z23 Encounter for immunization: Secondary | ICD-10-CM

## 2014-10-28 DIAGNOSIS — N400 Enlarged prostate without lower urinary tract symptoms: Secondary | ICD-10-CM

## 2014-10-28 NOTE — Progress Notes (Signed)
Pre visit review using our clinic review tool, if applicable. No additional management support is needed unless otherwise documented below in the visit note. 

## 2014-10-28 NOTE — Patient Instructions (Signed)
Increase your water intake - for general health and kidney health  prevnar vaccine today (pneumonia vaccine booster) Please work on an advance directive (look at the blue packet I gave you) In your labs - your Alkaline Phosphate level is a little high - unsure if any significance, but I'd like to re check it in 1-2 months - please make a lab appointment  Keep taking good care of yourself  Do not work outdoors in extreme heat- use caution

## 2014-10-28 NOTE — Assessment & Plan Note (Signed)
Reviewed health habits including diet and exercise and skin cancer prevention Reviewed appropriate screening tests for age  Also reviewed health mt list, fam hx and immunization status , as well as social and family history   See HPI Labs reviewed Increase your water intake - for general health and kidney health  prevnar vaccine today (pneumonia vaccine booster) Please work on an advance directive (look at the blue packet I gave you)

## 2014-10-28 NOTE — Assessment & Plan Note (Signed)
Disc goals for lipids and reasons to control them Rev labs with pt Rev low sat fat diet in detail Well controlled with atorvastatin and diet  Great exercise habits also

## 2014-10-28 NOTE — Progress Notes (Signed)
Subjective:    Patient ID: Jerry Farrell, male    DOB: 1945/12/20, 69 y.o.   MRN: [id number]  HPI Here for annual medicare wellness visit as well as chronic/acute medical problems as well as annual preventative exam  I have personally reviewed the Medicare Annual Wellness questionnaire and have noted 1. The patient's medical and social history 2. Their use of alcohol, tobacco or illicit drugs 3. Their current medications and supplements 4. The patient's functional ability including ADL's, fall risks, home safety risks and hearing or visual             impairment. 5. Diet and physical activities 6. Evidence for depression or mood disorders  The patients weight, height, BMI have been recorded in the chart and visual acuity is per eye clinic.  I have made referrals, counseling and provided education to the patient based review of the above and I have provided the pt with a written personalized care plan for preventive services. Reviewed and updated provider list, see scanned forms.  Staying busy  A little tired - was helping his son build a structure  No falls / but not as steady as he used to be (got dizzy outdoors once in the sun- gets hot quicker)   Wt is down 3 lb with bmi of 26 (overall stable)  Back to walking 5 days per week - early in am - stays physically fit  Knows he needs to drink more water   See scanned forms.  Routine anticipatory guidance given to patient.  See health maintenance. Colon cancer screening had colonosc 3/09 , half brother  dx with colon cancer - recall was 10 year  BPH/prostate screen  Lab Results  Component Value Date   PSA 1.21 10/23/2014   PSA 1.41 08/12/2013   PSA 1.16 12/31/2009  stable  On flomax - helps tremendously with symptoms - he is pleased with it , many nights no nocturia at all  Urine stream seems normal now  Flu vaccine 11/15 Tetanus vaccine 8/09 Pneumovax 1/14, needs the prevnar (will get that today)  Zoster vaccine 4/15 Hep C  screen - does not desire screening /not high risk  Advance directive - given info about it / blue packet /unsure if he has one  Cognitive function addressed- see scanned forms- and if abnormal then additional documentation follows. -no concerns about memory   PMH and SH reviewed  Meds, vitals, and allergies reviewed.   ROS: See HPI.  Otherwise negative.    Hx of mildly low platelets Lab Results  Component Value Date   WBC 7.0 10/23/2014   HGB 15.2 10/23/2014   HCT 44.7 10/23/2014   MCV 86.3 10/23/2014   PLT 140.0* 10/23/2014    Hyperlipidemia  On lipitor and diet  Lab Results  Component Value Date   CHOL 122 10/23/2014   CHOL 138 08/12/2013   CHOL 141 04/08/2013   Lab Results  Component Value Date   HDL 41.60 10/23/2014   HDL 49.40 08/12/2013   HDL 44.20 04/08/2013   Lab Results  Component Value Date   LDLCALC 62 10/23/2014   LDLCALC 67 08/12/2013   LDLCALC 63 04/08/2013   Lab Results  Component Value Date   TRIG 93.0 10/23/2014   TRIG 108.0 08/12/2013   TRIG 167.0* 04/08/2013   Lab Results  Component Value Date   CHOLHDL 3 10/23/2014   CHOLHDL 3 08/12/2013   CHOLHDL 3 04/08/2013  still eats a very healthy diet - wife cooks well /  eating many fruits and veg  Avoids fried food/ red meat  No results found for: LDLDIRECT   bp is stable today  No cp or palpitations or headaches or edema  No side effects to medicines  BP Readings from Last 3 Encounters:  10/28/14 130/60  06/12/14 170/86  05/13/14 142/62       Chemistry      Component Value Date/Time   NA 139 10/23/2014 0742   K 4.3 10/23/2014 0742   CL 103 10/23/2014 0742   CO2 30 10/23/2014 0742   BUN 15 10/23/2014 0742   CREATININE 1.02 10/23/2014 0742   CREATININE 0.98 06/12/2014 1433      Component Value Date/Time   CALCIUM 9.0 10/23/2014 0742   ALKPHOS 145* 10/23/2014 0742   AST 24 10/23/2014 0742   ALT 43 10/23/2014 0742   BILITOT 1.3* 10/23/2014 0742      Glucose is 94  Alk phos is  slt elevated - will watch  No pain / or GI symptoms   Patient Active Problem List   Diagnosis Date Noted  . Routine general medical examination at a health care facility 10/28/2014  . Urinary frequency 04/29/2014  . BPH (benign prostatic hyperplasia) 04/29/2014  . Viral URI with cough 04/29/2014  . Prostate cancer screening 08/11/2013  . Encounter for Medicare annual wellness exam 08/11/2013  . TRANSAMINASES, SERUM, ELEVATED 04/05/2010  . Hyperlipidemia 01/09/2007  . ERECTILE DYSFUNCTION 01/09/2007  . Essential hypertension 01/09/2007  . CORONARY ARTERY DISEASE 01/09/2007  . ESOPHAGITIS 01/09/2007  . BACK PAIN, LUMBAR, WITH RADICULOPATHY 01/09/2007   Past Medical History  Diagnosis Date  . Transfusion history   . CAD (coronary artery disease)     cardiolite ok 3/05, stress test- low risk study 11/06  . Hyperlipidemia   . Hypertension   . ED (erectile dysfunction)   . Chronic low back pain     with radiculopathy  . Gallstones     abd [place]- stable hamangioma, gallbladder sludge and tiny stones 09/2003  . GERD (gastroesophageal reflux disease)    Past Surgical History  Procedure Laterality Date  . Gastrectomy      partial  . Coronary artery bypass graft  1998    x 1  done [facility]  . Pars plana vitrectomy  03/13/2012    Procedure: PARS PLANA VITRECTOMY WITH 23 GAUGE;  Surgeon: Adonis Brook, MD;  Location: [place];  Service: Ophthalmology;  Laterality: Left;  Marland Kitchen Membrane peel  03/13/2012    Procedure: MEMBRANE PEEL;  Surgeon: Adonis Brook, MD;  Location: [place];  Service: Ophthalmology;  Laterality: Left;  . Cataract extraction w/phaco  03/13/2012    Procedure: CATARACT EXTRACTION PHACO AND INTRAOCULAR LENS PLACEMENT (IOC);  Surgeon: Adonis Brook, MD;  Location: [place];  Service: Ophthalmology;  Laterality: Left;   History  Substance Use Topics  . Smoking status: Never Smoker   . Smokeless tobacco: Never Used  . Alcohol Use: No   Family History  Problem Relation Age of  Onset  . Hypertension Mother   . Cancer Sister     unknown type, pt feels alcohol related  . Cancer Brother     colon  . Cancer Brother     throat, pt feels alcohol related   Allergies  Allergen Reactions  . Atorvastatin     REACTION: elevated LFT's   Current Outpatient Prescriptions on File Prior to Visit  Medication Sig Dispense Refill  . aspirin 325 MG EC tablet Take 325 mg by mouth  daily.    . atenolol (TENORMIN) 50 MG tablet TAKE 1 TABLET (50 MG TOTAL) BY MOUTH DAILY. 90 tablet 0  . atorvastatin (LIPITOR) 10 MG tablet TAKE 1 TABLET BY MOUTH EVERY DAY 90 tablet 0  . Bromfenac Sodium 0.09 % SOLN Use as directed    . dicyclomine (BENTYL) 10 MG capsule Take 1 capsule (10 mg total) by mouth 3 (three) times daily as needed for spasms. 90 capsule 5  . dorzolamide-timolol (COSOPT) 22.3-6.8 MG/ML ophthalmic solution Use as directed    . omeprazole (PRILOSEC) 20 MG capsule TAKE ONE CAPSULE BY MOUTH ONCE A DAY AS NEEDED FOR REFLUX 90 capsule 0  . tadalafil (CIALIS) 20 MG tablet Take one by mouth up to every 3 days as needed before sexual activity.     . tamsulosin (FLOMAX) 0.4 MG CAPS capsule Take 1 capsule (0.4 mg total) by mouth daily. 90 capsule 2  . valsartan-hydrochlorothiazide (DIOVAN-HCT) 80-12.5 MG per tablet TAKE 1 TABLET BY MOUTH EVERY DAY 90 tablet 0   No current facility-administered medications on file prior to visit.      Review of Systems    Review of Systems  Constitutional: Negative for fever, appetite change,  and unexpected weight change. pos for more fatigue if he gets overheated  Eyes: Negative for pain and visual disturbance.  Respiratory: Negative for cough and shortness of breath.   Cardiovascular: Negative for cp or palpitations    Gastrointestinal: Negative for nausea, diarrhea and constipation.  Genitourinary: Negative for urgency and frequency.  Skin: Negative for pallor or rash   Neurological: Negative for weakness, light-headedness, numbness and  headaches.  Hematological: Negative for adenopathy. Does not bruise/bleed easily.  Psychiatric/Behavioral: Negative for dysphoric mood. The patient is not nervous/anxious.       Objective:   Physical Exam  Constitutional: He is oriented to person, place, and time. He appears well-developed and well-nourished. No distress.  Well appearing (looks younger than stated age)   HENT:  Head: Normocephalic and atraumatic.  Right Ear: External ear normal.  Left Ear: External ear normal.  Nose: Nose normal.  Mouth/Throat: Oropharynx is clear and moist.  Eyes: Conjunctivae and EOM are normal. Pupils are equal, round, and reactive to light. Right eye exhibits no discharge. Left eye exhibits no discharge. No scleral icterus.  Neck: Normal range of motion. Neck supple. No JVD present. Carotid bruit is not present. No thyromegaly present.  Cardiovascular: Normal rate, regular rhythm, normal heart sounds and intact distal pulses.  Exam reveals no gallop.   Pulmonary/Chest: Effort normal and breath sounds normal. No respiratory distress. He has no wheezes. He has no rales.  Abdominal: Soft. Bowel sounds are normal. He exhibits no distension, no abdominal bruit and no mass. There is no tenderness.  Musculoskeletal: Normal range of motion. He exhibits no edema or tenderness.  Lymphadenopathy:    He has no cervical adenopathy.  Neurological: He is alert and oriented to person, place, and time. He has normal reflexes. No cranial nerve deficit. He exhibits normal muscle tone. Coordination normal.  Skin: Skin is warm and dry. No rash noted. No erythema. No pallor.  Psychiatric: He has a normal mood and affect.          Assessment & Plan:   Problem List Items Addressed This Visit    BPH (benign prostatic hyperplasia)    Symptoms well controlled with flomax No changes Lab Results  Component Value Date   PSA 1.21 10/23/2014   PSA 1.41 08/12/2013   PSA  1.16 12/31/2009   stable and re assuring        Elevated alkaline phosphatase level    Alk phos 145 No symptoms  Bili 1.3 (last 1.1) Unsure if significant  Will rep in 1-2 mo with isoenzymes and liver panel  Of note-past ast/alt elevation  Hx of gallbladder sludge in past w/o symptoms and gastrectomy in the past      Relevant Orders   Hepatic function panel   Alkaline Phosphatase, Isoenzymes   Encounter for Medicare annual wellness exam - Primary    Reviewed health habits including diet and exercise and skin cancer prevention Reviewed appropriate screening tests for age  Also reviewed health mt list, fam hx and immunization status , as well as social and family history   See HPI Labs reviewed Increase your water intake - for general health and kidney health  prevnar vaccine today (pneumonia vaccine booster) Please work on an advance directive (look at the blue packet I gave you)      Essential hypertension    bp in fair control at this time  BP Readings from Last 1 Encounters:  10/28/14 130/60   No changes needed Disc lifstyle change with low sodium diet and exercise  Labs reviewed       Hyperlipidemia    Disc goals for lipids and reasons to control them Rev labs with pt Rev low sat fat diet in detail Well controlled with atorvastatin and diet  Great exercise habits also       Routine general medical examination at a health care facility    Reviewed health habits including diet and exercise and skin cancer prevention Reviewed appropriate screening tests for age  Also reviewed health mt list, fam hx and immunization status , as well as social and family history   See HPI Labs reviewed Increase your water intake - for general health and kidney health  prevnar vaccine today (pneumonia vaccine booster) Please work on an advance directive (look at the blue packet I gave you)

## 2014-10-28 NOTE — Assessment & Plan Note (Signed)
Symptoms well controlled with flomax No changes Lab Results  Component Value Date   PSA 1.21 10/23/2014   PSA 1.41 08/12/2013   PSA 1.16 12/31/2009   stable and re assuring

## 2014-10-28 NOTE — Assessment & Plan Note (Addendum)
Alk phos 145 No symptoms  Bili 1.3 (last 1.1) Unsure if significant  Will rep in 1-2 mo with isoenzymes and liver panel  Of note-past ast/alt elevation  Hx of gallbladder sludge in past w/o symptoms and gastrectomy in the past

## 2014-10-28 NOTE — Assessment & Plan Note (Signed)
bp in fair control at this time  BP Readings from Last 1 Encounters:  10/28/14 130/60   No changes needed Disc lifstyle change with low sodium diet and exercise  Labs reviewed

## 2014-12-29 ENCOUNTER — Other Ambulatory Visit (INDEPENDENT_AMBULATORY_CARE_PROVIDER_SITE_OTHER): Payer: Medicare Other

## 2014-12-29 DIAGNOSIS — R748 Abnormal levels of other serum enzymes: Secondary | ICD-10-CM

## 2014-12-30 ENCOUNTER — Other Ambulatory Visit: Payer: Medicare Other

## 2014-12-30 LAB — HEPATIC FUNCTION PANEL
ALBUMIN: 4.1 g/dL (ref 3.5–5.2)
ALK PHOS: 132 U/L — AB (ref 39–117)
ALT: 29 U/L (ref 0–53)
AST: 24 U/L (ref 0–37)
BILIRUBIN DIRECT: 0.2 mg/dL (ref 0.0–0.3)
Total Bilirubin: 1.2 mg/dL (ref 0.2–1.2)
Total Protein: 6.6 g/dL (ref 6.0–8.3)

## 2014-12-31 DIAGNOSIS — R748 Abnormal levels of other serum enzymes: Secondary | ICD-10-CM | POA: Diagnosis not present

## 2014-12-31 NOTE — Addendum Note (Signed)
Addended by: Ellamae Sia on: 12/31/2014 01:08 PM   Modules accepted: Orders

## 2015-01-06 LAB — ALKALINE PHOSPHATASE ISOENZYMES
Alkaline Phonsphatase: 129 U/L — ABNORMAL HIGH (ref 40–115)
BONE ISOENZYMES (ALP ISO): 57 % (ref 28–66)
INTESTINAL ISOENZYMES (ALP ISO): 20 % (ref 1–24)
LIVER ISOENZYMES (ALP ISO): 23 % — AB (ref 25–69)
Macrohepatic isoenzymes: 0 %

## 2015-01-13 ENCOUNTER — Other Ambulatory Visit: Payer: Self-pay | Admitting: Family Medicine

## 2015-01-19 DIAGNOSIS — H04123 Dry eye syndrome of bilateral lacrimal glands: Secondary | ICD-10-CM | POA: Diagnosis not present

## 2015-01-19 DIAGNOSIS — H25012 Cortical age-related cataract, left eye: Secondary | ICD-10-CM | POA: Diagnosis not present

## 2015-01-19 DIAGNOSIS — H35352 Cystoid macular degeneration, left eye: Secondary | ICD-10-CM | POA: Diagnosis not present

## 2015-02-22 DIAGNOSIS — H04123 Dry eye syndrome of bilateral lacrimal glands: Secondary | ICD-10-CM | POA: Diagnosis not present

## 2015-04-06 DIAGNOSIS — H16223 Keratoconjunctivitis sicca, not specified as Sjogren's, bilateral: Secondary | ICD-10-CM | POA: Diagnosis not present

## 2015-05-28 ENCOUNTER — Ambulatory Visit (INDEPENDENT_AMBULATORY_CARE_PROVIDER_SITE_OTHER): Payer: Medicare Other

## 2015-05-28 DIAGNOSIS — Z961 Presence of intraocular lens: Secondary | ICD-10-CM | POA: Diagnosis not present

## 2015-05-28 DIAGNOSIS — H25011 Cortical age-related cataract, right eye: Secondary | ICD-10-CM | POA: Diagnosis not present

## 2015-05-28 DIAGNOSIS — H401130 Primary open-angle glaucoma, bilateral, stage unspecified: Secondary | ICD-10-CM | POA: Diagnosis not present

## 2015-05-28 DIAGNOSIS — Z23 Encounter for immunization: Secondary | ICD-10-CM | POA: Diagnosis not present

## 2015-05-28 DIAGNOSIS — H2511 Age-related nuclear cataract, right eye: Secondary | ICD-10-CM | POA: Diagnosis not present

## 2015-05-28 DIAGNOSIS — H25041 Posterior subcapsular polar age-related cataract, right eye: Secondary | ICD-10-CM | POA: Diagnosis not present

## 2015-06-15 ENCOUNTER — Ambulatory Visit: Payer: Medicare Other | Admitting: Primary Care

## 2015-06-19 ENCOUNTER — Other Ambulatory Visit: Payer: Self-pay | Admitting: Family Medicine

## 2015-06-21 ENCOUNTER — Ambulatory Visit (INDEPENDENT_AMBULATORY_CARE_PROVIDER_SITE_OTHER): Payer: Medicare Other | Admitting: Internal Medicine

## 2015-06-21 ENCOUNTER — Encounter: Payer: Self-pay | Admitting: Internal Medicine

## 2015-06-21 VITALS — BP 146/84 | HR 58 | Temp 97.8°F | Wt 180.0 lb

## 2015-06-21 DIAGNOSIS — L732 Hidradenitis suppurativa: Secondary | ICD-10-CM

## 2015-06-21 MED ORDER — DOXYCYCLINE HYCLATE 100 MG PO TABS: 100.0000 mg | ORAL_TABLET | Freq: Two times a day (BID) | ORAL | Status: DC

## 2015-06-21 NOTE — Progress Notes (Signed)
Subjective:    Patient ID: Jerry Farrell, male    DOB: 04/20/1946, 70 y.o.   MRN: [id number]  HPI  Pt presents to the clinic today with c/o redness and swelling of his left armpit. He noticed this 2-3 weeks ago. He reports it started out as a bump. The bump has gotten larger in size. The area is very tender to touch. He denies fever, chills, or nausea. He has tried warm compresses without relief. He has had this in the past, in the other armpit.  Review of Systems      Past Medical History  Diagnosis Date  . Transfusion history   . CAD (coronary artery disease)     cardiolite ok 3/05, stress test- low risk study 11/06  . Hyperlipidemia   . Hypertension   . ED (erectile dysfunction)   . Chronic low back pain     with radiculopathy  . Gallstones     abd [place]- stable hamangioma, gallbladder sludge and tiny stones 09/2003  . GERD (gastroesophageal reflux disease)     Current Outpatient Prescriptions  Medication Sig Dispense Refill  . aspirin 325 MG EC tablet Take 325 mg by mouth daily.    Marland Kitchen atenolol (TENORMIN) 50 MG tablet TAKE 1 TABLET (50 MG TOTAL) BY MOUTH DAILY. 90 tablet 2  . atorvastatin (LIPITOR) 10 MG tablet TAKE 1 TABLET BY MOUTH EVERY DAY 90 tablet 2  . Bromfenac Sodium 0.09 % SOLN Use as directed    . dicyclomine (BENTYL) 10 MG capsule Take 1 capsule (10 mg total) by mouth 3 (three) times daily as needed for spasms. 90 capsule 5  . dorzolamide-timolol (COSOPT) 22.3-6.8 MG/ML ophthalmic solution Use as directed    . omeprazole (PRILOSEC) 20 MG capsule TAKE ONE CAPSULE BY MOUTH ONCE A DAY AS NEEDED FOR REFLUX 90 capsule 2  . tadalafil (CIALIS) 20 MG tablet Take one by mouth up to every 3 days as needed before sexual activity.     . tamsulosin (FLOMAX) 0.4 MG CAPS capsule TAKE 1 CAPSULE (0.4 MG TOTAL) BY MOUTH DAILY. 90 capsule 1  . valsartan-hydrochlorothiazide (DIOVAN-HCT) 80-12.5 MG per tablet TAKE 1 TABLET BY MOUTH EVERY DAY 90 tablet 2   No current  facility-administered medications for this visit.    Allergies  Allergen Reactions  . Atorvastatin     REACTION: elevated LFT's    Family History  Problem Relation Age of Onset  . Hypertension Mother   . Cancer Sister     unknown type, pt feels alcohol related  . Cancer Brother     colon  . Cancer Brother     throat, pt feels alcohol related    Social History   Social History  . Marital Status: Married    Spouse Name: N/A  . Number of Children: 3  . Years of Education: N/A   Occupational History  . [employer]    Social History Main Topics  . Smoking status: Never Smoker   . Smokeless tobacco: Never Used  . Alcohol Use: No  . Drug Use: No  . Sexual Activity: Not on file   Other Topics Concern  . Not on file   Social History Narrative     Constitutional: Denies fever, malaise, fatigue, headache or abrupt weight changes.  Skin: Pt reports a bump under his left armpit, with redness, swelling and pain. Denies rashes, or ulcercations.    No other specific complaints in a complete review of systems (except as listed in HPI above).  Objective:   Physical Exam  BP 146/84 mmHg  Pulse 58  Temp(Src) 97.8 F (36.6 C) (Oral)  Wt 180 lb (81.647 kg)  SpO2 98% Wt Readings from Last 3 Encounters:  06/21/15 180 lb (81.647 kg)  10/28/14 182 lb 1.9 oz (82.609 kg)  06/12/14 185 lb 4 oz (84.029 kg)    General: Appears his stated age, well developed, well nourished in NAD. Skin: Multiple pustular nodules noted in the left armpit, with surrounding redness, no warmth, mild tenderness. Cardiovascular: Normal rate and rhythm. S1,S2 noted.   Pulmonary/Chest: Normal effort and positive vesicular breath sounds. No respiratory distress. No wheezes, rales or ronchi noted.    BMET    Component Value Date/Time   NA 139 10/23/2014 0742   K 4.3 10/23/2014 0742   CL 103 10/23/2014 0742   CO2 30 10/23/2014 0742   GLUCOSE 94 10/23/2014 0742   BUN 15 10/23/2014 0742    CREATININE 1.02 10/23/2014 0742   CREATININE 0.98 06/12/2014 1433   CALCIUM 9.0 10/23/2014 0742   GFRNONAA 85* 03/06/2012 0837   GFRAA >90 03/06/2012 0837    Lipid Panel     Component Value Date/Time   CHOL 122 10/23/2014 0742   TRIG 93.0 10/23/2014 0742   HDL 41.60 10/23/2014 0742   CHOLHDL 3 10/23/2014 0742   VLDL 18.6 10/23/2014 0742   LDLCALC 62 10/23/2014 0742    CBC    Component Value Date/Time   WBC 7.0 10/23/2014 0742   RBC 5.17 10/23/2014 0742   HGB 15.2 10/23/2014 0742   HCT 44.7 10/23/2014 0742   PLT 140.0* 10/23/2014 0742   MCV 86.3 10/23/2014 0742   MCH 29.6 03/06/2012 0837   MCHC 34.0 10/23/2014 0742   RDW 13.5 10/23/2014 0742   LYMPHSABS 1.3 10/23/2014 0742   MONOABS 0.4 10/23/2014 0742   EOSABS 0.1 10/23/2014 0742   BASOSABS 0.0 10/23/2014 0742    Hgb A1C No results found for: HGBA1C      Assessment & Plan:   Hydradenitis Axillaris:  Infected eRx for Doxycycline 100 mg BID x 10 days Ibuprofen as needed for pain and inflammation Continue warm compresses  RTC as needed or if symptoms persist or worsen

## 2015-06-21 NOTE — Patient Instructions (Signed)

## 2015-06-21 NOTE — Progress Notes (Signed)
Pre visit review using our clinic review tool, if applicable. No additional management support is needed unless otherwise documented below in the visit note. 

## 2015-07-01 ENCOUNTER — Other Ambulatory Visit: Payer: Self-pay | Admitting: Internal Medicine

## 2015-07-02 NOTE — Telephone Encounter (Signed)
Last filled 06/21/15 for Hidradenitis axillaris--please advise if pt needs f/u appt

## 2015-07-05 DIAGNOSIS — H16223 Keratoconjunctivitis sicca, not specified as Sjogren's, bilateral: Secondary | ICD-10-CM | POA: Diagnosis not present

## 2015-07-05 DIAGNOSIS — H04123 Dry eye syndrome of bilateral lacrimal glands: Secondary | ICD-10-CM | POA: Diagnosis not present

## 2015-07-20 ENCOUNTER — Encounter: Payer: Self-pay | Admitting: Family Medicine

## 2015-07-20 ENCOUNTER — Ambulatory Visit (INDEPENDENT_AMBULATORY_CARE_PROVIDER_SITE_OTHER): Payer: Medicare Other | Admitting: Family Medicine

## 2015-07-20 VITALS — BP 138/78 | HR 49 | Temp 98.3°F | Ht 69.0 in | Wt 181.8 lb

## 2015-07-20 DIAGNOSIS — K219 Gastro-esophageal reflux disease without esophagitis: Secondary | ICD-10-CM | POA: Diagnosis not present

## 2015-07-20 DIAGNOSIS — R109 Unspecified abdominal pain: Secondary | ICD-10-CM | POA: Diagnosis not present

## 2015-07-20 MED ORDER — OMEPRAZOLE 40 MG PO CPDR: 40.0000 mg | DELAYED_RELEASE_CAPSULE | Freq: Every day | ORAL | Status: DC

## 2015-07-20 NOTE — Patient Instructions (Signed)
Try to avoid fatty and spicy food and caffeine  Increase your prilosec (omeprazole) from 20 to 40 mg daily  Stop at check out for referral to GI  If symptoms change or worsen in the meantime let me know

## 2015-07-20 NOTE — Progress Notes (Signed)
Subjective:    Patient ID: Jerry Farrell, male    DOB: May 27, 1945, 70 y.o.   MRN: [id number]  HPI Here with GI complaints 1-2 wk   No n/v/d  Stomach has been hurting him  Thinks it comes from greasy foods Gas-belching Stomach bubbles a lot  Heartburn feeling   Has hx of partial gastrectomy from PUD Also gallstones   Is on prilosec 20 mg daily   No blood in stool or dark stool  A little more constipated   Was eating more greasy food at church events -a week ago    Patient Active Problem List   Diagnosis Date Noted  . GERD (gastroesophageal reflux disease) 07/20/2015  . Abdominal cramps 07/20/2015  . Routine general medical examination at a health care facility 10/28/2014  . Elevated alkaline phosphatase level 10/28/2014  . Urinary frequency 04/29/2014  . BPH (benign prostatic hyperplasia) 04/29/2014  . Prostate cancer screening 08/11/2013  . Encounter for Medicare annual wellness exam 08/11/2013  . TRANSAMINASES, SERUM, ELEVATED 04/05/2010  . Hyperlipidemia 01/09/2007  . ERECTILE DYSFUNCTION 01/09/2007  . Essential hypertension 01/09/2007  . CORONARY ARTERY DISEASE 01/09/2007  . ESOPHAGITIS 01/09/2007  . BACK PAIN, LUMBAR, WITH RADICULOPATHY 01/09/2007   Past Medical History  Diagnosis Date  . Transfusion history   . CAD (coronary artery disease)     cardiolite ok 3/05, stress test- low risk study 11/06  . Hyperlipidemia   . Hypertension   . ED (erectile dysfunction)   . Chronic low back pain     with radiculopathy  . Gallstones     abd [place]- stable hamangioma, gallbladder sludge and tiny stones 09/2003  . GERD (gastroesophageal reflux disease)    Past Surgical History  Procedure Laterality Date  . Gastrectomy      partial  . Coronary artery bypass graft  1998    x 1  done [facility]  . Pars plana vitrectomy  03/13/2012    Procedure: PARS PLANA VITRECTOMY WITH 23 GAUGE;  Surgeon: Adonis Brook, MD;  Location: [facility];  Service: Ophthalmology;   Laterality: Left;  Marland Kitchen Membrane peel  03/13/2012    Procedure: MEMBRANE PEEL;  Surgeon: Adonis Brook, MD;  Location: [facility];  Service: Ophthalmology;  Laterality: Left;  . Cataract extraction w/phaco  03/13/2012    Procedure: CATARACT EXTRACTION PHACO AND INTRAOCULAR LENS PLACEMENT (IOC);  Surgeon: Adonis Brook, MD;  Location: [facility];  Service: Ophthalmology;  Laterality: Left;   Social History  Substance Use Topics  . Smoking status: Never Smoker   . Smokeless tobacco: Never Used  . Alcohol Use: No   Family History  Problem Relation Age of Onset  . Hypertension Mother   . Cancer Sister     unknown type, pt feels alcohol related  . Cancer Brother     colon  . Cancer Brother     throat, pt feels alcohol related   Allergies  Allergen Reactions  . Atorvastatin     REACTION: elevated LFT's   Current Outpatient Prescriptions on File Prior to Visit  Medication Sig Dispense Refill  . aspirin 325 MG EC tablet Take 325 mg by mouth daily.    Marland Kitchen atenolol (TENORMIN) 50 MG tablet TAKE 1 TABLET (50 MG TOTAL) BY MOUTH DAILY. 90 tablet 2  . atorvastatin (LIPITOR) 10 MG tablet TAKE 1 TABLET BY MOUTH EVERY DAY 90 tablet 2  . Bromfenac Sodium 0.09 % SOLN Use as directed    . dorzolamide-timolol (COSOPT) 22.3-6.8 MG/ML ophthalmic solution  Use as directed    . tadalafil (CIALIS) 20 MG tablet Take one by mouth up to every 3 days as needed before sexual activity.     . tamsulosin (FLOMAX) 0.4 MG CAPS capsule TAKE 1 CAPSULE (0.4 MG TOTAL) BY MOUTH DAILY. 90 capsule 1  . valsartan-hydrochlorothiazide (DIOVAN-HCT) 80-12.5 MG per tablet TAKE 1 TABLET BY MOUTH EVERY DAY 90 tablet 2   No current facility-administered medications on file prior to visit.    Review of Systems Review of Systems  Constitutional: Negative for fever, appetite change, fatigue and unexpected weight change.  Eyes: Negative for pain and visual disturbance.  Respiratory: Negative for cough and shortness of breath.   Cardiovascular:  Negative for cp or palpitations    Gastrointestinal: Negative for  diarrhea and constipation. neg for blood in stool or black stool, pos for low abd cramping and heartburn, neg for vomiting Genitourinary: Negative for urgency and frequency. neg for dysuria or hematuria  Skin: Negative for pallor or rash   Neurological: Negative for weakness, light-headedness, numbness and headaches.  Hematological: Negative for adenopathy. Does not bruise/bleed easily.  Psychiatric/Behavioral: Negative for dysphoric mood. The patient is not nervous/anxious.         Objective:   Physical Exam  Constitutional: He appears well-developed and well-nourished. No distress.  Well appearing   HENT:  Head: Normocephalic and atraumatic.  Mouth/Throat: Oropharynx is clear and moist.  Eyes: Conjunctivae and EOM are normal. Pupils are equal, round, and reactive to light. No scleral icterus.  Neck: Normal range of motion. Neck supple.  Cardiovascular: Normal rate, regular rhythm and normal heart sounds.   Pulmonary/Chest: Effort normal and breath sounds normal. No respiratory distress. He has no wheezes. He has no rales.  Abdominal: Soft. Normal aorta and bowel sounds are normal. He exhibits no distension, no pulsatile midline mass and no mass. There is no hepatosplenomegaly. There is tenderness in the epigastric area. There is no rigidity, no rebound, no guarding, no CVA tenderness, no tenderness at McBurney's point and negative Murphy's sign.  Baseline abdominal scar   Lymphadenopathy:    He has no cervical adenopathy.  Neurological: He is alert.  Skin: Skin is warm and dry. No erythema. No pallor.  Psychiatric: He has a normal mood and affect.          Assessment & Plan:   Problem List Items Addressed This Visit      Digestive   GERD (gastroesophageal reflux disease) - Primary    Causing belching and heartburn and abd discomfort in pt with hx of past PUD and gastrectomy  Already on omeprazole 20 mg  daily- will inc that to 40 mg daily  Handout on diet-what to avoid Will stop fatty foods  Ref to GI in light of history  Update if symptoms worsen in the meantime       Relevant Medications   omeprazole (PRILOSEC) 40 MG capsule   Other Relevant Orders   Ambulatory referral to Gastroenterology     Other   Abdominal cramps    Unsure if related to recent diet (greasy foods) or to his gerd acting up  Disc diet -inc fiber and fluids  Re to GI for this and also GERD Last colonoscopy was 2009- rev      Relevant Orders   Ambulatory referral to Gastroenterology

## 2015-07-20 NOTE — Progress Notes (Signed)
Pre visit review using our clinic review tool, if applicable. No additional management support is needed unless otherwise documented below in the visit note. 

## 2015-07-21 ENCOUNTER — Ambulatory Visit (INDEPENDENT_AMBULATORY_CARE_PROVIDER_SITE_OTHER): Payer: Medicare Other | Admitting: Gastroenterology

## 2015-07-21 ENCOUNTER — Encounter: Payer: Self-pay | Admitting: Gastroenterology

## 2015-07-21 VITALS — BP 148/86 | HR 58 | Ht 68.0 in | Wt 183.0 lb

## 2015-07-21 DIAGNOSIS — K589 Irritable bowel syndrome without diarrhea: Secondary | ICD-10-CM

## 2015-07-21 DIAGNOSIS — K219 Gastro-esophageal reflux disease without esophagitis: Secondary | ICD-10-CM | POA: Diagnosis not present

## 2015-07-21 MED ORDER — DICYCLOMINE HCL 10 MG PO CAPS: 10.0000 mg | ORAL_CAPSULE | Freq: Three times a day (TID) | ORAL | Status: DC | PRN

## 2015-07-21 NOTE — Patient Instructions (Signed)
We have sent the following medications to your pharmacy for you to pick up at your convenience: Bentyl.  Thank you for choosing me and [facility].  Malcolm T. Stark, Jr., MD., FACG   

## 2015-07-21 NOTE — Progress Notes (Signed)
    History of Present Illness: This is a 70 year old male who notes worsening reflux symptoms and worsening problems with intermittent abdominal cramping. He states he has had more fried and greasy foods over the past few weeks than he typically eats and he feels this is caused his current symptoms. His omeprazole was increased from 20 mg daily to 40 mg daily yesterday by his PCP and his reflux symptoms appear to be under better control. He has not been taking dicyclomine as recommended.  Current Medications, Allergies, Past Medical History, Past Surgical History, Family History and Social History were reviewed in [employer] record.  Physical Exam: General: Well developed, well nourished, no acute distress Head: Normocephalic and atraumatic Eyes:  sclerae anicteric, EOMI Ears: Normal auditory acuity Mouth: No deformity or lesions Lungs: Clear throughout to auscultation Heart: Regular rate and rhythm; no murmurs, rubs or bruits Abdomen: Soft, non tender and non distended. No masses, hepatosplenomegaly or hernias noted. Normal Bowel sounds Musculoskeletal: Symmetrical with no gross deformities  Pulses:  Normal pulses noted Extremities: No clubbing, cyanosis, edema or deformities noted Neurological: Alert oriented x 4, grossly nonfocal Psychological:  Alert and cooperative. Normal mood and affect  Assessment and Recommendations:  1. IBS. Minimize or avoid fried and greasy foods. Avoid other foods that trigger symptoms. Resume the use of dicyclomine 10 mg 3 times a day as needed for abdominal pain, bloating, cramping.  2. GERD. Closely follow all antireflux measures including minimizing or avoiding fried and greasy foods. Continue omeprazole 40 mg daily.

## 2015-07-24 NOTE — Assessment & Plan Note (Signed)
Causing belching and heartburn and abd discomfort in pt with hx of past PUD and gastrectomy  Already on omeprazole 20 mg daily- will inc that to 40 mg daily  Handout on diet-what to avoid Will stop fatty foods  Ref to GI in light of history  Update if symptoms worsen in the meantime

## 2015-07-24 NOTE — Assessment & Plan Note (Signed)
Unsure if related to recent diet (greasy foods) or to his gerd acting up  Disc diet -inc fiber and fluids  Re to GI for this and also GERD Last colonoscopy was 2009- rev

## 2015-10-12 ENCOUNTER — Other Ambulatory Visit: Payer: Self-pay | Admitting: Family Medicine

## 2015-10-14 ENCOUNTER — Other Ambulatory Visit: Payer: Self-pay | Admitting: Family Medicine

## 2015-10-14 MED ORDER — ATENOLOL 50 MG PO TABS: ORAL_TABLET | ORAL | Status: DC

## 2015-10-14 NOTE — Addendum Note (Signed)
Addended by: Tammi Sou on: 10/14/2015 09:12 AM   Modules accepted: Orders

## 2015-11-24 ENCOUNTER — Other Ambulatory Visit: Payer: Self-pay | Admitting: Gastroenterology

## 2015-11-25 DIAGNOSIS — H401130 Primary open-angle glaucoma, bilateral, stage unspecified: Secondary | ICD-10-CM | POA: Diagnosis not present

## 2015-11-25 DIAGNOSIS — H2511 Age-related nuclear cataract, right eye: Secondary | ICD-10-CM | POA: Diagnosis not present

## 2015-11-25 DIAGNOSIS — Z961 Presence of intraocular lens: Secondary | ICD-10-CM | POA: Diagnosis not present

## 2015-12-10 ENCOUNTER — Ambulatory Visit (INDEPENDENT_AMBULATORY_CARE_PROVIDER_SITE_OTHER): Payer: Medicare Other | Admitting: Family Medicine

## 2015-12-10 ENCOUNTER — Encounter: Payer: Self-pay | Admitting: Family Medicine

## 2015-12-10 VITALS — BP 138/70 | HR 47 | Temp 98.4°F | Ht 68.0 in | Wt 181.5 lb

## 2015-12-10 DIAGNOSIS — R0982 Postnasal drip: Secondary | ICD-10-CM | POA: Diagnosis not present

## 2015-12-10 DIAGNOSIS — I10 Essential (primary) hypertension: Secondary | ICD-10-CM | POA: Diagnosis not present

## 2015-12-10 DIAGNOSIS — I2583 Coronary atherosclerosis due to lipid rich plaque: Secondary | ICD-10-CM | POA: Diagnosis not present

## 2015-12-10 DIAGNOSIS — I2510 Atherosclerotic heart disease of native coronary artery without angina pectoris: Secondary | ICD-10-CM | POA: Diagnosis not present

## 2015-12-10 NOTE — Patient Instructions (Addendum)
Get a new blood pressure monitor - OMRON for the arm - size regular and start randomly checking blood pressures when you are relaxed and feeling well  For throat symptoms- try plain claritin 10 mg daily as needed (not the D !)  Stop at check out for referral to cardiology - take your blood pressure cuff with you to that appt.  Let me know if at home your blood pressure is staying at or above 140 on the top or 90 on the bottom   Avoid processed and salty foods  Keep walking!

## 2015-12-10 NOTE — Assessment & Plan Note (Signed)
With occ hoarseness and throat discomfort  No other sinus symptoms  Suggest antihistamine during season - like claritin  Also direct fan away from his bed at night

## 2015-12-10 NOTE — Progress Notes (Signed)
Subjective:    Patient ID: Jerry Farrell, male    DOB: 08/04/1945, 70 y.o.   MRN: [id number]  HPI Here for uri symptoms  For the past week- very dry throat - and that started to improve -not sore  Started to get hoarse (sings in the choir)  Clears throat a fair amount  Stuffy nose -especially in the mornings (he thought it was from fan in his room)  No sinus pain  Mucous- ? If any color to it  No ear pain  A little cough-not bad or productive  No fever   ? If he has some mild environmental allergies   Pain under L shoulder blade -occasionally  On and off  He can hear a pop when he bends and moves arm  Was doing heavy duty work on a car- thinks that is what is causing it    Noted bp is up  BP Readings from Last 3 Encounters:  12/10/15 (!) 150/78  07/21/15 (!) 148/86  07/20/15 138/78  needs a new home cuff-will buy one today No symptoms  Better on 2nd check today 138/70   Patient Active Problem List   Diagnosis Date Noted  . Post-nasal drip 12/10/2015  . GERD (gastroesophageal reflux disease) 07/20/2015  . Abdominal cramps 07/20/2015  . Routine general medical examination at a health care facility 10/28/2014  . Elevated alkaline phosphatase level 10/28/2014  . Urinary frequency 04/29/2014  . BPH (benign prostatic hyperplasia) 04/29/2014  . Prostate cancer screening 08/11/2013  . Encounter for Medicare annual wellness exam 08/11/2013  . TRANSAMINASES, SERUM, ELEVATED 04/05/2010  . Hyperlipidemia 01/09/2007  . ERECTILE DYSFUNCTION 01/09/2007  . Essential hypertension 01/09/2007  . Coronary atherosclerosis 01/09/2007  . ESOPHAGITIS 01/09/2007  . BACK PAIN, LUMBAR, WITH RADICULOPATHY 01/09/2007   Past Medical History:  Diagnosis Date  . CAD (coronary artery disease)    cardiolite ok 3/05, stress test- low risk study 11/06  . Chronic low back pain    with radiculopathy  . ED (erectile dysfunction)   . Gallstones    abd [place]- stable hamangioma, gallbladder sludge  and tiny stones 09/2003  . GERD (gastroesophageal reflux disease)   . Hyperlipidemia   . Hypertension   . Transfusion history    Past Surgical History:  Procedure Laterality Date  . CATARACT EXTRACTION W/PHACO  03/13/2012   Procedure: CATARACT EXTRACTION PHACO AND INTRAOCULAR LENS PLACEMENT (IOC);  Surgeon: Adonis Brook, MD;  Location: [facility];  Service: Ophthalmology;  Laterality: Left;  . CORONARY ARTERY BYPASS GRAFT  1998   x 1  done [facility]  . GASTRECTOMY     partial  . MEMBRANE PEEL  03/13/2012   Procedure: MEMBRANE PEEL;  Surgeon: Adonis Brook, MD;  Location: [facility];  Service: Ophthalmology;  Laterality: Left;  . PARS PLANA VITRECTOMY  03/13/2012   Procedure: PARS PLANA VITRECTOMY WITH 23 GAUGE;  Surgeon: Adonis Brook, MD;  Location: [facility];  Service: Ophthalmology;  Laterality: Left;   Social History  Substance Use Topics  . Smoking status: Never Smoker  . Smokeless tobacco: Never Used  . Alcohol use No   Family History  Problem Relation Age of Onset  . Hypertension Mother   . Cancer Sister     unknown type, pt feels alcohol related  . Cancer Brother     colon  . Cancer Brother     throat, pt feels alcohol related   Allergies  Allergen Reactions  . Atorvastatin     REACTION: elevated  LFT's   Current Outpatient Prescriptions on File Prior to Visit  Medication Sig Dispense Refill  . aspirin 325 MG EC tablet Take 325 mg by mouth daily.    Marland Kitchen atenolol (TENORMIN) 50 MG tablet TAKE 1 TABLET (50 MG TOTAL) BY MOUTH DAILY. 90 tablet 0  . atorvastatin (LIPITOR) 10 MG tablet TAKE 1 TABLET BY MOUTH EVERY DAY 90 tablet 0  . Bromfenac Sodium 0.09 % SOLN Use as directed    . dicyclomine (BENTYL) 10 MG capsule Take 1 capsule (10 mg total) by mouth 3 (three) times daily as needed for spasms. 90 capsule 11  . dicyclomine (BENTYL) 10 MG capsule TAKE 1 CAPSULE BY MOUTH 3 TIMES DAILY AS NEEDED FOR SPASMS. 90 capsule 2  . dorzolamide-timolol (COSOPT) 22.3-6.8 MG/ML ophthalmic solution  Use as directed    . omeprazole (PRILOSEC) 40 MG capsule Take 1 capsule (40 mg total) by mouth daily. 30 capsule 11  . tadalafil (CIALIS) 20 MG tablet Take one by mouth up to every 3 days as needed before sexual activity.     . tamsulosin (FLOMAX) 0.4 MG CAPS capsule TAKE 1 CAPSULE (0.4 MG TOTAL) BY MOUTH DAILY. 90 capsule 1  . valsartan-hydrochlorothiazide (DIOVAN-HCT) 80-12.5 MG tablet TAKE 1 TABLET BY MOUTH EVERY DAY 90 tablet 0   No current facility-administered medications on file prior to visit.      Review of Systems Review of Systems  Constitutional: Negative for fever, appetite change, fatigue and unexpected weight change.  Eyes: Negative for pain and visual disturbance. ENT pos for throat discomfort and pnd and throat clearing   Respiratory: Negative for cough and shortness of breath.   Cardiovascular: Negative for cp or palpitations    Gastrointestinal: Negative for nausea, diarrhea and constipation. neg for heartburn  Genitourinary: Negative for urgency and frequency.  Skin: Negative for pallor or rash   Neurological: Negative for weakness, light-headedness, numbness and headaches.  Hematological: Negative for adenopathy. Does not bruise/bleed easily.  Psychiatric/Behavioral: Negative for dysphoric mood. The patient is not nervous/anxious.         Objective:   Physical Exam  Constitutional: He appears well-developed and well-nourished. No distress.  Well appearing   HENT:  Head: Normocephalic and atraumatic.  Right Ear: External ear normal.  Left Ear: External ear normal.  Mouth/Throat: Oropharynx is clear and moist.  Mild clear pnd  Throat appears normal w/o swelling or lesions or erythema  Nares are boggy No sinus tenderness  Eyes: Conjunctivae and EOM are normal. Pupils are equal, round, and reactive to light.  Neck: Normal range of motion. Neck supple. No JVD present. Carotid bruit is not present. No thyromegaly present.  Cardiovascular: Normal rate, regular  rhythm, normal heart sounds and intact distal pulses.  Exam reveals no gallop.   Pulmonary/Chest: Effort normal and breath sounds normal. No respiratory distress. He has no wheezes. He has no rales. He exhibits no tenderness.  No crackles  Abdominal: Soft. Bowel sounds are normal. He exhibits no distension, no abdominal bruit and no mass. There is no tenderness.  Musculoskeletal: He exhibits no edema.  No TS or LS tenderness Nl rom spine and arms   Lymphadenopathy:    He has no cervical adenopathy.  Neurological: He is alert. He has normal reflexes. No cranial nerve deficit. He exhibits normal muscle tone. Coordination normal.  Skin: Skin is warm and dry. No rash noted.  Psychiatric: He has a normal mood and affect.          Assessment &  Plan:   Problem List Items Addressed This Visit      Cardiovascular and Mediastinum   Essential hypertension - Primary    bp in fair control at this time -on 2nd check  BP Readings from Last 1 Encounters:  12/10/15 138/70  ref made for routine cardiology f/u  No changes needed for now He will get a new cuff and start checking random bp readings  Disc lifstyle change with low sodium diet and exercise        Relevant Orders   Ambulatory referral to Cardiology   Coronary atherosclerosis    Due for routine cardiology f/u Continue bp and cholesterol control  Ref done       Relevant Orders   Ambulatory referral to Cardiology     Other   Post-nasal drip    With occ hoarseness and throat discomfort  No other sinus symptoms  Suggest antihistamine during season - like claritin  Also direct fan away from his bed at night         Other Visit Diagnoses   None.

## 2015-12-10 NOTE — Progress Notes (Signed)
Pre visit review using our clinic review tool, if applicable. No additional management support is needed unless otherwise documented below in the visit note. 

## 2015-12-12 NOTE — Assessment & Plan Note (Signed)
Due for routine cardiology f/u Continue bp and cholesterol control  Ref done

## 2015-12-12 NOTE — Assessment & Plan Note (Signed)
bp in fair control at this time -on 2nd check  BP Readings from Last 1 Encounters:  12/10/15 138/70  ref made for routine cardiology f/u  No changes needed for now He will get a new cuff and start checking random bp readings  Disc lifstyle change with low sodium diet and exercise

## 2015-12-14 ENCOUNTER — Other Ambulatory Visit: Payer: Self-pay | Admitting: Family Medicine

## 2016-01-11 ENCOUNTER — Other Ambulatory Visit: Payer: Self-pay | Admitting: Family Medicine

## 2016-01-11 NOTE — Telephone Encounter (Signed)
Pt has had multiple acute appts but no recent/future f/u or CPE and no recent labs please advise

## 2016-01-11 NOTE — Telephone Encounter (Signed)
Info given to [place] to schedule appt and med refilled

## 2016-01-11 NOTE — Telephone Encounter (Signed)
Please schedule AMW and lab and then 30 min annual exam with me and refill until then thanks

## 2016-01-12 ENCOUNTER — Other Ambulatory Visit: Payer: Self-pay | Admitting: Family Medicine

## 2016-01-19 ENCOUNTER — Ambulatory Visit (INDEPENDENT_AMBULATORY_CARE_PROVIDER_SITE_OTHER): Payer: Medicare Other | Admitting: Cardiovascular Disease

## 2016-01-19 ENCOUNTER — Encounter: Payer: Self-pay | Admitting: Cardiovascular Disease

## 2016-01-19 VITALS — BP 158/90 | HR 55 | Ht 68.0 in | Wt 183.0 lb

## 2016-01-19 DIAGNOSIS — E785 Hyperlipidemia, unspecified: Secondary | ICD-10-CM

## 2016-01-19 DIAGNOSIS — I2510 Atherosclerotic heart disease of native coronary artery without angina pectoris: Secondary | ICD-10-CM | POA: Diagnosis not present

## 2016-01-19 DIAGNOSIS — I10 Essential (primary) hypertension: Secondary | ICD-10-CM

## 2016-01-19 NOTE — Assessment & Plan Note (Signed)
History of hypertension with blood pressures measured today at 158/90. He is on atenolol, Diovan hydrochlorothiazide. I've asked him to keep a blood pressure log over the next month after calibrating his home blood pressure cuff here in our office. He'll see Jerry Farrell back for review and antihypertensive medicine titration.

## 2016-01-19 NOTE — Assessment & Plan Note (Signed)
History of hyperlipidemia on statin therapy. Then a year since his last lipid profile which was excellent. This was performed 10/23/14 with total showing 22, LDL 62 and HDL 41. This is followed by his PCP

## 2016-01-19 NOTE — Patient Instructions (Signed)
Medication Instructions:  NO CHANGES.   Follow-Up: You have been referred to BP CLINIC WITH OUR PHARMACY FOR IN 1 MONTH.  Your physician has requested that you regularly monitor and record your blood pressure readings at home. Please use the same machine at the same time of day to check your readings and record them to bring to your follow-up visit. PLEASE BRING YOUR BP CUFF WITH YOU TO YOUR OFFICE VISIT.   Your physician wants you to follow-up in: [place]. You will receive a reminder letter in the mail two months in advance. If you don't receive a letter, please call our office to schedule the follow-up appointment.  If you need a refill on your cardiac medications before your next appointment, please call your pharmacy.

## 2016-01-19 NOTE — Assessment & Plan Note (Signed)
History of CAD status post bypass grafting 1 in 1994. He's done well since. He denies chest pain or shortness of breath.

## 2016-01-19 NOTE — Progress Notes (Signed)
01/19/2016 Jerry Farrell   03/28/1946  [id number]  Primary Physician Jerry Pardon, MD Primary Cardiologist: Jerry Harp MD Jerry Farrell  HPI:  Jerry Farrell is a very pleasant 70 year old thin-appearing married African-American male father of 77, grandfather and 5 grandchildren who is referred back to me by Dr. Glori Farrell, his PCP, to be reestablished in my practice. I last saw him 3 or 4 years ago but have been taking care of him since 1994. His risk factors include treated hypertension and hyperlipidemia. There is is a family history of a brother who recently had bypass surgery. He has never smoked. Retired from [employer]. He had coronary artery bypass grafting X 1  in 1994 for what sounds like left main disease. He denies chest pain or shortness of breath.   Current Outpatient Prescriptions  Medication Sig Dispense Refill  . aspirin 325 MG EC tablet Take 325 mg by mouth daily.    Marland Kitchen atenolol (TENORMIN) 50 MG tablet TAKE 1 TABLET BY MOUTH EVERY DAY 90 tablet 0  . atorvastatin (LIPITOR) 10 MG tablet TAKE 1 TABLET BY MOUTH EVERY DAY 90 tablet 0  . Bromfenac Sodium 0.09 % SOLN Use as directed    . dicyclomine (BENTYL) 10 MG capsule Take 1 capsule (10 mg total) by mouth 3 (three) times daily as needed for spasms. 90 capsule 11  . dorzolamide-timolol (COSOPT) 22.3-6.8 MG/ML ophthalmic solution Use as directed    . omeprazole (PRILOSEC) 40 MG capsule Take 1 capsule (40 mg total) by mouth daily. 30 capsule 11  . tadalafil (CIALIS) 20 MG tablet Take one by mouth up to every 3 days as needed before sexual activity.     . tamsulosin (FLOMAX) 0.4 MG CAPS capsule TAKE 1 CAPSULE (0.4 MG TOTAL) BY MOUTH DAILY. 90 capsule 1  . valsartan-hydrochlorothiazide (DIOVAN-HCT) 80-12.5 MG tablet TAKE 1 TABLET BY MOUTH EVERY DAY 90 tablet 0   No current facility-administered medications for this visit.     No Active Allergies  Social History   Social History  . Marital status: Married    Spouse  name: N/A  . Number of children: 3  . Years of education: N/A   Occupational History  . [employer] Retired   Social History Main Topics  . Smoking status: Never Smoker  . Smokeless tobacco: Never Used  . Alcohol use No  . Drug use: No  . Sexual activity: Not on file   Other Topics Concern  . Not on file   Social History Narrative  . No narrative on file     Review of Systems: General: negative for chills, fever, night sweats or weight changes.  Cardiovascular: negative for chest pain, dyspnea on exertion, edema, orthopnea, palpitations, paroxysmal nocturnal dyspnea or shortness of breath Dermatological: negative for rash Respiratory: negative for cough or wheezing Urologic: negative for hematuria Abdominal: negative for nausea, vomiting, diarrhea, bright red blood per rectum, melena, or hematemesis Neurologic: negative for visual changes, syncope, or dizziness All other systems reviewed and are otherwise negative except as noted above.    Blood pressure (!) 158/90, pulse (!) 55, height 5\' 8"  (1.727 m), weight 183 lb (83 kg).  General appearance: alert and no distress Neck: no adenopathy, no carotid bruit, no JVD, supple, symmetrical, trachea midline and thyroid not enlarged, symmetric, no tenderness/mass/nodules Lungs: clear to auscultation bilaterally Heart: regular rate and rhythm, S1, S2 normal, no murmur, click, rub or gallop Extremities: extremities normal, atraumatic, no cyanosis or edema  EKG sinus  bradycardia 55 with nonspecific ST and T-wave changes. I personally reviewed this EKG  ASSESSMENT AND PLAN:   Hyperlipidemia History of hyperlipidemia on statin therapy. Then a year since his last lipid profile which was excellent. This was performed 10/23/14 with total showing 22, LDL 62 and HDL 41. This is followed by his PCP  Essential hypertension History of hypertension with blood pressures measured today at 158/90. He is on atenolol, Diovan hydrochlorothiazide.  I've asked him to keep a blood pressure log over the next month after calibrating his home blood pressure cuff here in our office. He'll see Jerry Farrell back for review and antihypertensive medicine titration.  Coronary atherosclerosis History of CAD status post bypass grafting 1 in 1994. He's done well since. He denies chest pain or shortness of breath.      Jerry Harp MD FACP,FACC,FAHA, [facility] 01/19/2016 2:57 PM

## 2016-01-21 DIAGNOSIS — H16223 Keratoconjunctivitis sicca, not specified as Sjogren's, bilateral: Secondary | ICD-10-CM | POA: Diagnosis not present

## 2016-01-21 DIAGNOSIS — H25041 Posterior subcapsular polar age-related cataract, right eye: Secondary | ICD-10-CM | POA: Diagnosis not present

## 2016-01-21 DIAGNOSIS — H25011 Cortical age-related cataract, right eye: Secondary | ICD-10-CM | POA: Diagnosis not present

## 2016-01-21 DIAGNOSIS — H35351 Cystoid macular degeneration, right eye: Secondary | ICD-10-CM | POA: Diagnosis not present

## 2016-01-25 ENCOUNTER — Telehealth: Payer: Self-pay | Admitting: Family Medicine

## 2016-01-25 DIAGNOSIS — Z1159 Encounter for screening for other viral diseases: Secondary | ICD-10-CM | POA: Insufficient documentation

## 2016-01-25 DIAGNOSIS — Z125 Encounter for screening for malignant neoplasm of prostate: Secondary | ICD-10-CM

## 2016-01-25 DIAGNOSIS — Z0000 Encounter for general adult medical examination without abnormal findings: Secondary | ICD-10-CM

## 2016-01-25 NOTE — Telephone Encounter (Signed)
-----   Message from Eustace Pen, LPN sent at 075-GRM 11:35 AM EDT ----- Regarding: Lab Orders for 01/26/16 Please add Hep C to lab orders for 01/26/16.  Thank you :)

## 2016-01-26 ENCOUNTER — Ambulatory Visit (INDEPENDENT_AMBULATORY_CARE_PROVIDER_SITE_OTHER): Payer: Medicare Other

## 2016-01-26 VITALS — BP 148/70 | HR 53 | Temp 97.6°F | Ht 67.5 in | Wt 183.2 lb

## 2016-01-26 DIAGNOSIS — Z125 Encounter for screening for malignant neoplasm of prostate: Secondary | ICD-10-CM | POA: Diagnosis not present

## 2016-01-26 DIAGNOSIS — Z0000 Encounter for general adult medical examination without abnormal findings: Secondary | ICD-10-CM | POA: Diagnosis not present

## 2016-01-26 DIAGNOSIS — Z1159 Encounter for screening for other viral diseases: Secondary | ICD-10-CM

## 2016-01-26 DIAGNOSIS — Z23 Encounter for immunization: Secondary | ICD-10-CM

## 2016-01-26 LAB — COMPREHENSIVE METABOLIC PANEL
ALT: 20 U/L (ref 0–53)
AST: 18 U/L (ref 0–37)
Albumin: 3.9 g/dL (ref 3.5–5.2)
Alkaline Phosphatase: 106 U/L (ref 39–117)
BUN: 17 mg/dL (ref 6–23)
CO2: 33 mEq/L — ABNORMAL HIGH (ref 19–32)
Calcium: 8.9 mg/dL (ref 8.4–10.5)
Chloride: 103 mEq/L (ref 96–112)
Creatinine, Ser: 1.05 mg/dL (ref 0.40–1.50)
GFR: 89.15 mL/min (ref >60.00)
Glucose, Bld: 103 mg/dL — ABNORMAL HIGH (ref 70–99)
Potassium: 4.2 mEq/L (ref 3.5–5.1)
Sodium: 142 mEq/L (ref 135–145)
Total Bilirubin: 0.9 mg/dL (ref 0.2–1.2)
Total Protein: 6.8 g/dL (ref 6.0–8.3)

## 2016-01-26 LAB — LIPID PANEL
CHOLESTEROL: 156 mg/dL (ref 0–200)
HDL: 51.1 mg/dL (ref >39.00)
LDL CALC: 67 mg/dL (ref 0–99)
NONHDL: 105.3
Total CHOL/HDL Ratio: 3
Triglycerides: 191 mg/dL — ABNORMAL HIGH (ref 0.0–149.0)
VLDL: 38.2 mg/dL (ref 0.0–40.0)

## 2016-01-26 LAB — CBC WITH DIFFERENTIAL/PLATELET
BASOS ABS: 0 10*3/uL (ref 0.0–0.1)
Basophils Relative: 0.5 % (ref 0.0–3.0)
EOS ABS: 0.1 10*3/uL (ref 0.0–0.7)
EOS PCT: 1 % (ref 0.0–5.0)
HCT: 47 % (ref 39.0–52.0)
HEMOGLOBIN: 15.9 g/dL (ref 13.0–17.0)
Lymphocytes Relative: 19.6 % (ref 12.0–46.0)
Lymphs Abs: 1.7 10*3/uL (ref 0.7–4.0)
MCHC: 33.9 g/dL (ref 30.0–36.0)
MCV: 87.3 fl (ref 78.0–100.0)
MONO ABS: 0.6 10*3/uL (ref 0.1–1.0)
Monocytes Relative: 7 % (ref 3.0–12.0)
NEUTROS PCT: 71.9 % (ref 43.0–77.0)
Neutro Abs: 6.2 10*3/uL (ref 1.4–7.7)
Platelets: 140 10*3/uL — ABNORMAL LOW (ref 150.0–400.0)
RBC: 5.38 Mil/uL (ref 4.22–5.81)
RDW: 13.8 % (ref 11.5–15.5)
WBC: 8.7 10*3/uL (ref 4.0–10.5)

## 2016-01-26 LAB — TSH: TSH: 1.16 u[IU]/mL (ref 0.35–4.50)

## 2016-01-26 LAB — PSA, MEDICARE: PSA: 1.61 ng/mL (ref 0.10–4.00)

## 2016-01-26 NOTE — Patient Instructions (Signed)
Jerry Farrell , Thank you for taking time to come for your Medicare Wellness Visit. I appreciate your ongoing commitment to your health goals. Please review the following plan we discussed and let me know if I can assist you in the future.   These are the goals we discussed: Goals    . Increase physical activity          Starting 01/26/2016, I will continue to walk at least 60 min 4 days per week and bowl for at least 2-2.5 hours one day per week.        This is a list of the screening recommended for you and due dates:  Health Maintenance  Topic Date Due  . Colon Cancer Screening  06/23/2017  . Tetanus Vaccine  11/25/2017  . Flu Shot  Completed  . Shingles Vaccine  Completed  .  Hepatitis C: One time screening is recommended by Center for Disease Control  (CDC) for  adults born from [birth year] through 1967.   Completed  . Pneumonia vaccines  Completed   Preventive Care for Adults  A healthy lifestyle and preventive care can promote health and wellness. Preventive health guidelines for adults include the following key practices.  . A routine yearly physical is a good way to check with your health care provider about your health and preventive screening. It is a chance to share any concerns and updates on your health and to receive a thorough exam.  . Visit your dentist for a routine exam and preventive care every 6 months. Brush your teeth twice a day and floss once a day. Good oral hygiene prevents tooth decay and gum disease.  . The frequency of eye exams is based on your age, health, family medical history, use  of contact lenses, and other factors. Follow your health care provider's ecommendations for frequency of eye exams.  . Eat a healthy diet. Foods like vegetables, fruits, whole grains, low-fat dairy products, and lean protein foods contain the nutrients you need without too many calories. Decrease your intake of foods high in solid fats, added sugars, and salt. Eat the right amount  of calories for you. Get information about a proper diet from your health care provider, if necessary.  . Regular physical exercise is one of the most important things you can do for your health. Most adults should get at least 150 minutes of moderate-intensity exercise (any activity that increases your heart rate and causes you to sweat) each week. In addition, most adults need muscle-strengthening exercises on 2 or more days a week.  Silver Sneakers may be a benefit available to you. To determine eligibility, you may visit the website: www.silversneakers.com or contact program at 704-173-3248 Mon-Fri between 8AM-8PM.   . Maintain a healthy weight. The body mass index (BMI) is a screening tool to identify possible weight problems. It provides an estimate of body fat based on height and weight. Your health care provider can find your BMI and can help you achieve or maintain a healthy weight.   For adults 20 years and older: ? A BMI below 18.5 is considered underweight. ? A BMI of 18.5 to 24.9 is normal. ? A BMI of 25 to 29.9 is considered overweight. ? A BMI of 30 and above is considered obese.   . Maintain normal blood lipids and cholesterol levels by exercising and minimizing your intake of saturated fat. Eat a balanced diet with plenty of fruit and vegetables. Blood tests for lipids and cholesterol should begin  at age 22 and be repeated every 5 years. If your lipid or cholesterol levels are high, you are over 50, or you are at high risk for heart disease, you may need your cholesterol levels checked more frequently. Ongoing high lipid and cholesterol levels should be treated with medicines if diet and exercise are not working.  . If you smoke, find out from your health care provider how to quit. If you do not use tobacco, please do not start.  . If you choose to drink alcohol, please do not consume more than 2 drinks per day. One drink is considered to be 12 ounces (355 mL) of beer, 5 ounces  (148 mL) of wine, or 1.5 ounces (44 mL) of liquor.  . If you are 37-16 years old, ask your health care provider if you should take aspirin to prevent strokes.  . Use sunscreen. Apply sunscreen liberally and repeatedly throughout the day. You should seek shade when your shadow is shorter than you. Protect yourself by wearing long sleeves, pants, a wide-brimmed hat, and sunglasses year round, whenever you are outdoors.  . Once a month, do a whole body skin exam, using a mirror to look at the skin on your back. Tell your health care provider of new moles, moles that have irregular borders, moles that are larger than a pencil eraser, or moles that have changed in shape or color.

## 2016-01-27 LAB — HEPATITIS C ANTIBODY: HCV AB: NEGATIVE

## 2016-01-31 ENCOUNTER — Telehealth: Payer: Self-pay

## 2016-01-31 ENCOUNTER — Encounter: Payer: Self-pay | Admitting: Family Medicine

## 2016-01-31 ENCOUNTER — Ambulatory Visit (INDEPENDENT_AMBULATORY_CARE_PROVIDER_SITE_OTHER): Payer: Medicare Other | Admitting: Family Medicine

## 2016-01-31 VITALS — BP 126/78 | HR 72 | Temp 99.3°F | Ht 67.5 in | Wt 180.8 lb

## 2016-01-31 DIAGNOSIS — E7800 Pure hypercholesterolemia, unspecified: Secondary | ICD-10-CM

## 2016-01-31 DIAGNOSIS — Z0000 Encounter for general adult medical examination without abnormal findings: Secondary | ICD-10-CM

## 2016-01-31 DIAGNOSIS — I10 Essential (primary) hypertension: Secondary | ICD-10-CM | POA: Diagnosis not present

## 2016-01-31 DIAGNOSIS — Z125 Encounter for screening for malignant neoplasm of prostate: Secondary | ICD-10-CM | POA: Diagnosis not present

## 2016-01-31 DIAGNOSIS — R739 Hyperglycemia, unspecified: Secondary | ICD-10-CM

## 2016-01-31 DIAGNOSIS — N400 Enlarged prostate without lower urinary tract symptoms: Secondary | ICD-10-CM

## 2016-01-31 DIAGNOSIS — R7303 Prediabetes: Secondary | ICD-10-CM | POA: Insufficient documentation

## 2016-01-31 MED ORDER — METOPROLOL SUCCINATE ER 50 MG PO TB24: 50.0000 mg | ORAL_TABLET | Freq: Every day | ORAL | 3 refills | Status: DC

## 2016-01-31 NOTE — Assessment & Plan Note (Signed)
Disc goals for lipids and reasons to control them (esp with dx of CAD) Rev labs with pt Rev low sat fat diet in detail Disc limiting carbs for elevated triglycerides  Continue atorvastatin and diet

## 2016-01-31 NOTE — Progress Notes (Signed)
Subjective:    Patient ID: Jerry Farrell, male    DOB: 1945/06/02, 70 y.o.   MRN: [id number]  HPI  Here for health maintenance exam and to review chronic medical problems    Doing well overall  Taking care of grand kids while kids are out of town    He saw [place] for his AMW visit  Did well/ wears hearing aides and utd on vision exam  Colonoscopy 3/09-nl with 10 year recall (after several colonoscopies that were normal)  Brother had colon cancer , another brother with  throat cancer (? etoh related) Sister had unknown type of cancer  Father died of ? Causes   Complete on vaccines and hep C screening (neg)   Prostate health  Lab Results  Component Value Date   PSA 1.61 01/26/2016   PSA 1.21 10/23/2014   PSA 1.41 08/12/2013   not a lot of change  Only has nocturia if he drinks sprite at night  0-1 episodes     Wt Readings from Last 3 Encounters:  01/31/16 180 lb 12 oz (82 kg)  01/26/16 183 lb 4 oz (83.1 kg)  01/19/16 183 lb (83 kg)  trying to take care of himself  Usually he walks 3 miles every day and bowls once per week  Diet is fair- it is hard for him to make healthy choices= folks who cook for him do not watch their diets  bmi is 27.8  bp is stable today  No cp or palpitations or headaches or edema  No side effects to medicines  BP Readings from Last 3 Encounters:  01/31/16 126/78  01/26/16 (!) 148/70  01/19/16 (!) 158/90  his atenolol is on back order       Hx of hyperlipidemia in setting of CAD- had visit with cardiologist recently - bp was up then  It is improved today  He is checking bp at home and will f/u with them His top number at home ranges 140s usually  Lab Results  Component Value Date   CHOL 156 01/26/2016   CHOL 122 10/23/2014   CHOL 138 08/12/2013   Lab Results  Component Value Date   HDL 51.10 01/26/2016   HDL 41.60 10/23/2014   HDL 49.40 08/12/2013   Lab Results  Component Value Date   LDLCALC 67 01/26/2016   LDLCALC 62  10/23/2014   LDLCALC 67 08/12/2013   Lab Results  Component Value Date   TRIG 191.0 (H) 01/26/2016   TRIG 93.0 10/23/2014   TRIG 108.0 08/12/2013   Lab Results  Component Value Date   CHOLHDL 3 01/26/2016   CHOLHDL 3 10/23/2014   CHOLHDL 3 08/12/2013   No results found for: LDLDIRECT  On lipitor and diet  Trig are up -otherwise stable Eating more carbs and some candy   Of note -glucose slt elevated at 103 fasting as well  Disc prev of DM  Results for orders placed or performed in visit on 01/26/16  PSA, Medicare  Result Value Ref Range   PSA 1.61 0.10 - 4.00 ng/ml  CBC with Differential/Platelet  Result Value Ref Range   WBC 8.7 4.0 - 10.5 K/uL   RBC 5.38 4.22 - 5.81 Mil/uL   Hemoglobin 15.9 13.0 - 17.0 g/dL   HCT 47.0 39.0 - 52.0 %   MCV 87.3 78.0 - 100.0 fl   MCHC 33.9 30.0 - 36.0 g/dL   RDW 13.8 11.5 - 15.5 %   Platelets 140.0 (L) 150.0 - 400.0  K/uL   Neutrophils Relative % 71.9 43.0 - 77.0 %   Lymphocytes Relative 19.6 12.0 - 46.0 %   Monocytes Relative 7.0 3.0 - 12.0 %   Eosinophils Relative 1.0 0.0 - 5.0 %   Basophils Relative 0.5 0.0 - 3.0 %   Neutro Abs 6.2 1.4 - 7.7 K/uL   Lymphs Abs 1.7 0.7 - 4.0 K/uL   Monocytes Absolute 0.6 0.1 - 1.0 K/uL   Eosinophils Absolute 0.1 0.0 - 0.7 K/uL   Basophils Absolute 0.0 0.0 - 0.1 K/uL  Comprehensive metabolic panel  Result Value Ref Range   Sodium 142 135 - 145 mEq/L   Potassium 4.2 3.5 - 5.1 mEq/L   Chloride 103 96 - 112 mEq/L   CO2 33 (H) 19 - 32 mEq/L   Glucose, Bld 103 (H) 70 - 99 mg/dL   BUN 17 6 - 23 mg/dL   Creatinine, Ser 1.05 0.40 - 1.50 mg/dL   Total Bilirubin 0.9 0.2 - 1.2 mg/dL   Alkaline Phosphatase 106 39 - 117 U/L   AST 18 0 - 37 U/L   ALT 20 0 - 53 U/L   Total Protein 6.8 6.0 - 8.3 g/dL   Albumin 3.9 3.5 - 5.2 g/dL   Calcium 8.9 8.4 - 10.5 mg/dL   GFR 89.15 >60.00 mL/min  Lipid panel  Result Value Ref Range   Cholesterol 156 0 - 200 mg/dL   Triglycerides 191.0 (H) 0.0 - 149.0 mg/dL   HDL  51.10 >39.00 mg/dL   VLDL 38.2 0.0 - 40.0 mg/dL   LDL Cholesterol 67 0 - 99 mg/dL   Total CHOL/HDL Ratio 3    NonHDL 105.30   TSH  Result Value Ref Range   TSH 1.16 0.35 - 4.50 uIU/mL  Hepatitis C antibody  Result Value Ref Range   HCV Ab NEGATIVE NEGATIVE    Patient Active Problem List   Diagnosis Date Noted  . Hyperglycemia 01/31/2016  . Need for hepatitis C screening test 01/25/2016  . Post-nasal drip 12/10/2015  . GERD (gastroesophageal reflux disease) 07/20/2015  . Abdominal cramps 07/20/2015  . Routine general medical examination at a health care facility 10/28/2014  . Elevated alkaline phosphatase level 10/28/2014  . Urinary frequency 04/29/2014  . BPH (benign prostatic hyperplasia) 04/29/2014  . Prostate cancer screening 08/11/2013  . Encounter for Medicare annual wellness exam 08/11/2013  . TRANSAMINASES, SERUM, ELEVATED 04/05/2010  . Hyperlipidemia 01/09/2007  . ERECTILE DYSFUNCTION 01/09/2007  . Essential hypertension 01/09/2007  . Coronary atherosclerosis 01/09/2007  . ESOPHAGITIS 01/09/2007   Past Medical History:  Diagnosis Date  . CAD (coronary artery disease)    cardiolite ok 3/05, stress test- low risk study 11/06  . Chronic low back pain    with radiculopathy  . ED (erectile dysfunction)   . Gallstones    abd [place]- stable hamangioma, gallbladder sludge and tiny stones 09/2003  . GERD (gastroesophageal reflux disease)   . Hyperlipidemia   . Hypertension   . Transfusion history    Past Surgical History:  Procedure Laterality Date  . CATARACT EXTRACTION W/PHACO  03/13/2012   Procedure: CATARACT EXTRACTION PHACO AND INTRAOCULAR LENS PLACEMENT (IOC);  Surgeon: Adonis Brook, MD;  Location: [facility];  Service: Ophthalmology;  Laterality: Left;  . CORONARY ARTERY BYPASS GRAFT  1998   x 1  done here at [facility]  . GASTRECTOMY     partial  . MEMBRANE PEEL  03/13/2012   Procedure: MEMBRANE PEEL;  Surgeon: Adonis Brook, MD;  Location: [facility] [facility];  Service: Ophthalmology;   Laterality: Left;  . PARS PLANA VITRECTOMY  03/13/2012   Procedure: PARS PLANA VITRECTOMY WITH 23 GAUGE;  Surgeon: Adonis Brook, MD;  Location: [facility];  Service: Ophthalmology;  Laterality: Left;   Social History  Substance Use Topics  . Smoking status: Never Smoker  . Smokeless tobacco: Never Used  . Alcohol use No   Family History  Problem Relation Age of Onset  . Cancer Sister     unknown type, pt feels alcohol related  . Cancer Brother     colon  . Cancer Brother     throat, pt feels alcohol related  . Hypertension Mother    No Active Allergies Current Outpatient Prescriptions on File Prior to Visit  Medication Sig Dispense Refill  . aspirin 325 MG EC tablet Take 325 mg by mouth daily.    Marland Kitchen atorvastatin (LIPITOR) 10 MG tablet TAKE 1 TABLET BY MOUTH EVERY DAY 90 tablet 0  . brimonidine (ALPHAGAN) 0.2 % ophthalmic solution INSTILL 1 DROP IN LEFT EYE TWICE DAILY  0  . dicyclomine (BENTYL) 10 MG capsule Take 1 capsule (10 mg total) by mouth 3 (three) times daily as needed for spasms. 90 capsule 11  . dorzolamide-timolol (COSOPT) 22.3-6.8 MG/ML ophthalmic solution Use as directed    . loratadine (CLARITIN) 10 MG tablet Take 10 mg by mouth daily.    Marland Kitchen omeprazole (PRILOSEC) 40 MG capsule Take 1 capsule (40 mg total) by mouth daily. 30 capsule 11  . tadalafil (CIALIS) 20 MG tablet Take one by mouth up to every 3 days as needed before sexual activity.     . tamsulosin (FLOMAX) 0.4 MG CAPS capsule TAKE 1 CAPSULE (0.4 MG TOTAL) BY MOUTH DAILY. 90 capsule 1  . valsartan-hydrochlorothiazide (DIOVAN-HCT) 80-12.5 MG tablet TAKE 1 TABLET BY MOUTH EVERY DAY 90 tablet 0   No current facility-administered medications on file prior to visit.     Review of Systems Review of Systems  Constitutional: Negative for fever, appetite change, fatigue and unexpected weight change.  Eyes: Negative for pain and visual disturbance.  Respiratory: Negative for cough and shortness of breath.     Cardiovascular: Negative for cp or palpitations    Gastrointestinal: Negative for nausea, diarrhea and constipation.  Genitourinary: Negative for urgency and frequency.  Skin: Negative for pallor or rash   Neurological: Negative for weakness, light-headedness, numbness and headaches.  Hematological: Negative for adenopathy. Does not bruise/bleed easily.  Psychiatric/Behavioral: Negative for dysphoric mood. The patient is not nervous/anxious.         Objective:   Physical Exam  Constitutional: He is oriented to person, place, and time. He appears well-developed and well-nourished. No distress.  Well appearing   HENT:  Head: Normocephalic and atraumatic.  Right Ear: External ear normal.  Left Ear: External ear normal.  Nose: Nose normal.  Mouth/Throat: Oropharynx is clear and moist.  Hard of hearing-not wearing hearing aides At times difficult to communicate with   Eyes: Conjunctivae and EOM are normal. Pupils are equal, round, and reactive to light. Right eye exhibits no discharge. Left eye exhibits no discharge. No scleral icterus.  Neck: Normal range of motion. Neck supple. No JVD present. Carotid bruit is not present. No thyromegaly present.  Cardiovascular: Normal rate, regular rhythm, normal heart sounds and intact distal pulses.  Exam reveals no gallop.   Pulmonary/Chest: Effort normal and breath sounds normal. No respiratory distress. He has no wheezes. He has no rales.  Abdominal: Soft. Bowel sounds are normal. He  exhibits no distension, no abdominal bruit and no mass. There is no tenderness.  Genitourinary: Rectal exam shows no mass, no tenderness and anal tone normal. Prostate is enlarged. Prostate is not tender.  Genitourinary Comments: Mild prostate enlargement  Non tender, firm, symmetric and no nodules noted   Musculoskeletal: Normal range of motion. He exhibits no edema or tenderness.  Lymphadenopathy:    He has no cervical adenopathy.  Neurological: He is alert and  oriented to person, place, and time. He has normal reflexes. No cranial nerve deficit. He exhibits normal muscle tone. Coordination normal.  Skin: Skin is warm and dry. No rash noted. No erythema. No pallor.  Some lentigines and skin tags   Psychiatric: He has a normal mood and affect.          Assessment & Plan:   Problem List Items Addressed This Visit      Cardiovascular and Mediastinum   Essential hypertension    bp in fair control at this time  BP Readings from Last 1 Encounters:  01/31/16 126/78  since atenolol will be on back order-will change to metoprolol ER 50 mg  Disc poss side eff incl low pulse /hypotension-will watch closely and update  Disc lifstyle change with low sodium diet and exercise        Relevant Medications   metoprolol succinate (TOPROL-XL) 50 MG 24 hr tablet     Genitourinary   BPH (benign prostatic hyperplasia)    DRE and PSA stable today  On flomax  No change in symptoms         Other   Hyperglycemia    Fasting glucose of 103 and elevated triglycerides Disc wt control and low glycemic diet to try and prevent DM2  Will check A1C next time Enc continued exercise       Hyperlipidemia    Disc goals for lipids and reasons to control them (esp with dx of CAD) Rev labs with pt Rev low sat fat diet in detail Disc limiting carbs for elevated triglycerides  Continue atorvastatin and diet       Relevant Medications   metoprolol succinate (TOPROL-XL) 50 MG 24 hr tablet   Routine general medical examination at a health care facility - Primary    Reviewed health habits including diet and exercise and skin cancer prevention Reviewed appropriate screening tests for age  Also reviewed health mt list, fam hx and immunization status , as well as social and family history   See HPI  Reviewed AMW Reviewed labs utd imms  DRE is stable as is psa  Will contact GI regarding callback for colonoscopy in light of family hx  Will watch glucose and  cholesterol carefully  Enc to keep exercising        Other Visit Diagnoses    Screening for prostate cancer

## 2016-01-31 NOTE — Assessment & Plan Note (Signed)
Reviewed health habits including diet and exercise and skin cancer prevention Reviewed appropriate screening tests for age  Also reviewed health mt list, fam hx and immunization status , as well as social and family history   See HPI  Reviewed AMW Reviewed labs utd imms  DRE is stable as is psa  Will contact GI regarding callback for colonoscopy in light of family hx  Will watch glucose and cholesterol carefully  Enc to keep exercising

## 2016-01-31 NOTE — Telephone Encounter (Signed)
-----   Message from Ladene Artist, MD sent at 01/31/2016  8:51 AM EDT ----- Alethia Berthold,  With the update in his family history a 5 year interval colonoscopy would be reasonable. We will contact him to discuss with him.   Thanks,   Norberto Sorenson    ----- Message ----- From: Abner Greenspan, MD Sent: 01/31/2016   8:47 AM To: Ladene Artist, MD  Hello and happy Monday !   I had a question about Mr Easter- he had his last colonoscopy in 09 and it looks like there was a 10 year recall  His chart now states that his brother had colon cancer, do you want to do a colonoscopy earlier ?(he had several in a row w/o polyps so I was unsure) Thanks! --Roque Lias T

## 2016-01-31 NOTE — Progress Notes (Signed)
Pre visit review using our clinic review tool, if applicable. No additional management support is needed unless otherwise documented below in the visit note. 

## 2016-01-31 NOTE — Assessment & Plan Note (Signed)
DRE and PSA stable today  On flomax  No change in symptoms

## 2016-01-31 NOTE — Assessment & Plan Note (Signed)
bp in fair control at this time  BP Readings from Last 1 Encounters:  01/31/16 126/78  since atenolol will be on back order-will change to metoprolol ER 50 mg  Disc poss side eff incl low pulse /hypotension-will watch closely and update  Disc lifstyle change with low sodium diet and exercise

## 2016-01-31 NOTE — Telephone Encounter (Signed)
Patient confirmed that his brother did have colon cancer.  He is advised that we should set him up for a colonoscopy now based on this information.  He asks that I call him back tomorrow.  He was in a rush to pick up his wife.

## 2016-01-31 NOTE — Patient Instructions (Addendum)
Triglycerides are up a little , also glucose  This can come from carbohydrates and sweets and sugar Try to get rid of sweet drinks and only eat sweets occasionally  Cut back on servings of pasta/bread/rice/potato and other starches   Instead of atenolol take metoprolol ER 50 mg daily- let us know if you have any problems with this and keep an eye on your blood pressure - if it is too low or your dizzy call us / if pulse stays below 50 also call   Follow up with Dr Gwenlyn Found as planned   Make an effort to wear your hearing aides more often   Keep exercising!

## 2016-01-31 NOTE — Assessment & Plan Note (Signed)
Fasting glucose of 103 and elevated triglycerides Disc wt control and low glycemic diet to try and prevent DM2  Will check A1C next time Enc continued exercise

## 2016-02-01 NOTE — Progress Notes (Signed)
Pre visit review using our clinic review tool, if applicable. No additional management support is needed unless otherwise documented below in the visit note. 

## 2016-02-01 NOTE — Telephone Encounter (Signed)
Called again.  Machine is off.  I will call back tomorrow.

## 2016-02-01 NOTE — Progress Notes (Signed)
I reviewed health advisor's note, was available for consultation, and agree with documentation and plan.  

## 2016-02-01 NOTE — Progress Notes (Signed)
PCP notes:   Health maintenance:  Flu vaccine - administered Hep C screening - completed  Abnormal screenings:   None  Patient concerns:   None  Nurse concerns:  None  Next PCP appt:   01/31/16 @ 0800

## 2016-02-01 NOTE — Progress Notes (Signed)
Subjective:   Jerry Farrell is a 70 y.o. male who presents for Medicare Annual/Subsequent preventive examination.  Review of Systems:  N/A Cardiac Risk Factors include: advanced age (>48men, >49 women);dyslipidemia;hypertension;male gender     Objective:    Vitals: BP (!) 148/70 (BP Location: Right Arm, Patient Position: Sitting, Cuff Size: Normal)   Pulse (!) 53   Temp 97.6 F (36.4 C) (Oral)   Ht 5' 7.5" (1.715 m) Comment: no shoes  Wt 183 lb 4 oz (83.1 kg)   SpO2 98%   BMI 28.28 kg/m   Body mass index is 28.28 kg/m.  Tobacco History  Smoking Status  . Never Smoker  Smokeless Tobacco  . Never Used     Counseling given: No   Past Medical History:  Diagnosis Date  . CAD (coronary artery disease)    cardiolite ok 3/05, stress test- low risk study 11/06  . Chronic low back pain    with radiculopathy  . ED (erectile dysfunction)   . Gallstones    abd [place]- stable hamangioma, gallbladder sludge and tiny stones 09/2003  . GERD (gastroesophageal reflux disease)   . Hyperlipidemia   . Hypertension   . Transfusion history    Past Surgical History:  Procedure Laterality Date  . CATARACT EXTRACTION W/PHACO  03/13/2012   Procedure: CATARACT EXTRACTION PHACO AND INTRAOCULAR LENS PLACEMENT (IOC);  Surgeon: Adonis Brook, MD;  Location: [facility];  Service: Ophthalmology;  Laterality: Left;  . CORONARY ARTERY BYPASS GRAFT  1998   x 1  done here [facility]  . GASTRECTOMY     partial  . MEMBRANE PEEL  03/13/2012   Procedure: MEMBRANE PEEL;  Surgeon: Adonis Brook, MD;  Location: [facility];  Service: Ophthalmology;  Laterality: Left;  . PARS PLANA VITRECTOMY  03/13/2012   Procedure: PARS PLANA VITRECTOMY WITH 23 GAUGE;  Surgeon: Adonis Brook, MD;  Location: [facility];  Service: Ophthalmology;  Laterality: Left;   Family History  Problem Relation Age of Onset  . Cancer Sister     unknown type, pt feels alcohol related  . Cancer Brother     colon  . Cancer Brother     throat, pt feels  alcohol related  . Hypertension Mother    History  Sexual Activity  . Sexual activity: Yes    Outpatient Encounter Prescriptions as of 01/26/2016  Medication Sig  . aspirin 325 MG EC tablet Take 325 mg by mouth daily.  Marland Kitchen atorvastatin (LIPITOR) 10 MG tablet TAKE 1 TABLET BY MOUTH EVERY DAY  . brimonidine (ALPHAGAN) 0.2 % ophthalmic solution INSTILL 1 DROP IN LEFT EYE TWICE DAILY  . dicyclomine (BENTYL) 10 MG capsule Take 1 capsule (10 mg total) by mouth 3 (three) times daily as needed for spasms.  . dorzolamide-timolol (COSOPT) 22.3-6.8 MG/ML ophthalmic solution Use as directed  . loratadine (CLARITIN) 10 MG tablet Take 10 mg by mouth daily.  Marland Kitchen omeprazole (PRILOSEC) 40 MG capsule Take 1 capsule (40 mg total) by mouth daily.  . tadalafil (CIALIS) 20 MG tablet Take one by mouth up to every 3 days as needed before sexual activity.   . tamsulosin (FLOMAX) 0.4 MG CAPS capsule TAKE 1 CAPSULE (0.4 MG TOTAL) BY MOUTH DAILY.  . valsartan-hydrochlorothiazide (DIOVAN-HCT) 80-12.5 MG tablet TAKE 1 TABLET BY MOUTH EVERY DAY  . [DISCONTINUED] atenolol (TENORMIN) 50 MG tablet TAKE 1 TABLET BY MOUTH EVERY DAY  . [DISCONTINUED] Bromfenac Sodium 0.09 % SOLN Use as directed   No facility-administered encounter medications on file  as of 01/26/2016.     Activities of Daily Living In your present state of health, do you have any difficulty performing the following activities: 01/26/2016  Hearing? Y  Vision? N  Difficulty concentrating or making decisions? N  Walking or climbing stairs? N  Dressing or bathing? N  Doing errands, shopping? N  Preparing Food and eating ? N  Using the Toilet? N  In the past six months, have you accidently leaked urine? N  Do you have problems with loss of bowel control? N  Managing your Medications? N  Managing your Finances? N  Housekeeping or managing your Housekeeping? N  Some recent data might be hidden    Patient Care Team: Abner Greenspan, MD as PCP -  General Lorretta Harp, MD as Consulting Physician (Cardiology) Camillo Flaming, OD as Consulting Physician (Optometry)   Assessment:    Hearing Screening Comments: Pt has been prescribed hearing aids but does not wear them consistently Vision Screening Comments: Last vision exam in Sept 2017 with Dr. Juliane Poot  Exercise Activities and Dietary recommendations Current Exercise Habits: Home exercise routine, Type of exercise: walking;Other - see comments (bowling 2-2.25 hours one day/walking 4 days per week ), Time (Minutes): 60, Frequency (Times/Week): 5, Weekly Exercise (Minutes/Week): 300, Intensity: Moderate, Exercise limited by: None identified  Goals    . Increase physical activity          Starting 01/26/2016, I will continue to walk at least 60 min 4 days per week and bowl for at least 2-2.5 hours one day per week.       Fall Risk Fall Risk  01/26/2016 10/28/2014 08/19/2013 04/30/2012  Falls in the past year? No No No No   Depression Screen PHQ 2/9 Scores 01/26/2016 10/28/2014 08/19/2013 04/30/2012  PHQ - 2 Score 0 0 0 0    Cognitive Testing MMSE - Mini Mental State Exam 01/26/2016  Orientation to time 5  Orientation to Place 5  Registration 3  Attention/ Calculation 0  Recall 3  Language- name 2 objects 0  Language- repeat 1  Language- follow 3 step command 3  Language- read & follow direction 0  Write a sentence 0  Copy design 0  Total score 20   PLEASE NOTE: A Mini-Cog screen was completed. Maximum score is 20. A value of 0 denotes this part of Folstein MMSE was not completed or the patient failed this part of the Mini-Cog screening.   Mini-Cog Screening Orientation to Time - Max 5 pts Orientation to Place - Max 5 pts Registration - Max 3 pts Recall - Max 3 pts Language Repeat - Max 1 pts Language Follow 3 Step Command - Max 3 pts   Immunization History  Administered Date(s) Administered  . Influenza Split 03/17/2011, 04/30/2012  . Influenza,inj,Quad PF,36+ Mos  04/08/2013, 05/28/2015, 01/26/2016  . Influenza-Unspecified 02/22/2014  . Pneumococcal Conjugate-13 10/28/2014  . Pneumococcal Polysaccharide-23 04/30/2012  . Td 08/22/1997, 11/26/2007  . Zoster 08/19/2013   Screening Tests Health Maintenance  Topic Date Due  . COLONOSCOPY  06/23/2017  . TETANUS/TDAP  11/25/2017  . INFLUENZA VACCINE  Completed  . ZOSTAVAX  Completed  . Hepatitis C Screening  Completed  . PNA vac Low Risk Adult  Completed      Plan:     I have personally reviewed and addressed the Medicare Annual Wellness questionnaire and have noted the following in the patient's chart:  A. Medical and social history B. Use of alcohol, tobacco or illicit drugs  C.  Current medications and supplements D. Functional ability and status E.  Nutritional status F.  Physical activity G. Advance directives H. List of other physicians I.  Hospitalizations, surgeries, and ER visits in previous 12 months J.  [place] to include hearing, vision, cognitive, depression L. Referrals and appointments - none  In addition, I have reviewed and discussed with patient certain preventive protocols, quality metrics, and best practice recommendations. A written personalized care plan for preventive services as well as general preventive health recommendations were provided to patient.  See attached scanned questionnaire for additional information.   Signed,   Lindell Noe, MHA, BS, LPN Health Advisor

## 2016-02-02 NOTE — Telephone Encounter (Signed)
Helped patient schedule colon and pre-visit.

## 2016-02-06 ENCOUNTER — Other Ambulatory Visit: Payer: Self-pay | Admitting: Family Medicine

## 2016-02-07 NOTE — Telephone Encounter (Signed)
Since a few of his bp readings before his visit with me were up - I'd like him to try the metoprolol to see if it works better  If not (or if any problems)- we can go back

## 2016-02-07 NOTE — Telephone Encounter (Signed)
Pt notified of Dr. Marliss Coots comments and verbalized understanding, Rx for atenolol was declined and I left pharmacy a voicemail to make sure pt fills the metoprolol and no the atenolol

## 2016-02-07 NOTE — Telephone Encounter (Signed)
Pt has been taking atenolol and has 2 pills left; when pt was seen for annual on 01/31/16 med was changed from atenolol to metoprolol due to atenolol being on back order. Pt spoke with [facility] and has atenolol in pharmacy. Pt wants to know if should take the atenolol or metoprolol. Pt request cb.

## 2016-02-08 ENCOUNTER — Ambulatory Visit: Payer: Medicare Other | Admitting: *Deleted

## 2016-02-08 VITALS — Ht 68.5 in | Wt 184.6 lb

## 2016-02-08 DIAGNOSIS — Z800 Family history of malignant neoplasm of digestive organs: Secondary | ICD-10-CM

## 2016-02-08 MED ORDER — NA SULFATE-K SULFATE-MG SULF 17.5-3.13-1.6 GM/177ML PO SOLN: 1.0000 | Freq: Once | ORAL | 0 refills | Status: AC

## 2016-02-08 NOTE — Progress Notes (Signed)
Denies allergies to eggs or soy products. Denies complications with sedation or anesthesia. Denies O2 use. Denies use of diet or weight loss medications.  Emmi instructions declined for colonoscopy.  

## 2016-02-09 ENCOUNTER — Encounter: Payer: Self-pay | Admitting: Gastroenterology

## 2016-02-15 ENCOUNTER — Ambulatory Visit (AMBULATORY_SURGERY_CENTER): Payer: Medicare Other | Admitting: Gastroenterology

## 2016-02-15 ENCOUNTER — Encounter: Payer: Self-pay | Admitting: Gastroenterology

## 2016-02-15 VITALS — BP 131/68 | HR 53 | Temp 97.3°F | Resp 14 | Ht 68.0 in | Wt 184.0 lb

## 2016-02-15 DIAGNOSIS — Z1211 Encounter for screening for malignant neoplasm of colon: Secondary | ICD-10-CM

## 2016-02-15 DIAGNOSIS — D120 Benign neoplasm of cecum: Secondary | ICD-10-CM

## 2016-02-15 DIAGNOSIS — Z800 Family history of malignant neoplasm of digestive organs: Secondary | ICD-10-CM | POA: Diagnosis not present

## 2016-02-15 DIAGNOSIS — Z1212 Encounter for screening for malignant neoplasm of rectum: Secondary | ICD-10-CM | POA: Diagnosis not present

## 2016-02-15 MED ORDER — SODIUM CHLORIDE 0.9 % IV SOLN: 500.0000 mL | INTRAVENOUS | Status: DC

## 2016-02-15 NOTE — Progress Notes (Signed)
Report given to PACU RN, vss 

## 2016-02-15 NOTE — Patient Instructions (Signed)
Impression/Recommendations:  Polyp handout given. Hemorrhoid handout given.  Repeat colonoscopy in 5 years if polyp is precancerous.  YOU HAD AN ENDOSCOPIC PROCEDURE TODAY AT [facility]:   Refer to the procedure report that was given to you for any specific questions about what was found during the examination.  If the procedure report does not answer your questions, please call your gastroenterologist to clarify.  If you requested that your care partner not be given the details of your procedure findings, then the procedure report has been included in a sealed envelope for you to review at your convenience later.  YOU SHOULD EXPECT: Some feelings of bloating in the abdomen. Passage of more gas than usual.  Walking can help get rid of the air that was put into your GI tract during the procedure and reduce the bloating. If you had a lower endoscopy (such as a colonoscopy or flexible sigmoidoscopy) you may notice spotting of blood in your stool or on the toilet paper. If you underwent a bowel prep for your procedure, you may not have a normal bowel movement for a few days.  Please Note:  You might notice some irritation and congestion in your nose or some drainage.  This is from the oxygen used during your procedure.  There is no need for concern and it should clear up in a day or so.  SYMPTOMS TO REPORT IMMEDIATELY:   Following lower endoscopy (colonoscopy or flexible sigmoidoscopy):  Excessive amounts of blood in the stool  Significant tenderness or worsening of abdominal pains  Swelling of the abdomen that is new, acute  Fever of 100F or higher For urgent or emergent issues, a gastroenterologist can be reached at any hour by calling [phone].   DIET:  We do recommend a small meal at first, but then you may proceed to your regular diet.  Drink plenty of fluids but you should avoid alcoholic beverages for 24 hours.  ACTIVITY:  You should plan to take it easy for the  rest of today and you should NOT DRIVE or use heavy machinery until tomorrow (because of the sedation medicines used during the test).    FOLLOW UP: Our staff will call the number listed on your records the next business day following your procedure to check on you and address any questions or concerns that you may have regarding the information given to you following your procedure. If we do not reach you, we will leave a message.  However, if you are feeling well and you are not experiencing any problems, there is no need to return our call.  We will assume that you have returned to your regular daily activities without incident.  If any biopsies were taken you will be contacted by phone or by letter within the next 1-3 weeks.  Please call us at [phone] if you have not heard about the biopsies in 3 weeks.    SIGNATURES/CONFIDENTIALITY: You and/or your care partner have signed paperwork which will be entered into your electronic medical record.  These signatures attest to the fact that that the information above on your After Visit Summary has been reviewed and is understood.  Full responsibility of the confidentiality of this discharge information lies with you and/or your care-partner.

## 2016-02-15 NOTE — Op Note (Signed)
[place] Patient Name: Jerry Farrell Procedure Date: 02/15/2016 8:57 AM MRN: [id number] Endoscopist: Ladene Artist , MD Age: 70 Referring MD:  Date of Birth: Jan 01, 1946 Gender: Male Account #: [id number] Procedure:                Colonoscopy Indications:              Screening in patient at increased risk: Family                            history of 1st-degree relative with colorectal                            cancer. Half brother with colon cancer in his 95s. Medicines:                Monitored Anesthesia Care Procedure:                Pre-Anesthesia Assessment:                           - Prior to the procedure, a History and Physical                            was performed, and patient medications and                            allergies were reviewed. The patient's tolerance of                            previous anesthesia was also reviewed. The risks                            and benefits of the procedure and the sedation                            options and risks were discussed with the patient.                            All questions were answered, and informed consent                            was obtained. Prior Anticoagulants: The patient has                            taken no previous anticoagulant or antiplatelet                            agents. ASA Grade Assessment: II - A patient with                            mild systemic disease. After reviewing the risks                            and benefits, the patient was deemed in  satisfactory condition to undergo the procedure.                           After obtaining informed consent, the colonoscope                            was passed under direct vision. Throughout the                            procedure, the patient's blood pressure, pulse, and                            oxygen saturations were monitored continuously. The                            Model PCF-H190L  [id number]) scope was introduced                            through the anus and advanced to the the cecum,                            identified by appendiceal orifice and ileocecal                            valve. The ileocecal valve, appendiceal orifice,                            and rectum were photographed. The quality of the                            bowel preparation was adequate. The patient                            tolerated the procedure well. The colonoscopy was                            somewhat difficult due to significant looping. Scope In: 9:07:46 AM Scope Out: 9:23:35 AM Scope Withdrawal Time: 0 hours 11 minutes 38 seconds  Total Procedure Duration: 0 hours 15 minutes 49 seconds  Findings:                 The perianal and digital rectal examinations were                            normal.                           A 4 mm polyp was found in the cecum. The polyp was                            sessile. The polyp was removed with a cold biopsy                            forceps. Resection and retrieval were complete.  Internal hemorrhoids were found during                            retroflexion. The hemorrhoids were medium-sized and                            Grade I (internal hemorrhoids that do not prolapse).                           The exam was otherwise without abnormality on                            direct and retroflexion views. Complications:            No immediate complications. Estimated blood loss:                            None. Estimated Blood Loss:     Estimated blood loss: none. Impression:               - One 4 mm polyp in the cecum, removed with a cold                            biopsy forceps. Resected and retrieved.                           - Internal hemorrhoids.                           - The examination was otherwise normal on direct                            and retroflexion views. Recommendation:           -  Repeat colonoscopy in 5 years for surveillance if                            polyp is precancerous, otherwise no plans for                            future screening colonoscopy due to age.                           - Patient has a contact number available for                            emergencies. The signs and symptoms of potential                            delayed complications were discussed with the                            patient. Return to normal activities tomorrow.                            Written discharge instructions were provided  to the                            patient.                           - Resume previous diet.                           - Continue present medications.                           - Await pathology results. Ladene Artist, MD 02/15/2016 9:28:16 AM This report has been signed electronically.

## 2016-02-16 ENCOUNTER — Telehealth: Payer: Self-pay | Admitting: *Deleted

## 2016-02-16 NOTE — Telephone Encounter (Signed)
  Follow up Call-  Call back number 02/15/2016  Post procedure Call Back phone  # [phone]  Permission to leave phone message Yes  Some recent data might be hidden     Patient questions:  Do you have a fever, pain , or abdominal swelling? No. Pain Score  0 *  Have you tolerated food without any problems? Yes.    Have you been able to return to your normal activities? Yes.    Do you have any questions about your discharge instructions: Diet   No. Medications  No. Follow up visit  No.  Do you have questions or concerns about your Care? No.  Actions: * If pain score is 4 or above: No action needed, pain <4.

## 2016-02-17 ENCOUNTER — Ambulatory Visit (INDEPENDENT_AMBULATORY_CARE_PROVIDER_SITE_OTHER): Payer: Medicare Other | Admitting: Pharmacist Clinician (PhC)/ Clinical Pharmacy Specialist

## 2016-02-17 VITALS — BP 180/96 | HR 64 | Ht 68.0 in

## 2016-02-17 DIAGNOSIS — I10 Essential (primary) hypertension: Secondary | ICD-10-CM

## 2016-02-17 MED ORDER — VALSARTAN-HYDROCHLOROTHIAZIDE 160-25 MG PO TABS: 1.0000 | ORAL_TABLET | Freq: Every day | ORAL | 5 refills | Status: DC

## 2016-02-17 NOTE — Assessment & Plan Note (Signed)
BP in the office today quite elevated when compared to his home readings.  I suspect some of this is related to him being in the area then driving back home to get his BP cuff (30 minutes) and back to the office.  I will stop the metoprolol and am going to increase the valsartan hctz to 160/25.  He can double up on his current tablets until they are gone.   Will repeat BMET in 2 weeks and see him back in 3-4 for follow up.

## 2016-02-17 NOTE — Progress Notes (Signed)
02/17/2016 LAUCHLAN SALONEN 01/07/1946 [id number]   HPI:  Jerry Farrell is a 70 y.o. male patient of Dr Gwenlyn Found, with a PMH below who presents today for hypertension clinic evaluation.  His cardiac history is significant for a single CABG in 1994.  He also has hypertension and treated hyperlipidemia.  He took atenolol for many years to control his BP, but because of the recent shortage was switched to metoprolol.  He believes that this is causing him to have headaches.  Looking at his history he, is 23 years post CABG and has no other significant heart disease, so probably does not need a beta blocker when other antihypertensive medications will work better.     Blood Pressure Goal:  150/90   Current Medications:  Metoprolol 50 mg qd  Valsartan hctz 80/12.5 qd  Family Hx:  1 brother with CABG, otherwise no significant family history, parents both lived into their 35's  Social Hx:  Never tobacco or alcohol; drinks 2-3 cups of coffee per day  Diet:  Mix of eating at home/eating out.  Does not add salt, although eats canned vegetables and some deep fried foods  Exercise:  Walks 3 miles 4 times per week  Home BP readings:  Has new Omron cuff, bought about 1 month ago; average of 17 readings was 137/74, with range of Q000111Q systolic.  Diastolic all WNL.   Intolerances:   Wt Readings from Last 3 Encounters:  02/15/16 184 lb (83.5 kg)  02/08/16 184 lb 9.6 oz (83.7 kg)  01/31/16 180 lb 12 oz (82 kg)   BP Readings from Last 3 Encounters:  02/17/16 (!) 180/96  02/15/16 131/68  01/31/16 126/78   Pulse Readings from Last 3 Encounters:  02/17/16 64  02/15/16 (!) 53  01/31/16 72    Current Outpatient Prescriptions  Medication Sig Dispense Refill  . aspirin 325 MG EC tablet Take 325 mg by mouth daily.    Marland Kitchen atorvastatin (LIPITOR) 10 MG tablet TAKE 1 TABLET BY MOUTH EVERY DAY 90 tablet 0  . brimonidine (ALPHAGAN) 0.2 % ophthalmic solution INSTILL 1 DROP IN LEFT EYE TWICE DAILY  0   . dicyclomine (BENTYL) 10 MG capsule Take 1 capsule (10 mg total) by mouth 3 (three) times daily as needed for spasms. 90 capsule 11  . dorzolamide-timolol (COSOPT) 22.3-6.8 MG/ML ophthalmic solution Use as directed    . loratadine (CLARITIN) 10 MG tablet Take 10 mg by mouth daily.    . metoprolol succinate (TOPROL-XL) 50 MG 24 hr tablet Take 1 tablet (50 mg total) by mouth daily. Take with or immediately following a meal. 90 tablet 3  . omeprazole (PRILOSEC) 40 MG capsule Take 1 capsule (40 mg total) by mouth daily. 30 capsule 11  . tadalafil (CIALIS) 20 MG tablet Take one by mouth up to every 3 days as needed before sexual activity.     . tamsulosin (FLOMAX) 0.4 MG CAPS capsule TAKE 1 CAPSULE (0.4 MG TOTAL) BY MOUTH DAILY. 90 capsule 1  . valsartan-hydrochlorothiazide (DIOVAN-HCT) 160-25 MG tablet Take 1 tablet by mouth daily. 30 tablet 5   Current Facility-Administered Medications  Medication Dose Route Frequency Provider Last Rate Last Dose  . 0.9 %  sodium chloride infusion  500 mL Intravenous Continuous Ladene Artist, MD        No Known Allergies  Past Medical History:  Diagnosis Date  . CAD (coronary artery disease)    cardiolite ok 3/05, stress test- low risk study 11/06  .  Chronic low back pain    with radiculopathy  . ED (erectile dysfunction)   . Gallstones    abd [place]- stable hamangioma, gallbladder sludge and tiny stones 09/2003  . GERD (gastroesophageal reflux disease)   . Hyperlipidemia   . Hypertension   . Transfusion history     Blood pressure (!) 180/96, pulse 64, height 5\' 8"  (1.727 m).  Essential hypertension BP in the office today quite elevated when compared to his home readings.  I suspect some of this is related to him being in the area then driving back home to get his BP cuff (30 minutes) and back to the office.  I will stop the metoprolol and am going to increase the valsartan hctz to 160/25.  He can double up on his current tablets until they are gone.    Will repeat BMET in 2 weeks and see him back in 3-4 for follow up.     Tommy Medal PharmD CPP [facility]

## 2016-02-17 NOTE — Patient Instructions (Signed)
Return for a a follow up appointment in 3 wks  Your blood pressure today is 180/96 (goal is < 150/90)  Check your blood pressure at home daily and keep record of the readings.  Take your BP meds as follows:  Stop metoprolol.    Take 2 of your valsartan/hydrochlorothiazide (80/12.5 mg)tablets until gone, then take 1 of the 160/25 mg tablets each day)  Go to the lab in about 2 weeks to get a check of your kidney function.  Bring all of your meds, your BP cuff and your record of home blood pressures to your next appointment.  Exercise as you're able, try to walk approximately 30 minutes per day.  Keep salt intake to a minimum, especially watch canned and prepared boxed foods.  Eat more fresh fruits and vegetables and fewer canned items.  Avoid eating in fast food restaurants.    HOW TO TAKE YOUR BLOOD PRESSURE: . Rest 5 minutes before taking your blood pressure. .  Don't smoke or drink caffeinated beverages for at least 30 minutes before. . Take your blood pressure before (not after) you eat. . Sit comfortably with your back supported and both feet on the floor (don't cross your legs). . Elevate your arm to heart level on a table or a desk. . Use the proper sized cuff. It should fit smoothly and snugly around your bare upper arm. There should be enough room to slip a fingertip under the cuff. The bottom edge of the cuff should be 1 inch above the crease of the elbow. . Ideally, take 3 measurements at one sitting and record the average.

## 2016-02-29 ENCOUNTER — Other Ambulatory Visit: Payer: Self-pay | Admitting: Cardiovascular Disease

## 2016-02-29 DIAGNOSIS — I10 Essential (primary) hypertension: Secondary | ICD-10-CM | POA: Diagnosis not present

## 2016-02-29 LAB — BASIC METABOLIC PANEL
BUN: 12 mg/dL (ref 7–25)
CALCIUM: 9.2 mg/dL (ref 8.6–10.3)
CO2: 24 mmol/L (ref 20–31)
Chloride: 99 mmol/L (ref 98–110)
Creat: 1.06 mg/dL (ref 0.70–1.18)
GLUCOSE: 93 mg/dL (ref 65–99)
Potassium: 4.1 mmol/L (ref 3.5–5.3)
SODIUM: 136 mmol/L (ref 135–146)

## 2016-03-01 ENCOUNTER — Encounter: Payer: Self-pay | Admitting: Gastroenterology

## 2016-03-13 ENCOUNTER — Ambulatory Visit (INDEPENDENT_AMBULATORY_CARE_PROVIDER_SITE_OTHER): Payer: Medicare Other | Admitting: Pharmacist

## 2016-03-13 VITALS — BP 162/88 | HR 64

## 2016-03-13 DIAGNOSIS — I10 Essential (primary) hypertension: Secondary | ICD-10-CM | POA: Diagnosis not present

## 2016-03-13 NOTE — Patient Instructions (Addendum)
Return for a follow up appointment as scheduled next year with Dr. Gwenlyn Found  Call the clinic if your pressure trends up (goal less than 140/90) or you have any other concerns.  Check your blood pressure at home daily (if able) and keep record of the readings.   Take your BP meds as follows: Continue valsartan 160/25mg  daily  Bring all of your meds, your BP cuff and your record of home blood pressures to your next appointment.  Exercise as you're able, try to walk approximately 30 minutes per day.  Keep salt intake to a minimum, especially watch canned and prepared boxed foods.  Eat more fresh fruits and vegetables and fewer canned items.  Avoid eating in fast food restaurants.    HOW TO TAKE YOUR BLOOD PRESSURE: . Rest 5 minutes before taking your blood pressure. .  Don't smoke or drink caffeinated beverages for at least 30 minutes before. . Take your blood pressure before (not after) you eat. . Sit comfortably with your back supported and both feet on the floor (don't cross your legs). . Elevate your arm to heart level on a table or a desk. . Use the proper sized cuff. It should fit smoothly and snugly around your bare upper arm. There should be enough room to slip a fingertip under the cuff. The bottom edge of the cuff should be 1 inch above the crease of the elbow. . Ideally, take 3 measurements at one sitting and record the average.

## 2016-03-13 NOTE — Progress Notes (Signed)
Patient ID: Jerry Farrell                 DOB: July 26, 1945                      MRN: [id number]     HPI: Jerry Farrell is a 70 y.o. male patient of Dr. Gwenlyn Found with [place] [facility] who presents today for hypertension follow up. His cardiac history is significant for a single CABG in 1994.  He also has hypertension and treated hyperlipidemia.  He took atenolol for many years to control his BP, but because of the recent shortage was switched to metoprolol.  He believed that this is causing him to have headaches. Thus [employer] discontinued this medication and titrated his ARB/duiretic.   Current Medications:             Valsartan hctz 160/25 qd  Family Hx:             1 brother with CABG, otherwise no significant family history, parents both lived into their 18's  Social Hx:             Never tobacco or alcohol; drinks 2-3 cups of coffee per day  Diet:             Mix of eating at home/eating out.  Does not add salt, although eats canned vegetables and some deep fried foods  Exercise:             Walks 3 miles 4 times per week  Home BP readings:  His home readings mostly 120s-130s/70s with HR 70-80; except one or two BP measurements during the time that his granddaughter was hospitalized. He was very worried and stressed about the situation.    Wt Readings from Last 3 Encounters:  02/15/16 184 lb (83.5 kg)  02/08/16 184 lb 9.6 oz (83.7 kg)  01/31/16 180 lb 12 oz (82 kg)   BP Readings from Last 3 Encounters:  03/13/16 (!) 162/88  02/17/16 (!) 180/96  02/15/16 131/68   Pulse Readings from Last 3 Encounters:  03/13/16 64  02/17/16 64  02/15/16 (!) 53    Renal function: CrCl cannot be calculated (Unknown ideal weight.).  Past Medical History:  Diagnosis Date  . CAD (coronary artery disease)    cardiolite ok 3/05, stress test- low risk study 11/06  . Chronic low back pain    with radiculopathy  . ED (erectile dysfunction)   . Gallstones    abd [place]- stable hamangioma,  gallbladder sludge and tiny stones 09/2003  . GERD (gastroesophageal reflux disease)   . Hyperlipidemia   . Hypertension   . Transfusion history     Current Outpatient Prescriptions on File Prior to Visit  Medication Sig Dispense Refill  . aspirin 325 MG EC tablet Take 325 mg by mouth daily.    Marland Kitchen atorvastatin (LIPITOR) 10 MG tablet TAKE 1 TABLET BY MOUTH EVERY DAY 90 tablet 0  . brimonidine (ALPHAGAN) 0.2 % ophthalmic solution INSTILL 1 DROP IN LEFT EYE TWICE DAILY  0  . dorzolamide-timolol (COSOPT) 22.3-6.8 MG/ML ophthalmic solution 2 (two) times daily. Use as directed     . loratadine (CLARITIN) 10 MG tablet Take 10 mg by mouth daily.    . tadalafil (CIALIS) 20 MG tablet Take one by mouth up to every 3 days as needed before sexual activity.     . tamsulosin (FLOMAX) 0.4 MG CAPS capsule TAKE 1 CAPSULE (0.4 MG TOTAL) BY MOUTH DAILY.  90 capsule 1  . valsartan-hydrochlorothiazide (DIOVAN-HCT) 160-25 MG tablet Take 1 tablet by mouth daily. 30 tablet 5  . dicyclomine (BENTYL) 10 MG capsule Take 1 capsule (10 mg total) by mouth 3 (three) times daily as needed for spasms. (Patient not taking: Reported on 03/13/2016) 90 capsule 11  . omeprazole (PRILOSEC) 40 MG capsule Take 1 capsule (40 mg total) by mouth daily. (Patient not taking: Reported on 03/13/2016) 30 capsule 11   Current Facility-Administered Medications on File Prior to Visit  Medication Dose Route Frequency Provider Last Rate Last Dose  . 0.9 %  sodium chloride infusion  500 mL Intravenous Continuous Ladene Artist, MD        No Known Allergies  Blood pressure (!) 162/88, pulse 64, SpO2 98 %.   Assessment/Plan: Hypertension: BP today elevated, but home measurements are at goal. His home cuff measures accurately today in office. Based on this he may have a touch of white coat hypertension. Continue same medications as prescribed. Advised to call if pressures trend up. Otherwise follow up with Dr. Gwenlyn Found as scheduled next year and  hypertension clinic as needed.    Thank you, Lelan Pons. Patterson Hammersmith, [facility]  03/13/2016 9:57 AM

## 2016-03-14 ENCOUNTER — Encounter: Payer: Self-pay | Admitting: Pharmacist

## 2016-03-27 ENCOUNTER — Telehealth: Payer: Self-pay | Admitting: *Deleted

## 2016-03-27 NOTE — Telephone Encounter (Signed)
Spoke to patient who presented as walk-in for several concerns.  1 - notes he has had several med changes which started w atenolol shortages earlier this fall. Notes he had been doing well on atenolol. Then was changed to metoprolol, then metoprolol d/c'ed when his valsartan-HCTZ was increased. Since changes he feels he's been more fatigued in his legs. Denies cramping, pain etc. I noted he has been on lipitor but this is not new med.  2 - had episode of "flushing feeling" after bowling several frames at the bowling alley ~10 days ago. States he felt very lightheaded and near syncopal for about 30-40 mins.  Ate some ice, put his head betw legs - this helped and issue eventually resolved. Notes he does not drink alcohol, felt he was adequately hydrated at the time. He went home and checked his BP, which was "real high" (does not recall systolic, diastolic was 99). HR was 124. When he rechecked later, these had normalized.  3 - he had episode sat night while showering of feeling flushed and lightheaded after washing his feet, then standing up.   4 - endorses intermittent left arm pain. States this feels like it originates in his wrist. States pain dependent on position/movement. He has a soft marble sized bulge under skin that moves w palpation, he does not endorse pain when pressed.  Patient denies chest discomfort, shortness of breath, etc.    I recommended patient continue current med therapy. We discussed position changes and lowering water temp of shower. Informed him I would defer to provider to review and follow up w him later this week - may recommend PA OV to discuss his concerns or further potential med changes. Pt voiced understanding and informed me I could reach him at home to advise on next steps.

## 2016-03-27 NOTE — Telephone Encounter (Signed)
Needs to be seen by a MLP to sort this out  JJB

## 2016-03-28 NOTE — Telephone Encounter (Signed)
Spoke w patient who was amenable to PA visit. Scheduled to see Isaac Laud this Friday at 8:30am. Pt verbalized understanding of appt info and instruction to call if further concerns in interim.

## 2016-03-31 ENCOUNTER — Ambulatory Visit (INDEPENDENT_AMBULATORY_CARE_PROVIDER_SITE_OTHER): Payer: Medicare Other | Admitting: Physician Assistant

## 2016-03-31 VITALS — BP 148/90 | HR 92 | Ht 68.0 in | Wt 179.8 lb

## 2016-03-31 DIAGNOSIS — R42 Dizziness and giddiness: Secondary | ICD-10-CM

## 2016-03-31 DIAGNOSIS — I25708 Atherosclerosis of coronary artery bypass graft(s), unspecified, with other forms of angina pectoris: Secondary | ICD-10-CM | POA: Diagnosis not present

## 2016-03-31 DIAGNOSIS — E785 Hyperlipidemia, unspecified: Secondary | ICD-10-CM

## 2016-03-31 DIAGNOSIS — I10 Essential (primary) hypertension: Secondary | ICD-10-CM

## 2016-03-31 DIAGNOSIS — R0789 Other chest pain: Secondary | ICD-10-CM

## 2016-03-31 MED ORDER — CARVEDILOL 3.125 MG PO TABS: 3.1250 mg | ORAL_TABLET | Freq: Two times a day (BID) | ORAL | 3 refills | Status: DC

## 2016-03-31 NOTE — Progress Notes (Signed)
Cardiology Office Note    Date:  04/01/2016   ID:  Jerry Farrell, Jerry Farrell [birth year], MRN [id number]  PCP:  [name], MD  Cardiologist:  Dr. Gwenlyn Found  Chief Complaint  Patient presents with  . Follow-up    seen for Dr. Gwenlyn Found    History of Present Illness:  Jerry Farrell is a 70 y.o. male with PMH of CAD s/p CABG x 1 in 1994, HTN and HLD. There was some suspicion that his bypass surgery in 1994 was for left main disease, however no cath report was available. He also has a brother who recently underwent CABG as well. He was lost to follow-up for many years and was recently reestablished with cardiology service. On his last follow-up with Dr. Gwenlyn Found on 01/19/2016, his blood pressure was elevated in the [id number] systolic. He was referred to the hypertension clinic for adjustment of blood pressure medications. His lipid seems to be well-controlled based on the previous lab and is followed by his PCP. He was previously doing well on atenolol, Diovan, HCTZ. However due to shortage of atenolol, he was switched to metoprolol. On initial office visit with hypertension clinic, his metoprolol was discontinued and the valsartan HCTZ was increased to 160/25. His last office visit was on 03/13/2016, his blood pressure remained elevated in the office, however his home measurement was at goal. His home blood pressure cuff measures accurately in the office as well. It was felt that he has a touch of whitecoat hypertension. Therefore no further adjustment was made.  The patient, he has been having some worsening dizziness recently. He says there was one time he went bowling with his friends, near the end, he was feeling like he is going to pass out. After he got home he noted his blood pressure was well however and his heart rate was in the 110 to 120s range. He also had a similar episode and noted today showering, after he came out of his shower he recheck his blood pressure as well. His heart rate was 98 and his blood  pressure was normal. He has also been having intermittent "gas pain". It is not related to exertion and improved with burping. He used to walk up to 3 miles per day, however due to the recent dizziness, he has not been exercise for the past 3 weeks. He presents today for evaluation of gas pain and dizziness. I will add a low-dose carvedilol to help controlling his heart rate. Of note, his EKG today shows new T-wave inversion in the inferolateral leads that is not on the previous EKG in September. Even though his recent chest discomfort is atypical, given his history of bypass surgery, I will proceed with a treadmill nuclear stress test.   Past Medical History:  Diagnosis Date  . CAD (coronary artery disease)    cardiolite ok 3/05, stress test- low risk study 11/06  . Chronic low back pain    with radiculopathy  . ED (erectile dysfunction)   . Gallstones    abd [place]- stable hamangioma, gallbladder sludge and tiny stones 09/2003  . GERD (gastroesophageal reflux disease)   . Hyperlipidemia   . Hypertension   . Transfusion history     Past Surgical History:  Procedure Laterality Date  . CATARACT EXTRACTION W/PHACO  03/13/2012   Procedure: CATARACT EXTRACTION PHACO AND INTRAOCULAR LENS PLACEMENT (IOC);  Surgeon: Adonis Brook, MD;  Location: [facility];  Service: Ophthalmology;  Laterality: Left;  . [place]  x 1  done here at [facility]  . GASTRECTOMY     partial  . MEMBRANE PEEL  03/13/2012   Procedure: MEMBRANE PEEL;  Surgeon: Adonis Brook, MD;  Location: [facility];  Service: Ophthalmology;  Laterality: Left;  . PARS PLANA VITRECTOMY  03/13/2012   Procedure: PARS PLANA VITRECTOMY WITH 23 GAUGE;  Surgeon: Adonis Brook, MD;  Location: [facility];  Service: Ophthalmology;  Laterality: Left;    Current Medications: Outpatient Medications Prior to Visit  Medication Sig Dispense Refill  . aspirin 325 MG EC tablet Take 325 mg by mouth daily.    Marland Kitchen atorvastatin (LIPITOR) 10 MG tablet TAKE  1 TABLET BY MOUTH EVERY DAY 90 tablet 0  . brimonidine (ALPHAGAN) 0.2 % ophthalmic solution INSTILL 1 DROP IN LEFT EYE TWICE DAILY  0  . dicyclomine (BENTYL) 10 MG capsule Take 1 capsule (10 mg total) by mouth 3 (three) times daily as needed for spasms. 90 capsule 11  . dorzolamide-timolol (COSOPT) 22.3-6.8 MG/ML ophthalmic solution Place 1 drop into the left eye 2 (two) times daily. Use as directed     . omeprazole (PRILOSEC) 40 MG capsule Take 1 capsule (40 mg total) by mouth daily. 30 capsule 11  . tamsulosin (FLOMAX) 0.4 MG CAPS capsule TAKE 1 CAPSULE (0.4 MG TOTAL) BY MOUTH DAILY. 90 capsule 1  . valsartan-hydrochlorothiazide (DIOVAN-HCT) 160-25 MG tablet Take 1 tablet by mouth daily. 30 tablet 5  . loratadine (CLARITIN) 10 MG tablet Take 10 mg by mouth daily.    . tadalafil (CIALIS) 20 MG tablet Take one by mouth up to every 3 days as needed before sexual activity.      Facility-Administered Medications Prior to Visit  Medication Dose Route Frequency Provider Last Rate Last Dose  . 0.9 %  sodium chloride infusion  500 mL Intravenous Continuous Ladene Artist, MD         Allergies:   Patient has no known allergies.   Social History   Social History  . Marital status: Married    Spouse name: N/A  . Number of children: 3  . Years of education: N/A   Occupational History  . [employer] Retired   Social History Main Topics  . Smoking status: Never Smoker  . Smokeless tobacco: Never Used  . Alcohol use No  . Drug use: No  . Sexual activity: Yes   Other Topics Concern  . Not on file   Social History Narrative  . No narrative on file     Family History:  The patient's family history includes Cancer in his brother, brother, and sister; Colon cancer in his brother; Hypertension in his mother.   ROS:   Please see the history of present illness.    ROS All other systems reviewed and are negative.   PHYSICAL EXAM:   VS:  BP (!) 148/90   Pulse 92   Ht 5\' 8"  (1.727 m)   Wt  179 lb 12.8 oz (81.6 kg)   BMI 27.34 kg/m    GEN: Well nourished, well developed, in no acute distress  HEENT: normal  Neck: no JVD, carotid bruits, or masses Cardiac: RRR; no murmurs, rubs, or gallops,no edema  Respiratory:  clear to auscultation bilaterally, normal work of breathing GI: soft, nontender, nondistended, + BS MS: no deformity or atrophy  Skin: warm and dry, no rash Neuro:  Alert and Oriented x 3, Strength and sensation are intact Psych: euthymic mood, full affect  Wt Readings from Last 3 Encounters:  03/31/16  179 lb 12.8 oz (81.6 kg)  02/15/16 184 lb (83.5 kg)  02/08/16 184 lb 9.6 oz (83.7 kg)      Studies/Labs Reviewed:   EKG:  EKG is ordered today.  The ekg ordered today demonstrates Normal sinus rhythm with new T wave inversion in inferolateral leads.  Recent Labs: 01/26/2016: ALT 20; Hemoglobin 15.9; Platelets 140.0; TSH 1.16 02/29/2016: BUN 12; Creat 1.06; Potassium 4.1; Sodium 136   Lipid Panel    Component Value Date/Time   CHOL 156 01/26/2016 0925   TRIG 191.0 (H) 01/26/2016 0925   HDL 51.10 01/26/2016 0925   CHOLHDL 3 01/26/2016 0925   VLDL 38.2 01/26/2016 0925   LDLCALC 67 01/26/2016 0925    Additional studies/ records that were reviewed today include:   Previous office note and EKG    ASSESSMENT:    1. Other chest pain   2. Coronary artery disease involving coronary bypass graft of native heart with other forms of angina pectoris ([place])   3. Essential hypertension   4. Hyperlipidemia, unspecified hyperlipidemia type   5. Dizziness      PLAN:  In order of problems listed above:  1. Atypical chest pain  - Despite the fact that his chest pain is fairly atypical, however there is significant change with new T wave inversion in the inferolateral leads on today's EKG. I plan to obtain outpatient treadmill nuclear stress test.  2. Dizziness: Unclear cause for his dizziness, he says usually recurs his blood pressure is running slightly  high. I will add carvedilol 3.125 mg twice a day. His heart rate has been running on the higher side since his BB was stopped recently.   3. CAD s/p remote CABG in 1994: No recent workup. See above, despite atypical chest pain, new EKG changes concerning. Plan for outpatient treadmill nuclear stress test.  4. HTN: Blood pressure mildly elevated today, however based on recent finding, patient likely has some degree of white coat syndrome as his blood pressure is usually within normal limits at home and only become elevated during office visit. During the last hypertension clinic visit, his home blood pressure cuff was measured against the office blood pressure device, the results are very similar..  5. HLD: Continue on Lipitor 10 mg daily.   Medication Adjustments/Labs and Tests Ordered: Current medicines are reviewed at length with the patient today.  Concerns regarding medicines are outlined above.  Medication changes, Labs and Tests ordered today are listed in the Patient Instructions below. Patient Instructions  Medication Instructions: Jerry Deforest, PA-C has recommended making the following medication changes: 1. START Carvedilol 3.125 mg - take 1 tablet by mouth twice daily  Labwork: NONE ORDERED  Testing/Procedures: 1. Exercise Myoview Stress test - Your physician has requested that you have en exercise stress myoview. For further information please visit [email or web address]. Please follow instruction sheet, as given.  >>HOLD Carvedilol the night before test and morning of test  >>Absolutely NO caffeinated beverages the day of test  Follow-up: Isaac Laud recommends that you schedule a follow-up appointment in 1-2 months.  If you need a refill on your cardiac medications before your next appointment, please call your pharmacy.    Hilbert Corrigan, [place]  04/01/2016 1:22 AM    [address], [place], [place]  [address] Phone: [phone]; Fax: [phone]

## 2016-03-31 NOTE — Patient Instructions (Signed)
Medication Instructions: Almyra Deforest, PA-C has recommended making the following medication changes: 1. START Carvedilol 3.125 mg - take 1 tablet by mouth twice daily  Labwork: NONE ORDERED  Testing/Procedures: 1. Exercise Myoview Stress test - Your physician has requested that you have en exercise stress myoview. For further information please visit [email or web address]. Please follow instruction sheet, as given.  >>HOLD Carvedilol the night before test and morning of test  >>Absolutely NO caffeinated beverages the day of test  Follow-up: Isaac Laud recommends that you schedule a follow-up appointment in 1-2 months.  If you need a refill on your cardiac medications before your next appointment, please call your pharmacy.

## 2016-04-01 ENCOUNTER — Encounter: Payer: Self-pay | Admitting: Physician Assistant

## 2016-04-03 ENCOUNTER — Telehealth: Payer: Self-pay | Admitting: Family Medicine

## 2016-04-03 NOTE — Telephone Encounter (Signed)
noted 

## 2016-04-03 NOTE — Telephone Encounter (Signed)
Patient Name: Jerry Farrell  DOB: [birth year]    Initial Comment caller states he is dizzy and his legs are weak   Nurse Assessment  Nurse: Raphael Gibney, RN, Vanita Ingles Date/Time (Eastern Time): 04/03/2016 9:30:54 AM  Confirm and document reason for call. If symptomatic, describe symptoms. ---Caller states he is dizzy and light headed. Feels weak in his legs. he can walk. He was dizzy a couple of weeks ago and went to his cardiologist. Has appt to do treadmill next week. Has been having lot of gas. Gaviscon helped yesterday. No chest pain or SOB. He is taking carvediolol and metoprolol. No fever.  Does the patient have any new or worsening symptoms? ---Yes  Will a triage be completed? ---Yes  Related visit to physician within the last 2 weeks? ---No  Does the PT have any chronic conditions? (i.e. diabetes, asthma, etc.) ---Yes  List chronic conditions. ---HTN  Is this a behavioral health or substance abuse call? ---No     Guidelines    Guideline Title Affirmed Question Affirmed Notes  Dizziness - Lightheadedness Taking a medicine that could cause dizziness (e.g., blood pressure medications, diuretics)    Final Disposition User   See Physician within 24 Hours [place], RN, Vera    Comments  No appts available with Dr. Glori Bickers within 24 hrs . Pt does not want to see another doctor. Please call pt back regarding appt   Referrals  GO TO FACILITY REFUSED   Disagree/Comply: Disagree  Disagree/Comply Reason: Disagree with instructions

## 2016-04-03 NOTE — Telephone Encounter (Signed)
I spoke with pt and he does not know where any other [facility] offices are; pt said h/a and no energy for one week on and off. Pt scheduled 30 min appt on 04/04/16 with R Baity NP and pt advised if condition changes or worsens prior to appt pt will go to [facility]. Pt voiced understanding.

## 2016-04-04 ENCOUNTER — Encounter: Payer: Self-pay | Admitting: Internal Medicine

## 2016-04-04 ENCOUNTER — Ambulatory Visit (INDEPENDENT_AMBULATORY_CARE_PROVIDER_SITE_OTHER): Payer: Medicare Other | Admitting: Internal Medicine

## 2016-04-04 VITALS — BP 142/78 | HR 78 | Temp 98.5°F | Wt 178.8 lb

## 2016-04-04 DIAGNOSIS — R5383 Other fatigue: Secondary | ICD-10-CM | POA: Diagnosis not present

## 2016-04-04 DIAGNOSIS — R42 Dizziness and giddiness: Secondary | ICD-10-CM

## 2016-04-04 DIAGNOSIS — R140 Abdominal distension (gaseous): Secondary | ICD-10-CM

## 2016-04-04 DIAGNOSIS — R1013 Epigastric pain: Secondary | ICD-10-CM | POA: Diagnosis not present

## 2016-04-04 LAB — LIPASE: LIPASE: 61 U/L — AB (ref 11.0–59.0)

## 2016-04-04 LAB — H. PYLORI ANTIBODY, IGG: H PYLORI IGG: NEGATIVE

## 2016-04-04 LAB — AMYLASE: AMYLASE: 66 U/L (ref 27–131)

## 2016-04-04 NOTE — Patient Instructions (Signed)
Dizziness Dizziness is a common problem. It makes you feel unsteady or lightheaded. You may feel like you are about to pass out (faint). Dizziness can lead to injury if you stumble or fall. Anyone can get dizzy, but dizziness is more common in older adults. This condition can be caused by a number of things, including:  Medicines.  Dehydration.  Illness. Follow these instructions at home: Following these instructions may help with your condition: Eating and drinking  Drink enough fluid to keep your pee (urine) clear or pale yellow. This helps to keep you from getting dehydrated. Try to drink more clear fluids, such as water.  Do not drink alcohol.  Limit how much caffeine you drink or eat if told by your doctor.  Limit how much salt you drink or eat if told by your doctor. Activity  Avoid making quick movements.  When you stand up from sitting in a chair, steady yourself until you feel okay.  In the morning, first sit up on the side of the bed. When you feel okay, stand slowly while you hold onto something. Do this until you know that your balance is fine.  Move your legs often if you need to stand in one place for a long time. Tighten and relax your muscles in your legs while you are standing.  Do not drive or use heavy machinery if you feel dizzy.  Avoid bending down if you feel dizzy. Place items in your home so that they are easy for you to reach without leaning over. Lifestyle  Do not use any tobacco products, including cigarettes, chewing tobacco, or electronic cigarettes. If you need help quitting, ask your doctor.  Try to lower your stress level, such as with yoga or meditation. Talk with your doctor if you need help. General instructions  Watch your dizziness for any changes.  Take medicines only as told by your doctor. Talk with your doctor if you think that your dizziness is caused by a medicine that you are taking.  Tell a friend or a family member that you are  feeling dizzy. If he or she notices any changes in your behavior, have this person call your doctor.  Keep all follow-up visits as told by your doctor. This is important. Contact a doctor if:  Your dizziness does not go away.  Your dizziness or light-headedness gets worse.  You feel sick to your stomach (nauseous).  You have trouble hearing.  You have new symptoms.  You are unsteady on your feet or you feel like the room is spinning. Get help right away if:  You throw up (vomit) or have diarrhea and are unable to eat or drink anything.  You have trouble:  Talking.  Walking.  Swallowing.  Using your arms, hands, or legs.  You feel generally weak.  You are not thinking clearly or you have trouble forming sentences. It may take a friend or family member to notice this.  You have:  Chest pain.  Pain in your belly (abdomen).  Shortness of breath.  Sweating.  Your vision changes.  You are bleeding.  You have a headache.  You have neck pain or a stiff neck.  You have a fever. This information is not intended to replace advice given to you by your health care provider. Make sure you discuss any questions you have with your health care provider. Document Released: 03/30/2011 Document Revised: 09/16/2015 Document Reviewed: 04/06/2014 Elsevier Interactive Patient Education  2017 [employer]  

## 2016-04-04 NOTE — Progress Notes (Signed)
Subjective:    Patient ID: Jerry Farrell, male    DOB: [birth year], 70 y.o.   MRN: [id number]  HPI  Pt presents to the clinic today with c/o fatigue, dizziness and bloating. This has been intermittent over the last 3 weeks. He describes the dizziness as a sense of unsteadiness, not that the room is spinning. He reports the dizziness seems worse with position changes or walking. He reports he sleeps well at night, and he feels rested when he wakes up. He reports he used to walk 3 miles 4 times a week, but lately has not been able to do this. His appetite is normal. He does have a history of GERD with esophagitis. Since he started having more reflux and bloating, he started taking Prilosec daily but is not sure if it has helped. He had an upper GI in 1998, which showed some mild gastritis. His bowel and bladder are normal. He denies chest pain or shortness of breath, but reports something feels "off" in his chest. He was seen by cardiology on 12/8. Carvedilol was added to his regimen and he reports his Valsartan was doubled. He did have some changes on his ECG and has a stress test scheduled for next Tuesday. He reports his BP normally runs 130's/80's at home, but notes it seems consistently higher at the doctor's office. Last 3 readings: 142/78, 148/90, 162/88. He denies runny nose, nasal congestion, or cough. He has not been sick recently. He has not taken anything OTC for this. He denies changes in diet or stress level.  Review of Systems      Past Medical History:  Diagnosis Date  . CAD (coronary artery disease)    cardiolite ok 3/05, stress test- low risk study 11/06  . Chronic low back pain    with radiculopathy  . ED (erectile dysfunction)   . Gallstones    abd [place]- stable hamangioma, gallbladder sludge and tiny stones 09/2003  . GERD (gastroesophageal reflux disease)   . Hyperlipidemia   . Hypertension   . Transfusion history     Current Outpatient Prescriptions  Medication Sig  Dispense Refill  . aspirin 325 MG EC tablet Take 325 mg by mouth daily.    Marland Kitchen atorvastatin (LIPITOR) 10 MG tablet TAKE 1 TABLET BY MOUTH EVERY DAY 90 tablet 0  . brimonidine (ALPHAGAN) 0.2 % ophthalmic solution INSTILL 1 DROP IN LEFT EYE TWICE DAILY  0  . carvedilol (COREG) 3.125 MG tablet Take 1 tablet (3.125 mg total) by mouth 2 (two) times daily with a meal. 180 tablet 3  . dicyclomine (BENTYL) 10 MG capsule Take 1 capsule (10 mg total) by mouth 3 (three) times daily as needed for spasms. 90 capsule 11  . dorzolamide-timolol (COSOPT) 22.3-6.8 MG/ML ophthalmic solution Place 1 drop into the left eye 2 (two) times daily. Use as directed     . Lifitegrast (XIIDRA) 5 % SOLN Place 1 drop into both eyes 2 (two) times daily.    Marland Kitchen omeprazole (PRILOSEC) 40 MG capsule Take 1 capsule (40 mg total) by mouth daily. 30 capsule 11  . tamsulosin (FLOMAX) 0.4 MG CAPS capsule TAKE 1 CAPSULE (0.4 MG TOTAL) BY MOUTH DAILY. 90 capsule 1  . valsartan-hydrochlorothiazide (DIOVAN-HCT) 160-25 MG tablet Take 1 tablet by mouth daily. 30 tablet 5   Current Facility-Administered Medications  Medication Dose Route Frequency Provider Last Rate Last Dose  . 0.9 %  sodium chloride infusion  500 mL Intravenous Continuous Ladene Artist, MD  No Known Allergies  Family History  Problem Relation Age of Onset  . Cancer Sister     unknown type, pt feels alcohol related  . Cancer Brother     colon  . Cancer Brother     throat, pt feels alcohol related  . Colon cancer Brother     half brother  . Hypertension Mother     Social History   Social History  . Marital status: Married    Spouse name: N/A  . Number of children: 3  . Years of education: N/A   Occupational History  . [employer] Retired   Social History Main Topics  . Smoking status: Never Smoker  . Smokeless tobacco: Never Used  . Alcohol use No  . Drug use: No  . Sexual activity: Yes   Other Topics Concern  . Not on file   Social History  Narrative  . No narrative on file     Constitutional: Pt reports fatigue. Denies fever, malaise, headache or abrupt weight changes.  HEENT: Denies eye pain, eye redness, ear pain, ringing in the ears, wax buildup, runny nose, nasal congestion, bloody nose, or sore throat. Respiratory: Denies difficulty breathing, shortness of breath, cough or sputum production.   Cardiovascular: Denies chest pain, chest tightness, palpitations or swelling in the hands or feet.  Gastrointestinal: Pt reports bloating. Denies abdominal pain, constipation, diarrhea or blood in the stool.  GU: Denies urgency, frequency, pain with urination, burning sensation, blood in urine, odor or discharge. Musculoskeletal: Denies decrease in range of motion, difficulty with gait, muscle pain or joint pain and swelling.  Skin: Denies redness, rashes, lesions or ulcercations.  Neurological: Pt reports dizziness. Denies difficulty with memory, difficulty with speech or problems with balance and coordination.  Psych: Denies anxiety, depression, SI/HI.  No other specific complaints in a complete review of systems (except as listed in HPI above).  Objective:   Physical Exam   BP (!) 142/78   Pulse 78   Temp 98.5 F (36.9 C) (Oral)   Wt 178 lb 12 oz (81.1 kg)   SpO2 99%   BMI 27.18 kg/m  Wt Readings from Last 3 Encounters:  04/04/16 178 lb 12 oz (81.1 kg)  03/31/16 179 lb 12.8 oz (81.6 kg)  02/15/16 184 lb (83.5 kg)    General: Appears his stated age, well developed, well nourished in NAD. Skin: Warm, dry and intact. No rashes noted. HEENT: Head: normal shape and size; Eyes: sclera white, no icterus, conjunctiva pink, PERRLA and EOMs intact; Ears: Tm's gray and intact, normal light reflex; Throat/Mouth: Teeth present, mucosa pink and moist, no exudate, lesions or ulcerations noted.  Neck:  No adenopathy noted.  Cardiovascular: Normal rate and rhythm. S1,S2 noted.  No murmur, rubs or gallops noted. No carotid bruits  noted. Pulmonary/Chest: Normal effort and positive vesicular breath sounds. No respiratory distress. No wheezes, rales or ronchi noted.  Abdomen: Soft and tender in the epigastric region. Normal bowel sounds. No distention or masses noted.  Musculoskeletal:No difficulty with gait.  Neurological: Alert and oriented. Coordination normal.   BMET    Component Value Date/Time   NA 136 02/29/2016 1133   K 4.1 02/29/2016 1133   CL 99 02/29/2016 1133   CO2 24 02/29/2016 1133   GLUCOSE 93 02/29/2016 1133   BUN 12 02/29/2016 1133   CREATININE 1.06 02/29/2016 1133   CALCIUM 9.2 02/29/2016 1133   GFRNONAA 85 (L) 03/06/2012 0837   GFRAA >90 03/06/2012 0837    Lipid Panel  Component Value Date/Time   CHOL 156 01/26/2016 0925   TRIG 191.0 (H) 01/26/2016 0925   HDL 51.10 01/26/2016 0925   CHOLHDL 3 01/26/2016 0925   VLDL 38.2 01/26/2016 0925   LDLCALC 67 01/26/2016 0925    CBC    Component Value Date/Time   WBC 8.7 01/26/2016 0925   RBC 5.38 01/26/2016 0925   HGB 15.9 01/26/2016 0925   HCT 47.0 01/26/2016 0925   PLT 140.0 (L) 01/26/2016 0925   MCV 87.3 01/26/2016 0925   MCH 29.6 03/06/2012 0837   MCHC 33.9 01/26/2016 0925   RDW 13.8 01/26/2016 0925   LYMPHSABS 1.7 01/26/2016 0925   MONOABS 0.6 01/26/2016 0925   EOSABS 0.1 01/26/2016 0925   BASOSABS 0.0 01/26/2016 0925    Hgb A1C No results found for: HGBA1C         Assessment & Plan:   Fatigue, dizziness, bloating and epigastric pain:  BP's have been more elevated lately,  ?if this is contributing factor- he is working with cardiology for this Also awaiting stress testing Orthostatics negative Labs from 01/2016 reviewed Will check H Pylori, amylase and lipase today Advised him to continue current medications at this time Avoid walking 3 miles, until we can identify the issue at hand Encouraged him to make sure he is drinking plenty of water Red flags discussed- pt understands he is to go straight to ER if he  notices any of these s/s  Will follow up after labs, Follow up with PCP as previously scheduled Webb Silversmith, NP

## 2016-04-06 ENCOUNTER — Telehealth (HOSPITAL_COMMUNITY): Payer: Self-pay

## 2016-04-06 NOTE — Telephone Encounter (Signed)
Encounter complete. 

## 2016-04-09 ENCOUNTER — Emergency Department (HOSPITAL_COMMUNITY): Payer: Medicare Other

## 2016-04-09 ENCOUNTER — Encounter (HOSPITAL_COMMUNITY): Payer: Self-pay

## 2016-04-09 ENCOUNTER — Emergency Department (HOSPITAL_COMMUNITY)
Admission: EM | Admit: 2016-04-09 | Discharge: 2016-04-09 | Disposition: A | Discharge status: Home / Self Care | Payer: Medicare Other | Attending: Emergency Medicine | Admitting: Emergency Medicine

## 2016-04-09 DIAGNOSIS — R0689 Other abnormalities of breathing: Secondary | ICD-10-CM | POA: Insufficient documentation

## 2016-04-09 DIAGNOSIS — I10 Essential (primary) hypertension: Secondary | ICD-10-CM | POA: Diagnosis not present

## 2016-04-09 DIAGNOSIS — I2510 Atherosclerotic heart disease of native coronary artery without angina pectoris: Secondary | ICD-10-CM | POA: Insufficient documentation

## 2016-04-09 DIAGNOSIS — Z7982 Long term (current) use of aspirin: Secondary | ICD-10-CM | POA: Diagnosis not present

## 2016-04-09 DIAGNOSIS — R070 Pain in throat: Secondary | ICD-10-CM | POA: Insufficient documentation

## 2016-04-09 DIAGNOSIS — Z951 Presence of aortocoronary bypass graft: Secondary | ICD-10-CM | POA: Insufficient documentation

## 2016-04-09 DIAGNOSIS — R42 Dizziness and giddiness: Secondary | ICD-10-CM | POA: Insufficient documentation

## 2016-04-09 DIAGNOSIS — R0989 Other specified symptoms and signs involving the circulatory and respiratory systems: Secondary | ICD-10-CM | POA: Diagnosis not present

## 2016-04-09 DIAGNOSIS — R0789 Other chest pain: Secondary | ICD-10-CM | POA: Diagnosis not present

## 2016-04-09 DIAGNOSIS — R079 Chest pain, unspecified: Secondary | ICD-10-CM | POA: Diagnosis not present

## 2016-04-09 LAB — CBC
HCT: 43.8 % (ref 39.0–52.0)
HEMOGLOBIN: 16.5 g/dL (ref 13.0–17.0)
MCH: 30.2 pg (ref 26.0–34.0)
MCHC: 37.7 g/dL — AB (ref 30.0–36.0)
MCV: 80.2 fL (ref 78.0–100.0)
Platelets: 197 10*3/uL (ref 150–400)
RBC: 5.46 MIL/uL (ref 4.22–5.81)
RDW: 12.4 % (ref 11.5–15.5)
WBC: 11.1 10*3/uL — ABNORMAL HIGH (ref 4.0–10.5)

## 2016-04-09 LAB — I-STAT TROPONIN, ED
TROPONIN I, POC: 0 ng/mL (ref 0.00–0.08)
Troponin i, poc: 0 ng/mL (ref 0.00–0.08)

## 2016-04-09 LAB — HEPATIC FUNCTION PANEL
ALK PHOS: 92 U/L (ref 38–126)
ALT: 19 U/L (ref 17–63)
AST: 19 U/L (ref 15–41)
Albumin: 4.3 g/dL (ref 3.5–5.0)
BILIRUBIN DIRECT: 0.3 mg/dL (ref 0.1–0.5)
BILIRUBIN TOTAL: 1.7 mg/dL — AB (ref 0.3–1.2)
Indirect Bilirubin: 1.4 mg/dL — ABNORMAL HIGH (ref 0.3–0.9)
Total Protein: 7.2 g/dL (ref 6.5–8.1)

## 2016-04-09 LAB — BRAIN NATRIURETIC PEPTIDE: B Natriuretic Peptide: 59.1 pg/mL (ref 0.0–100.0)

## 2016-04-09 LAB — BASIC METABOLIC PANEL
ANION GAP: 9 (ref 5–15)
BUN: 13 mg/dL (ref 6–20)
CALCIUM: 9 mg/dL (ref 8.9–10.3)
CO2: 30 mmol/L (ref 22–32)
Chloride: 87 mmol/L — ABNORMAL LOW (ref 101–111)
Creatinine, Ser: 1.12 mg/dL (ref 0.61–1.24)
Glucose, Bld: 114 mg/dL — ABNORMAL HIGH (ref 65–99)
Potassium: 3.4 mmol/L — ABNORMAL LOW (ref 3.5–5.1)
Sodium: 126 mmol/L — ABNORMAL LOW (ref 135–145)

## 2016-04-09 LAB — LIPASE, BLOOD: Lipase: 46 U/L (ref 11–51)

## 2016-04-09 MED ORDER — SUCRALFATE 1 GM/10ML PO SUSP: 1.0000 g | Freq: Three times a day (TID) | ORAL | 0 refills | Status: DC

## 2016-04-09 MED ORDER — SODIUM CHLORIDE 0.9 % IV SOLN
1000.0000 mL | Freq: Once | INTRAVENOUS | Status: AC
  Administered 2016-04-09: 1000 mL via INTRAVENOUS

## 2016-04-09 MED ORDER — SODIUM CHLORIDE 0.9 % IV SOLN: 1000.0000 mL | INTRAVENOUS | Status: DC

## 2016-04-09 MED ORDER — OMEPRAZOLE 40 MG PO CPDR: 40.0000 mg | DELAYED_RELEASE_CAPSULE | Freq: Two times a day (BID) | ORAL | 0 refills | Status: DC

## 2016-04-09 MED ORDER — OMEPRAZOLE 40 MG PO CPDR: 80.0000 mg | DELAYED_RELEASE_CAPSULE | Freq: Every day | ORAL | 0 refills | Status: DC

## 2016-04-09 MED ORDER — SUCRALFATE 1 GM/10ML PO SUSP
1.0000 g | Freq: Three times a day (TID) | ORAL | Status: DC
  Administered 2016-04-09: 1 g via ORAL
  Filled 2016-04-09: qty 10

## 2016-04-09 MED ORDER — PANTOPRAZOLE SODIUM 40 MG PO TBEC
80.0000 mg | DELAYED_RELEASE_TABLET | Freq: Every day | ORAL | Status: DC
  Administered 2016-04-09: 80 mg via ORAL
  Filled 2016-04-09: qty 2

## 2016-04-09 NOTE — ED Triage Notes (Signed)
Pt presents with c/o chest pain, burning in nature, for the last couple of weeks. Pt describes the pain as gas pain and says that it is generalized and goes through to his back. Pt is in NAD at this time, also c/o nausea and dizziness.

## 2016-04-09 NOTE — Discharge Instructions (Signed)
Your work up today was unremarkable.   Please take sucralfate suspension three times a day, omeprazole (40 mg) twice, and follow up with gastroenterologist for discussion of symptoms and consideration of further testings, as needed.  You have a stress test schedule on 04/11/16, please make sure you follow up with this appointment.   Return to emergency room if you develop chest pain associated with shortness of breath, nausea, vomiting

## 2016-04-09 NOTE — ED Provider Notes (Signed)
Pt seen and evlauted.  D/W PA C. Donney Rankins.  Pt describes CP as "burning", sour taste/bad breath.  Ha d difficulty and pain swallowing potatoes last nigh, with some regurgitation "like they stuck in my throat".  Also occasional "swimmy headedness".    EKG without changes. Neg trop x 2. Atypical CP. beter with carafate.  Has cardiology appt for "stress test in 3 days".  Agree with Dc. Recommend carafate liquid, increase to bid prilosec.  PRN antivert. GI follow up after GXT.  Pt GI is Dr. Fuller Plan. May need endoscopy for ?esophagitis/stricture.   Tanna Furry, MD 04/09/16 1440

## 2016-04-09 NOTE — ED Provider Notes (Signed)
[facility] DEPT Provider Note   CSN: [id number] Arrival date & time: 04/09/16  [id number]     History   Chief Complaint Chief Complaint  Patient presents with  . Chest Pain    HPI TREVAHN WEISMILLER is a 70 y.o. male with pmh of HTN, GERD, hyperlipidemia, CAD s/p CABG x 1 in 1994, gastric ulcer s/p Billroth I who presents with two concerns:  #throat and chest discomfort onset 2 weeks ago described as "burning", "bubbling" discomfort starting at back of throat , radiates to chest and R shoulder a/w bloating, inability to burp, nausea, foul taste in mouth and bad breath  pt reports difficulty swallowing sweet potatoes last night, felt like they were "stuck" in his throat pt compliant with omeprazole for reflux symptoms worse in the afternoon, but not affected by PO intake of fluids/solids or exertion pt denies alcohol abuse or intake, diarrhea, constipation, bloody stools, abdominal pain, fevers Remote h/o PUD s/p Billroth I  #dizziness Onset 3 weeks ago Pt noticed dizziness and "feeling like I was going to faint" while he was bowling.  Pt checked his BP and HR when he got home and his HR was in the 120s however his BP was normal Pt denies chest pain, sweating, nausea, vomiting associated with dizziness No aggravating or alleviating factors Pt has not been able to go on his daily 3 mi walk due to dizziness  Per chart review, pt was seen by cardiology 9 days ago with same symptoms (gas pain and dizziness).  At that time cardiologist found new T wave inversion in inferiolateral leads on EKG, scheduled treadmill nuclear stress test for atypical chest pain for 04/11/16 and started him on low dose carvedilol to control HR.  Pt also seen by PCP on 04/04/16 for similar symptoms, H.pylori negative on 04/04/16.  HPI  Past Medical History:  Diagnosis Date  . CAD (coronary artery disease)    cardiolite ok 3/05, stress test- low risk study 11/06  . Chronic low back pain    with radiculopathy   . ED (erectile dysfunction)   . Gallstones    abd [place]- stable hamangioma, gallbladder sludge and tiny stones 09/2003  . GERD (gastroesophageal reflux disease)   . Hyperlipidemia   . Hypertension   . Transfusion history     Patient Active Problem List   Diagnosis Date Noted  . Hyperglycemia 01/31/2016  . Need for hepatitis C screening test 01/25/2016  . Post-nasal drip 12/10/2015  . GERD (gastroesophageal reflux disease) 07/20/2015  . Abdominal cramps 07/20/2015  . Routine general medical examination at a health care facility 10/28/2014  . Elevated alkaline phosphatase level 10/28/2014  . Urinary frequency 04/29/2014  . BPH (benign prostatic hyperplasia) 04/29/2014  . Prostate cancer screening 08/11/2013  . Encounter for Medicare annual wellness exam 08/11/2013  . TRANSAMINASES, SERUM, ELEVATED 04/05/2010  . Hyperlipidemia 01/09/2007  . ERECTILE DYSFUNCTION 01/09/2007  . Essential hypertension 01/09/2007  . Coronary atherosclerosis 01/09/2007  . ESOPHAGITIS 01/09/2007    Past Surgical History:  Procedure Laterality Date  . CATARACT EXTRACTION W/PHACO  03/13/2012   Procedure: CATARACT EXTRACTION PHACO AND INTRAOCULAR LENS PLACEMENT (IOC);  Surgeon: Adonis Brook, MD;  Location: [facility];  Service: Ophthalmology;  Laterality: Left;  . CORONARY ARTERY BYPASS GRAFT  1998   x 1  done here at [facility]  . GASTRECTOMY     partial  . MEMBRANE PEEL  03/13/2012   Procedure: MEMBRANE PEEL;  Surgeon: Adonis Brook, MD;  Location: [facility];  Service: Ophthalmology;  Laterality: Left;  . PARS PLANA VITRECTOMY  03/13/2012   Procedure: PARS PLANA VITRECTOMY WITH 23 GAUGE;  Surgeon: Adonis Brook, MD;  Location: [facility];  Service: Ophthalmology;  Laterality: Left;       Home Medications    Prior to Admission medications   Medication Sig Start Date End Date Taking? Authorizing Provider  aspirin 325 MG EC tablet Take 325 mg by mouth daily.   Yes Historical Provider, MD  atorvastatin (LIPITOR) 10 MG  tablet TAKE 1 TABLET BY MOUTH EVERY DAY 01/11/16  Yes Abner Greenspan, MD  brimonidine (ALPHAGAN) 0.2 % ophthalmic solution INSTILL 1 DROP IN LEFT EYE TWICE DAILY 01/21/16  Yes Historical Provider, MD  carvedilol (COREG) 3.125 MG tablet Take 1 tablet (3.125 mg total) by mouth 2 (two) times daily with a meal. 03/31/16 06/29/16 Yes Almyra Deforest, PA  dorzolamide-timolol (COSOPT) 22.3-6.8 MG/ML ophthalmic solution Place 1 drop into the left eye 2 (two) times daily. Use as directed  05/07/13  Yes Historical Provider, MD  Lifitegrast Shirley Friar) 5 % SOLN Place 1 drop into both eyes 2 (two) times daily.   Yes Historical Provider, MD  tamsulosin (FLOMAX) 0.4 MG CAPS capsule TAKE 1 CAPSULE (0.4 MG TOTAL) BY MOUTH DAILY. 12/14/15  Yes Abner Greenspan, MD  valsartan-hydrochlorothiazide (DIOVAN-HCT) 160-25 MG tablet Take 1 tablet by mouth daily. 02/17/16  Yes Lorretta Harp, MD  dicyclomine (BENTYL) 10 MG capsule Take 1 capsule (10 mg total) by mouth 3 (three) times daily as needed for spasms. Patient not taking: Reported on 04/09/2016 07/21/15   Ladene Artist, MD  omeprazole (PRILOSEC) 40 MG capsule Take 1 capsule (40 mg total) by mouth 2 (two) times daily. 04/09/16 04/24/16  Kinnie Feil, PA-C  sucralfate (CARAFATE) 1 GM/10ML suspension Take 10 mLs (1 g total) by mouth 4 (four) times daily -  with meals and at bedtime. 04/09/16   Kinnie Feil, PA-C    Family History Family History  Problem Relation Age of Onset  . Cancer Sister     unknown type, pt feels alcohol related  . Cancer Brother     colon  . Cancer Brother     throat, pt feels alcohol related  . Colon cancer Brother     half brother  . Hypertension Mother     Social History Social History  Substance Use Topics  . Smoking status: Never Smoker  . Smokeless tobacco: Never Used  . Alcohol use No     Allergies   Patient has no known allergies.   Review of Systems Review of Systems  Constitutional: Negative for appetite change, fatigue  and fever.  HENT: Positive for trouble swallowing. Negative for sore throat and voice change.   Eyes: Negative for visual disturbance.  Respiratory: Positive for choking. Negative for cough and shortness of breath.   Cardiovascular: Positive for chest pain (burning chest discomfort). Negative for palpitations and leg swelling.  Gastrointestinal: Positive for abdominal pain and nausea. Negative for abdominal distention, anal bleeding, blood in stool, constipation, diarrhea, rectal pain and vomiting.  Genitourinary: Negative for difficulty urinating.  Musculoskeletal: Negative for back pain.  Skin: Negative for rash.  Neurological: Positive for dizziness. Negative for facial asymmetry, speech difficulty, weakness, light-headedness and headaches.     Physical Exam Updated Vital Signs BP 143/72   Pulse 69   Temp 97.6 F (36.4 C) (Oral)   Resp 18   Ht 5\' 8"  (1.727 m)   Wt 81.6 kg   SpO2 98%  BMI 27.37 kg/m   Physical Exam  Constitutional: He is oriented to person, place, and time. He appears well-developed and well-nourished. No distress.  HENT:  Head: Normocephalic and atraumatic.  Nose: Nose normal.  Mouth/Throat: Oropharynx is clear and moist. No oropharyngeal exudate.  Eyes: Conjunctivae and EOM are normal. Pupils are equal, round, and reactive to light.  Neck: Neck supple. No JVD present.  Cardiovascular: Normal rate, regular rhythm, normal heart sounds and intact distal pulses.   No murmur heard. Pulmonary/Chest: Effort normal and breath sounds normal. He has no wheezes.  Abdominal: Soft. He exhibits no distension and no mass. There is no tenderness (no epigastric tenderness). There is no rebound and no guarding. No hernia.  Musculoskeletal: Normal range of motion. He exhibits no edema (no lower extremity edema).  Lymphadenopathy:    He has no cervical adenopathy.  Neurological: He is alert and oriented to person, place, and time.  Pt is alert and oriented.  Speech and  phonation normal.  Thought process coherent.  Strength 5/5 in upper and lower extremities.  Sensation to light touch intact in upper and lower extremities. Intact finger to nose test.  No leg drift. CN I not tested CN II full visual fields  CN III, IV, VI PEERL and EOM intact CN V light touch intact in all 3 divisions of trigeminal nerve CN VII facial nerve movements intact, symmetric CN VIII hearing intact to finger rub CN IX, X no uvula deviation, symmetric soft palate rise CN XI 5/5 SCM and trapezius strength  CN XII Tongue midline with symmetric L/R movement  Skin: Skin is warm and dry. Capillary refill takes less than 2 seconds.  Psychiatric: He has a normal mood and affect.  Nursing note and vitals reviewed.    ED Treatments / Results  Labs (all labs ordered are listed, but only abnormal results are displayed) Labs Reviewed  BASIC METABOLIC PANEL - Abnormal; Notable for the following:       Result Value   Sodium 126 (*)    Potassium 3.4 (*)    Chloride 87 (*)    Glucose, Bld 114 (*)    All other components within normal limits  CBC - Abnormal; Notable for the following:    WBC 11.1 (*)    MCHC 37.7 (*)    All other components within normal limits  HEPATIC FUNCTION PANEL - Abnormal; Notable for the following:    Total Bilirubin 1.7 (*)    Indirect Bilirubin 1.4 (*)    All other components within normal limits  BRAIN NATRIURETIC PEPTIDE  LIPASE, BLOOD  I-STAT TROPOININ, ED  I-STAT TROPOININ, ED    EKG  EKG Interpretation  Date/Time:  Sunday April 09 2016 10:11:34 EST Ventricular Rate:  67 PR Interval:    QRS Duration: 95 QT Interval:  396 QTC Calculation: 418 R Axis:   75 Text Interpretation:  Sinus rhythm Atrial premature complexes New inverted t waves inferior/Lateral Confirmed by Jeneen Rinks  MD, [place] ([id number]) on 04/09/2016 10:26:06 AM       Radiology Dg Chest 2 View  Result Date: 04/09/2016 CLINICAL DATA:  Chest pain EXAM: CHEST  2 VIEW COMPARISON:   03/06/2012 FINDINGS: Prior CABG. Heart and mediastinal contours are within normal limits. No focal opacities or effusions. No acute bony abnormality. IMPRESSION: No active cardiopulmonary disease. Electronically Signed   By: Rolm Baptise M.D.   On: 04/09/2016 10:47    Procedures Procedures (including critical care time)  Medications Ordered in ED Medications  0.9 %  sodium chloride infusion (1,000 mLs Intravenous New Bag/Given 04/09/16 1332)    Followed by  0.9 %  sodium chloride infusion (not administered)  pantoprazole (PROTONIX) EC tablet 80 mg (80 mg Oral Given 04/09/16 1333)  sucralfate (CARAFATE) 1 GM/10ML suspension 1 g (1 g Oral Given 04/09/16 1333)     Initial Impression / Assessment and Plan / ED Course  I have reviewed the triage vital signs and the nursing notes.  Pertinent labs & imaging results that were available during my care of the patient were reviewed by me and considered in my medical decision making (see chart for details).  Clinical Course    70 yo male presents with dizziness and throat/chest burning discomfort.  Exam unremarkable.  Pt with VSS.  No epigastric tenderness.  No focal neuro deficits.  Pt denies dizziness while in ED. Initial ddx for atypical chest pain includes GERD, esophagitis, gastritis, PUD, ACS.  Dizziness could be cardiac related, less likely neuro related.  Doubt neuro emergency at this time including TIA or stroke.  Pt seen by cardiologist for same symptoms on 03/31/16.  At time of visit cardiologist started pt on carvedilol and scheduled a stress test on 04/11/16.  ED lab work up included troponin x 2, BNP, CBC, BMP, lipase, LFTs which were all grossly unremarkable.  CXR negative.  EKG showed new T wave inversions, which was also noted on cardiologist's EKG/note on 03/31/16.  Pt was given IVFs, sucralfate suspension, high dose omeprazole in ED. Pt continue to deny dizziness but reported improvement in throat/chest burning discomfort prior to  discharge after sucralfate and HD omeprazole.  Pt will be discharged with sucralfate, HD omeprazole, GI follow up for possible EGD.  Instructed pt to follow up with stress test on 04/11/16, pt verbalized understanding.  Pt discussed with Dr. Jeneen Rinks who evaluated pt and agreed with ED work up and dispo plan.  ED return instructions given, pt verbalized understanding and agreed with dispo plan.   Final Clinical Impressions(s) / ED Diagnoses   Final diagnoses:  Dizziness  Chest discomfort  Throat discomfort    New Prescriptions New Prescriptions   OMEPRAZOLE (PRILOSEC) 40 MG CAPSULE    Take 1 capsule (40 mg total) by mouth 2 (two) times daily.   SUCRALFATE (CARAFATE) 1 GM/10ML SUSPENSION    Take 10 mLs (1 g total) by mouth 4 (four) times daily -  with meals and at bedtime.     Kinnie Feil, PA-C 04/09/16 1458    Tanna Furry, MD 04/13/16 [phone]

## 2016-04-11 ENCOUNTER — Ambulatory Visit (HOSPITAL_COMMUNITY)
Admission: RE | Admit: 2016-04-11 | Discharge: 2016-04-11 | Disposition: A | Discharge status: Home / Self Care | Payer: Medicare Other | Source: Ambulatory Visit | Attending: Cardiology | Admitting: Cardiology

## 2016-04-11 DIAGNOSIS — R0789 Other chest pain: Secondary | ICD-10-CM

## 2016-04-11 LAB — MYOCARDIAL PERFUSION IMAGING
CHL CUP NUCLEAR SRS: 0
CHL CUP NUCLEAR SSS: 0
CHL CUP RESTING HR STRESS: 67 {beats}/min
CHL CUP STRESS STAGE 1 GRADE: 0 %
CHL CUP STRESS STAGE 1 HR: 80 {beats}/min
CHL CUP STRESS STAGE 2 SPEED: 0 mph
CHL CUP STRESS STAGE 3 GRADE: 10 %
CHL CUP STRESS STAGE 3 HR: 108 {beats}/min
CHL CUP STRESS STAGE 4 DBP: 80 mmHg
CHL CUP STRESS STAGE 4 SBP: 189 mmHg
CHL CUP STRESS STAGE 6 SPEED: 0 mph
CHL CUP STRESS STAGE 7 GRADE: 0 %
CHL CUP STRESS STAGE 7 SBP: 201 mmHg
CHL CUP STRESS STAGE 7 SPEED: 0 mph
CHL RATE OF PERCEIVED EXERTION: 17
CSEPED: 6 min
CSEPEDS: 30 s
CSEPEW: 7.7 METS
CSEPHR: 101 %
LV sys vol: 30 mL
LVDIAVOL: 83 mL (ref 62–150)
MPHR: 148 {beats}/min
Peak HR: 150 {beats}/min
Percent of predicted max HR: 101 %
SDS: 0
Stage 1 DBP: 92 mmHg
Stage 1 SBP: 173 mmHg
Stage 1 Speed: 0 mph
Stage 2 Grade: 0 %
Stage 2 HR: 79 {beats}/min
Stage 3 DBP: 74 mmHg
Stage 3 SBP: 141 mmHg
Stage 3 Speed: 1.7 mph
Stage 4 Grade: 12 %
Stage 4 HR: 137 {beats}/min
Stage 4 Speed: 2.5 mph
Stage 5 Grade: 14 %
Stage 5 HR: 150 {beats}/min
Stage 5 Speed: 3.4 mph
Stage 6 DBP: 63 mmHg
Stage 6 Grade: 0 %
Stage 6 HR: 122 {beats}/min
Stage 6 SBP: 209 mmHg
Stage 7 DBP: 80 mmHg
Stage 7 HR: 77 {beats}/min
TID: 0.85

## 2016-04-11 MED ORDER — TECHNETIUM TC 99M TETROFOSMIN IV KIT
32.0000 | PACK | Freq: Once | INTRAVENOUS | Status: AC | PRN
  Administered 2016-04-11: 32 via INTRAVENOUS
  Filled 2016-04-11: qty 32

## 2016-04-11 MED ORDER — TECHNETIUM TC 99M TETROFOSMIN IV KIT
10.1000 | PACK | Freq: Once | INTRAVENOUS | Status: AC | PRN
  Administered 2016-04-11: 10.1 via INTRAVENOUS
  Filled 2016-04-11: qty 11

## 2016-04-12 ENCOUNTER — Telehealth: Payer: Self-pay | Admitting: Gastroenterology

## 2016-04-12 ENCOUNTER — Encounter: Payer: Self-pay | Admitting: Gastroenterology

## 2016-04-12 ENCOUNTER — Ambulatory Visit (INDEPENDENT_AMBULATORY_CARE_PROVIDER_SITE_OTHER): Payer: Medicare Other | Admitting: Gastroenterology

## 2016-04-12 VITALS — BP 126/72 | HR 68 | Ht 68.0 in | Wt 176.8 lb

## 2016-04-12 DIAGNOSIS — R1314 Dysphagia, pharyngoesophageal phase: Secondary | ICD-10-CM

## 2016-04-12 DIAGNOSIS — K648 Other hemorrhoids: Secondary | ICD-10-CM

## 2016-04-12 DIAGNOSIS — K5900 Constipation, unspecified: Secondary | ICD-10-CM | POA: Diagnosis not present

## 2016-04-12 NOTE — Progress Notes (Signed)
    History of Present Illness: This is a 70 year old male with difficulty swallowing, epigastric pain and constipation. He is accompanied by his wife. He was seen in [facility] ED on 12/17 and I reviewed those records. He has had 3 weeks of solid food dysphagia. He swallows liquids without difficulty. Epigastric pain has improved since starting omeprazole and Carafate prescribed at [facility] ED. He has had mild constipation and straining associated with occasional rectal itching. Colonoscopy and EGD as below.  Colonoscopy 01/2016 - One 4 mm polyp in the cecum, removed with a cold biopsy forceps. Resected and retrieved. - Internal hemorrhoids. - The examination was otherwise normal on direct and retroflexion views.  EGD 1998 Bilroth I Mild gastritis   Current Medications, Allergies, Past Medical History, Past Surgical History, Family History and Social History were reviewed in [employer] record.  Physical Exam: General: Well developed, well nourished, no acute distress Head: Normocephalic and atraumatic Eyes:  sclerae anicteric, EOMI Ears: Normal auditory acuity Mouth: No deformity or lesions Lungs: Clear throughout to auscultation Heart: Regular rate and rhythm; no murmurs, rubs or bruits Abdomen: Soft, non tender and non distended. No masses, hepatosplenomegaly or hernias noted. Normal Bowel sounds Musculoskeletal: Symmetrical with no gross deformities  Pulses:  Normal pulses noted Extremities: No clubbing, cyanosis, edema or deformities noted Neurological: Alert oriented x 4, grossly nonfocal Psychological:  Alert and cooperative. Normal mood and affect  Assessment and Recommendations:  1. Dysphagia to solid foods. Presumed GERD. Rule out stricture, esophagitis, neoplasm. Continue omeprazole 40 mg twice a day and Carafate suspension 1 g 4 times a day for now. Soft foods only for now. Schedule barium esophagram and EGD. The risks (including bleeding, perforation,  infection, missed lesions, medication reactions and possible hospitalization or surgery if complications occur), benefits, and alternatives to endoscopy with possible biopsy and possible dilation were discussed with the patient and they consent to proceed.   2. Mild constipation and rectal itching. Begin MiraLAX once daily and follow standard rectal care instructions for hemorrhoidal symptoms.

## 2016-04-12 NOTE — Patient Instructions (Signed)
Start Miralax over the counter mixing 17 grams in 8 oz of water daily.  RECTAL CARE INSTRUCTIONS:  1. Sitz Baths twice a day for 10 minutes each. 2. Thoroughly clean and dry the rectum. 3. Put Tucks pad against the rectum at night. 4. Clean the rectum with Balenol lotion after each bowel movement.     You have been scheduled for a Barium Esophogram at [facility] (1st floor of the hospital) on 04/14/16 at 2:00pm. Please arrive 15 minutes prior to your appointment for registration. Make certain not to have anything to eat or drink 6 hours prior to your test. If you need to reschedule for any reason, please contact radiology at [phone] to do so. __________________________________________________________________ A barium swallow is an examination that concentrates on views of the esophagus. This tends to be a double contrast exam (barium and two liquids which, when combined, create a gas to distend the wall of the oesophagus) or single contrast (non-ionic iodine based). The study is usually tailored to your symptoms so a good history is essential. Attention is paid during the study to the form, structure and configuration of the esophagus, looking for functional disorders (such as aspiration, dysphagia, achalasia, motility and reflux) EXAMINATION You may be asked to change into a gown, depending on the type of swallow being performed. A radiologist and radiographer will perform the procedure. The radiologist will advise you of the type of contrast selected for your procedure and direct you during the exam. You will be asked to stand, sit or lie in several different positions and to hold a small amount of fluid in your mouth before being asked to swallow while the imaging is performed .In some instances you may be asked to swallow barium coated marshmallows to assess the motility of a solid food bolus. The exam can be recorded as a digital or video fluoroscopy procedure. POST  PROCEDURE It will take 1-2 days for the barium to pass through your system. To facilitate this, it is important, unless otherwise directed, to increase your fluids for the next 24-48hrs and to resume your normal diet.  This test typically takes about 30 minutes to perform. _______________________________________________________________________  Jerry Farrell have been scheduled for an endoscopy. Please follow written instructions given to you at your visit today. If you use inhalers (even only as needed), please bring them with you on the day of your procedure. Your physician has requested that you go to www.startemmi.com and enter the access code given to you at your visit today. This web site gives a general overview about your procedure. However, you should still follow specific instructions given to you by our office regarding your preparation for the procedure.  Thank you for choosing me and [facility].  Pricilla Riffle. Dagoberto Ligas., MD., [facility]

## 2016-04-12 NOTE — Telephone Encounter (Signed)
Pt states he was seen in the ER the other day for epigastric pain. States he was evaluated and it wan not cardiac related. Pt requesting to be seen. Pt scheduled today at 10:45am with Dr. Fuller Plan. Pt aware of appt.

## 2016-04-14 ENCOUNTER — Emergency Department (HOSPITAL_COMMUNITY)
Admission: EM | Admit: 2016-04-14 | Discharge: 2016-04-14 | Discharge status: Absent without leave | Payer: Medicare Other | Source: Home / Self Care

## 2016-04-14 ENCOUNTER — Ambulatory Visit (HOSPITAL_COMMUNITY): Payer: Medicare Other

## 2016-04-14 ENCOUNTER — Ambulatory Visit (HOSPITAL_COMMUNITY)
Admission: RE | Admit: 2016-04-14 | Discharge: 2016-04-14 | Disposition: A | Discharge status: Home / Self Care | Payer: Medicare Other | Source: Ambulatory Visit | Attending: Gastroenterology | Admitting: Gastroenterology

## 2016-04-14 DIAGNOSIS — R1314 Dysphagia, pharyngoesophageal phase: Secondary | ICD-10-CM

## 2016-04-14 DIAGNOSIS — R1310 Dysphagia, unspecified: Secondary | ICD-10-CM | POA: Diagnosis not present

## 2016-04-15 ENCOUNTER — Other Ambulatory Visit: Payer: Self-pay | Admitting: Family Medicine

## 2016-04-28 DIAGNOSIS — H16223 Keratoconjunctivitis sicca, not specified as Sjogren's, bilateral: Secondary | ICD-10-CM | POA: Diagnosis not present

## 2016-04-28 DIAGNOSIS — H04123 Dry eye syndrome of bilateral lacrimal glands: Secondary | ICD-10-CM | POA: Diagnosis not present

## 2016-05-15 NOTE — Addendum Note (Signed)
Addended by: Waylan Rocher on: 05/15/2016 08:53 AM   Modules accepted: Orders

## 2016-05-19 ENCOUNTER — Encounter: Payer: Self-pay | Admitting: Gastroenterology

## 2016-05-28 ENCOUNTER — Other Ambulatory Visit: Payer: Self-pay | Admitting: Family Medicine

## 2016-06-01 ENCOUNTER — Encounter: Payer: Self-pay | Admitting: Gastroenterology

## 2016-06-01 ENCOUNTER — Ambulatory Visit (AMBULATORY_SURGERY_CENTER): Payer: Medicare Other | Admitting: Gastroenterology

## 2016-06-01 VITALS — BP 120/67 | HR 59 | Temp 96.6°F | Resp 17 | Ht 68.0 in | Wt 176.0 lb

## 2016-06-01 DIAGNOSIS — K219 Gastro-esophageal reflux disease without esophagitis: Secondary | ICD-10-CM | POA: Diagnosis not present

## 2016-06-01 DIAGNOSIS — R1314 Dysphagia, pharyngoesophageal phase: Secondary | ICD-10-CM

## 2016-06-01 DIAGNOSIS — B9681 Helicobacter pylori [H. pylori] as the cause of diseases classified elsewhere: Secondary | ICD-10-CM | POA: Diagnosis not present

## 2016-06-01 DIAGNOSIS — K2950 Unspecified chronic gastritis without bleeding: Secondary | ICD-10-CM | POA: Diagnosis not present

## 2016-06-01 DIAGNOSIS — R1310 Dysphagia, unspecified: Secondary | ICD-10-CM | POA: Diagnosis not present

## 2016-06-01 MED ORDER — SODIUM CHLORIDE 0.9 % IV SOLN: 500.0000 mL | INTRAVENOUS | Status: DC

## 2016-06-01 NOTE — Progress Notes (Signed)
A and O x3. Report to RN. Tolerated MAC anesthesia well.Teeth unchanged after procedure.

## 2016-06-01 NOTE — Progress Notes (Signed)
Pt's states no medical or surgical changes since previsit or office visit. 

## 2016-06-01 NOTE — Op Note (Signed)
[facility] Patient Name: [facility] Procedure Date: 06/01/2016 9:15 AM MRN: [id number] Endoscopist: Ladene Artist , MD Age: 71 Referring MD:  Date of Birth: 1945-06-11 Gender: Male Account #: [id number] Procedure:                Upper GI endoscopy Indications:              Dysphagia, Suspected gastro-esophageal reflux                            disease Medicines:                Monitored Anesthesia Care Procedure:                Pre-Anesthesia Assessment:                           - Prior to the procedure, a History and Physical                            was performed, and patient medications and                            allergies were reviewed. The patient's tolerance of                            previous anesthesia was also reviewed. The risks                            and benefits of the procedure and the sedation                            options and risks were discussed with the patient.                            All questions were answered, and informed consent                            was obtained. Prior Anticoagulants: The patient has                            taken no previous anticoagulant or antiplatelet                            agents. ASA Grade Assessment: II - A patient with                            mild systemic disease. After reviewing the risks                            and benefits, the patient was deemed in                            satisfactory condition to undergo the procedure.  After obtaining informed consent, the endoscope was                            passed under direct vision. Throughout the                            procedure, the patient's blood pressure, pulse, and                            oxygen saturations were monitored continuously. The                            Model GIF-HQ190 [id number]) scope was introduced                            through the mouth, and advanced to the second part                           of duodenum. The upper GI endoscopy was                            accomplished without difficulty. The patient                            tolerated the procedure well. Scope In: Scope Out: Findings:                 No endoscopic abnormality was evident in the                            esophagus to explain the patient's complaint of                            dysphagia. It was decided, however, to proceed with                            dilation of the entire esophagus. A guidewire was                            placed and the scope was withdrawn. Dilation was                            performed with a Savary dilator with no resistance                            at 16 mm. No heme.                           Evidence of a patent Billroth I gastroduodenostomy                            was found. A gastric pouch with a normal size was  found. The gastroduodenal anastomosis was                            characterized by mild erythema. This was traversed.                           Diffuse mild inflammation characterized by erythema                            and granularity was found in the gastric body.                            Biopsies were taken with a cold forceps for                            histology.                           The second portion of the duodenum was normal. Complications:            No immediate complications. Estimated Blood Loss:     Estimated blood loss was minimal. Impression:               - No endoscopic esophageal abnormality to explain                            patient's dysphagia. Esophagus dilated.                           - Patent Billroth I gastroduodenostomy was found,                            characterized by erythema.                           - Gastritis. Biopsied.                           - Normal second portion of the duodenum. Recommendation:           - Patient has a contact number  available for                            emergencies. The signs and symptoms of potential                            delayed complications were discussed with the                            patient. Return to normal activities tomorrow.                            Written discharge instructions were provided to the                            patient.                           -  Clear liquid diet for 2 hours, then advance as                            tolerated to soft diet today. Resume prior diet                            tomorrow.                           - Continue present medications.                           - Await pathology results. Ladene Artist, MD 06/01/2016 9:58:18 AM This report has been signed electronically.

## 2016-06-01 NOTE — Progress Notes (Signed)
Called to room to assist during endoscopic procedure.  Patient ID and intended procedure confirmed with present staff. Received instructions for my participation in the procedure from the performing physician.  

## 2016-06-01 NOTE — Patient Instructions (Signed)
HANDOUTS GIVEN: POST DILATATION DIET AND GASTRITIS.   YOU HAD AN ENDOSCOPIC PROCEDURE TODAY AT [facility]:   Refer to the procedure report that was given to you for any specific questions about what was found during the examination.  If the procedure report does not answer your questions, please call your gastroenterologist to clarify.  If you requested that your care partner not be given the details of your procedure findings, then the procedure report has been included in a sealed envelope for you to review at your convenience later.  YOU SHOULD EXPECT: Some feelings of bloating in the abdomen. Passage of more gas than usual.  Walking can help get rid of the air that was put into your GI tract during the procedure and reduce the bloating. If you had a lower endoscopy (such as a colonoscopy or flexible sigmoidoscopy) you may notice spotting of blood in your stool or on the toilet paper. If you underwent a bowel prep for your procedure, you may not have a normal bowel movement for a few days.  Please Note:  You might notice some irritation and congestion in your nose or some drainage.  This is from the oxygen used during your procedure.  There is no need for concern and it should clear up in a day or so.  SYMPTOMS TO REPORT IMMEDIATELY:    Following upper endoscopy (EGD)  Vomiting of blood or coffee ground material  New chest pain or pain under the shoulder blades  Painful or persistently difficult swallowing  New shortness of breath  Fever of 100F or higher  Black, tarry-looking stools  For urgent or emergent issues, a gastroenterologist can be reached at any hour by calling [phone].   DIET:  CLEAR LIQUIDS FOR TWO HOURS, UNTIL 12:00. AFTER 12:00 ONLY SOFT FOODS UNTIL THE MORNING. RESUME YOUR REGULAR DIET IN THE AM.   ACTIVITY:  You should plan to take it easy for the rest of today and you should NOT DRIVE or use heavy machinery until tomorrow (because of the  sedation medicines used during the test).    FOLLOW UP: Our staff will call the number listed on your records the next business day following your procedure to check on you and address any questions or concerns that you may have regarding the information given to you following your procedure. If we do not reach you, we will leave a message.  However, if you are feeling well and you are not experiencing any problems, there is no need to return our call.  We will assume that you have returned to your regular daily activities without incident.  If any biopsies were taken you will be contacted by phone or by letter within the next 1-3 weeks.  Please call us at [phone] if you have not heard about the biopsies in 3 weeks.    SIGNATURES/CONFIDENTIALITY: You and/or your care partner have signed paperwork which will be entered into your electronic medical record.  These signatures attest to the fact that that the information above on your After Visit Summary has been reviewed and is understood.  Full responsibility of the confidentiality of this discharge information lies with you and/or your care-partner.

## 2016-06-02 ENCOUNTER — Telehealth: Payer: Self-pay | Admitting: *Deleted

## 2016-06-02 ENCOUNTER — Ambulatory Visit (INDEPENDENT_AMBULATORY_CARE_PROVIDER_SITE_OTHER): Payer: Medicare Other | Admitting: Physician Assistant

## 2016-06-02 ENCOUNTER — Encounter: Payer: Self-pay | Admitting: Physician Assistant

## 2016-06-02 VITALS — BP 131/80 | HR 62 | Ht 68.0 in | Wt 179.6 lb

## 2016-06-02 DIAGNOSIS — I10 Essential (primary) hypertension: Secondary | ICD-10-CM

## 2016-06-02 DIAGNOSIS — R1310 Dysphagia, unspecified: Secondary | ICD-10-CM

## 2016-06-02 DIAGNOSIS — I25810 Atherosclerosis of coronary artery bypass graft(s) without angina pectoris: Secondary | ICD-10-CM | POA: Diagnosis not present

## 2016-06-02 DIAGNOSIS — R42 Dizziness and giddiness: Secondary | ICD-10-CM

## 2016-06-02 DIAGNOSIS — E785 Hyperlipidemia, unspecified: Secondary | ICD-10-CM

## 2016-06-02 MED ORDER — ASPIRIN EC 81 MG PO TBEC: 81.0000 mg | DELAYED_RELEASE_TABLET | Freq: Every day | ORAL | 3 refills | Status: DC

## 2016-06-02 NOTE — Progress Notes (Signed)
Cardiology Office Note    Date:  06/02/2016   ID:  Farrell, Jerry 11-08-1945, MRN [id number]  PCP:  [name], MD  Cardiologist:  Dr. Gwenlyn Found   Chief Complaint  Patient presents with  . Follow-up    seen for Dr. Gwenlyn Found, h/o dizziness, underwent stress testing     History of Present Illness:  Jerry Farrell is a 71 y.o. male with PMH of CAD s/p CABG x 1 in 1994, HTN and HLD. There was some suspicion that his bypass surgery in 1994 was for left main disease, however no cath report was available. He also has a brother who recently underwent CABG as well. He was lost to follow-up for many years and was recently reestablished with cardiology service. On his last follow-up with Dr. Gwenlyn Found on 01/19/2016, his blood pressure was elevated in the [id number] systolic. He was referred to the hypertension clinic for adjustment of blood pressure medications. His lipid seems to be well-controlled based on the previous lab and is followed by his PCP. He was doing well on atenolol, Diovan, HCTZ. However due to shortage of atenolol, he was switched to metoprolol. On initial office visit with hypertension clinic, his metoprolol was discontinued and the valsartan HCTZ was increased to 160/25. His last office visit was on 03/13/2016, his blood pressure remained elevated in the office, however his home measurement was at goal. His home blood pressure cuff measures accurately in the office as well. It was felt that he has a touch of whitecoat hypertension. Therefore no further adjustment was made.  I last saw the patient on 03/31/2016, he was complaining episodic dizziness. He also had intermittent "gas pain". It was not related to exertion but improved with burping. His EKG however showed new T wave inversion in the inferolateral leads that was not on the previous EKG. He underwent Myoview on 12/90/2070, the perfusion portion was normal without signs of ischemia, he did have ST segment depression up to 4 mm noted in lead 2,  3, aVF, V4-V6, overall was consider a low risk study. The ST segment changes may have represented repolarization abnormality given normal perfusion. Medical therapy was pursued. Since the Myoview, he had been evaluated by GI. He had an negative esophagram on 04/14/2016. He eventually underwent EGD yesterday on 06/01/2016, no clear etiology of dysphagia was filed, although he did undergo dilatation of the entire esophagus. He also had mild inflammation in the gastric body as well. There was evidence of previous Billroth I gastric bypass surgery.  He presented for cardiology office visit, he denies any recent chest discomfort. He did have one episode of dizziness which occurred when he bent over and got up too quickly. Otherwise he has been doing well without any recent fever, chill, or cough. His blood pressure is very well controlled. I would not recommend any further adjustment. He can follow-up with Dr. Gwenlyn Found in six-month.    Past Medical History:  Diagnosis Date  . CAD (coronary artery disease)    cardiolite ok 3/05, stress test- low risk study 11/06  . Chronic low back pain    with radiculopathy  . ED (erectile dysfunction)   . Gallstones    abd [place]- stable hamangioma, gallbladder sludge and tiny stones 09/2003  . GERD (gastroesophageal reflux disease)   . Hyperlipidemia   . Hypertension   . Transfusion history     Past Surgical History:  Procedure Laterality Date  . CATARACT EXTRACTION W/PHACO  03/13/2012   Procedure: CATARACT EXTRACTION  PHACO AND INTRAOCULAR LENS PLACEMENT (IOC);  Surgeon: Adonis Brook, MD;  Location: [facility];  Service: Ophthalmology;  Laterality: Left;  . CORONARY ARTERY BYPASS GRAFT  1998   x 1  [facility]  . GASTRECTOMY     partial  . MEMBRANE PEEL  03/13/2012   Procedure: MEMBRANE PEEL;  Surgeon: Adonis Brook, MD;  Location: [facility];  Service: Ophthalmology;  Laterality: Left;  . PARS PLANA VITRECTOMY  03/13/2012   Procedure: PARS PLANA VITRECTOMY WITH 23  GAUGE;  Surgeon: Adonis Brook, MD;  Location: [facility];  Service: Ophthalmology;  Laterality: Left;    Current Medications: Outpatient Medications Prior to Visit  Medication Sig Dispense Refill  . atorvastatin (LIPITOR) 10 MG tablet TAKE 1 TABLET BY MOUTH EVERY DAY 90 tablet 2  . brimonidine (ALPHAGAN) 0.2 % ophthalmic solution INSTILL 1 DROP IN LEFT EYE TWICE DAILY  0  . carvedilol (COREG) 3.125 MG tablet Take 1 tablet (3.125 mg total) by mouth 2 (two) times daily with a meal. 180 tablet 3  . dorzolamide-timolol (COSOPT) 22.3-6.8 MG/ML ophthalmic solution Place 1 drop into the left eye 2 (two) times daily. Use as directed     . Lifitegrast (XIIDRA) 5 % SOLN Place 1 drop into both eyes 2 (two) times daily.    . tamsulosin (FLOMAX) 0.4 MG CAPS capsule TAKE 1 CAPSULE (0.4 MG TOTAL) BY MOUTH DAILY. 90 capsule 1  . valsartan-hydrochlorothiazide (DIOVAN-HCT) 160-25 MG tablet Take 1 tablet by mouth daily. 30 tablet 5  . aspirin 325 MG EC tablet Take 81 mg by mouth daily.    Marland Kitchen omeprazole (PRILOSEC) 40 MG capsule Take 1 capsule (40 mg total) by mouth 2 (two) times daily. 30 capsule 0  . dicyclomine (BENTYL) 10 MG capsule Take 1 capsule (10 mg total) by mouth 3 (three) times daily as needed for spasms. 90 capsule 11  . sucralfate (CARAFATE) 1 GM/10ML suspension Take 10 mLs (1 g total) by mouth 4 (four) times daily -  with meals and at bedtime. 420 mL 0   Facility-Administered Medications Prior to Visit  Medication Dose Route Frequency Provider Last Rate Last Dose  . 0.9 %  sodium chloride infusion  500 mL Intravenous Continuous Ladene Artist, MD      . 0.9 %  sodium chloride infusion  500 mL Intravenous Continuous Ladene Artist, MD         Allergies:   Patient has no known allergies.   Social History   Social History  . Marital status: Married    Spouse name: N/A  . Number of children: 3  . Years of education: N/A   Occupational History  . [employer] Retired   Social History Main Topics    . Smoking status: Never Smoker  . Smokeless tobacco: Never Used  . Alcohol use No  . Drug use: No  . Sexual activity: Yes   Other Topics Concern  . None   Social History Narrative  . None     Family History:  The patient's family history includes Cancer in his brother, brother, and sister; Colon cancer in his brother; Hypertension in his mother.   ROS:   Please see the history of present illness.    ROS All other systems reviewed and are negative.   PHYSICAL EXAM:   VS:  BP 131/80   Pulse 62   Ht 5\' 8"  (1.727 m)   Wt 179 lb 9.6 oz (81.5 kg)   BMI 27.31 kg/m  GEN: Well nourished, well developed, in no acute distress  HEENT: normal  Neck: no JVD, carotid bruits, or masses Cardiac: RRR; no murmurs, rubs, or gallops,no edema  Respiratory:  clear to auscultation bilaterally, normal work of breathing GI: soft, nontender, nondistended, + BS MS: no deformity or atrophy  Skin: warm and dry, no rash Neuro:  Alert and Oriented x 3, Strength and sensation are intact Psych: euthymic mood, full affect  Wt Readings from Last 3 Encounters:  06/02/16 179 lb 9.6 oz (81.5 kg)  06/01/16 176 lb (79.8 kg)  04/12/16 176 lb 12.8 oz (80.2 kg)      Studies/Labs Reviewed:   EKG:  EKG is not ordered today.   Recent Labs: 01/26/2016: TSH 1.16 04/09/2016: ALT 19; B Natriuretic Peptide 59.1; BUN 13; Creatinine, Ser 1.12; Hemoglobin 16.5; Platelets 197; Potassium 3.4; Sodium 126   Lipid Panel    Component Value Date/Time   CHOL 156 01/26/2016 0925   TRIG 191.0 (H) 01/26/2016 0925   HDL 51.10 01/26/2016 0925   CHOLHDL 3 01/26/2016 0925   VLDL 38.2 01/26/2016 0925   LDLCALC 67 01/26/2016 0925    Additional studies/ records that were reviewed today include:   Myoview 04/11/2016 Study Highlights     The left ventricular ejection fraction is normal (55-65%).  Nuclear stress EF: 63%.  Blood pressure demonstrated a hypertensive response to exercise.  Horizontal ST segment  depression ST segment depression of 4 mm was noted during stress in the II, III, aVF, V6, V5 and V4 leads, and returning to baseline after 1-5 minutes of recovery.  This is a low risk study.   Normal perfusion. Mildly reduced exercise capacity with hypertensive response to exercise. Significant horizontal ST depression of 4 mm inferolaterally in the setting of LVH - may represent repolarization abnormality given normal perfusion. LVEF 63% with normal wall motion. This is a low risk study     EGD 06/01/2016 - No endoscopic esophageal abnormality to explain patient's dysphagia. Esophagus dilated. - Patent Billroth I gastroduodenostomy was found, characterized by erythema. - Gastritis. Biopsied. - Normal second portion of the duodenum.    ASSESSMENT:    1. Dizziness   2. Coronary artery disease involving coronary bypass graft of native heart without angina pectoris   3. Essential hypertension   4. Hyperlipidemia, unspecified hyperlipidemia type   5. Dysphagia, unspecified type      PLAN:  In order of problems listed above:  1. Dizziness: His dizziness has improved, he does occasionally still have dizziness when he bent over. However there is no exertional component. Given negative stress test in December, I would not recommend any further workup.  2. CAD s/p CABG: No exertional chest discomfort. Reassuring Myoview recently.  3. Hypertension: I added low-dose carvedilol to his medical regimen during last visit, his blood pressure has been very well controlled. Since he has been having dizziness when he bent over, I would not try to control his blood pressure too much. His blood pressure today is 131/80 which I think is a very good level for him.  4. Hyperlipidemia: Continue Lipitor 10 mg daily.  5. Dysphagia: Underwent EGD yesterday, did have some mild inflammation in the gastric body underwent biopsy. He did undergo full length esophagus dilatation, however did not notice any obvious  culprit for dysphagia. Further management per GI.    Medication Adjustments/Labs and Tests Ordered: Current medicines are reviewed at length with the patient today.  Concerns regarding medicines are outlined above.  Medication changes, Labs and  Tests ordered today are listed in the Patient Instructions below. Patient Instructions  Medication Instructions:  Your physician recommends that you continue on your current medications as directed. Please refer to the Current Medication list given to you today.   If you need a refill on your cardiac medications before your next appointment, please call your pharmacy.  Labwork: NONE  Follow-Up: Your physician wants you to follow-up in: [place] (AUGUST 2018) You will receive a reminder letter in the mail two months in advance. If you don't receive a letter, please call our office IN June to schedule this follow-up appointment.   Thank you for choosing [facility] at [facility]!!    Magdeline Prange, PA-C  [place], LPN     Signed, Almyra Deforest, [place]  06/02/2016 9:48 AM    [facility] [address], [place], [place]  [address] Phone: [phone]; Fax: [phone]

## 2016-06-02 NOTE — Patient Instructions (Signed)
Medication Instructions:  Your physician recommends that you continue on your current medications as directed. Please refer to the Current Medication list given to you today.   If you need a refill on your cardiac medications before your next appointment, please call your pharmacy.  Labwork: NONE  Follow-Up: Your physician wants you to follow-up in: [place] (AUGUST 2018) You will receive a reminder letter in the mail two months in advance. If you don't receive a letter, please call our office IN June to schedule this follow-up appointment.   Thank you for choosing [facility] at [place]!!    HAO MENG, PA-C  [place], LPN

## 2016-06-02 NOTE — Telephone Encounter (Signed)
No answer, unable to leave message °

## 2016-06-05 ENCOUNTER — Telehealth: Payer: Self-pay | Admitting: *Deleted

## 2016-06-05 NOTE — Telephone Encounter (Signed)
  Follow up Call-  Call back number 06/01/2016 02/15/2016  Post procedure Call Back phone  # [phone]  Permission to leave phone message Yes Yes  Some recent data might be hidden     Patient questions:  Do you have a fever, pain , or abdominal swelling? No. Pain Score  0 *  Have you tolerated food without any problems? Yes.    Have you been able to return to your normal activities? Yes.    Do you have any questions about your discharge instructions: Diet   No. Medications  No. Follow up visit  No.  Do you have questions or concerns about your Care? No.  Actions: * If pain score is 4 or above: No action needed, pain <4.

## 2016-06-06 ENCOUNTER — Inpatient Hospital Stay (HOSPITAL_COMMUNITY)
Admission: EM | Admit: 2016-06-06 | Discharge: 2016-06-09 | DRG: 312 | Disposition: A | Discharge status: Home / Self Care | Payer: Medicare Other | Attending: Internal Medicine | Admitting: Internal Medicine

## 2016-06-06 ENCOUNTER — Encounter (HOSPITAL_COMMUNITY): Payer: Self-pay

## 2016-06-06 DIAGNOSIS — N400 Enlarged prostate without lower urinary tract symptoms: Secondary | ICD-10-CM | POA: Diagnosis not present

## 2016-06-06 DIAGNOSIS — I951 Orthostatic hypotension: Secondary | ICD-10-CM | POA: Diagnosis not present

## 2016-06-06 DIAGNOSIS — K219 Gastro-esophageal reflux disease without esophagitis: Secondary | ICD-10-CM | POA: Diagnosis present

## 2016-06-06 DIAGNOSIS — K921 Melena: Secondary | ICD-10-CM | POA: Diagnosis not present

## 2016-06-06 DIAGNOSIS — Z79899 Other long term (current) drug therapy: Secondary | ICD-10-CM

## 2016-06-06 DIAGNOSIS — D649 Anemia, unspecified: Secondary | ICD-10-CM | POA: Diagnosis not present

## 2016-06-06 DIAGNOSIS — Z961 Presence of intraocular lens: Secondary | ICD-10-CM | POA: Diagnosis present

## 2016-06-06 DIAGNOSIS — Z9842 Cataract extraction status, left eye: Secondary | ICD-10-CM

## 2016-06-06 DIAGNOSIS — I10 Essential (primary) hypertension: Secondary | ICD-10-CM | POA: Diagnosis not present

## 2016-06-06 DIAGNOSIS — R002 Palpitations: Secondary | ICD-10-CM | POA: Diagnosis present

## 2016-06-06 DIAGNOSIS — E785 Hyperlipidemia, unspecified: Secondary | ICD-10-CM | POA: Diagnosis present

## 2016-06-06 DIAGNOSIS — K922 Gastrointestinal hemorrhage, unspecified: Secondary | ICD-10-CM | POA: Diagnosis not present

## 2016-06-06 DIAGNOSIS — K8020 Calculus of gallbladder without cholecystitis without obstruction: Secondary | ICD-10-CM | POA: Diagnosis not present

## 2016-06-06 DIAGNOSIS — K250 Acute gastric ulcer with hemorrhage: Secondary | ICD-10-CM | POA: Diagnosis not present

## 2016-06-06 DIAGNOSIS — K2950 Unspecified chronic gastritis without bleeding: Secondary | ICD-10-CM

## 2016-06-06 DIAGNOSIS — Z86010 Personal history of colonic polyps: Secondary | ICD-10-CM | POA: Diagnosis not present

## 2016-06-06 DIAGNOSIS — R109 Unspecified abdominal pain: Secondary | ICD-10-CM | POA: Diagnosis not present

## 2016-06-06 DIAGNOSIS — K210 Gastro-esophageal reflux disease with esophagitis: Secondary | ICD-10-CM | POA: Diagnosis not present

## 2016-06-06 DIAGNOSIS — K254 Chronic or unspecified gastric ulcer with hemorrhage: Secondary | ICD-10-CM | POA: Diagnosis not present

## 2016-06-06 DIAGNOSIS — D500 Iron deficiency anemia secondary to blood loss (chronic): Secondary | ICD-10-CM | POA: Diagnosis present

## 2016-06-06 DIAGNOSIS — K2970 Gastritis, unspecified, without bleeding: Secondary | ICD-10-CM | POA: Diagnosis present

## 2016-06-06 DIAGNOSIS — Z7982 Long term (current) use of aspirin: Secondary | ICD-10-CM | POA: Diagnosis not present

## 2016-06-06 DIAGNOSIS — I2510 Atherosclerotic heart disease of native coronary artery without angina pectoris: Secondary | ICD-10-CM | POA: Diagnosis present

## 2016-06-06 DIAGNOSIS — Z951 Presence of aortocoronary bypass graft: Secondary | ICD-10-CM

## 2016-06-06 DIAGNOSIS — I25810 Atherosclerosis of coronary artery bypass graft(s) without angina pectoris: Secondary | ICD-10-CM

## 2016-06-06 HISTORY — DX: Benign prostatic hyperplasia without lower urinary tract symptoms: N40.0

## 2016-06-06 LAB — CBC
HCT: 30.5 % — ABNORMAL LOW (ref 39.0–52.0)
HCT: 29.5 % — ABNORMAL LOW (ref 39.0–52.0)
HEMOGLOBIN: 10.8 g/dL — AB (ref 13.0–17.0)
Hemoglobin: 10.4 g/dL — ABNORMAL LOW (ref 13.0–17.0)
MCH: 29.4 pg (ref 26.0–34.0)
MCH: 29.4 pg (ref 26.0–34.0)
MCHC: 35.4 g/dL (ref 30.0–36.0)
MCHC: 35.3 g/dL (ref 30.0–36.0)
MCV: 83.1 fL (ref 78.0–100.0)
MCV: 83.3 fL (ref 78.0–100.0)
PLATELETS: 172 10*3/uL (ref 150–400)
PLATELETS: 180 10*3/uL (ref 150–400)
RBC: 3.67 MIL/uL — AB (ref 4.22–5.81)
RBC: 3.54 MIL/uL — ABNORMAL LOW (ref 4.22–5.81)
RDW: 13.2 % (ref 11.5–15.5)
RDW: 13.1 % (ref 11.5–15.5)
WBC: 12.5 10*3/uL — AB (ref 4.0–10.5)
WBC: 12 10*3/uL — ABNORMAL HIGH (ref 4.0–10.5)

## 2016-06-06 LAB — CREATININE, SERUM
CREATININE: 1.02 mg/dL (ref 0.61–1.24)
GFR calc Af Amer: >60 mL/min (ref >60)

## 2016-06-06 LAB — BASIC METABOLIC PANEL
ANION GAP: 9 (ref 5–15)
BUN: 35 mg/dL — ABNORMAL HIGH (ref 6–20)
CHLORIDE: 99 mmol/L — AB (ref 101–111)
CO2: 25 mmol/L (ref 22–32)
Calcium: 8.4 mg/dL — ABNORMAL LOW (ref 8.9–10.3)
Creatinine, Ser: 0.9 mg/dL (ref 0.61–1.24)
GFR calc Af Amer: >60 mL/min (ref >60)
Glucose, Bld: 157 mg/dL — ABNORMAL HIGH (ref 65–99)
POTASSIUM: 3.4 mmol/L — AB (ref 3.5–5.1)
SODIUM: 133 mmol/L — AB (ref 135–145)

## 2016-06-06 LAB — FERRITIN: FERRITIN: 38 ng/mL (ref 24–336)

## 2016-06-06 LAB — IRON AND TIBC
Iron: 108 ug/dL (ref 45–182)
SATURATION RATIOS: 36 % (ref 17.9–39.5)
TIBC: 304 ug/dL (ref 250–450)
UIBC: 196 ug/dL

## 2016-06-06 LAB — ABO/RH: ABO/RH(D): B POS

## 2016-06-06 LAB — TROPONIN I

## 2016-06-06 LAB — HEMOGLOBIN AND HEMATOCRIT, BLOOD
HCT: 27 % — ABNORMAL LOW (ref 39.0–52.0)
Hemoglobin: 9.4 g/dL — ABNORMAL LOW (ref 13.0–17.0)

## 2016-06-06 LAB — RETICULOCYTES
RBC.: 3.54 MIL/uL — AB (ref 4.22–5.81)
RETIC COUNT ABSOLUTE: 131 10*3/uL (ref 19.0–186.0)
Retic Ct Pct: 3.7 % — ABNORMAL HIGH (ref 0.4–3.1)

## 2016-06-06 LAB — VITAMIN B12: Vitamin B-12: 152 pg/mL — ABNORMAL LOW (ref 180–914)

## 2016-06-06 LAB — TSH: TSH: 0.397 u[IU]/mL (ref 0.350–4.500)

## 2016-06-06 LAB — POC OCCULT BLOOD, ED: FECAL OCCULT BLD: POSITIVE — AB

## 2016-06-06 LAB — FOLATE: Folate: 10.1 ng/mL (ref >5.9)

## 2016-06-06 MED ORDER — SODIUM CHLORIDE 0.9 % IV SOLN
8.0000 mg/h | INTRAVENOUS | Status: DC
  Filled 2016-06-06: qty 80

## 2016-06-06 MED ORDER — PANTOPRAZOLE SODIUM 40 MG IV SOLR: 40.0000 mg | Freq: Two times a day (BID) | INTRAVENOUS | Status: DC

## 2016-06-06 MED ORDER — POLYETHYLENE GLYCOL 3350 17 G PO PACK
17.0000 g | PACK | Freq: Every day | ORAL | Status: DC
  Administered 2016-06-06 – 2016-06-09 (×3): 17 g via ORAL
  Filled 2016-06-06 (×3): qty 1

## 2016-06-06 MED ORDER — CARVEDILOL 3.125 MG PO TABS
3.1250 mg | ORAL_TABLET | Freq: Two times a day (BID) | ORAL | Status: DC
  Administered 2016-06-06 – 2016-06-09 (×6): 3.125 mg via ORAL
  Filled 2016-06-06 (×6): qty 1

## 2016-06-06 MED ORDER — FERROUS SULFATE 325 (65 FE) MG PO TABS
325.0000 mg | ORAL_TABLET | Freq: Two times a day (BID) | ORAL | Status: DC
  Administered 2016-06-06 – 2016-06-09 (×6): 325 mg via ORAL
  Filled 2016-06-06 (×6): qty 1

## 2016-06-06 MED ORDER — ACETAMINOPHEN 650 MG RE SUPP: 650.0000 mg | Freq: Four times a day (QID) | RECTAL | Status: DC | PRN

## 2016-06-06 MED ORDER — ACETAMINOPHEN 325 MG PO TABS: 650.0000 mg | ORAL_TABLET | Freq: Four times a day (QID) | ORAL | Status: DC | PRN

## 2016-06-06 MED ORDER — ONDANSETRON HCL 4 MG PO TABS: 4.0000 mg | ORAL_TABLET | Freq: Four times a day (QID) | ORAL | Status: DC | PRN

## 2016-06-06 MED ORDER — SODIUM CHLORIDE 0.9 % IV SOLN
80.0000 mg | Freq: Once | INTRAVENOUS | Status: AC
  Administered 2016-06-06: 80 mg via INTRAVENOUS
  Filled 2016-06-06: qty 80

## 2016-06-06 MED ORDER — ENOXAPARIN SODIUM 40 MG/0.4ML ~~LOC~~ SOLN: 40.0000 mg | SUBCUTANEOUS | Status: DC

## 2016-06-06 MED ORDER — SENNA 8.6 MG PO TABS
1.0000 | ORAL_TABLET | Freq: Two times a day (BID) | ORAL | Status: DC
  Administered 2016-06-06 – 2016-06-09 (×7): 8.6 mg via ORAL
  Filled 2016-06-06 (×7): qty 1

## 2016-06-06 MED ORDER — ATORVASTATIN CALCIUM 10 MG PO TABS
10.0000 mg | ORAL_TABLET | Freq: Every day | ORAL | Status: DC
  Administered 2016-06-07 – 2016-06-09 (×3): 10 mg via ORAL
  Filled 2016-06-06 (×3): qty 1

## 2016-06-06 MED ORDER — ONDANSETRON HCL 4 MG/2ML IJ SOLN: 4.0000 mg | Freq: Four times a day (QID) | INTRAMUSCULAR | Status: DC | PRN

## 2016-06-06 MED ORDER — SODIUM CHLORIDE 0.9 % IV BOLUS (SEPSIS)
1000.0000 mL | Freq: Once | INTRAVENOUS | Status: AC
  Administered 2016-06-06: 1000 mL via INTRAVENOUS

## 2016-06-06 MED ORDER — DEXTROSE-NACL 5-0.45 % IV SOLN
INTRAVENOUS | Status: DC
  Administered 2016-06-06 – 2016-06-08 (×4): via INTRAVENOUS

## 2016-06-06 MED ORDER — PANTOPRAZOLE SODIUM 40 MG IV SOLR
40.0000 mg | Freq: Two times a day (BID) | INTRAVENOUS | Status: DC
  Administered 2016-06-06 – 2016-06-09 (×6): 40 mg via INTRAVENOUS
  Filled 2016-06-06 (×6): qty 40

## 2016-06-06 NOTE — ED Notes (Signed)
MD at bedside. 

## 2016-06-06 NOTE — ED Provider Notes (Signed)
[facility] DEPT Provider Note   CSN: [id number] Arrival date & time: 06/06/16  0516     History   Chief Complaint Chief Complaint  Patient presents with  . Palpitations    HPI Jerry Farrell is a 71 y.o. male.  Patient presents to the emergency department for evaluation of heart palpitations. Patient reports that last night he had an episode of feeling like his heart was racing and he felt slightly short of breath. This occurred when he was walking. He sat down and it went away. This occurred again when he went to the bathroom in the middle of the night. He is not currently expressing any palpitations. He never had any chest discomfort associated with the symptoms. He recently had atenolol changed to Coreg.      Past Medical History:  Diagnosis Date  . CAD (coronary artery disease)    cardiolite ok 3/05, stress test- low risk study 11/06  . Chronic low back pain    with radiculopathy  . ED (erectile dysfunction)   . Gallstones    abd [place]- stable hamangioma, gallbladder sludge and tiny stones 09/2003  . GERD (gastroesophageal reflux disease)   . Hyperlipidemia   . Hypertension   . Transfusion history     Patient Active Problem List   Diagnosis Date Noted  . Hyperglycemia 01/31/2016  . Need for hepatitis C screening test 01/25/2016  . Post-nasal drip 12/10/2015  . GERD (gastroesophageal reflux disease) 07/20/2015  . Abdominal cramps 07/20/2015  . Routine general medical examination at a health care facility 10/28/2014  . Elevated alkaline phosphatase level 10/28/2014  . Urinary frequency 04/29/2014  . BPH (benign prostatic hyperplasia) 04/29/2014  . Prostate cancer screening 08/11/2013  . Encounter for Medicare annual wellness exam 08/11/2013  . TRANSAMINASES, SERUM, ELEVATED 04/05/2010  . Hyperlipidemia 01/09/2007  . ERECTILE DYSFUNCTION 01/09/2007  . Essential hypertension 01/09/2007  . Coronary atherosclerosis 01/09/2007  . ESOPHAGITIS 01/09/2007    Past  Surgical History:  Procedure Laterality Date  . CATARACT EXTRACTION W/PHACO  03/13/2012   Procedure: CATARACT EXTRACTION PHACO AND INTRAOCULAR LENS PLACEMENT (IOC);  Surgeon: Adonis Brook, MD;  Location: [facility];  Service: Ophthalmology;  Laterality: Left;  . CORONARY ARTERY BYPASS GRAFT  1998   x 1  [facility]  . GASTRECTOMY     partial  . MEMBRANE PEEL  03/13/2012   Procedure: MEMBRANE PEEL;  Surgeon: Adonis Brook, MD;  Location: [facility];  Service: Ophthalmology;  Laterality: Left;  . PARS PLANA VITRECTOMY  03/13/2012   Procedure: PARS PLANA VITRECTOMY WITH 23 GAUGE;  Surgeon: Adonis Brook, MD;  Location: [facility];  Service: Ophthalmology;  Laterality: Left;       Home Medications    Prior to Admission medications   Medication Sig Start Date End Date Taking? Authorizing Provider  aspirin EC 81 MG tablet Take 1 tablet (81 mg total) by mouth daily. 06/02/16  Yes Almyra Deforest, PA  atorvastatin (LIPITOR) 10 MG tablet TAKE 1 TABLET BY MOUTH EVERY DAY 04/18/16  Yes Abner Greenspan, MD  brimonidine (ALPHAGAN) 0.2 % ophthalmic solution INSTILL 1 DROP IN LEFT EYE TWICE DAILY 01/21/16  Yes Historical Provider, MD  carvedilol (COREG) 3.125 MG tablet Take 1 tablet (3.125 mg total) by mouth 2 (two) times daily with a meal. 03/31/16 06/29/16 Yes Almyra Deforest, PA  dorzolamide-timolol (COSOPT) 22.3-6.8 MG/ML ophthalmic solution Place 1 drop into the left eye 2 (two) times daily. Use as directed  05/07/13  Yes Historical Provider, MD  tamsulosin (FLOMAX) 0.4 MG CAPS capsule TAKE 1 CAPSULE (0.4 MG TOTAL) BY MOUTH DAILY. 05/29/16  Yes Abner Greenspan, MD  valsartan-hydrochlorothiazide (DIOVAN-HCT) 160-25 MG tablet Take 1 tablet by mouth daily. 02/17/16  Yes Lorretta Harp, MD  omeprazole (PRILOSEC) 40 MG capsule Take 1 capsule (40 mg total) by mouth 2 (two) times daily. 04/09/16 04/24/16  Kinnie Feil, PA-C    Family History Family History  Problem Relation Age of Onset  . Cancer Sister     unknown type, pt feels  alcohol related  . Cancer Brother     colon  . Cancer Brother     throat, pt feels alcohol related  . Colon cancer Brother     half brother  . Hypertension Mother     Social History Social History  Substance Use Topics  . Smoking status: Never Smoker  . Smokeless tobacco: Never Used  . Alcohol use No     Allergies   Patient has no known allergies.   Review of Systems Review of Systems  Cardiovascular: Positive for palpitations. Negative for chest pain.  All other systems reviewed and are negative.    Physical Exam Updated Vital Signs BP 130/64   Pulse 87   Temp 97.8 F (36.6 C) (Oral)   Resp 16   SpO2 97%   Physical Exam  Constitutional: He is oriented to person, place, and time. He appears well-developed and well-nourished. No distress.  HENT:  Head: Normocephalic and atraumatic.  Right Ear: Hearing normal.  Left Ear: Hearing normal.  Nose: Nose normal.  Mouth/Throat: Oropharynx is clear and moist and mucous membranes are normal.  Eyes: Conjunctivae and EOM are normal. Pupils are equal, round, and reactive to light.  Neck: Normal range of motion. Neck supple.  Cardiovascular: Regular rhythm, S1 normal and S2 normal.  Exam reveals no gallop and no friction rub.   No murmur heard. Pulmonary/Chest: Effort normal and breath sounds normal. No respiratory distress. He exhibits no tenderness.  Abdominal: Soft. Normal appearance and bowel sounds are normal. There is no hepatosplenomegaly. There is no tenderness. There is no rebound, no guarding, no tenderness at McBurney's point and negative Murphy's sign. No hernia.  Musculoskeletal: Normal range of motion.  Neurological: He is alert and oriented to person, place, and time. He has normal strength. No cranial nerve deficit or sensory deficit. Coordination normal. GCS eye subscore is 4. GCS verbal subscore is 5. GCS motor subscore is 6.  Skin: Skin is warm, dry and intact. No rash noted. No cyanosis.  Psychiatric: He  has a normal mood and affect. His speech is normal and behavior is normal. Thought content normal.  Nursing note and vitals reviewed.    ED Treatments / Results  Labs (all labs ordered are listed, but only abnormal results are displayed) Labs Reviewed  CBC - Abnormal; Notable for the following:       Result Value   WBC 12.5 (*)    RBC 3.67 (*)    Hemoglobin 10.8 (*)    HCT 30.5 (*)    All other components within normal limits  BASIC METABOLIC PANEL - Abnormal; Notable for the following:    Sodium 133 (*)    Potassium 3.4 (*)    Chloride 99 (*)    Glucose, Bld 157 (*)    BUN 35 (*)    Calcium 8.4 (*)    All other components within normal limits  TSH  TROPONIN I    EKG  EKG Interpretation  Date/Time:  Tuesday June 06 2016 05:22:08 EST Ventricular Rate:  102 PR Interval:    QRS Duration: 88 QT Interval:  339 QTC Calculation: 442 R Axis:   78 Text Interpretation:  Sinus tachycardia Probable left atrial enlargement Borderline repolarization abnormality Confirmed by Korie Streat  MD, Ashunti Schofield [id number]) on 06/06/2016 6:40:35 AM       Radiology No results found.  Procedures Procedures (including critical care time)  Medications Ordered in ED Medications - No data to display   Initial Impression / Assessment and Plan / ED Course  I have reviewed the triage vital signs and the nursing notes.  Pertinent labs & imaging results that were available during my care of the patient were reviewed by me and considered in my medical decision making (see chart for details).     Patient presents with complaints of heart palpitations that occur 2 separate times in the last 12 hours. It is not currently occurring. Patient reports recently having a stress test that showed no abnormality. He did recently have medication changes made. He had difficulty finding a pharmacy that had his atenolol, his doctor changed him to [place]. It's not clear if this is contributing to the palpitations.  He has been monitored here in the ER, no arrhythmia. Troponin is negative. TSH was normal. Blood counts are normal. Patient will require outpatient monitoring if palpitations continue, but does not require inpatient hospitalization at this time. Will refer back to his cardiologist, Dr. Gwenlyn Found.  Final Clinical Impressions(s) / ED Diagnoses   Final diagnoses:  Heart palpitations    New Prescriptions New Prescriptions   No medications on file     Orpah Greek, MD 06/06/16 [phone]

## 2016-06-06 NOTE — ED Notes (Signed)
ADMITTING Provider at bedside. 

## 2016-06-06 NOTE — ED Provider Notes (Addendum)
Informed by nurse that patient very lightheaded, orthostatic, with hr 120-130 upon standing for discharge.  On review of chart, hgb was 16.5 two months ago, and now is 10.8, bun also high. Pt reports recent dark brown stool. Stool is very dark brown, and strongly heme positive.   Iv ns bolus. protonix iv. Hospitalists consulted for admission. Gi consult placed.   Discussed with Edward Jolly for Dr Fuller Plan - she indicates they will see in consult/medicine to admit.          Lajean Saver, MD 06/06/16 [phone]

## 2016-06-06 NOTE — ED Notes (Addendum)
RECTAL OCCULT PERFORMED BY EDP

## 2016-06-06 NOTE — ED Notes (Signed)
Pt upon standing to c/o of dizziness, nausea, and increased HR. HR 125. Pt is without CP/SOB. Pt states he does not feel any better. EDP Pollina not available. EDP Steinl made aware of pt current status.

## 2016-06-06 NOTE — Consult Note (Signed)
Consultation  Referring Provider: Karenann Cai - Dr Ashok Cordia Primary Care Physician:  [name], MD Primary Gastroenterologist:  Dr.Stark  Reason for Consultation:  Subacute GI bleed, symptomatic anemia  HPI: Jerry Farrell is a 71 y.o. male , known to Dr. Fuller Plan who is admitted through the emergency room this morning after presenting with weakness and dizziness. Patient says he has had some mild symptoms off and on for the past month, but worse over the past week. He has been having episodes of lightheadedness and dizziness. He said last evening he developed palpitations, and this morning was very dizzy after he stood up. Evaluation in the ER this morning showed hemoglobin of 10.8, hematocrit of 30.5 WBC of 12.5, BUN 35, creatinine 0.9 and potassium of 3.9. His baseline hemoglobin was 16.5 and December 2017.  He was orthostatic in ER. Patient says that he has noticed very dark stools which were loose the day after he had recent EGD on 06/01/2016. He has not noticed overt blood or black stools and says he really hasn't had much bowel movement over the past few days. He feels much better after esophageal dilation and has no complaints of dysphagia or odynophagia. No nausea or vomiting. He does admit to having an unsettled stomach last night. He has been taking omeprazole 40 mg by mouth every morning long term. No blood thinners but has been on baby aspirin once daily. Last colonoscopy done in October 2017 with finding of internal hemorrhoids and a 4 mm polyp in the cecum which was removed. EGD was done last week on 06/01/2016 for complaints of dysphagia. He was noted to have Billroth I anatomy, there was no stricture but due to complaints was separated dilated to 16 mm., Also noted mild gastritis. Past history is pertinent for coronary artery disease status post CABG, remote history of peptic ulcer disease for which she underwent Billroth I in the 1970s,, hypertension, hyperlipidemia and  cholelithiasis.   Past Medical History:  Diagnosis Date  . BPH (benign prostatic hyperplasia)   . CAD (coronary artery disease)    cardiolite ok 3/05, stress test- low risk study 11/06  . Chronic low back pain    with radiculopathy  . ED (erectile dysfunction)   . Gallstones    abd [place]- stable hamangioma, gallbladder sludge and tiny stones 09/2003  . GERD (gastroesophageal reflux disease)   . Hyperlipidemia   . Hypertension   . Transfusion history     Past Surgical History:  Procedure Laterality Date  . CATARACT EXTRACTION W/PHACO  03/13/2012   Procedure: CATARACT EXTRACTION PHACO AND INTRAOCULAR LENS PLACEMENT (IOC);  Surgeon: Adonis Brook, MD;  Location: [place];  Service: Ophthalmology;  Laterality: Left;  . CORONARY ARTERY BYPASS GRAFT  1998   x 1  [facility]  . GASTRECTOMY     partial  . MEMBRANE PEEL  03/13/2012   Procedure: MEMBRANE PEEL;  Surgeon: Adonis Brook, MD;  Location: [place];  Service: Ophthalmology;  Laterality: Left;  . PARS PLANA VITRECTOMY  03/13/2012   Procedure: PARS PLANA VITRECTOMY WITH 23 GAUGE;  Surgeon: Adonis Brook, MD;  Location: [place];  Service: Ophthalmology;  Laterality: Left;    Prior to Admission medications   Medication Sig Start Date End Date Taking? Authorizing Provider  aspirin EC 81 MG tablet Take 1 tablet (81 mg total) by mouth daily. 06/02/16  Yes Almyra Deforest, PA  atorvastatin (LIPITOR) 10 MG tablet TAKE 1 TABLET BY MOUTH EVERY DAY 04/18/16  Yes Roque Lias  A Tower, MD  brimonidine (ALPHAGAN) 0.2 % ophthalmic solution INSTILL 1 DROP IN LEFT EYE TWICE DAILY 01/21/16  Yes Historical Provider, MD  carvedilol (COREG) 3.125 MG tablet Take 1 tablet (3.125 mg total) by mouth 2 (two) times daily with a meal. 03/31/16 06/29/16 Yes Almyra Deforest, PA  dorzolamide-timolol (COSOPT) 22.3-6.8 MG/ML ophthalmic solution Place 1 drop into the left eye 2 (two) times daily. Use as directed  05/07/13  Yes Historical Provider, MD  tamsulosin (FLOMAX) 0.4 MG CAPS capsule TAKE 1  CAPSULE (0.4 MG TOTAL) BY MOUTH DAILY. 05/29/16  Yes Abner Greenspan, MD  valsartan-hydrochlorothiazide (DIOVAN-HCT) 160-25 MG tablet Take 1 tablet by mouth daily. 02/17/16  Yes Lorretta Harp, MD  omeprazole (PRILOSEC) 40 MG capsule Take 1 capsule (40 mg total) by mouth 2 (two) times daily. 04/09/16 04/24/16  Kinnie Feil, PA-C    Current Facility-Administered Medications  Medication Dose Route Frequency Provider Last Rate Last Dose  . acetaminophen (TYLENOL) tablet 650 mg  650 mg Oral Q6H PRN Doreatha Lew, MD       Or  . acetaminophen (TYLENOL) suppository 650 mg  650 mg Rectal Q6H PRN Doreatha Lew, MD      . Derrill Memo ON 06/07/2016] atorvastatin (LIPITOR) tablet 10 mg  10 mg Oral Daily Doreatha Lew, MD      . carvedilol (COREG) tablet 3.125 mg  3.125 mg Oral BID WC Doreatha Lew, MD      . ferrous sulfate tablet 325 mg  325 mg Oral BID WC Doreatha Lew, MD      . ondansetron [facility]) tablet 4 mg  4 mg Oral Q6H PRN Doreatha Lew, MD       Or  . ondansetron [facility]) injection 4 mg  4 mg Intravenous Q6H PRN Doreatha Lew, MD      . pantoprazole (PROTONIX) injection 40 mg  40 mg Intravenous Q12H Doreatha Lew, MD      . polyethylene glycol [facility] / GLYCOLAX) packet 17 g  17 g Oral Daily Doreatha Lew, MD      . [facility]) tablet 8.6 mg  1 tablet Oral BID Doreatha Lew, MD        Allergies as of 06/06/2016  . (No Known Allergies)    Family History  Problem Relation Age of Onset  . Cancer Sister     unknown type, pt feels alcohol related  . Cancer Brother     colon  . Cancer Brother     throat, pt feels alcohol related  . Colon cancer Brother     half brother  . Hypertension Mother     Social History   Social History  . Marital status: Married    Spouse name: N/A  . Number of children: 3  . Years of education: N/A   Occupational History  . [employer] Retired   Social History Main Topics  . Smoking status: Never  Smoker  . Smokeless tobacco: Never Used  . Alcohol use No  . Drug use: No  . Sexual activity: Yes   Other Topics Concern  . Not on file   Social History Narrative  . No narrative on file    Review of Systems: Pertinent positive and negative review of systems were noted in the above HPI section.  All other review of systems was otherwise negative.  Physical Exam: Vital signs in last 24 hours: Temp:  [97.8 F (36.6 C)-98.6 F (37 C)] 98.6 F (  37 C) (02/13 0726) Pulse Rate:  [78-101] 84 (02/13 1000) Resp:  [11-20] 18 (02/13 1000) BP: (108-146)/(59-72) 116/59 (02/13 1000) SpO2:  [97 %-100 %] 100 % (02/13 1000)   General:   Alert,  Well-developed, well-nourished,older AA male  pleasant and cooperative in NAD Head:  Normocephalic and atraumatic. Eyes:  Sclera clear, no icterus.   Conjunctiva pink. Ears:  Normal auditory acuity. Nose:  No deformity, discharge,  or lesions. Mouth:  No deformity or lesions.   Neck:  Supple; no masses or thyromegaly. Lungs:  Clear throughout to auscultation.   No wheezes, crackles, or rhonchi. Heart:  Regular rate and rhythm; no murmurs, clicks, rubs,  or gallops, sternal scar Abdomen:  Soft,nontender, BS active,nonpalp mass or hsm.   Rectal:  Not repeated , dark heme positive stool in ER Msk:  Symmetrical without gross deformities. . Pulses:  Normal pulses noted. Extremities:  Without clubbing or edema. Neurologic:  Alert and  oriented x4;  grossly normal neurologically. Skin:  Intact without significant lesions or rashes.. Psych:  Alert and cooperative. Normal mood and affect.  Intake/Output from previous day: No intake/output data recorded. Intake/Output this shift: Total I/O In: -  Out: 400 [Urine:400]  Lab Results:  Recent Labs  06/06/16 0546 06/06/16 0950  WBC 12.5* 12.0*  HGB 10.8* 10.4*  HCT 30.5* 29.5*  PLT 172 180   BMET  Recent Labs  06/06/16 0546 06/06/16 0950  NA 133*  --   K 3.4*  --   CL 99*  --   CO2 25  --    GLUCOSE 157*  --   BUN 35*  --   CREATININE 0.90 1.02  CALCIUM 8.4*  --    LFT No results for input(s): PROT, ALBUMIN, AST, ALT, ALKPHOS, BILITOT, BILIDIR, IBILI in the last 72 hours. PT/INR No results for input(s): LABPROT, INR in the last 72 hours. Hepatitis Panel No results for input(s): HEPBSAG, HCVAB, HEPAIGM, HEPBIGM in the last 72 hours.   IMPRESSION:  #32 71 year old African-American male with weakness and lightheadedness, orthostasis  felt secondary to 5 g drop in hemoglobin over the past 6 weeks. He appears to have had a subacute GI bleed, this may have occurred over the past 1 week since esophageal dilation. Bleeding may be secondary to small tear with dilation. #2 status post remote Billroth I for peptic ulcer disease-no evidence of ulcer at time of EGD 1 week ago #3 history of colon polyps status post recent colonoscopy October 2017 #4 coronary artery disease status post CABG #5 hypertension #6 hyperlipidemia #7 GERD #8 cholelithiasis  Plan; Clear liquid diet, nothing by mouth after midnight for possible EGD Monitor serial hemoglobins and transfuse as indicated for hemoglobin 8 or less. If patient has any further drop in hemoglobin would proceed with EGD in a.m. per Dr. Silverio Decamp IV PPI Hold aspirin Lovenox had been ordered which I discontinued Thank you Will follow with you   Kindra Bickham  06/06/2016, 11:04 AM

## 2016-06-06 NOTE — ED Triage Notes (Signed)
Pt states that his heart was racing last night before he went to bed, he was able to go to sleep but it was fast again this am

## 2016-06-06 NOTE — H&P (Signed)
History and Physical    Jerry Farrell [id number] DOB: 07-02-1945  DOA: 06/06/2016 PCP: [name], MD  Patient coming from: Home  Chief Complaint: Palpitations and dizziness   HPI: Jerry Farrell is a 71 y.o. male with medical history significant of CAD status post CABG, hypertension, BPH, and gastritis. She presented to the ED complaining of palpitations upon standing and dizziness. Patient reported symptoms worsened last night but hasn't been happening for over 2 months. Patient had cardiac workup for the same which so far has been negative. Also had an endoscopy on 06/03/2015 showing only gastritis. Patient reported after his endoscopy he had black stools, no mouth movement for the past 3 days. Patient reported that his dizziness is like he's going to pass out, and he happened every time that he goes from sitting to standing position. Denies chest pain, diaphoresis, and loss of consciousness.  ED Course: Lab workup revealed hemoglobin of 10.8, orthostatic hypotension and tachycardic. BUN 35. Positive guaiac. EDP consulted GI who recommended admitting for evaluation.  Review of Systems:   General:  Positive for dizziness and generalized weakness  HEENT: no blurry vision, hearing changes or sore throat Respiratory:  Positive for dyspnea. No cough or wheezing  CV: Positive for palpitations. No chest pain  GI:  Positive dark stools. No nausea, vomiting, abdominal pain, diarrhea GU: no dysuria, burning on urination, increased urinary frequency, hematuria  Ext:. No deformities Neuro: no unilateral weakness, numbness, or tingling Skin: No rashes, lesions or wounds. MSK: No muscle spasm, no deformity, no limitation of range of movement in spin Heme: No easy bruising.     Past Medical History:  Diagnosis Date  . BPH (benign prostatic hyperplasia)   . CAD (coronary artery disease)    cardiolite ok 3/05, stress test- low risk study 11/06  . Chronic low back pain    with  radiculopathy  . ED (erectile dysfunction)   . Gallstones    abd [place]- stable hamangioma, gallbladder sludge and tiny stones 09/2003  . GERD (gastroesophageal reflux disease)   . Hyperlipidemia   . Hypertension   . Transfusion history     Past Surgical History:  Procedure Laterality Date  . CATARACT EXTRACTION W/PHACO  03/13/2012   Procedure: CATARACT EXTRACTION PHACO AND INTRAOCULAR LENS PLACEMENT (IOC);  Surgeon: Adonis Brook, MD;  Location: [place];  Service: Ophthalmology;  Laterality: Left;  . CORONARY ARTERY BYPASS GRAFT  1998   x 1  done [facility]  . GASTRECTOMY     partial  . MEMBRANE PEEL  03/13/2012   Procedure: MEMBRANE PEEL;  Surgeon: Adonis Brook, MD;  Location: [place];  Service: Ophthalmology;  Laterality: Left;  . PARS PLANA VITRECTOMY  03/13/2012   Procedure: PARS PLANA VITRECTOMY WITH 23 GAUGE;  Surgeon: Adonis Brook, MD;  Location: [place];  Service: Ophthalmology;  Laterality: Left;     reports that he has never smoked. He has never used smokeless tobacco. He reports that he does not drink alcohol or use drugs.  No Known Allergies  Family History  Problem Relation Age of Onset  . Cancer Sister     unknown type, pt feels alcohol related  . Cancer Brother     colon  . Cancer Brother     throat, pt feels alcohol related  . Colon cancer Brother     half brother  . Hypertension Mother    Family history reviewed and not pertinent  Prior to Admission medications   Medication Sig Start Date  End Date Taking? Authorizing Provider  aspirin EC 81 MG tablet Take 1 tablet (81 mg total) by mouth daily. 06/02/16  Yes Almyra Deforest, PA  atorvastatin (LIPITOR) 10 MG tablet TAKE 1 TABLET BY MOUTH EVERY DAY 04/18/16  Yes Abner Greenspan, MD  brimonidine (ALPHAGAN) 0.2 % ophthalmic solution INSTILL 1 DROP IN LEFT EYE TWICE DAILY 01/21/16  Yes Historical Provider, MD  carvedilol (COREG) 3.125 MG tablet Take 1 tablet (3.125 mg total) by mouth 2 (two) times daily with a meal. 03/31/16  06/29/16 Yes Almyra Deforest, PA  dorzolamide-timolol (COSOPT) 22.3-6.8 MG/ML ophthalmic solution Place 1 drop into the left eye 2 (two) times daily. Use as directed  05/07/13  Yes Historical Provider, MD  tamsulosin (FLOMAX) 0.4 MG CAPS capsule TAKE 1 CAPSULE (0.4 MG TOTAL) BY MOUTH DAILY. 05/29/16  Yes Abner Greenspan, MD  valsartan-hydrochlorothiazide (DIOVAN-HCT) 160-25 MG tablet Take 1 tablet by mouth daily. 02/17/16  Yes Lorretta Harp, MD  omeprazole (PRILOSEC) 40 MG capsule Take 1 capsule (40 mg total) by mouth 2 (two) times daily. 04/09/16 04/24/16  Kinnie Feil, PA-C    Physical Exam: Vitals:   06/06/16 0630 06/06/16 0700 06/06/16 0726 06/06/16 0847  BP: 125/62 130/64 131/65 122/63  Pulse: 87 87 78 80  Resp: 17 16 14 16   Temp:   98.6 F (37 C)   TempSrc:   Oral   SpO2: 99% 97% 100% 100%     Constitutional: NAD, calm, comfortable Eyes: PERRL, lids and conjunctivae normal ENMT: Mucous membranes are moist. Posterior pharynx clear   Neck: normal, supple, no masses, no thyromegaly Respiratory: clear to auscultation bilaterally, no wheezing, no crackles. Normal respiratory effort.  Cardiovascular: [id number] RRR, Diastolic murmur 1/6 best heard at the LSB Abdomen: Soft nontender nondistended positive bowel sounds, no abnormal mass palpated  Musculoskeletal: no clubbing / cyanosis. No joint deformity upper and lower extremities.  Skin: no rashes, lesions, ulcers.  Neurologic: CN 2-12 grossly intact. Sensation intact, DTR normal. Strength 5/5 in all 4.  Psychiatric: Normal judgment and insight. Alert and oriented x 3. Normal mood.    Labs on Admission: I have personally reviewed following labs and imaging studies  CBC:  Recent Labs Lab 06/06/16 0546  WBC 12.5*  HGB 10.8*  HCT 30.5*  MCV 83.1  PLT [id number]   Basic Metabolic Panel:  Recent Labs Lab 06/06/16 0546  NA 133*  K 3.4*  CL 99*  CO2 25  GLUCOSE 157*  BUN 35*  CREATININE 0.90  CALCIUM 8.4*   GFR: Estimated Creatinine  Clearance: 71.8 mL/min (by C-G formula based on SCr of 0.9 mg/dL).  Cardiac Enzymes:  Recent Labs Lab 06/06/16 0546  TROPONINI <0.03   Thyroid Function Tests:  Recent Labs  06/06/16 0546  TSH 0.397   Anemia Panel: No results for input(s): VITAMINB12, FOLATE, FERRITIN, TIBC, IRON, RETICCTPCT in the last 72 hours. Urine analysis:    Component Value Date/Time   BILIRUBINUR neg. 04/29/2014 1534   PROTEINUR 30+ 04/29/2014 1534   UROBILINOGEN 0.2 04/29/2014 1534   NITRITE neg. 04/29/2014 1534   LEUKOCYTESUR Negative 04/29/2014 1534    Radiological Exams on Admission: No results found.  EKG: Independently reviewed. Sinus tachycardia, mild repolarization   Assessment/Plan Orthostatic hypotension - in view of blood loss Hgb 16.5 on 03/2016, today 10.8. ? GI bleed. FOBT positive Patient had endoscopy on Friday with no sig abnormalities other than gastritis. Colonoscopy on 01/2016 polyp was removed and + internal hemorrhoids . vs dehydration (patient report poor  fluid intake) vs med side effect (flomax and Diovan). On physical exam HR upon standing up to 130's from 80's BP 131/62 sitting, 107/67 standing Admit to tele  Check orthostatics  Hold Flomax and Diovan  IVF   HTN - stable  Continue coreg  Hold Diovan  Monitor BP   Gastritis  Received Protonix 80 mg in ED  Continue Protonix 40 mg BID  GI consulted by EDP   Anemia unknown etiology at this time - likely chronic blood loss and iron deficiency  No need for transfusion at this point  Check CBC Anemia panel  Will start Iron supplements   BPH - stable  Holding Flomax due to orthostatics   CAD - s/p CABG in 1994 - stable no chest pain, had nuclear stress on 03/2016 with EF 63% No MWA and low risk study, will monitor for now.   DVT prophylaxis: Lovenox  Code Status: FULL Family Communication: Wife at bedside Disposition Plan: Anticipate discharge to previous home environment.  Consults called: GI - Dr Fuller Plan by EDP   Admission status: Obs/tele    Chipper Oman MD [facility] Pager: Text Page via [email or web address]  [phone]  If 7PM-7AM, please contact night-coverage [email or web address] Password [facility]  06/06/2016, 9:37 AM

## 2016-06-06 NOTE — ED Notes (Signed)
ED Provider at bedside. 

## 2016-06-07 ENCOUNTER — Encounter (HOSPITAL_COMMUNITY): Payer: Self-pay | Admitting: *Deleted

## 2016-06-07 ENCOUNTER — Encounter (HOSPITAL_COMMUNITY)
Admission: EM | Disposition: A | Discharge status: Home / Self Care | Payer: Self-pay | Source: Home / Self Care | Attending: Internal Medicine

## 2016-06-07 ENCOUNTER — Observation Stay (HOSPITAL_COMMUNITY): Payer: Medicare Other | Admitting: Certified Registered Nurse Anesthetist

## 2016-06-07 DIAGNOSIS — R002 Palpitations: Secondary | ICD-10-CM

## 2016-06-07 DIAGNOSIS — I951 Orthostatic hypotension: Principal | ICD-10-CM

## 2016-06-07 DIAGNOSIS — I10 Essential (primary) hypertension: Secondary | ICD-10-CM

## 2016-06-07 DIAGNOSIS — K922 Gastrointestinal hemorrhage, unspecified: Secondary | ICD-10-CM

## 2016-06-07 HISTORY — PX: ESOPHAGOGASTRODUODENOSCOPY (EGD) WITH PROPOFOL: SHX5813

## 2016-06-07 LAB — CBC
HCT: 26.6 % — ABNORMAL LOW (ref 39.0–52.0)
HEMOGLOBIN: 9.4 g/dL — AB (ref 13.0–17.0)
MCH: 29.6 pg (ref 26.0–34.0)
MCHC: 35.3 g/dL (ref 30.0–36.0)
MCV: 83.6 fL (ref 78.0–100.0)
Platelets: 169 10*3/uL (ref 150–400)
RBC: 3.18 MIL/uL — ABNORMAL LOW (ref 4.22–5.81)
RDW: 13.5 % (ref 11.5–15.5)
WBC: 9.8 10*3/uL (ref 4.0–10.5)

## 2016-06-07 LAB — BASIC METABOLIC PANEL
ANION GAP: 8 (ref 5–15)
BUN: 25 mg/dL — ABNORMAL HIGH (ref 6–20)
CALCIUM: 8.4 mg/dL — AB (ref 8.9–10.3)
CO2: 25 mmol/L (ref 22–32)
Chloride: 100 mmol/L — ABNORMAL LOW (ref 101–111)
Creatinine, Ser: 1.02 mg/dL (ref 0.61–1.24)
GFR calc Af Amer: >60 mL/min (ref >60)
GFR calc non Af Amer: >60 mL/min (ref >60)
GLUCOSE: 150 mg/dL — AB (ref 65–99)
Potassium: 3 mmol/L — ABNORMAL LOW (ref 3.5–5.1)
Sodium: 133 mmol/L — ABNORMAL LOW (ref 135–145)

## 2016-06-07 LAB — HEMOGLOBIN AND HEMATOCRIT, BLOOD
HCT: 24.8 % — ABNORMAL LOW (ref 39.0–52.0)
HEMOGLOBIN: 8.8 g/dL — AB (ref 13.0–17.0)

## 2016-06-07 SURGERY — ESOPHAGOGASTRODUODENOSCOPY (EGD) WITH PROPOFOL
Anesthesia: Monitor Anesthesia Care

## 2016-06-07 MED ORDER — PROPOFOL 10 MG/ML IV BOLUS
INTRAVENOUS | Status: DC | PRN
  Administered 2016-06-07 (×2): 20 mg via INTRAVENOUS

## 2016-06-07 MED ORDER — LIDOCAINE 2% (20 MG/ML) 5 ML SYRINGE
INTRAMUSCULAR | Status: AC
  Filled 2016-06-07: qty 5

## 2016-06-07 MED ORDER — BRIMONIDINE TARTRATE 0.2 % OP SOLN
1.0000 [drp] | Freq: Two times a day (BID) | OPHTHALMIC | Status: DC
  Administered 2016-06-07 – 2016-06-09 (×4): 1 [drp] via OPHTHALMIC
  Filled 2016-06-07: qty 5

## 2016-06-07 MED ORDER — PROPOFOL 10 MG/ML IV BOLUS
INTRAVENOUS | Status: AC
Start: 2016-06-07 — End: 2016-06-07
  Filled 2016-06-07: qty 20

## 2016-06-07 MED ORDER — ONDANSETRON HCL 4 MG/2ML IJ SOLN
INTRAMUSCULAR | Status: AC
  Filled 2016-06-07: qty 2

## 2016-06-07 MED ORDER — LACTATED RINGERS IV SOLN
INTRAVENOUS | Status: DC | PRN
  Administered 2016-06-07: 14:00:00 via INTRAVENOUS

## 2016-06-07 MED ORDER — PROPOFOL 500 MG/50ML IV EMUL
INTRAVENOUS | Status: DC | PRN
  Administered 2016-06-07: 200 ug/kg/min via INTRAVENOUS

## 2016-06-07 MED ORDER — DORZOLAMIDE HCL-TIMOLOL MAL 2-0.5 % OP SOLN
1.0000 [drp] | Freq: Two times a day (BID) | OPHTHALMIC | Status: DC
  Administered 2016-06-07 – 2016-06-09 (×4): 1 [drp] via OPHTHALMIC
  Filled 2016-06-07: qty 10

## 2016-06-07 MED ORDER — PROPOFOL 10 MG/ML IV BOLUS
INTRAVENOUS | Status: AC
  Filled 2016-06-07: qty 40

## 2016-06-07 MED ORDER — SODIUM CHLORIDE 0.9 % IV SOLN
30.0000 meq | Freq: Once | INTRAVENOUS | Status: AC
  Administered 2016-06-07: 30 meq via INTRAVENOUS
  Filled 2016-06-07: qty 15

## 2016-06-07 MED ORDER — ONDANSETRON HCL 4 MG/2ML IJ SOLN
INTRAMUSCULAR | Status: DC | PRN
  Administered 2016-06-07: 4 mg via INTRAVENOUS

## 2016-06-07 SURGICAL SUPPLY — 15 items

## 2016-06-07 NOTE — Anesthesia Postprocedure Evaluation (Signed)
Anesthesia Post Note  Patient: Jerry Farrell  Procedure(s) Performed: Procedure(s) (LRB): ESOPHAGOGASTRODUODENOSCOPY (EGD) WITH PROPOFOL (N/A)  Patient location during evaluation: PACU Anesthesia Type: MAC Level of consciousness: awake Pain management: pain level controlled Vital Signs Assessment: post-procedure vital signs reviewed and stable Respiratory status: spontaneous breathing Anesthetic complications: no       Last Vitals:  Vitals:   06/07/16 1317 06/07/16 1443  BP: (!) 133/59   Pulse:    Resp: 16 16  Temp: 36.8 C 36.8 C    Last Pain:  Vitals:   06/07/16 1443  TempSrc: Oral  PainSc:                  Julus Kelley

## 2016-06-07 NOTE — Transfer of Care (Signed)
Immediate Anesthesia Transfer of Care Note  Patient: Jerry Farrell  Procedure(s) Performed: Procedure(s): ESOPHAGOGASTRODUODENOSCOPY (EGD) WITH PROPOFOL (N/A)  Patient Location: PACU  Anesthesia Type:MAC  Level of Consciousness:  sedated, patient cooperative and responds to stimulation  Airway & Oxygen Therapy:Patient Spontanous Breathing and Patient connected to face mask oxgen  Post-op Assessment:  Report given to PACU RN and Post -op Vital signs reviewed and stable  Post vital signs:  Reviewed and stable  Last Vitals:  Vitals:   06/07/16 0511 06/07/16 1317  BP: (!) 116/59 (!) 133/59  Pulse: 86   Resp:  16  Temp: 37.1 C 35.3 C    Complications: No apparent anesthesia complications

## 2016-06-07 NOTE — Op Note (Signed)
[facility] Patient Name: Jerry Farrell Procedure Date: 06/07/2016 MRN: [id number] Attending MD: Mauri Pole , MD Date of Birth: 12/04/1945 CSN: [id number] Age: 71 Admit Type: Inpatient Procedure:                Upper GI endoscopy Indications:              Recent gastrointestinal bleeding, Suspected upper                            gastrointestinal bleeding Providers:                Mauri Pole, MD, Cleda Daub, RN, Corliss Parish, Technician Referring MD:              Medicines:                Monitored Anesthesia Care Complications:            No immediate complications. Estimated Blood Loss:     Estimated blood loss was minimal. Procedure:                Pre-Anesthesia Assessment:                           - Prior to the procedure, a History and Physical                            was performed, and patient medications and                            allergies were reviewed. The patient's tolerance of                            previous anesthesia was also reviewed. The risks                            and benefits of the procedure and the sedation                            options and risks were discussed with the patient.                            All questions were answered, and informed consent                            was obtained. Prior Anticoagulants: The patient has                            taken no previous anticoagulant or antiplatelet                            agents. ASA Grade Assessment: III - A patient with  severe systemic disease. After reviewing the risks                            and benefits, the patient was deemed in                            satisfactory condition to undergo the procedure.                           After obtaining informed consent, the endoscope was                            passed under direct vision. Throughout the   procedure, the patient's blood pressure, pulse, and                            oxygen saturations were monitored continuously. The                            [id number]) scope was introduced through the                            mouth, and advanced to the second part of duodenum.                            The upper GI endoscopy was accomplished without                            difficulty. The patient tolerated the procedure                            well. Scope In: Scope Out: Findings:      The esophagus was normal.      Evidence of a Billroth I gastroduodenostomy was found.      One gastric ulcer with adherent clot was found on the greater curvature       of the stomach. Unable to move the clot with wash or suction to       visualize the ulcer underneath. The lesion was approximately 5-6 mm in       largest dimension. For hemostasis, three hemostatic clips were       successfully placed (MR conditional). There was no bleeding at the end       of the procedure.      The examined duodenum was normal. Impression:               - Normal esophagus.                           - Billroth I gastroduodenostomy was found.                           - Gastric ulcer with adherent clot. Clips (MR                            conditional) were placed.                           -  Normal examined duodenum.                           - No specimens collected. Moderate Sedation:      N/A Recommendation:           - Patient has a contact number available for                            emergencies. The signs and symptoms of potential                            delayed complications were discussed with the                            patient. Return to normal activities tomorrow.                            Written discharge instructions were provided to the                            patient.                           - Clear liquid diet today. Then advance as                            tolerated  to soft diet                           - Protonix 40mg  BID                           - Continue present medications.                           - No ibuprofen, naproxen, or other non-steroidal                            anti-inflammatory drugs. Procedure Code(s):        --- Professional ---                           [phone], Esophagogastroduodenoscopy, flexible,                            transoral; with control of bleeding, any method Diagnosis Code(s):        --- Professional ---                           Z98.0, Intestinal bypass and anastomosis status                           K25.4, Chronic or unspecified gastric ulcer with                            hemorrhage  K92.2, Gastrointestinal hemorrhage, unspecified CPT copyright 2016 [employer]. All rights reserved. The codes documented in this report are preliminary and upon coder review may  be revised to meet current compliance requirements. Mauri Pole, MD 06/07/2016 3:03:04 PM This report has been signed electronically. Number of Addenda: 0

## 2016-06-07 NOTE — Anesthesia Preprocedure Evaluation (Signed)
Anesthesia Evaluation  Patient identified by MRN, date of birth, ID band Patient awake    Reviewed: Allergy & Precautions, NPO status , Patient's Chart, lab work & pertinent test results  Airway Mallampati: II  TM Distance: >3 FB     Dental   Pulmonary neg pulmonary ROS,    breath sounds clear to auscultation       Cardiovascular hypertension, + CAD   Rhythm:Regular Rate:Normal     Neuro/Psych    GI/Hepatic Neg liver ROS, GERD  ,  Endo/Other  negative endocrine ROS  Renal/GU negative Renal ROS     Musculoskeletal   Abdominal   Peds  Hematology  (+) anemia ,   Anesthesia Other Findings   Reproductive/Obstetrics                             Anesthesia Physical Anesthesia Plan  ASA: III  Anesthesia Plan: MAC   Post-op Pain Management:    Induction: Intravenous  Airway Management Planned: Simple Face Mask  Additional Equipment:   Intra-op Plan:   Post-operative Plan:   Informed Consent: I have reviewed the patients History and Physical, chart, labs and discussed the procedure including the risks, benefits and alternatives for the proposed anesthesia with the patient or authorized representative who has indicated his/her understanding and acceptance.   Dental advisory given  Plan Discussed with: CRNA and Anesthesiologist  Anesthesia Plan Comments:         Anesthesia Quick Evaluation

## 2016-06-07 NOTE — Progress Notes (Signed)
Pt's watch and ring given to pt's wife, Leddie. Pt and RN saw Leddie place pt cup with watch and ring in her pocket book. Brt, rn

## 2016-06-07 NOTE — Progress Notes (Signed)
PROGRESS NOTE    Jerry Farrell  [id number] DOB: [birth year] DOA: 06/06/2016 PCP: [name], MD    Brief Narrative:  71 y.o. male with medical history significant of CAD status post CABG, hypertension, BPH, and gastritis. She presented to the ED complaining of palpitations upon standing and dizziness. Patient reported symptoms worsened last night but hasn't been happening for over 2 months. Patient had cardiac workup for the same which so far has been negative. Also had an endoscopy on 06/03/2015 showing only gastritis. Patient reported after his endoscopy he had black stools, no mouth movement for the past 3 days. Patient reported that his dizziness is like he's going to pass out, and he happened every time that he goes from sitting to standing position. Denies chest pain, diaphoresis, and loss of consciousness.  Assessment & Plan:   Active Problems:   Essential hypertension   BPH (benign prostatic hyperplasia)   Orthostatic hypotension   Symptomatic anemia   Acute GI bleeding   Orthostatic hypotension  -Concerns for acute blood loss anemia - FOBT was positive - Patient is s/p endoscopy on Friday reportedly with findings notable for gastritis, no other acute process - Colonoscopy last performed on 01/2016 wtih polypectomy performed and findings of internal hemorrhoids -Pt continued with IVF overnight - Stable this AM  HTN - stable  - Will continue coreg as tolerated - Holding Diovan given orthostatic hypotension at admission  Gastritis  - Pt has received Protonix 80 mg in ED  -Will continue Protonix 40 mg BID  - GI consulted. Pt to undergo endoscopy this afternoon  Anemia unknown etiology at this time - likely chronic blood loss and iron deficiency  - Started Iron supplementation - Hgb remains stable this AM - Endoscopy planned per above  BPH  - stable currently - Flomax on hold due to orthostatics   CAD  - s/p CABG in 1994  - remains stable without chest  pain - Pt is s/p recent nuclear stress on 03/2016 with EF 63% No MWA and low risk study  DVT prophylaxis: SCD's Code Status: Full Family Communication: Pt in room, family not at bedside Disposition Plan: Uncertain at this time  Consultants:   GI  Procedures:     Antimicrobials: Anti-infectives    None       Subjective: No complaints this AM  Objective: Vitals:   06/06/16 1500 06/06/16 2152 06/07/16 0511 06/07/16 1317  BP: 125/60 (!) 123/55 (!) 116/59 (!) 133/59  Pulse: 91 98 86   Resp: 18 16  16   Temp: 98.7 F (37.1 C) 99.4 F (37.4 C) 98.7 F (37.1 C) 98.3 F (36.8 C)  TempSrc: Oral Oral Oral Oral  SpO2: 99% 99% 99% 97%  Weight:      Height:        Intake/Output Summary (Last 24 hours) at 06/07/16 1424 Last data filed at 06/07/16 0810  Gross per 24 hour  Intake          1556.67 ml  Output              900 ml  Net           656.67 ml   Filed Weights   06/06/16 1107  Weight: 79.1 kg (174 lb 6.1 oz)    Examination:  General exam: Appears calm and comfortable  Respiratory system: Clear to auscultation. Respiratory effort normal. Cardiovascular system: S1 & S2 heard, RRR Gastrointestinal system: Abdomen is nondistended, soft and nontender. No organomegaly or masses felt. Normal  bowel sounds heard. Central nervous system: Alert and oriented. No focal neurological deficits. Extremities: Symmetric 5 x 5 power. Skin: No rashes, lesions  Psychiatry: Judgement and insight appear normal. Mood & affect appropriate.   Data Reviewed: I have personally reviewed following labs and imaging studies  CBC:  Recent Labs Lab 06/06/16 0546 06/06/16 0950 06/06/16 1336 06/07/16 0544  WBC 12.5* 12.0*  --  9.8  HGB 10.8* 10.4* 9.4* 9.4*  HCT 30.5* 29.5* 27.0* 26.6*  MCV 83.1 83.3  --  83.6  PLT 172 180  --  [id number]   Basic Metabolic Panel:  Recent Labs Lab 06/06/16 0546 06/06/16 0950 06/07/16 0544  NA 133*  --  133*  K 3.4*  --  3.0*  CL 99*  --  100*  CO2  25  --  25  GLUCOSE 157*  --  150*  BUN 35*  --  25*  CREATININE 0.90 1.02 1.02  CALCIUM 8.4*  --  8.4*   GFR: Estimated Creatinine Clearance: 63.3 mL/min (by C-G formula based on SCr of 1.02 mg/dL). Liver Function Tests: No results for input(s): AST, ALT, ALKPHOS, BILITOT, PROT, ALBUMIN in the last 168 hours. No results for input(s): LIPASE, AMYLASE in the last 168 hours. No results for input(s): AMMONIA in the last 168 hours. Coagulation Profile: No results for input(s): INR, PROTIME in the last 168 hours. Cardiac Enzymes:  Recent Labs Lab 06/06/16 0546  TROPONINI <0.03   BNP (last 3 results) No results for input(s): PROBNP in the last 8760 hours. HbA1C: No results for input(s): HGBA1C in the last 72 hours. CBG: No results for input(s): GLUCAP in the last 168 hours. Lipid Profile: No results for input(s): CHOL, HDL, LDLCALC, TRIG, CHOLHDL, LDLDIRECT in the last 72 hours. Thyroid Function Tests:  Recent Labs  06/06/16 0546  TSH 0.397   Anemia Panel:  Recent Labs  06/06/16 0950  VITAMINB12 152*  FOLATE 10.1  FERRITIN 38  TIBC 304  IRON 108  RETICCTPCT 3.7*   Sepsis Labs: No results for input(s): PROCALCITON, LATICACIDVEN in the last 168 hours.  No results found for this or any previous visit (from the past 240 hour(s)).   Radiology Studies: No results found.  Scheduled Meds: . [MAR Hold] atorvastatin  10 mg Oral Daily  . [MAR Hold] brimonidine  1 drop Left Eye BID  . [MAR Hold] carvedilol  3.125 mg Oral BID WC  . [MAR Hold] dorzolamide-timolol  1 drop Left Eye BID  . [MAR Hold] ferrous sulfate  325 mg Oral BID WC  . [MAR Hold] pantoprazole (PROTONIX) IV  40 mg Intravenous Q12H  . [MAR Hold] polyethylene glycol  17 g Oral Daily  . [MAR Hold] senna  1 tablet Oral BID   Continuous Infusions: . dextrose 5 % and 0.45% NaCl 100 mL/hr at 06/07/16 0438     LOS: 0 days   CHIU, Orpah Melter, MD [facility] Pager [phone]  If 7PM-7AM, please  contact night-coverage [email or web address] Password [facility] 06/07/2016, 2:24 PM

## 2016-06-07 NOTE — Progress Notes (Signed)
Patient ID: Jerry Farrell, male   DOB: 08/24/1945, 71 y.o.   MRN: [id number]    Progress Note   Subjective   Feels ok- somewhat lightheaded with standing One black stool yesterday , none since HGb down to 9.4 yesterday pm, stable at 9.4 this am   Objective   Vital signs in last 24 hours: Temp:  [98.7 F (37.1 C)-99.4 F (37.4 C)] 98.7 F (37.1 C) (02/14 0511) Pulse Rate:  [81-98] 86 (02/14 0511) Resp:  [16-18] 16 (02/13 2152) BP: (108-130)/(54-65) 116/59 (02/14 0511) SpO2:  [99 %-100 %] 99 % (02/14 0511) Weight:  [174 lb 6.1 oz (79.1 kg)] 174 lb 6.1 oz (79.1 kg) (02/13 1107) Last BM Date: 06/06/16 General:    Older AA male in NAD, lips pale Heart:  Regular rate and rhythm; no murmurs Lungs: Respirations even and unlabored, lungs CTA bilaterally Abdomen:  Soft, nontender and nondistended. Normal bowel sounds. Extremities:  Without edema. Neurologic:  Alert and oriented,  grossly normal neurologically. Psych:  Cooperative. Normal mood and affect.  Intake/Output from previous day: 02/13 0701 - 02/14 0700 In: 1556.7 [P.O.:240; I.V.:1316.7] Out: 1000 [Urine:1000] Intake/Output this shift: Total I/O In: -  Out: 300 [Urine:300]  Lab Results:  Recent Labs  06/06/16 0546 06/06/16 0950 06/06/16 1336 06/07/16 0544  WBC 12.5* 12.0*  --  9.8  HGB 10.8* 10.4* 9.4* 9.4*  HCT 30.5* 29.5* 27.0* 26.6*  PLT 172 180  --  169   BMET  Recent Labs  06/06/16 0546 06/06/16 0950 06/07/16 0544  NA 133*  --  133*  K 3.4*  --  3.0*  CL 99*  --  100*  CO2 25  --  25  GLUCOSE 157*  --  150*  BUN 35*  --  25*  CREATININE 0.90 1.02 1.02  CALCIUM 8.4*  --  8.4*   LFT No results for input(s): PROT, ALBUMIN, AST, ALT, ALKPHOS, BILITOT, BILIDIR, IBILI in the last 72 hours. PT/INR No results for input(s): LABPROT, INR in the last 72 hours.  Studies/Results: No results found.   IMP/PLAN;  #1 71 yo AA male s/p esophageal dilation  on 06/01/16 -presenting with  weakness,dizziness,and 6 + gram drop in HGB  Consistent with GI bleed Suspect esophageal tear post dilation with persistent ooze past week .  He is hemodynamically  Stable, hgb 9.4 this am For EGD at 1 pm Continue IV PPI IM IMP IMP   Active Problems:   Essential hypertension   BPH (benign prostatic hyperplasia)   Orthostatic hypotension   Symptomatic anemia   Acute GI bleeding     LOS: 0 days   Merlin Ege  06/07/2016, 8:54 AM

## 2016-06-08 DIAGNOSIS — K921 Melena: Secondary | ICD-10-CM

## 2016-06-08 DIAGNOSIS — K250 Acute gastric ulcer with hemorrhage: Secondary | ICD-10-CM

## 2016-06-08 DIAGNOSIS — Z961 Presence of intraocular lens: Secondary | ICD-10-CM | POA: Diagnosis present

## 2016-06-08 DIAGNOSIS — Z79899 Other long term (current) drug therapy: Secondary | ICD-10-CM | POA: Diagnosis not present

## 2016-06-08 DIAGNOSIS — I10 Essential (primary) hypertension: Secondary | ICD-10-CM | POA: Diagnosis not present

## 2016-06-08 DIAGNOSIS — D649 Anemia, unspecified: Secondary | ICD-10-CM | POA: Diagnosis not present

## 2016-06-08 DIAGNOSIS — I2510 Atherosclerotic heart disease of native coronary artery without angina pectoris: Secondary | ICD-10-CM | POA: Diagnosis present

## 2016-06-08 DIAGNOSIS — K219 Gastro-esophageal reflux disease without esophagitis: Secondary | ICD-10-CM | POA: Diagnosis present

## 2016-06-08 DIAGNOSIS — N400 Enlarged prostate without lower urinary tract symptoms: Secondary | ICD-10-CM | POA: Diagnosis not present

## 2016-06-08 DIAGNOSIS — R002 Palpitations: Secondary | ICD-10-CM | POA: Diagnosis not present

## 2016-06-08 DIAGNOSIS — K2970 Gastritis, unspecified, without bleeding: Secondary | ICD-10-CM | POA: Diagnosis present

## 2016-06-08 DIAGNOSIS — Z9842 Cataract extraction status, left eye: Secondary | ICD-10-CM | POA: Diagnosis not present

## 2016-06-08 DIAGNOSIS — Z7982 Long term (current) use of aspirin: Secondary | ICD-10-CM | POA: Diagnosis not present

## 2016-06-08 DIAGNOSIS — Z951 Presence of aortocoronary bypass graft: Secondary | ICD-10-CM | POA: Diagnosis not present

## 2016-06-08 DIAGNOSIS — K8020 Calculus of gallbladder without cholecystitis without obstruction: Secondary | ICD-10-CM | POA: Diagnosis present

## 2016-06-08 DIAGNOSIS — D500 Iron deficiency anemia secondary to blood loss (chronic): Secondary | ICD-10-CM | POA: Diagnosis present

## 2016-06-08 DIAGNOSIS — E785 Hyperlipidemia, unspecified: Secondary | ICD-10-CM | POA: Diagnosis present

## 2016-06-08 DIAGNOSIS — I951 Orthostatic hypotension: Secondary | ICD-10-CM | POA: Diagnosis present

## 2016-06-08 DIAGNOSIS — Z86010 Personal history of colonic polyps: Secondary | ICD-10-CM | POA: Diagnosis not present

## 2016-06-08 DIAGNOSIS — K922 Gastrointestinal hemorrhage, unspecified: Secondary | ICD-10-CM | POA: Diagnosis not present

## 2016-06-08 LAB — CBC
HEMATOCRIT: 23.9 % — AB (ref 39.0–52.0)
Hemoglobin: 8.5 g/dL — ABNORMAL LOW (ref 13.0–17.0)
MCH: 30 pg (ref 26.0–34.0)
MCHC: 35.6 g/dL (ref 30.0–36.0)
MCV: 84.5 fL (ref 78.0–100.0)
PLATELETS: 151 10*3/uL (ref 150–400)
RBC: 2.83 MIL/uL — AB (ref 4.22–5.81)
RDW: 13.8 % (ref 11.5–15.5)
WBC: 8.2 10*3/uL (ref 4.0–10.5)

## 2016-06-08 LAB — HEMOGLOBIN AND HEMATOCRIT, BLOOD
HCT: 22.6 % — ABNORMAL LOW (ref 39.0–52.0)
HEMATOCRIT: 28.9 % — AB (ref 39.0–52.0)
HEMOGLOBIN: 8.1 g/dL — AB (ref 13.0–17.0)
HEMOGLOBIN: 10.2 g/dL — AB (ref 13.0–17.0)

## 2016-06-08 LAB — PREPARE RBC (CROSSMATCH)

## 2016-06-08 MED ORDER — SODIUM CHLORIDE 0.9 % IV SOLN
Freq: Once | INTRAVENOUS | Status: AC
  Administered 2016-06-08: 15:00:00 via INTRAVENOUS

## 2016-06-08 MED ORDER — ACETAMINOPHEN 325 MG PO TABS
650.0000 mg | ORAL_TABLET | Freq: Once | ORAL | Status: AC
  Administered 2016-06-08: 650 mg via ORAL
  Filled 2016-06-08: qty 2

## 2016-06-08 NOTE — Care Management Obs Status (Signed)
[facility] NOTIFICATION   Patient Details  Name: Jerry Farrell MRN: [id number] Date of Birth: Nov 14, 1945   Medicare Observation Status Notification Given:  Yes    MahabirJuliann Pulse, RN 06/08/2016, 12:57 PM

## 2016-06-08 NOTE — Progress Notes (Signed)
PROGRESS NOTE    Jerry Farrell  [id number] DOB: 01/07/1946 DOA: 06/06/2016 PCP: [name], MD    Brief Narrative:  71 y.o. male with medical history significant of CAD status post CABG, hypertension, BPH, and gastritis. She presented to the ED complaining of palpitations upon standing and dizziness. Patient reported symptoms worsened last night but hasn't been happening for over 2 months. Patient had cardiac workup for the same which so far has been negative. Also had an endoscopy on 06/03/2015 showing only gastritis. Patient reported after his endoscopy he had black stools, no mouth movement for the past 3 days. Patient reported that his dizziness is like he's going to pass out, and he happened every time that he goes from sitting to standing position. Denies chest pain, diaphoresis, and loss of consciousness.  Assessment & Plan:   Active Problems:   Essential hypertension   BPH (benign prostatic hyperplasia)   Orthostatic hypotension   Symptomatic anemia   Acute gastric ulcer with hemorrhage   Heart palpitations   Melena   Orthostatic hypotension  -Concerns for acute blood loss anemia - FOBT was positive - Patient is s/p endoscopy on Friday reportedly with findings notable for gastritis, no other acute process - Colonoscopy last performed on 01/2016 wtih polypectomy performed and findings of internal hemorrhoids - Stable overnight. No longer orthostatic  HTN - stable  - Will continue coreg as tolerated - Held Diovan given orthostatic hypotension at admission - Plan to resume home meds at discharge  Gastritis  - Pt has received Protonix 80 mg in ED  -Will continue Protonix 40 mg BID  - GI consulted. Pt s/p endoscopy on 2/14 with findings of gastric ulceration/tear  Anemia unknown etiology at this time - likely chronic blood loss and iron deficiency  - Started Iron supplementation - Hgb remains stable this AM - Endoscopy noted per above - 2 units PRBC's per GI -  Repeat CBC in AM  BPH  - Flomax on hold due to orthostatics  - Stable  CAD  - s/p CABG in 1994  - Pt is s/p recent nuclear stress on 03/2016 with EF 63% No MWA and low risk study - Stable without chest pain  DVT prophylaxis: SCD's Code Status: Full Family Communication: Pt in room, family not at bedside Disposition Plan: d/c home 2/16  Consultants:   GI  Procedures:     Antimicrobials: Anti-infectives    None      Subjective: Without complaints  Objective: Vitals:   06/08/16 0751 06/08/16 1511 06/08/16 1530 06/08/16 1600  BP: (!) 113/59 (!) 129/57 (!) 139/56 133/60  Pulse: 75 79 76 77  Resp:   18 18  Temp:  98.9 F (37.2 C) 99 F (37.2 C) 98.4 F (36.9 C)  TempSrc:  Oral Oral Oral  SpO2:  100% 100% 100%  Weight:      Height:        Intake/Output Summary (Last 24 hours) at 06/08/16 1609 Last data filed at 06/08/16 0753  Gross per 24 hour  Intake             1200 ml  Output             1000 ml  Net              200 ml   Filed Weights   06/06/16 1107  Weight: 79.1 kg (174 lb 6.1 oz)    Examination:  General exam: awake, laying in bed, in nad Respiratory system: normal  resp effort, no wheezing Cardiovascular system: regular rate, s1, s2 Gastrointestinal system: soft, nondistended, pos BS Central nervous system: cn2-12 grossly intact, strength intact Extremities: perfused, no clubbing Skin: no clubbing, no cyanosis  Psychiatry: mood normal// no visual hallucinations.   Data Reviewed: I have personally reviewed following labs and imaging studies  CBC:  Recent Labs Lab 06/06/16 0546 06/06/16 0950 06/06/16 1336 06/07/16 0544 06/07/16 1541 06/08/16 0519 06/08/16 1458  WBC 12.5* 12.0*  --  9.8  --  8.2  --   HGB 10.8* 10.4* 9.4* 9.4* 8.8* 8.5* 8.1*  HCT 30.5* 29.5* 27.0* 26.6* 24.8* 23.9* 22.6*  MCV 83.1 83.3  --  83.6  --  84.5  --   PLT 172 180  --  169  --  151  --    Basic Metabolic Panel:  Recent Labs Lab 06/06/16 0546  06/06/16 0950 06/07/16 0544  NA 133*  --  133*  K 3.4*  --  3.0*  CL 99*  --  100*  CO2 25  --  25  GLUCOSE 157*  --  150*  BUN 35*  --  25*  CREATININE 0.90 1.02 1.02  CALCIUM 8.4*  --  8.4*   GFR: Estimated Creatinine Clearance: 63.3 mL/min (by C-G formula based on SCr of 1.02 mg/dL). Liver Function Tests: No results for input(s): AST, ALT, ALKPHOS, BILITOT, PROT, ALBUMIN in the last 168 hours. No results for input(s): LIPASE, AMYLASE in the last 168 hours. No results for input(s): AMMONIA in the last 168 hours. Coagulation Profile: No results for input(s): INR, PROTIME in the last 168 hours. Cardiac Enzymes:  Recent Labs Lab 06/06/16 0546  TROPONINI <0.03   BNP (last 3 results) No results for input(s): PROBNP in the last 8760 hours. HbA1C: No results for input(s): HGBA1C in the last 72 hours. CBG: No results for input(s): GLUCAP in the last 168 hours. Lipid Profile: No results for input(s): CHOL, HDL, LDLCALC, TRIG, CHOLHDL, LDLDIRECT in the last 72 hours. Thyroid Function Tests:  Recent Labs  06/06/16 0546  TSH 0.397   Anemia Panel:  Recent Labs  06/06/16 0950  VITAMINB12 152*  FOLATE 10.1  FERRITIN 38  TIBC 304  IRON 108  RETICCTPCT 3.7*   Sepsis Labs: No results for input(s): PROCALCITON, LATICACIDVEN in the last 168 hours.  No results found for this or any previous visit (from the past 240 hour(s)).   Radiology Studies: No results found.  Scheduled Meds: . atorvastatin  10 mg Oral Daily  . brimonidine  1 drop Left Eye BID  . carvedilol  3.125 mg Oral BID WC  . dorzolamide-timolol  1 drop Left Eye BID  . ferrous sulfate  325 mg Oral BID WC  . pantoprazole (PROTONIX) IV  40 mg Intravenous Q12H  . polyethylene glycol  17 g Oral Daily  . senna  1 tablet Oral BID   Continuous Infusions: . dextrose 5 % and 0.45% NaCl 50 mL/hr at 06/08/16 1032     LOS: 0 days   CHIU, Orpah Melter, MD [facility] Pager [phone]  If 7PM-7AM,  please contact night-coverage [email or web address] Password [facility] 06/08/2016, 4:09 PM

## 2016-06-08 NOTE — Progress Notes (Signed)
Patient ID: Jerry Farrell, male   DOB: May 12, 1945, 71 y.o.   MRN: [id number]    Progress Note   Subjective  Still feels weak, a little lightheaded on standing.. No stools HGB down to 8.5 this am     Objective   Vital signs in last 24 hours: Temp:  [98.2 F (36.8 C)-98.9 F (37.2 C)] 98.3 F (36.8 C) (02/15 0514) Pulse Rate:  [74-84] 75 (02/15 0751) Resp:  [14-20] 19 (02/15 0514) BP: (90-133)/(43-59) 113/59 (02/15 0751) SpO2:  [97 %-100 %] 100 % (02/15 0514) Last BM Date: 06/06/16 General: AA male in NAD Heart:  Regular rate and rhythm; no murmurs Lungs: Respirations even and unlabored, lungs CTA bilaterally Abdomen:  Soft, nontender and nondistended. Normal bowel sounds. Extremities:  Without edema. Neurologic:  Alert and oriented,  grossly normal neurologically. Psych:  Cooperative. Normal mood and affect.  Intake/Output from previous day: 02/14 0701 - 02/15 0700 In: 1400 [I.V.:1400] Out: 1000 [Urine:1000] Intake/Output this shift: Total I/O In: -  Out: 300 [Urine:300]  Lab Results:  Recent Labs  06/06/16 0950  06/07/16 0544 06/07/16 1541 06/08/16 0519  WBC 12.0*  --  9.8  --  8.2  HGB 10.4*  < > 9.4* 8.8* 8.5*  HCT 29.5*  < > 26.6* 24.8* 23.9*  PLT 180  --  169  --  151  < > = values in this interval not displayed. BMET  Recent Labs  06/06/16 0546 06/06/16 0950 06/07/16 0544  NA 133*  --  133*  K 3.4*  --  3.0*  CL 99*  --  100*  CO2 25  --  25  GLUCOSE 157*  --  150*  BUN 35*  --  25*  CREATININE 0.90 1.02 1.02  CALCIUM 8.4*  --  8.4*   LFT No results for input(s): PROT, ALBUMIN, AST, ALT, ALKPHOS, BILITOT, BILIDIR, IBILI in the last 72 hours. PT/INR No results for input(s): LABPROT, INR in the last 72 hours.  Studies/Results: No results found.   IMP/PLAN;  #1 35 to AA male with GI bleed secondary to gastric ulceration/tear  Post esophageal dilation  06/01/16  He is s/p EGD with endoclipping of gastric ulcer/tear   Hgb has drifted  another gram ,and pt remains symptomatic He is 8 grams from his baseline Due to age , symptomatic state, hx CAD  Will proceed with transfusions today x2 . Discussed with pt and he is agreeable  Continue Full liquids Serial hgb's- if stabilizes post transfusions possible discharge home  Tomorrow of  Baby ASA x 2-3 weeks, on PPI daily IM  Active Problems:   Essential hypertension   BPH (benign prostatic hyperplasia)   Orthostatic hypotension   Symptomatic anemia   Acute GI bleeding   Heart palpitations     LOS: 0 days   Jerry Farrell  06/08/2016, 8:45 AM

## 2016-06-08 NOTE — Care Management Note (Signed)
Case Management Note  Patient Details  Name: KHYSEN LUHRS MRN: [id number] Date of Birth: [birth year]  Subjective/Objective: 3 yj/o m admitted w/orthostatic hypotension. From home.                   Action/Plan:d/c plan home.   Expected Discharge Date:   (unknown)               Expected Discharge Plan:  Home/Self Care  In-House Referral:     Discharge planning Services  CM Consult  Post Acute Care Choice:    Choice offered to:     DME Arranged:    DME Agency:     HH Arranged:    HH Agency:     Status of Service:  In process, will continue to follow  If discussed at Long Length of Stay Meetings, dates discussed:    Additional Comments:  Dessa Phi, RN 06/08/2016, 12:57 PM

## 2016-06-09 ENCOUNTER — Telehealth: Payer: Self-pay

## 2016-06-09 ENCOUNTER — Encounter (HOSPITAL_COMMUNITY): Payer: Self-pay | Admitting: Gastroenterology

## 2016-06-09 DIAGNOSIS — K921 Melena: Secondary | ICD-10-CM

## 2016-06-09 LAB — TYPE AND SCREEN
Blood Product Expiration Date: 201803162359
Blood Product Expiration Date: 201803192359
ISSUE DATE / TIME: 201802151535
ISSUE DATE / TIME: 201802151816
UNIT TYPE AND RH: 7300
Unit Type and Rh: 7300

## 2016-06-09 LAB — BASIC METABOLIC PANEL
Anion gap: 4 — ABNORMAL LOW (ref 5–15)
BUN: 8 mg/dL (ref 6–20)
CO2: 28 mmol/L (ref 22–32)
Calcium: 8.4 mg/dL — ABNORMAL LOW (ref 8.9–10.3)
Chloride: 106 mmol/L (ref 101–111)
Creatinine, Ser: 0.94 mg/dL (ref 0.61–1.24)
GFR calc Af Amer: >60 mL/min (ref >60)
Glucose, Bld: 105 mg/dL — ABNORMAL HIGH (ref 65–99)
Potassium: 4.2 mmol/L (ref 3.5–5.1)
SODIUM: 138 mmol/L (ref 135–145)

## 2016-06-09 LAB — CBC
HCT: 31 % — ABNORMAL LOW (ref 39.0–52.0)
Hemoglobin: 10.6 g/dL — ABNORMAL LOW (ref 13.0–17.0)
MCH: 28.6 pg (ref 26.0–34.0)
MCHC: 34.2 g/dL (ref 30.0–36.0)
MCV: 83.6 fL (ref 78.0–100.0)
PLATELETS: 137 10*3/uL — AB (ref 150–400)
RBC: 3.71 MIL/uL — ABNORMAL LOW (ref 4.22–5.81)
RDW: 13.8 % (ref 11.5–15.5)
WBC: 6.5 10*3/uL (ref 4.0–10.5)

## 2016-06-09 MED ORDER — PANTOPRAZOLE SODIUM 40 MG PO TBEC: 40.0000 mg | DELAYED_RELEASE_TABLET | Freq: Every day | ORAL | 0 refills | Status: DC

## 2016-06-09 MED ORDER — FERROUS SULFATE 325 (65 FE) MG PO TABS: 325.0000 mg | ORAL_TABLET | Freq: Two times a day (BID) | ORAL | 0 refills | Status: DC

## 2016-06-09 NOTE — Progress Notes (Signed)
Patient ID: KAMEN HOSTETLER, male   DOB: 09/19/1945, 71 y.o.   MRN: [id number]    Progress Note   Subjective   Feels much better after blood yesterday, no complaints, no further stools  HGB 10.6   Objective   Vital signs in last 24 hours: Temp:  [98.4 F (36.9 C)-99.3 F (37.4 C)] 98.8 F (37.1 C) (02/16 0650) Pulse Rate:  [61-84] 72 (02/16 0650) Resp:  [18-19] 19 (02/16 0650) BP: (117-145)/(51-61) 143/61 (02/16 0650) SpO2:  [98 %-100 %] 100 % (02/16 0650) Weight:  [176 lb 9.4 oz (80.1 kg)] 176 lb 9.4 oz (80.1 kg) (02/16 0650) Last BM Date: 06/08/16 General:    AA male  in NAD Heart:  Regular rate and rhythm; no murmurs Lungs: Respirations even and unlabored, lungs CTA bilaterally Abdomen:  Soft, nontender and nondistended. Normal bowel sounds. Extremities:  Without edema. Neurologic:  Alert and oriented,  grossly normal neurologically. Psych:  Cooperative. Normal mood and affect.  Intake/Output from previous day: 02/15 0701 - 02/16 0700 In: 1352.5 [I.V.:633.3; Blood:719.2] Out: 2275 [Urine:2275] Intake/Output this shift: Total I/O In: -  Out: 400 [Urine:400]  Lab Results:  Recent Labs  06/07/16 0544  06/08/16 0519 06/08/16 1458 06/08/16 2107 06/09/16 0525  WBC 9.8  --  8.2  --   --  6.5  HGB 9.4*  < > 8.5* 8.1* 10.2* 10.6*  HCT 26.6*  < > 23.9* 22.6* 28.9* 31.0*  PLT 169  --  151  --   --  137*  < > = values in this interval not displayed. BMET  Recent Labs  06/06/16 0950 06/07/16 0544 06/09/16 0525  NA  --  133* 138  K  --  3.0* 4.2  CL  --  100* 106  CO2  --  25 28  GLUCOSE  --  150* 105*  BUN  --  25* 8  CREATININE 1.02 1.02 0.94  CALCIUM  --  8.4* 8.4*   LFT No results for input(s): PROT, ALBUMIN, AST, ALT, ALKPHOS, BILITOT, BILIDIR, IBILI in the last 72 hours. PT/INR No results for input(s): LABPROT, INR in the last 72 hours.  Studies/Results: No results found.  IMP/PLAN;   #1 71 yo male  with acute GI bleed post esophageal dilation  on 06/01/16 secondary to gastric tear /ulceration Stable post EGD with endoclipping 2/14  #2 symptomatic anemia- secondary to blood loss- much improved post 2 unit transfusion yesterday   Plan; [place] for discharge today  Soft diet  hold baby ASA for another week  Continue BID PPI  X 2 weeks, th resume daily  Will plan to follow up in office next week , and repeat hgb . Office will call him later today with appt time with Nicoletta Ba PA-C   Active Problems:   Essential hypertension   BPH (benign prostatic hyperplasia)   Orthostatic hypotension   Symptomatic anemia   Acute gastric ulcer with hemorrhage   Heart palpitations   Melena     LOS: 1 day   Amy Esterwood  06/09/2016, 9:19 AM

## 2016-06-09 NOTE — Discharge Instructions (Signed)
°  Please continue to hold baby aspirin for one more week, then resume on 06/16/16

## 2016-06-09 NOTE — Care Management Note (Signed)
Case Management Note  Patient Details  Name: Jerry Farrell MRN: [id number] Date of Birth: 1946-02-13  Subjective/Objective:                    Action/Plan:d/c home.   Expected Discharge Date:  06/09/16               Expected Discharge Plan:  Home/Self Care  In-House Referral:     Discharge planning Services  CM Consult  Post Acute Care Choice:    Choice offered to:     DME Arranged:    DME Agency:     HH Arranged:    HH Agency:     Status of Service:  Completed, signed off  If discussed at [employer] of Stay Meetings, dates discussed:    Additional Comments:  Dessa Phi, RN 06/09/2016, 12:13 PM

## 2016-06-09 NOTE — Progress Notes (Signed)
Discharged home with wife instructions given acknowledged understanding. SRP, RN

## 2016-06-09 NOTE — Telephone Encounter (Signed)
Per Nicoletta Ba, PA-C the patient needs to be seen next week as well as have a STAT CBC. Left message at the preferred number for the patient to call back.

## 2016-06-09 NOTE — Discharge Summary (Signed)
Physician Discharge Summary  Jerry Farrell [id number] DOB: 1945-07-18 DOA: 06/06/2016  PCP: [name], MD  Admit date: 06/06/2016 Discharge date: 06/09/2016  Admitted From: Home Disposition:  Home  Recommendations for Outpatient Follow-up:  1. Follow up with PCP in 2-3 weeks 2. Follow up with GI as scheduled 3. Patient to continue to resume baby aspirin on 06/16/16 4. Repeat CBC in 1-2 weeks with GI  Discharge Condition:Stable CODE STATUS:Full Diet recommendation: Heart healthy   Brief/Interim Summary: 71 y.o.malewith medical history significant of CAD status post CABG, hypertension, BPH, and gastritis. She presented to the ED complaining of palpitations upon standing and dizziness. Patient reported symptoms worsened last night but hasn't been happening for over 2 months. Patient had cardiac workup for the same which so far has been negative. Also had an endoscopy on 06/03/2015 showing only gastritis. Patient reported after his endoscopy he had black stools, no mouth movement for the past 3days. Patient reported that his dizziness is like he's going to pass out, and he happened every time that he goes from sitting to standing position. Denies chest pain, diaphoresis, and loss of consciousness.  Orthostatic hypotension -Concerns for acute blood loss anemia - FOBT was positive - Resolved following blood transfusion  HTN - stable  - Continued coreg as tolerated - Held Diovan given orthostatic hypotension at admission - Plan to resume home meds at discharge  Gastritis  - Pt has received Protonix 80 mg in ED  -Will continue Protonix 40 mg BID x 2 weeks, then daily afterwards per GI recs - GI continued to follow. Pt s/p endoscopy on 2/14 with findings of gastric ulceration/tear - Patient required 2 units PRBC's on 2/15 with appropriate correction of hgb  Anemia unknown etiology at this time - likely chronic blood loss and iron deficiency  - Started Iron supplementation -  Hgb remains stable - Endoscopy noted per above  BPH - Flomax on hold due to orthostatics  - Stable  CAD - s/p CABG in 1994  - Pt is s/p recent nuclear stress on 03/2016 with EF 63% No MWA and low risk study - Stable without chest pain  Discharge Diagnoses:  Active Problems:   Essential hypertension   BPH (benign prostatic hyperplasia)   Orthostatic hypotension   Symptomatic anemia   Acute gastric ulcer with hemorrhage   Heart palpitations   Melena    Discharge Instructions   Allergies as of 06/09/2016   No Known Allergies     Medication List    STOP taking these medications   omeprazole 40 MG capsule Commonly known as:  PRILOSEC     TAKE these medications   aspirin EC 81 MG tablet Take 1 tablet (81 mg total) by mouth daily.   atorvastatin 10 MG tablet Commonly known as:  LIPITOR TAKE 1 TABLET BY MOUTH EVERY DAY   brimonidine 0.2 % ophthalmic solution Commonly known as:  ALPHAGAN INSTILL 1 DROP IN LEFT EYE TWICE DAILY   carvedilol 3.125 MG tablet Commonly known as:  COREG Take 1 tablet (3.125 mg total) by mouth 2 (two) times daily with a meal.   dorzolamide-timolol 22.3-6.8 MG/ML ophthalmic solution Commonly known as:  COSOPT Place 1 drop into the left eye 2 (two) times daily. Use as directed   ferrous sulfate 325 (65 FE) MG tablet Take 1 tablet (325 mg total) by mouth 2 (two) times daily with a meal.   pantoprazole 40 MG tablet Commonly known as:  PROTONIX Take 1 tablet (40 mg total) by  mouth daily. Take one tablet twice daily for two weeks, then one tablet daily afterwards   tamsulosin 0.4 MG Caps capsule Commonly known as:  FLOMAX TAKE 1 CAPSULE (0.4 MG TOTAL) BY MOUTH DAILY.   valsartan-hydrochlorothiazide 160-25 MG tablet Commonly known as:  DIOVAN-HCT Take 1 tablet by mouth daily.      Follow-up Information    Quay Burow, MD Follow up.   Specialties:  Cardiology, Radiology Contact information: [address] [place] [address] [place], [place]. Schedule an appointment as soon as possible for a visit in 2 week(s).   Specialties:  Family Medicine, Radiology Contact information: [place] [address] [phone]        Harl Bowie, MD Follow up.   Specialty:  Gastroenterology Why:  Follow up as scheduled Contact information: [place] [address] [phone]          No Known Allergies  Consultations:  GI  Procedures/Studies: No results found.  Subjective: Eager to go home  Discharge Exam: Vitals:   06/08/16 2100 06/09/16 0650  BP: (!) 117/51 (!) 143/61  Pulse: 66 72  Resp: 18 19  Temp: 98.4 F (36.9 C) 98.8 F (37.1 C)   Vitals:   06/08/16 1840 06/08/16 1951 06/08/16 2100 06/09/16 0650  BP: (!) 135/57 (!) 145/57 (!) 117/51 (!) 143/61  Pulse: 61 84 66 72  Resp: 18 19 18 19   Temp: 98.4 F (36.9 C) 98.4 F (36.9 C) 98.4 F (36.9 C) 98.8 F (37.1 C)  TempSrc: Oral Oral Oral Oral  SpO2: 100% 99% 98% 100%  Weight:    80.1 kg (176 lb 9.4 oz)  Height:        General: Pt is alert, awake, not in acute distress Cardiovascular: RRR, S1/S2 +, no rubs, no gallops Respiratory: CTA bilaterally, no wheezing, no rhonchi Abdominal: Soft, NT, ND, bowel sounds +` Extremities: no edema, no cyanosis   The results of significant diagnostics from this hospitalization (including imaging, microbiology, ancillary and laboratory) are listed below for reference.     Microbiology: No results found for this or any previous visit (from the past 240 hour(s)).   Labs: BNP (last 3 results)  Recent Labs  04/09/16 1012  BNP [id number]   Basic Metabolic Panel:  Recent Labs Lab 06/06/16 0546 06/06/16 0950 06/07/16 0544 06/09/16 0525  NA 133*  --  133* 138  K 3.4*  --  3.0* 4.2  CL 99*  --  100* 106  CO2 25  --  25 28  GLUCOSE 157*  --  150* 105*  BUN 35*  --  25* 8  CREATININE 0.90 1.02 1.02 0.94   CALCIUM 8.4*  --  8.4* 8.4*   Liver Function Tests: No results for input(s): AST, ALT, ALKPHOS, BILITOT, PROT, ALBUMIN in the last 168 hours. No results for input(s): LIPASE, AMYLASE in the last 168 hours. No results for input(s): AMMONIA in the last 168 hours. CBC:  Recent Labs Lab 06/06/16 0546 06/06/16 0950  06/07/16 0544 06/07/16 1541 06/08/16 0519 06/08/16 1458 06/08/16 2107 06/09/16 0525  WBC 12.5* 12.0*  --  9.8  --  8.2  --   --  6.5  HGB 10.8* 10.4*  < > 9.4* 8.8* 8.5* 8.1* 10.2* 10.6*  HCT 30.5* 29.5*  < > 26.6* 24.8* 23.9* 22.6* 28.9* 31.0*  MCV 83.1 83.3  --  83.6  --  84.5  --   --  83.6  PLT 172 180  --  169  --  151  --   --  137*  < > = values in this interval not displayed. Cardiac Enzymes:  Recent Labs Lab 06/06/16 0546  TROPONINI <0.03   BNP: Invalid input(s): POCBNP CBG: No results for input(s): GLUCAP in the last 168 hours. D-Dimer No results for input(s): DDIMER in the last 72 hours. Hgb A1c No results for input(s): HGBA1C in the last 72 hours. Lipid Profile No results for input(s): CHOL, HDL, LDLCALC, TRIG, CHOLHDL, LDLDIRECT in the last 72 hours. Thyroid function studies No results for input(s): TSH, T4TOTAL, T3FREE, THYROIDAB in the last 72 hours.  Invalid input(s): FREET3 Anemia work up No results for input(s): VITAMINB12, FOLATE, FERRITIN, TIBC, IRON, RETICCTPCT in the last 72 hours. Urinalysis    Component Value Date/Time   BILIRUBINUR neg. 04/29/2014 1534   PROTEINUR 30+ 04/29/2014 1534   UROBILINOGEN 0.2 04/29/2014 1534   NITRITE neg. 04/29/2014 1534   LEUKOCYTESUR Negative 04/29/2014 1534   Sepsis Labs Invalid input(s): PROCALCITONIN,  WBC,  LACTICIDVEN Microbiology No results found for this or any previous visit (from the past 240 hour(s)).   SIGNED:   Donne Hazel, MD  [facility] 06/09/2016, 11:43 AM  If 7PM-7AM, please contact night-coverage [email or web address] Password [email or web address]

## 2016-06-12 ENCOUNTER — Telehealth: Payer: Self-pay | Admitting: *Deleted

## 2016-06-12 NOTE — Telephone Encounter (Signed)
Jerry Farrell [id number] DOB: 05/26/1945 DOA: 06/06/2016  PCP: [name], MD  Admit date: 06/06/2016 Discharge date: 06/09/2016  Admitted From: Home Disposition:  Home  Recommendations for Outpatient Follow-up:  1. Follow up with PCP in 2-3 weeks 2. Follow up with GI as scheduled 3. Patient to continue to resume baby aspirin on 06/16/16 4. Repeat CBC in 1-2 weeks with GI  Discharge Condition:Stable CODE STATUS:Full Diet recommendation: Heart healthy   Transition Care Management Follow-up Telephone Call    How have you been since you were released from the hospital? "doing pretty good"   Do you understand why you were in the hospital? yes   Do you understand the discharge instructions? yes   Where were you discharged to? Home with wife   Items Reviewed:  Medications reviewed: yes  Allergies reviewed: yes  Dietary changes reviewed: yes  Referrals reviewed: yes   Functional Questionnaire:   Activities of Daily Living (ADLs):   He states they are independent in the following: ambulation, bathing and hygiene, feeding, continence, grooming, toileting and dressing States they require assistance with the following: N/A   Any transportation issues/concerns?: no   Any patient concerns? no   Confirmed importance and date/time of follow-up visits scheduled yes  Provider Appointment booked with Dr.Tower 06/14/16 @1115 .  Confirmed with patient if condition begins to worsen call PCP or go to the ER.  Patient was given the office number and encouraged to call back with question or concerns.  : yes

## 2016-06-13 NOTE — Telephone Encounter (Signed)
Patient confirmed appointment.

## 2016-06-14 ENCOUNTER — Ambulatory Visit (INDEPENDENT_AMBULATORY_CARE_PROVIDER_SITE_OTHER): Payer: Medicare Other | Admitting: Family Medicine

## 2016-06-14 ENCOUNTER — Encounter: Payer: Self-pay | Admitting: Family Medicine

## 2016-06-14 VITALS — BP 122/60 | HR 65 | Temp 98.4°F | Ht 68.0 in | Wt 176.8 lb

## 2016-06-14 DIAGNOSIS — K921 Melena: Secondary | ICD-10-CM

## 2016-06-14 DIAGNOSIS — I2510 Atherosclerotic heart disease of native coronary artery without angina pectoris: Secondary | ICD-10-CM

## 2016-06-14 DIAGNOSIS — I10 Essential (primary) hypertension: Secondary | ICD-10-CM | POA: Diagnosis not present

## 2016-06-14 DIAGNOSIS — K922 Gastrointestinal hemorrhage, unspecified: Secondary | ICD-10-CM

## 2016-06-14 DIAGNOSIS — K279 Peptic ulcer, site unspecified, unspecified as acute or chronic, without hemorrhage or perforation: Secondary | ICD-10-CM | POA: Diagnosis not present

## 2016-06-14 DIAGNOSIS — I951 Orthostatic hypotension: Secondary | ICD-10-CM

## 2016-06-14 DIAGNOSIS — E538 Deficiency of other specified B group vitamins: Secondary | ICD-10-CM | POA: Diagnosis not present

## 2016-06-14 DIAGNOSIS — D500 Iron deficiency anemia secondary to blood loss (chronic): Secondary | ICD-10-CM | POA: Diagnosis not present

## 2016-06-14 DIAGNOSIS — D509 Iron deficiency anemia, unspecified: Secondary | ICD-10-CM | POA: Insufficient documentation

## 2016-06-14 LAB — CBC WITH DIFFERENTIAL/PLATELET
Basophils Absolute: 0.1 10*3/uL (ref 0.0–0.1)
Basophils Relative: 0.7 % (ref 0.0–3.0)
EOS ABS: 0 10*3/uL (ref 0.0–0.7)
Eosinophils Relative: 0.6 % (ref 0.0–5.0)
HCT: 36.7 % — ABNORMAL LOW (ref 39.0–52.0)
HEMOGLOBIN: 12.5 g/dL — AB (ref 13.0–17.0)
LYMPHS ABS: 1.2 10*3/uL (ref 0.7–4.0)
Lymphocytes Relative: 15.8 % (ref 12.0–46.0)
MCHC: 34 g/dL (ref 30.0–36.0)
MCV: 87.3 fl (ref 78.0–100.0)
MONO ABS: 0.6 10*3/uL (ref 0.1–1.0)
Monocytes Relative: 7.5 % (ref 3.0–12.0)
NEUTROS PCT: 75.4 % (ref 43.0–77.0)
Neutro Abs: 5.9 10*3/uL (ref 1.4–7.7)
Platelets: 231 10*3/uL (ref 150.0–400.0)
RBC: 4.2 Mil/uL — AB (ref 4.22–5.81)
RDW: 14.4 % (ref 11.5–15.5)
WBC: 7.8 10*3/uL (ref 4.0–10.5)

## 2016-06-14 MED ORDER — CYANOCOBALAMIN 1000 MCG/ML IJ SOLN
1000.0000 ug | Freq: Once | INTRAMUSCULAR | Status: AC
  Administered 2016-06-14: 1000 ug via INTRAMUSCULAR

## 2016-06-14 NOTE — Assessment & Plan Note (Addendum)
From upper gi bleed/gastric ulcer  2 U transfusion in hospital  Improved- re check cbc today  On ferrous sulfate bid - enc miralax for constipation F/u GI as planned

## 2016-06-14 NOTE — Progress Notes (Signed)
Subjective:    Patient ID: Jerry Farrell, male    DOB: 19-Oct-1945, 71 y.o.   MRN: [id number]  HPI Here for f/u of hospitalization from 2/13 to 06/09/16 for upper GI bleed  He presented with dizziness/weakness and pre syncope and his fobt was pos for blood  egd 2/14 showed gastric ulceration/tear He was started on protonix and continues 40 mg bid  req 2 u PRBC on 2/15 and tolerated it well  Hx of CAD- holding his asa until he sees GI on Friday  Also ferrous sulfate 325 mg bid   Labs were improved at d/c Results for orders placed or performed during the hospital encounter of 06/06/16  CBC  Result Value Ref Range   WBC 12.5 (H) 4.0 - 10.5 K/uL   RBC 3.67 (L) 4.22 - 5.81 MIL/uL   Hemoglobin 10.8 (L) 13.0 - 17.0 g/dL   HCT 30.5 (L) 39.0 - 52.0 %   MCV 83.1 78.0 - 100.0 fL   MCH 29.4 26.0 - 34.0 pg   MCHC 35.4 30.0 - 36.0 g/dL   RDW 13.2 11.5 - 15.5 %   Platelets 172 150 - 400 K/uL  Basic metabolic panel  Result Value Ref Range   Sodium 133 (L) 135 - 145 mmol/L   Potassium 3.4 (L) 3.5 - 5.1 mmol/L   Chloride 99 (L) 101 - 111 mmol/L   CO2 25 22 - 32 mmol/L   Glucose, Bld 157 (H) 65 - 99 mg/dL   BUN 35 (H) 6 - 20 mg/dL   Creatinine, Ser 0.90 0.61 - 1.24 mg/dL   Calcium 8.4 (L) 8.9 - 10.3 mg/dL   GFR calc non Af Amer >60 >60 mL/min   GFR calc Af Amer >60 >60 mL/min   Anion gap 9 5 - 15  TSH  Result Value Ref Range   TSH 0.397 0.350 - 4.500 uIU/mL  Troponin I  Result Value Ref Range   Troponin I <0.03 <0.03 ng/mL  CBC  Result Value Ref Range   WBC 12.0 (H) 4.0 - 10.5 K/uL   RBC 3.54 (L) 4.22 - 5.81 MIL/uL   Hemoglobin 10.4 (L) 13.0 - 17.0 g/dL   HCT 29.5 (L) 39.0 - 52.0 %   MCV 83.3 78.0 - 100.0 fL   MCH 29.4 26.0 - 34.0 pg   MCHC 35.3 30.0 - 36.0 g/dL   RDW 13.1 11.5 - 15.5 %   Platelets 180 150 - 400 K/uL  Creatinine, serum  Result Value Ref Range   Creatinine, Ser 1.02 0.61 - 1.24 mg/dL   GFR calc non Af Amer >60 >60 mL/min   GFR calc Af Amer >60 >60 mL/min    Vitamin B12  Result Value Ref Range   Vitamin B-12 152 (L) 180 - 914 pg/mL  Folate  Result Value Ref Range   Folate 10.1 >5.9 ng/mL  Iron and TIBC  Result Value Ref Range   Iron 108 45 - 182 ug/dL   TIBC 304 250 - 450 ug/dL   Saturation Ratios 36 17.9 - 39.5 %   UIBC 196 ug/dL  Ferritin  Result Value Ref Range   Ferritin 38 24 - 336 ng/mL  Reticulocytes  Result Value Ref Range   Retic Ct Pct 3.7 (H) 0.4 - 3.1 %   RBC. 3.54 (L) 4.22 - 5.81 MIL/uL   Retic Count, Manual 131.0 19.0 - 186.0 K/uL  Hemoglobin and hematocrit, blood  Result Value Ref Range   Hemoglobin 9.4 (L) 13.0 - 17.0  g/dL   HCT 27.0 (L) 39.0 - [id number] %  Basic metabolic panel  Result Value Ref Range   Sodium 133 (L) 135 - 145 mmol/L   Potassium 3.0 (L) 3.5 - 5.1 mmol/L   Chloride 100 (L) 101 - 111 mmol/L   CO2 25 22 - 32 mmol/L   Glucose, Bld 150 (H) 65 - 99 mg/dL   BUN 25 (H) 6 - 20 mg/dL   Creatinine, Ser 1.02 0.61 - 1.24 mg/dL   Calcium 8.4 (L) 8.9 - 10.3 mg/dL   GFR calc non Af Amer >60 >60 mL/min   GFR calc Af Amer >60 >60 mL/min   Anion gap 8 5 - 15  CBC  Result Value Ref Range   WBC 9.8 4.0 - 10.5 K/uL   RBC 3.18 (L) 4.22 - 5.81 MIL/uL   Hemoglobin 9.4 (L) 13.0 - 17.0 g/dL   HCT 26.6 (L) 39.0 - 52.0 %   MCV 83.6 78.0 - 100.0 fL   MCH 29.6 26.0 - 34.0 pg   MCHC 35.3 30.0 - 36.0 g/dL   RDW 13.5 11.5 - 15.5 %   Platelets 169 150 - 400 K/uL  Hemoglobin and hematocrit, blood  Result Value Ref Range   Hemoglobin 8.8 (L) 13.0 - 17.0 g/dL   HCT 24.8 (L) 39.0 - 52.0 %  CBC  Result Value Ref Range   WBC 8.2 4.0 - 10.5 K/uL   RBC 2.83 (L) 4.22 - 5.81 MIL/uL   Hemoglobin 8.5 (L) 13.0 - 17.0 g/dL   HCT 23.9 (L) 39.0 - 52.0 %   MCV 84.5 78.0 - 100.0 fL   MCH 30.0 26.0 - 34.0 pg   MCHC 35.6 30.0 - 36.0 g/dL   RDW 13.8 11.5 - 15.5 %   Platelets 151 150 - 400 K/uL  Hemoglobin and hematocrit, blood  Result Value Ref Range   Hemoglobin 8.1 (L) 13.0 - 17.0 g/dL   HCT 22.6 (L) 39.0 - 52.0 %  Hemoglobin  and hematocrit, blood  Result Value Ref Range   Hemoglobin 10.2 (L) 13.0 - 17.0 g/dL   HCT 28.9 (L) 39.0 - [id number] %  Basic metabolic panel  Result Value Ref Range   Sodium 138 135 - 145 mmol/L   Potassium 4.2 3.5 - 5.1 mmol/L   Chloride 106 101 - 111 mmol/L   CO2 28 22 - 32 mmol/L   Glucose, Bld 105 (H) 65 - 99 mg/dL   BUN 8 6 - 20 mg/dL   Creatinine, Ser 0.94 0.61 - 1.24 mg/dL   Calcium 8.4 (L) 8.9 - 10.3 mg/dL   GFR calc non Af Amer >60 >60 mL/min   GFR calc Af Amer >60 >60 mL/min   Anion gap 4 (L) 5 - 15  CBC  Result Value Ref Range   WBC 6.5 4.0 - 10.5 K/uL   RBC 3.71 (L) 4.22 - 5.81 MIL/uL   Hemoglobin 10.6 (L) 13.0 - 17.0 g/dL   HCT 31.0 (L) 39.0 - 52.0 %   MCV 83.6 78.0 - 100.0 fL   MCH 28.6 26.0 - 34.0 pg   MCHC 34.2 30.0 - 36.0 g/dL   RDW 13.8 11.5 - 15.5 %   Platelets 137 (L) 150 - 400 K/uL  POC occult blood, ED Provider will collect  Result Value Ref Range   Fecal Occult Bld POSITIVE (A) NEGATIVE  Type and screen  Result Value Ref Range   ISSUE DATE / TIME [id number]    Blood Product Unit Number [id number]  PRODUCT CODE [id number]    Unit Type and Rh 7300    Blood Product Expiration Date [id number]    ISSUE DATE / TIME [id number]    Blood Product Unit Number [id number]    Unit Type and Rh 7300    Blood Product Expiration Date [id number]   ABO/Rh  Result Value Ref Range   ABO/RH(D) B POS   Prepare RBC  Result Value Ref Range   Order Confirmation ORDER PROCESSED BY BLOOD BANK     Hb of 10.6 up from a low of 8.1 before transfusion   Has GI appt on 06/16/16 planned   Vitals are re assuring today  BP Readings from Last 3 Encounters:  06/14/16 122/60  06/09/16 (!) 143/61  06/02/16 131/80   Pulse Readings from Last 3 Encounters:  06/14/16 65  06/09/16 72  06/02/16 62   Wt Readings from Last 3 Encounters:  06/14/16 176 lb 12 oz (80.2 kg)  06/09/16 176 lb 9.4 oz (80.1 kg)  06/02/16 179 lb 9.6 oz (81.5 kg)    He still feels tired and  somewhat weak  Stools are still dark-on iron Marcelina Morel constipated  Taking miralax for that   No abdominal pain - feels pretty good  Appetite is ok - he is getting back to regular eating   B12 is also low  Was supplemented  For a shot today  Patient Active Problem List   Diagnosis Date Noted  . Peptic ulcer disease 06/14/2016  . Iron deficiency anemia 06/14/2016  . Vitamin B12 deficiency 06/14/2016  . Heart palpitations   . Symptomatic anemia 06/06/2016  . Acute GI bleeding   . Hyperglycemia 01/31/2016  . Need for hepatitis C screening test 01/25/2016  . Post-nasal drip 12/10/2015  . GERD (gastroesophageal reflux disease) 07/20/2015  . Routine general medical examination at a health care facility 10/28/2014  . Elevated alkaline phosphatase level 10/28/2014  . Urinary frequency 04/29/2014  . BPH (benign prostatic hyperplasia) 04/29/2014  . Prostate cancer screening 08/11/2013  . Encounter for Medicare annual wellness exam 08/11/2013  . TRANSAMINASES, SERUM, ELEVATED 04/05/2010  . Hyperlipidemia 01/09/2007  . ERECTILE DYSFUNCTION 01/09/2007  . Essential hypertension 01/09/2007  . Coronary atherosclerosis 01/09/2007  . ESOPHAGITIS 01/09/2007   Past Medical History:  Diagnosis Date  . BPH (benign prostatic hyperplasia)   . CAD (coronary artery disease)    cardiolite ok 3/05, stress test- low risk study 11/06  . Chronic low back pain    with radiculopathy  . ED (erectile dysfunction)   . Gallstones    abd [place]- stable hamangioma, gallbladder sludge and tiny stones 09/2003  . GERD (gastroesophageal reflux disease)   . Hyperlipidemia   . Hypertension   . Transfusion history    Past Surgical History:  Procedure Laterality Date  . CATARACT EXTRACTION W/PHACO  03/13/2012   Procedure: CATARACT EXTRACTION PHACO AND INTRAOCULAR LENS PLACEMENT (IOC);  Surgeon: Adonis Brook, MD;  Location: [place];  Service: Ophthalmology;  Laterality: Left;  . CORONARY ARTERY BYPASS GRAFT  1998   x 1   done [facility]  . ESOPHAGOGASTRODUODENOSCOPY (EGD) WITH PROPOFOL N/A 06/07/2016   Procedure: ESOPHAGOGASTRODUODENOSCOPY (EGD) WITH PROPOFOL;  Surgeon: Mauri Pole, MD;  Location: [facility];  Service: Endoscopy;  Laterality: N/A;  . GASTRECTOMY     partial  . MEMBRANE PEEL  03/13/2012   Procedure: MEMBRANE PEEL;  Surgeon: Adonis Brook, MD;  Location: [facility];  Service: Ophthalmology;  Laterality: Left;  . PARS PLANA VITRECTOMY  03/13/2012  Procedure: PARS PLANA VITRECTOMY WITH 23 GAUGE;  Surgeon: Adonis Brook, MD;  Location: [place];  Service: Ophthalmology;  Laterality: Left;   Social History  Substance Use Topics  . Smoking status: Never Smoker  . Smokeless tobacco: Never Used  . Alcohol use No   Family History  Problem Relation Age of Onset  . Cancer Sister     unknown type, pt feels alcohol related  . Cancer Brother     colon  . Cancer Brother     throat, pt feels alcohol related  . Colon cancer Brother     half brother  . Hypertension Mother    No Known Allergies Current Outpatient Prescriptions on File Prior to Visit  Medication Sig Dispense Refill  . atorvastatin (LIPITOR) 10 MG tablet TAKE 1 TABLET BY MOUTH EVERY DAY 90 tablet 2  . brimonidine (ALPHAGAN) 0.2 % ophthalmic solution INSTILL 1 DROP IN LEFT EYE TWICE DAILY  0  . carvedilol (COREG) 3.125 MG tablet Take 1 tablet (3.125 mg total) by mouth 2 (two) times daily with a meal. 180 tablet 3  . dorzolamide-timolol (COSOPT) 22.3-6.8 MG/ML ophthalmic solution Place 1 drop into the left eye 2 (two) times daily. Use as directed     . ferrous sulfate 325 (65 FE) MG tablet Take 1 tablet (325 mg total) by mouth 2 (two) times daily with a meal. 30 tablet 0  . pantoprazole (PROTONIX) 40 MG tablet Take 1 tablet (40 mg total) by mouth daily. Take one tablet twice daily for two weeks, then one tablet daily afterwards 60 tablet 0  . tamsulosin (FLOMAX) 0.4 MG CAPS capsule TAKE 1 CAPSULE (0.4 MG TOTAL) BY MOUTH DAILY. 90  capsule 1  . valsartan-hydrochlorothiazide (DIOVAN-HCT) 160-25 MG tablet Take 1 tablet by mouth daily. 30 tablet 5  . aspirin EC 81 MG tablet Take 1 tablet (81 mg total) by mouth daily. (Patient not taking: Reported on 06/14/2016) 90 tablet 3   No current facility-administered medications on file prior to visit.     Review of Systems Review of Systems  Constitutional: Negative for fever, appetite change,  and unexpected weight change. pos for fatigue which is improving  Eyes: Negative for pain and visual disturbance.  Respiratory: Negative for cough and shortness of breath.   Cardiovascular: Negative for cp or palpitations    Gastrointestinal: Negative for nausea, diarrhea and pos for constipation. neg for abd pain  Genitourinary: Negative for urgency and frequency.  Skin: Negative for pallor or rash   Neurological: Negative for weakness, light-headedness, numbness and headaches.  Hematological: Negative for adenopathy. Does not bruise/bleed easily.  Psychiatric/Behavioral: Negative for dysphoric mood. The patient is not nervous/anxious.         Objective:   Physical Exam  Constitutional: He appears well-developed and well-nourished. No distress.  HENT:  Head: Normocephalic and atraumatic.  Mouth/Throat: Oropharynx is clear and moist. No oropharyngeal exudate.  Eyes: Conjunctivae and EOM are normal. Pupils are equal, round, and reactive to light. Right eye exhibits no discharge. Left eye exhibits no discharge.  Neck: Normal range of motion. Neck supple.  Cardiovascular: Normal rate and regular rhythm.   Pulmonary/Chest: Effort normal and breath sounds normal. No respiratory distress. He has no wheezes. He has no rales.  Abdominal: Soft. Bowel sounds are normal. He exhibits no distension and no mass. There is no tenderness. There is no rebound and no guarding.  Baseline scar  Lymphadenopathy:    He has no cervical adenopathy.  Neurological: He is alert. No  cranial nerve deficit.    Skin: Skin is warm and dry. No rash noted. No pallor.  Psychiatric: He has a normal mood and affect.          Assessment & Plan:   Problem List Items Addressed This Visit      Cardiovascular and Mediastinum   Coronary atherosclerosis    Recent upper gi bleed  Pt will be cleared to re start asa after seeing GI friday      Essential hypertension    Stable control on current medications out of the hospital No longer orthostatic BP: 122/60        RESOLVED: Orthostatic hypotension - Primary    Resolved after hosp/hydration/tx of gi bleed         Digestive   Acute GI bleeding    No longer active See HPI- hospital course followed and reviewed in detail  Continue ppi bid F/u with GI Continue iron       Relevant Orders   CBC with Differential/Platelet (Completed)   RESOLVED: Melena    Resolved with tx of UGI bleed in hospital  Now stools are dark from iron  Cbc today      Peptic ulcer disease    In pt with hx of partial gastrectomy in the past  Rev hosp notes/studies and labs- see HPI Much improved with bid protonix/getting strength back and treating iron def anemia and low B12 B12 shot today  Continue current meds  Alert [place] if symptoms return  F/u with GI as planned        Other   Iron deficiency anemia    From upper gi bleed/gastric ulcer  2 U transfusion in hospital  Improved- re check cbc today  On ferrous sulfate bid - enc miralax for constipation F/u GI as planned       Relevant Medications   cyanocobalamin ((VITAMIN B-12)) injection 1,000 mcg (Completed)   Other Relevant Orders   CBC with Differential/Platelet (Completed)   Vitamin B12 deficiency    Lab Results  Component Value Date   VITAMINB12 152 (L) 06/06/2016   in hospital Suspect poor abs with hx of gastrectomy  Was tx in hospital  Also B12 shot today  Will follwo      Relevant Medications   cyanocobalamin ((VITAMIN B-12)) injection 1,000 mcg (Completed)

## 2016-06-14 NOTE — Assessment & Plan Note (Signed)
No longer active See HPI- hospital course followed and reviewed in detail  Continue ppi bid F/u with GI Continue iron

## 2016-06-14 NOTE — Assessment & Plan Note (Signed)
Resolved with tx of UGI bleed in hospital  Now stools are dark from iron  Cbc today

## 2016-06-14 NOTE — Assessment & Plan Note (Signed)
Lab Results  Component Value Date   VITAMINB12 152 (L) 06/06/2016   in hospital Suspect poor abs with hx of gastrectomy  Was tx in hospital  Also B12 shot today  Will follwo

## 2016-06-14 NOTE — Assessment & Plan Note (Signed)
Stable control on current medications out of the hospital No longer orthostatic BP: 122/60

## 2016-06-14 NOTE — Assessment & Plan Note (Signed)
In pt with hx of partial gastrectomy in the past  Rev hosp notes/studies and labs- see HPI Much improved with bid protonix/getting strength back and treating iron def anemia and low B12 B12 shot today  Continue current meds  Alert [place] if symptoms return  F/u with GI as planned

## 2016-06-14 NOTE — Assessment & Plan Note (Signed)
Recent upper gi bleed  Pt will be cleared to re start asa after seeing GI friday

## 2016-06-14 NOTE — Progress Notes (Signed)
Pre visit review using our clinic review tool, if applicable. No additional management support is needed unless otherwise documented below in the visit note. 

## 2016-06-14 NOTE — Patient Instructions (Addendum)
Glad you are feeling better  B12 shot today  Also labs for your blood count  See GI as planned  Stay on current medicine Use miralax for the constipation from the iron   Schedule next labs for 1 month for B12 and cbc   If symptoms suddenly return let us know

## 2016-06-14 NOTE — Assessment & Plan Note (Signed)
Resolved after hosp/hydration/tx of gi bleed

## 2016-06-16 ENCOUNTER — Ambulatory Visit (INDEPENDENT_AMBULATORY_CARE_PROVIDER_SITE_OTHER): Payer: Medicare Other | Admitting: Physician Assistant

## 2016-06-16 ENCOUNTER — Encounter: Payer: Self-pay | Admitting: Physician Assistant

## 2016-06-16 VITALS — BP 110/70 | HR 82 | Ht 68.0 in | Wt 177.0 lb

## 2016-06-16 DIAGNOSIS — D62 Acute posthemorrhagic anemia: Secondary | ICD-10-CM | POA: Diagnosis not present

## 2016-06-16 DIAGNOSIS — K254 Chronic or unspecified gastric ulcer with hemorrhage: Secondary | ICD-10-CM

## 2016-06-16 MED ORDER — BIS SUBCIT-METRONID-TETRACYC 140-125-125 MG PO CAPS: ORAL_CAPSULE | ORAL | 0 refills | Status: DC

## 2016-06-16 NOTE — Patient Instructions (Signed)
If you are age 73 or older, your body mass index should be between 23-30. Your Body mass index is 26.91 kg/m. If this is out of the aforementioned range listed, please consider follow up with your Primary Care Provider.  If you are age 11 or younger, your body mass index should be between 19-25. Your Body mass index is 26.91 kg/m. If this is out of the aformentioned range listed, please consider follow up with your Primary Care Provider.   We have sent the following medications to your pharmacy for you to pick up at your convenience: Pylera   Continue Protonix twice a day until Pylera treatment is completed, then continue Protonix once a day long term.  Stop oral Iron.  Restart your baby Aspirin.  Thank you for choosing [facility]

## 2016-06-16 NOTE — Progress Notes (Signed)
Subjective:    Patient ID: Jerry Farrell, male    DOB: [birth year], 71 y.o.   MRN: [id number]  HPI Jerry Farrell is a very nice 71 year old African-American male known to Dr. Fuller Plan who comes in today for post hospital follow-up. Patient had undergone EGD with Savary dilation on 06/01/2016 per Dr. Fuller Plan for complaints of dysphagia. There was no evidence for stricture or ring at EGD. He was dilated to 16 mm Savary, and also noted to have mild gastritis. This was biopsied and biopsies returned positive for H. pylori. Patient was admitted to the hospital on 06/06/2016 after he presented with weakness dizziness dark stool and was noted to have a 5 g drop in hemoglobin. His hemoglobin in December 2017 was 16.5, on arrival to the ER on 213 down to 10.8.Marland Kitchen After admission his hemoglobin continued to drift and he underwent EGD with Dr. Silverio Decamp on 06/07/2016. He was found to have Billroth I anatomy and a 5-6 mm ulcer/tear in the greater curve with overlying clot. 3 endoclips were placed for hemostasis. He did ultimately require 2 unit transfusion for hemoglobin down and 8 range and continued symptoms. A discharge hemoglobin was up to 10.6. Patient says he has been doing well since discharge and his energy level has improved. He had his hemoglobin checked on 06/14/2016 and was up to 12.5 with hematocrit of 36.7. He says he was constipated for few days after discharge and that his stools are still somewhat dark but he was placed on iron apparently a discharge. He is taking twice a day Protonix and had been holding his baby aspirin. He is no complaints of abdominal discomfort or nausea,and has absolutely no complaints of dysphagia.  Review of Systems Pertinent positive and negative review of systems were noted in the above HPI section.  All other review of systems was otherwise negative.  Outpatient Encounter Prescriptions as of 06/16/2016  Medication Sig  . aspirin EC 81 MG tablet Take 1 tablet (81 mg total) by mouth  daily.  Marland Kitchen atorvastatin (LIPITOR) 10 MG tablet TAKE 1 TABLET BY MOUTH EVERY DAY  . brimonidine (ALPHAGAN) 0.2 % ophthalmic solution INSTILL 1 DROP IN LEFT EYE TWICE DAILY  . carvedilol (COREG) 3.125 MG tablet Take 1 tablet (3.125 mg total) by mouth 2 (two) times daily with a meal.  . dorzolamide-timolol (COSOPT) 22.3-6.8 MG/ML ophthalmic solution Place 1 drop into the left eye 2 (two) times daily. Use as directed   . ferrous sulfate 325 (65 FE) MG tablet Take 1 tablet (325 mg total) by mouth 2 (two) times daily with a meal.  . pantoprazole (PROTONIX) 40 MG tablet Take 1 tablet (40 mg total) by mouth daily. Take one tablet twice daily for two weeks, then one tablet daily afterwards  . tamsulosin (FLOMAX) 0.4 MG CAPS capsule TAKE 1 CAPSULE (0.4 MG TOTAL) BY MOUTH DAILY.  . valsartan-hydrochlorothiazide (DIOVAN-HCT) 160-25 MG tablet Take 1 tablet by mouth daily.  Marland Kitchen bismuth-metronidazole-tetracycline (PYLERA) 140-125-125 MG capsule Use blister packs as directed 4 times a day for 14 days   No facility-administered encounter medications on file as of 06/16/2016.    No Known Allergies Patient Active Problem List   Diagnosis Date Noted  . Peptic ulcer disease 06/14/2016  . Iron deficiency anemia 06/14/2016  . Vitamin B12 deficiency 06/14/2016  . Heart palpitations   . Symptomatic anemia 06/06/2016  . Acute GI bleeding   . Hyperglycemia 01/31/2016  . Need for hepatitis C screening test 01/25/2016  . Post-nasal drip 12/10/2015  .  GERD (gastroesophageal reflux disease) 07/20/2015  . Routine general medical examination at a health care facility 10/28/2014  . Elevated alkaline phosphatase level 10/28/2014  . Urinary frequency 04/29/2014  . BPH (benign prostatic hyperplasia) 04/29/2014  . Prostate cancer screening 08/11/2013  . Encounter for Medicare annual wellness exam 08/11/2013  . TRANSAMINASES, SERUM, ELEVATED 04/05/2010  . Hyperlipidemia 01/09/2007  . ERECTILE DYSFUNCTION 01/09/2007  .  Essential hypertension 01/09/2007  . Coronary atherosclerosis 01/09/2007  . ESOPHAGITIS 01/09/2007   Social History   Social History  . Marital status: Married    Spouse name: N/A  . Number of children: 3  . Years of education: N/A   Occupational History  . [employer] Retired   Social History Main Topics  . Smoking status: Never Smoker  . Smokeless tobacco: Never Used  . Alcohol use No  . Drug use: No  . Sexual activity: Yes   Other Topics Concern  . Not on file   Social History Narrative  . No narrative on file    Jerry Farrell family history includes Cancer in his sister; Colon cancer in his brother and brother; Hypertension in his mother; Throat cancer in his brother.      Objective:    Vitals:   06/16/16 1036  BP: 110/70  Pulse: 82    Physical Exam well-developed older African-American male in no acute distress, pleasant blood pressure 110/70 pulse 82, BMI 26.9. HEENT; nontraumatic normocephalic EOMI PERRLA sclera anicteric, Cardiovascular ;regular rate and rhythm with S1-S2 no murmur or gallop, Pulmonary; clear bilaterally, Abdomen ;soft nontender nondistended bowel sounds are active there is no palpable mass or hepatosplenomegaly, Rectal; exam not done, Ext; no clubbing cyanosis or edema skin warm and dry, Neuropsych ;mood and affect appropriate         Assessment & Plan:   #31 71 year old African-American male seen today in post hospital follow-up after recent admission with GI bleeding occurring about 5 days post-EGD and savory dilation of the esophagus. Repeat EGD showed a 5-6 mm ulcer/tear in the greater curvature of the stomach which required endoclips 3 for hemostasis. #2 anemia secondary to blood loss- 2 unit transfusion-stable and improved now #3 status post remote Billroth I for peptic ulcer disease #4 dysphagia-resolved #5 coronary artery disease #6 H. pylori positive gastropathy  Plan; Patient may resume  baby aspirin 1 daily Continue Protonix 40  mg by mouth twice daily until he completes a course of Pylera 2 weeks then decrease to once daily longer-term Will treat H. pylori with Pylera 4 times daily 14 days Stop oral iron Patient will follow up with Dr. Fuller Plan or myself on an as-needed basis.  Amy S Esterwood PA-C 06/16/2016   Cc: Tower, Wynelle Fanny, MD

## 2016-06-18 NOTE — Progress Notes (Signed)
Reviewed and agree with management plan.  Malcolm T. Stark, MD FACG 

## 2016-06-19 NOTE — Telephone Encounter (Signed)
During the office visit of 06-16-2016 with A. Trellis Paganini, PA. Pt was asked to take a Pylera 14 day course. Rx was sent to the pharmacy as we had no sample kits at the time. Pt as of today has been given the full 14 days sample kit.

## 2016-07-08 ENCOUNTER — Telehealth: Payer: Self-pay | Admitting: Family Medicine

## 2016-07-08 DIAGNOSIS — D500 Iron deficiency anemia secondary to blood loss (chronic): Secondary | ICD-10-CM

## 2016-07-08 DIAGNOSIS — E538 Deficiency of other specified B group vitamins: Secondary | ICD-10-CM

## 2016-07-08 NOTE — Telephone Encounter (Signed)
-----   Message from Ellamae Sia sent at 07/04/2016  3:08 PM EDT ----- Regarding: Lab orders for Wednesday, 3.21.18 Lab orders for 1 month

## 2016-07-10 NOTE — Addendum Note (Signed)
Addended by: Ellamae Sia on: 07/10/2016 10:56 AM   Modules accepted: Orders

## 2016-07-12 ENCOUNTER — Other Ambulatory Visit (INDEPENDENT_AMBULATORY_CARE_PROVIDER_SITE_OTHER): Payer: Medicare Other

## 2016-07-12 DIAGNOSIS — D500 Iron deficiency anemia secondary to blood loss (chronic): Secondary | ICD-10-CM

## 2016-07-12 DIAGNOSIS — E538 Deficiency of other specified B group vitamins: Secondary | ICD-10-CM

## 2016-07-12 LAB — CBC WITH DIFFERENTIAL/PLATELET
BASOS PCT: 0.7 % (ref 0.0–3.0)
Basophils Absolute: 0 10*3/uL (ref 0.0–0.1)
EOS PCT: 2.8 % (ref 0.0–5.0)
Eosinophils Absolute: 0.2 10*3/uL (ref 0.0–0.7)
HCT: 42.5 % (ref 39.0–52.0)
Hemoglobin: 14.2 g/dL (ref 13.0–17.0)
LYMPHS ABS: 1.4 10*3/uL (ref 0.7–4.0)
Lymphocytes Relative: 20.4 % (ref 12.0–46.0)
MCHC: 33.4 g/dL (ref 30.0–36.0)
MCV: 86.1 fl (ref 78.0–100.0)
MONO ABS: 0.6 10*3/uL (ref 0.1–1.0)
Monocytes Relative: 8.3 % (ref 3.0–12.0)
NEUTROS ABS: 4.7 10*3/uL (ref 1.4–7.7)
NEUTROS PCT: 67.8 % (ref 43.0–77.0)
PLATELETS: 157 10*3/uL (ref 150.0–400.0)
RBC: 4.94 Mil/uL (ref 4.22–5.81)
RDW: 13.4 % (ref 11.5–15.5)
WBC: 6.9 10*3/uL (ref 4.0–10.5)

## 2016-07-12 LAB — FERRITIN: Ferritin: 17.1 ng/mL — ABNORMAL LOW (ref 22.0–322.0)

## 2016-07-12 LAB — VITAMIN B12: VITAMIN B 12: 373 pg/mL (ref 211–911)

## 2016-07-13 DIAGNOSIS — H401131 Primary open-angle glaucoma, bilateral, mild stage: Secondary | ICD-10-CM | POA: Diagnosis not present

## 2016-07-13 DIAGNOSIS — I10 Essential (primary) hypertension: Secondary | ICD-10-CM | POA: Diagnosis not present

## 2016-07-13 DIAGNOSIS — Z961 Presence of intraocular lens: Secondary | ICD-10-CM | POA: Diagnosis not present

## 2016-07-13 DIAGNOSIS — H2511 Age-related nuclear cataract, right eye: Secondary | ICD-10-CM | POA: Diagnosis not present

## 2016-09-01 DIAGNOSIS — H1045 Other chronic allergic conjunctivitis: Secondary | ICD-10-CM | POA: Diagnosis not present

## 2016-09-10 ENCOUNTER — Other Ambulatory Visit: Payer: Self-pay | Admitting: Cardiovascular Disease

## 2016-09-11 NOTE — Telephone Encounter (Signed)
Rx request sent to pharmacy.  

## 2016-11-26 ENCOUNTER — Other Ambulatory Visit: Payer: Self-pay | Admitting: Family Medicine

## 2017-01-06 ENCOUNTER — Other Ambulatory Visit: Payer: Self-pay | Admitting: Family Medicine

## 2017-01-16 ENCOUNTER — Other Ambulatory Visit: Payer: Self-pay

## 2017-01-17 ENCOUNTER — Other Ambulatory Visit: Payer: Self-pay

## 2017-01-17 MED ORDER — VALSARTAN-HYDROCHLOROTHIAZIDE 160-25 MG PO TABS: 1.0000 | ORAL_TABLET | Freq: Every day | ORAL | 6 refills | Status: DC

## 2017-01-18 NOTE — Telephone Encounter (Signed)
Thanks for the update. Not all Valsartan was recalled. We will wait and see if we need to change his prescription.  Thanks you again.

## 2017-01-19 DIAGNOSIS — H401131 Primary open-angle glaucoma, bilateral, mild stage: Secondary | ICD-10-CM | POA: Diagnosis not present

## 2017-01-19 DIAGNOSIS — H40013 Open angle with borderline findings, low risk, bilateral: Secondary | ICD-10-CM | POA: Diagnosis not present

## 2017-02-02 DIAGNOSIS — H40013 Open angle with borderline findings, low risk, bilateral: Secondary | ICD-10-CM | POA: Diagnosis not present

## 2017-02-02 DIAGNOSIS — H2511 Age-related nuclear cataract, right eye: Secondary | ICD-10-CM | POA: Diagnosis not present

## 2017-02-26 ENCOUNTER — Other Ambulatory Visit: Payer: Self-pay | Admitting: Family Medicine

## 2017-02-26 NOTE — Telephone Encounter (Signed)
Copied from [place] #[id number]. Topic: Quick Communication - See Telephone Encounter >> Feb 26, 2017  9:56 AM Arletha Grippe wrote: CRM for notification. See Telephone encounter for:   02/26/17. Pt calling for refill of tamsulosin (FLOMAX) 0.4 MG CAPS capsule  Pt has appt for follow up on 03/26/17 [facility]

## 2017-03-01 DIAGNOSIS — H25011 Cortical age-related cataract, right eye: Secondary | ICD-10-CM | POA: Diagnosis not present

## 2017-03-01 DIAGNOSIS — H4311 Vitreous hemorrhage, right eye: Secondary | ICD-10-CM | POA: Diagnosis not present

## 2017-03-01 DIAGNOSIS — H25041 Posterior subcapsular polar age-related cataract, right eye: Secondary | ICD-10-CM | POA: Diagnosis not present

## 2017-03-01 DIAGNOSIS — H2511 Age-related nuclear cataract, right eye: Secondary | ICD-10-CM | POA: Diagnosis not present

## 2017-03-25 ENCOUNTER — Other Ambulatory Visit: Payer: Self-pay | Admitting: Physician Assistant

## 2017-03-26 ENCOUNTER — Encounter: Payer: Self-pay | Admitting: Family Medicine

## 2017-03-26 ENCOUNTER — Ambulatory Visit (INDEPENDENT_AMBULATORY_CARE_PROVIDER_SITE_OTHER): Payer: Medicare Other | Admitting: Family Medicine

## 2017-03-26 VITALS — BP 140/70 | HR 64 | Temp 98.6°F | Ht 68.0 in | Wt 183.5 lb

## 2017-03-26 DIAGNOSIS — E785 Hyperlipidemia, unspecified: Secondary | ICD-10-CM

## 2017-03-26 DIAGNOSIS — N400 Enlarged prostate without lower urinary tract symptoms: Secondary | ICD-10-CM

## 2017-03-26 DIAGNOSIS — K219 Gastro-esophageal reflux disease without esophagitis: Secondary | ICD-10-CM

## 2017-03-26 DIAGNOSIS — Z23 Encounter for immunization: Secondary | ICD-10-CM | POA: Diagnosis not present

## 2017-03-26 DIAGNOSIS — I10 Essential (primary) hypertension: Secondary | ICD-10-CM | POA: Diagnosis not present

## 2017-03-26 DIAGNOSIS — R739 Hyperglycemia, unspecified: Secondary | ICD-10-CM

## 2017-03-26 DIAGNOSIS — E538 Deficiency of other specified B group vitamins: Secondary | ICD-10-CM

## 2017-03-26 DIAGNOSIS — D500 Iron deficiency anemia secondary to blood loss (chronic): Secondary | ICD-10-CM

## 2017-03-26 DIAGNOSIS — K279 Peptic ulcer, site unspecified, unspecified as acute or chronic, without hemorrhage or perforation: Secondary | ICD-10-CM

## 2017-03-26 DIAGNOSIS — R350 Frequency of micturition: Secondary | ICD-10-CM | POA: Diagnosis not present

## 2017-03-26 LAB — POC URINALSYSI DIPSTICK (AUTOMATED)
BILIRUBIN UA: NEGATIVE
GLUCOSE UA: NEGATIVE
Ketones, UA: NEGATIVE
LEUKOCYTES UA: NEGATIVE
NITRITE UA: NEGATIVE
Protein, UA: NEGATIVE
RBC UA: NEGATIVE
Spec Grav, UA: 1.02 (ref 1.010–1.025)
UROBILINOGEN UA: 0.2 U/dL
pH, UA: 6 (ref 5.0–8.0)

## 2017-03-26 MED ORDER — TAMSULOSIN HCL 0.4 MG PO CAPS: ORAL_CAPSULE | ORAL | 3 refills | Status: DC

## 2017-03-26 MED ORDER — PANTOPRAZOLE SODIUM 40 MG PO TBEC: 40.0000 mg | DELAYED_RELEASE_TABLET | Freq: Every day | ORAL | 3 refills | Status: DC

## 2017-03-26 NOTE — Progress Notes (Signed)
Subjective:    Patient ID: Jerry Farrell, male    DOB: Jan 25, 1946, 71 y.o.   MRN: [id number]  HPI  Here for f/u of chronic health problems/annual exam   Wt Readings from Last 3 Encounters:  03/26/17 183 lb 8 oz (83.2 kg)  06/16/16 177 lb (80.3 kg)  06/14/16 176 lb 12 oz (80.2 kg)   27.90 kg/m   Had GI bleed last feb - admitted to the hospital for bleeding peptic ulcer and transfusion  (shortly after egd and esoph dilatation)  Of note- remonte hx of [place] 1 for peptic ulcer dz and treated H pylori Was on protonix and held asa  = burning sensation in chest that rad to the back Burping helps  Coffee and chocolate make it worse Throat clearing and occ globus sensation  No dyphagia  Was on bid then qd protonix -off of it now and thinks he may have to go back on it   Took iron-still taking it now (? Needs it)  Lab Results  Component Value Date   WBC 6.9 07/12/2016   HGB 14.2 07/12/2016   HCT 42.5 07/12/2016   MCV 86.1 07/12/2016   PLT 157.0 07/12/2016   improved to normal   Also notes more urinary frequency (takes flomax) for BPH Having more urinary frequency  No incontinence - but does have urgency during the day Nocturia - 0-3 times (not every night)  Is interested in increasing flomax dose   Lab Results  Component Value Date   PSA 1.61 01/26/2016   PSA 1.21 10/23/2014   PSA 1.41 08/12/2013     Results for orders placed or performed in visit on 03/26/17  POCT Urinalysis Dipstick (Automated)  Result Value Ref Range   Color, UA Yellow    Clarity, UA Clear    Glucose, UA Negative    Bilirubin, UA Negative    Ketones, UA Negative    Spec Grav, UA 1.020 1.010 - 1.025   Blood, UA Negative    pH, UA 6.0 5.0 - 8.0   Protein, UA Negative    Urobilinogen, UA 0.2 0.2 or 1.0 E.U./dL   Nitrite, UA Negative    Leukocytes, UA Negative Negative     bp is up on first check today  No cp or palpitations or headaches or edema  No side effects to medicines  BP  Readings from Last 3 Encounters:  03/26/17 (!) 146/72  06/16/16 110/70  06/14/16 122/60    On diovan hct  He keeps up with cardiology visit and his medicines    Lab Results  Component Value Date   CREATININE 0.94 06/09/2016   BUN 8 06/09/2016   NA 138 06/09/2016   K 4.2 06/09/2016   CL 106 06/09/2016   CO2 28 06/09/2016   Lab Results  Component Value Date   ALT 19 04/09/2016   AST 19 04/09/2016   ALKPHOS 92 04/09/2016   BILITOT 1.7 (H) 04/09/2016   Hx of hyperglycemia  Due for A1C Also due for lipid check on atorvastatin  Diet - fair -balanced good and bad foods (wife cooks healthy but he eats out)    Hx of B12 def Lab Results  Component Value Date   VITAMINB12 373 07/12/2016  takes B12 orally-- unsure what dose   Patient Active Problem List   Diagnosis Date Noted  . Peptic ulcer disease 06/14/2016  . Iron deficiency anemia 06/14/2016  . B12 deficiency 06/14/2016  . Heart palpitations   . Symptomatic anemia  06/06/2016  . Hyperglycemia 01/31/2016  . Need for hepatitis C screening test 01/25/2016  . Post-nasal drip 12/10/2015  . GERD (gastroesophageal reflux disease) 07/20/2015  . Routine general medical examination at a health care facility 10/28/2014  . Elevated alkaline phosphatase level 10/28/2014  . Urinary frequency 04/29/2014  . BPH (benign prostatic hyperplasia) 04/29/2014  . Prostate cancer screening 08/11/2013  . Encounter for Medicare annual wellness exam 08/11/2013  . TRANSAMINASES, SERUM, ELEVATED 04/05/2010  . Hyperlipidemia 01/09/2007  . ERECTILE DYSFUNCTION 01/09/2007  . Essential hypertension 01/09/2007  . Coronary atherosclerosis 01/09/2007  . ESOPHAGITIS 01/09/2007   Past Medical History:  Diagnosis Date  . BPH (benign prostatic hyperplasia)   . CAD (coronary artery disease)    cardiolite ok 3/05, stress test- low risk study 11/06  . Chronic low back pain    with radiculopathy  . ED (erectile dysfunction)   . Gallstones    abd [place]-  stable hamangioma, gallbladder sludge and tiny stones 09/2003  . GERD (gastroesophageal reflux disease)   . Hyperlipidemia   . Hypertension   . Transfusion history    Past Surgical History:  Procedure Laterality Date  . CATARACT EXTRACTION W/PHACO  03/13/2012   Procedure: CATARACT EXTRACTION PHACO AND INTRAOCULAR LENS PLACEMENT (IOC);  Surgeon: Adonis Brook, MD;  Location: [place];  Service: Ophthalmology;  Laterality: Left;  . CORONARY ARTERY BYPASS GRAFT  1998   x 1  done here at [facility]  . ESOPHAGOGASTRODUODENOSCOPY (EGD) WITH PROPOFOL N/A 06/07/2016   Procedure: ESOPHAGOGASTRODUODENOSCOPY (EGD) WITH PROPOFOL;  Surgeon: Mauri Pole, MD;  Location: [facility];  Service: Endoscopy;  Laterality: N/A;  . GASTRECTOMY     partial  . MEMBRANE PEEL  03/13/2012   Procedure: MEMBRANE PEEL;  Surgeon: Adonis Brook, MD;  Location: [place];  Service: Ophthalmology;  Laterality: Left;  . PARS PLANA VITRECTOMY  03/13/2012   Procedure: PARS PLANA VITRECTOMY WITH 23 GAUGE;  Surgeon: Adonis Brook, MD;  Location: [place];  Service: Ophthalmology;  Laterality: Left;   Social History   Tobacco Use  . Smoking status: Never Smoker  . Smokeless tobacco: Never Used  Substance Use Topics  . Alcohol use: No    Alcohol/week: 0.0 oz  . Drug use: No   Family History  Problem Relation Age of Onset  . Cancer Sister        unknown type, pt feels alcohol related  . Colon cancer Brother   . Throat cancer Brother        pt feels alcohol related  . Colon cancer Brother        1/2 brother  . Hypertension Mother   . Stomach cancer Neg Hx   . Pancreatic cancer Neg Hx    No Known Allergies Current Outpatient Medications on File Prior to Visit  Medication Sig Dispense Refill  . aspirin EC 81 MG tablet Take 1 tablet (81 mg total) by mouth daily. 90 tablet 3  . atorvastatin (LIPITOR) 10 MG tablet TAKE 1 TABLET BY MOUTH EVERY DAY 90 tablet 0  . brimonidine (ALPHAGAN) 0.2 % ophthalmic solution INSTILL 1 DROP IN  LEFT EYE TWICE DAILY  0  . carvedilol (COREG) 3.125 MG tablet TAKE 1 TABLET (3.125 MG TOTAL) BY MOUTH 2 (TWO) TIMES DAILY WITH A MEAL. 180 tablet 3  . dorzolamide-timolol (COSOPT) 22.3-6.8 MG/ML ophthalmic solution Place 1 drop into the left eye 2 (two) times daily. Use as directed     . ferrous sulfate 325 (65 FE) MG tablet Take 1 tablet (  325 mg total) by mouth 2 (two) times daily with a meal. 30 tablet 0  . valsartan-hydrochlorothiazide (DIOVAN-HCT) 160-25 MG tablet Take 1 tablet by mouth daily. 30 tablet 6   No current facility-administered medications on file prior to visit.     Review of Systems  Constitutional: Negative for activity change, appetite change, fatigue, fever and unexpected weight change.  HENT: Positive for postnasal drip. Negative for congestion, rhinorrhea, sore throat and trouble swallowing.        Pos for throat clearing and globus sensation  Eyes: Negative for pain, redness, itching and visual disturbance.  Respiratory: Negative for cough, chest tightness, shortness of breath and wheezing.   Cardiovascular: Negative for chest pain and palpitations.  Gastrointestinal: Negative for abdominal pain, blood in stool, constipation, diarrhea and nausea.       Pos for heartburn symptoms   Endocrine: Negative for cold intolerance, heat intolerance, polydipsia and polyuria.  Genitourinary: Positive for frequency and urgency. Negative for decreased urine volume, difficulty urinating, dysuria and flank pain.  Musculoskeletal: Negative for arthralgias, joint swelling and myalgias.  Skin: Negative for pallor and rash.  Neurological: Negative for dizziness, tremors, weakness, numbness and headaches.  Hematological: Negative for adenopathy. Does not bruise/bleed easily.  Psychiatric/Behavioral: Negative for decreased concentration and dysphoric mood. The patient is not nervous/anxious.        Objective:   Physical Exam  Constitutional: He appears well-developed and  well-nourished. No distress.  Well appearing   HENT:  Head: Normocephalic and atraumatic.  Mouth/Throat: Oropharynx is clear and moist.  Eyes: Conjunctivae and EOM are normal. Pupils are equal, round, and reactive to light. Right eye exhibits no discharge. Left eye exhibits no discharge. No scleral icterus.  Neck: Normal range of motion. Neck supple. No JVD present. Carotid bruit is not present. No thyromegaly present.  Cardiovascular: Normal rate, regular rhythm, normal heart sounds and intact distal pulses. Exam reveals no gallop.  Pulmonary/Chest: Effort normal and breath sounds normal. No respiratory distress. He has no wheezes. He has no rales.  No crackles  Abdominal: Soft. Bowel sounds are normal. He exhibits no distension, no abdominal bruit and no mass. There is no tenderness. There is no rebound and no guarding.  Baseline scar  Musculoskeletal: He exhibits no edema or tenderness.  Lymphadenopathy:    He has no cervical adenopathy.  Neurological: He is alert. He has normal reflexes. No cranial nerve deficit. He exhibits normal muscle tone. Coordination normal.  Skin: Skin is warm and dry. No rash noted. No pallor.  Psychiatric: He has a normal mood and affect.          Assessment & Plan:   Problem List Items Addressed This Visit      Cardiovascular and Mediastinum   Essential hypertension - Primary    bp is up slightly today (per pt ok at home) BP: 140/70  Labs today  F/u 1 mo for re check Also f/u with cardiology when due       Relevant Orders   CBC with Differential/Platelet   Comprehensive metabolic panel   Lipid panel   TSH     Digestive   GERD (gastroesophageal reflux disease)    More heartburn /throat clearing since stopping his protonix  Will re start this at 40 mg once daily  Hx of PUD as well as esophagitis and past GI bleed F/u 1 mo       Relevant Medications   pantoprazole (PROTONIX) 40 MG tablet   Peptic ulcer disease  Will start back on  protonix for gerd symptoms  Denies abd pain or bowel changes Cbc today      Relevant Orders   CBC with Differential/Platelet     Genitourinary   BPH (benign prostatic hyperplasia)    More frequency lately- ? Not emptying bladder Will inc flomax to 0.8 mg once daily  Update if side effects/dizziness or other  F/u about 1 mo for re check  PSA with lab today      Relevant Medications   tamsulosin (FLOMAX) 0.4 MG CAPS capsule   Other Relevant Orders   PSA, Medicare     Other   B12 deficiency    Taking orally daily  B12 level today      Relevant Orders   Vitamin B12   Hyperglycemia    A1C today  Wt is up  Diet fair disc imp of low glycemic diet and wt loss to prevent DM2       Relevant Orders   Hemoglobin A1c   Hyperlipidemia    Lipid panel today on atorvastatin  Hx of CAD Disc goals for lipids and reasons to control them Rev labs with pt (last)  Rev low sat fat diet in detail       Iron deficiency anemia    From GI bleed in feb Still on iron  If cbc is nl -will be able to stop it       Relevant Orders   CBC with Differential/Platelet   Urinary frequency    Worse from BPH Will try inc flomax to 0.8 mg daily as tolerated F/u 1 mo  Nl UA today  psa today      Relevant Orders   POCT Urinalysis Dipstick (Automated) (Completed)    Other Visit Diagnoses    Need for influenza vaccination       Relevant Orders   Flu Vaccine QUAD 6+ mos PF IM (Fluarix Quad PF) (Completed)

## 2017-03-26 NOTE — Telephone Encounter (Signed)
Refill Request.  

## 2017-03-26 NOTE — Patient Instructions (Addendum)
Make sure to call the cardiology office to see when they want to see you back   Increase your flomax to 2 pills once daily (this is for urinary frequency)   Start back on protonix 40 mg once daily in the am for your stomach and esophagus   Follow up with me in about a month please   Labs today   Flu shot today   Take care of yourself

## 2017-03-26 NOTE — Assessment & Plan Note (Signed)
bp is up slightly today (per pt ok at home) BP: 140/70  Labs today  F/u 1 mo for re check Also f/u with cardiology when due

## 2017-03-26 NOTE — Assessment & Plan Note (Signed)
From GI bleed in feb Still on iron  If cbc is nl -will be able to stop it

## 2017-03-26 NOTE — Assessment & Plan Note (Signed)
Worse from BPH Will try inc flomax to 0.8 mg daily as tolerated F/u 1 mo  Nl UA today  psa today

## 2017-03-26 NOTE — Assessment & Plan Note (Signed)
A1C today  Wt is up  Diet fair disc imp of low glycemic diet and wt loss to prevent DM2

## 2017-03-26 NOTE — Assessment & Plan Note (Signed)
Taking orally daily  B12 level today

## 2017-03-26 NOTE — Assessment & Plan Note (Signed)
Will start back on protonix for gerd symptoms  Denies abd pain or bowel changes Cbc today

## 2017-03-26 NOTE — Assessment & Plan Note (Signed)
More heartburn /throat clearing since stopping his protonix  Will re start this at 40 mg once daily  Hx of PUD as well as esophagitis and past GI bleed F/u 1 mo

## 2017-03-26 NOTE — Assessment & Plan Note (Signed)
More frequency lately- ? Not emptying bladder Will inc flomax to 0.8 mg once daily  Update if side effects/dizziness or other  F/u about 1 mo for re check  PSA with lab today

## 2017-03-26 NOTE — Assessment & Plan Note (Signed)
Lipid panel today on atorvastatin  Hx of CAD Disc goals for lipids and reasons to control them Rev labs with pt (last)  Rev low sat fat diet in detail

## 2017-03-27 LAB — CBC WITH DIFFERENTIAL/PLATELET
BASOS PCT: 0.8 % (ref 0.0–3.0)
Basophils Absolute: 0.1 10*3/uL (ref 0.0–0.1)
EOS PCT: 1.7 % (ref 0.0–5.0)
Eosinophils Absolute: 0.1 10*3/uL (ref 0.0–0.7)
HEMATOCRIT: 47.1 % (ref 39.0–52.0)
Hemoglobin: 15.8 g/dL (ref 13.0–17.0)
LYMPHS PCT: 18.2 % (ref 12.0–46.0)
Lymphs Abs: 1.5 10*3/uL (ref 0.7–4.0)
MCHC: 33.7 g/dL (ref 30.0–36.0)
MCV: 87.9 fl (ref 78.0–100.0)
MONOS PCT: 8.6 % (ref 3.0–12.0)
Monocytes Absolute: 0.7 10*3/uL (ref 0.1–1.0)
Neutro Abs: 5.7 10*3/uL (ref 1.4–7.7)
Neutrophils Relative %: 70.7 % (ref 43.0–77.0)
Platelets: 153 10*3/uL (ref 150.0–400.0)
RBC: 5.35 Mil/uL (ref 4.22–5.81)
RDW: 12.9 % (ref 11.5–15.5)
WBC: 8.1 10*3/uL (ref 4.0–10.5)

## 2017-03-27 LAB — LIPID PANEL
CHOLESTEROL: 142 mg/dL (ref 0–200)
HDL: 51 mg/dL (ref >39.00)
LDL Cholesterol: 67 mg/dL (ref 0–99)
NonHDL: 90.66
TRIGLYCERIDES: 116 mg/dL (ref 0.0–149.0)
Total CHOL/HDL Ratio: 3
VLDL: 23.2 mg/dL (ref 0.0–40.0)

## 2017-03-27 LAB — COMPREHENSIVE METABOLIC PANEL
ALBUMIN: 4.5 g/dL (ref 3.5–5.2)
ALK PHOS: 106 U/L (ref 39–117)
ALT: 19 U/L (ref 0–53)
AST: 22 U/L (ref 0–37)
BILIRUBIN TOTAL: 1.8 mg/dL — AB (ref 0.2–1.2)
BUN: 15 mg/dL (ref 6–23)
CALCIUM: 9.3 mg/dL (ref 8.4–10.5)
CHLORIDE: 98 meq/L (ref 96–112)
CO2: 30 mEq/L (ref 19–32)
CREATININE: 1.03 mg/dL (ref 0.40–1.50)
GFR: 90.86 mL/min (ref >60.00)
Glucose, Bld: 98 mg/dL (ref 70–99)
Potassium: 3.6 mEq/L (ref 3.5–5.1)
Sodium: 136 mEq/L (ref 135–145)
TOTAL PROTEIN: 7 g/dL (ref 6.0–8.3)

## 2017-03-27 LAB — PSA, MEDICARE: PSA: 2.71 ng/mL (ref 0.10–4.00)

## 2017-03-27 LAB — TSH: TSH: 0.2 u[IU]/mL — ABNORMAL LOW (ref 0.35–4.50)

## 2017-03-27 LAB — VITAMIN B12

## 2017-03-27 LAB — HEMOGLOBIN A1C: HEMOGLOBIN A1C: 6 % (ref 4.6–6.5)

## 2017-04-02 ENCOUNTER — Other Ambulatory Visit: Payer: Self-pay | Admitting: Family Medicine

## 2017-04-09 ENCOUNTER — Other Ambulatory Visit: Payer: Self-pay | Admitting: Family Medicine

## 2017-04-11 ENCOUNTER — Encounter: Payer: Self-pay | Admitting: Cardiovascular Disease

## 2017-04-11 ENCOUNTER — Ambulatory Visit: Payer: Medicare Other | Admitting: Cardiovascular Disease

## 2017-04-11 VITALS — BP 142/70 | HR 58 | Ht 68.0 in | Wt 186.0 lb

## 2017-04-11 DIAGNOSIS — I2510 Atherosclerotic heart disease of native coronary artery without angina pectoris: Secondary | ICD-10-CM | POA: Diagnosis not present

## 2017-04-11 DIAGNOSIS — I10 Essential (primary) hypertension: Secondary | ICD-10-CM

## 2017-04-11 DIAGNOSIS — E7800 Pure hypercholesterolemia, unspecified: Secondary | ICD-10-CM | POA: Diagnosis not present

## 2017-04-11 NOTE — Patient Instructions (Signed)

## 2017-04-11 NOTE — Assessment & Plan Note (Signed)
History of CAD status post coronary artery bypass grafting times one in 1994 for what sounds like left main disease. He's done well since. A recent Myoview stress test performed 04/11/16 showed no evidence of ischemia although he did have ST segment changes with exercise thought to be related to repolarization from LVH.

## 2017-04-11 NOTE — Assessment & Plan Note (Signed)
History of essential hypertension blood pressure measured at 142/70. He is on carvedilol, Diovan and hydrochlorothiazide. Continue current meds at current dosing.

## 2017-04-11 NOTE — Assessment & Plan Note (Signed)
History of hyperlipidemia on statin therapy with lipid profile performed 03/26/17 revealing a total cholesterol 142, LDL 67 and HDL of 51.

## 2017-04-11 NOTE — Progress Notes (Signed)
04/11/2017 Jerry Farrell   04/06/1946  [id number]  Primary Physician [place], Wynelle Fanny, MD Primary Cardiologist: Lorretta Harp MD [name], [place]  HPI:  Jerry Farrell is a 71 y.o.  thin-appearing married African-American male father of 1, grandfather and 5 grandchildren who is referred back to me by Dr. Glori Bickers, his PCP, to be reestablished in my practice. I last saw him in the office 01/19/16. His risk factors include treated hypertension and hyperlipidemia. There is is a family history of a brother who recently had bypass surgery. He has never smoked. Retired from [employer]. He had coronary artery bypass grafting X 1  in 1994 for what sounds like left main disease. He denies chest pain or shortness of breath. He did see how main in the office 06/02/16 after having had a Myoview stress test 04/11/16 that was essentially normal with no evidence of ischemia. Since that time he really denies chest pain or shortness of breath.      Current Meds  Medication Sig  . aspirin EC 81 MG tablet Take 1 tablet (81 mg total) by mouth daily.  Marland Kitchen atorvastatin (LIPITOR) 10 MG tablet TAKE 1 TABLET BY MOUTH EVERY DAY  . brimonidine (ALPHAGAN) 0.2 % ophthalmic solution INSTILL 1 DROP IN LEFT EYE TWICE DAILY  . carvedilol (COREG) 3.125 MG tablet TAKE 1 TABLET (3.125 MG TOTAL) BY MOUTH 2 (TWO) TIMES DAILY WITH A MEAL.  Marland Kitchen dorzolamide-timolol (COSOPT) 22.3-6.8 MG/ML ophthalmic solution Place 1 drop into the left eye 2 (two) times daily. Use as directed   . pantoprazole (PROTONIX) 40 MG tablet Take 1 tablet (40 mg total) by mouth daily.  . tamsulosin (FLOMAX) 0.4 MG CAPS capsule Take 2 capsules by mouth once daily  . valsartan-hydrochlorothiazide (DIOVAN-HCT) 160-25 MG tablet Take 1 tablet by mouth daily.     No Known Allergies  Social History   Socioeconomic History  . Marital status: Married    Spouse name: Not on file  . Number of children: 3  . Years of education: Not on file  . Highest  education level: Not on file  Social Needs  . Financial resource strain: Not on file  . Food insecurity - worry: Not on file  . Food insecurity - inability: Not on file  . Transportation needs - medical: Not on file  . Transportation needs - non-medical: Not on file  Occupational History  . Occupation: [employer]    Employer: RETIRED  Tobacco Use  . Smoking status: Never Smoker  . Smokeless tobacco: Never Used  Substance and Sexual Activity  . Alcohol use: No    Alcohol/week: 0.0 oz  . Drug use: No  . Sexual activity: Yes  Other Topics Concern  . Not on file  Social History Narrative  . Not on file     Review of Systems: General: negative for chills, fever, night sweats or weight changes.  Cardiovascular: negative for chest pain, dyspnea on exertion, edema, orthopnea, palpitations, paroxysmal nocturnal dyspnea or shortness of breath Dermatological: negative for rash Respiratory: negative for cough or wheezing Urologic: negative for hematuria Abdominal: negative for nausea, vomiting, diarrhea, bright red blood per rectum, melena, or hematemesis Neurologic: negative for visual changes, syncope, or dizziness All other systems reviewed and are otherwise negative except as noted above.    Blood pressure (!) 142/70, pulse (!) 58, height 5\' 8"  (1.727 m), weight 186 lb (84.4 kg).  General appearance: alert and no distress Neck: no adenopathy, no carotid bruit, no  JVD, supple, symmetrical, trachea midline and thyroid not enlarged, symmetric, no tenderness/mass/nodules Lungs: clear to auscultation bilaterally Heart: regular rate and rhythm, S1, S2 normal, no murmur, click, rub or gallop Extremities: extremities normal, atraumatic, no cyanosis or edema Pulses: 2+ and symmetric Skin: Skin color, texture, turgor normal. No rashes or lesions Neurologic: Alert and oriented X 3, normal strength and tone. Normal symmetric reflexes. Normal coordination and gait  EKG sinus bradycardia 58  with nonspecific ST and T-wave changes. I personally reviewed this EKG.  ASSESSMENT AND PLAN:   Hyperlipidemia History of hyperlipidemia on statin therapy with lipid profile performed 03/26/17 revealing a total cholesterol 142, LDL 67 and HDL of 51.  Essential hypertension History of essential hypertension blood pressure measured at 142/70. He is on carvedilol, Diovan and hydrochlorothiazide. Continue current meds at current dosing.  Coronary atherosclerosis History of CAD status post coronary artery bypass grafting times one in 1994 for what sounds like left main disease. He's done well since. A recent Myoview stress test performed 04/11/16 showed no evidence of ischemia although he did have ST segment changes with exercise thought to be related to repolarization from LVH.      Lorretta Harp MD FACP,FACC,FAHA, [facility] 04/11/2017 10:06 AM

## 2017-04-23 DIAGNOSIS — Z9849 Cataract extraction status, unspecified eye: Secondary | ICD-10-CM | POA: Diagnosis not present

## 2017-05-01 ENCOUNTER — Ambulatory Visit (INDEPENDENT_AMBULATORY_CARE_PROVIDER_SITE_OTHER): Payer: Medicare Other | Admitting: Family Medicine

## 2017-05-01 ENCOUNTER — Encounter: Payer: Self-pay | Admitting: Family Medicine

## 2017-05-01 VITALS — BP 126/78 | HR 63 | Temp 98.4°F | Ht 68.0 in | Wt 183.8 lb

## 2017-05-01 DIAGNOSIS — I10 Essential (primary) hypertension: Secondary | ICD-10-CM

## 2017-05-01 DIAGNOSIS — R7989 Other specified abnormal findings of blood chemistry: Secondary | ICD-10-CM | POA: Diagnosis not present

## 2017-05-01 DIAGNOSIS — D500 Iron deficiency anemia secondary to blood loss (chronic): Secondary | ICD-10-CM | POA: Diagnosis not present

## 2017-05-01 DIAGNOSIS — K219 Gastro-esophageal reflux disease without esophagitis: Secondary | ICD-10-CM

## 2017-05-01 DIAGNOSIS — N400 Enlarged prostate without lower urinary tract symptoms: Secondary | ICD-10-CM

## 2017-05-01 DIAGNOSIS — E7800 Pure hypercholesterolemia, unspecified: Secondary | ICD-10-CM | POA: Diagnosis not present

## 2017-05-01 DIAGNOSIS — R739 Hyperglycemia, unspecified: Secondary | ICD-10-CM | POA: Diagnosis not present

## 2017-05-01 LAB — TSH: TSH: 0.39 u[IU]/mL (ref 0.35–4.50)

## 2017-05-01 LAB — T4, FREE: Free T4: 0.97 ng/dL (ref 0.60–1.60)

## 2017-05-01 NOTE — Assessment & Plan Note (Signed)
Much improved with re start of protonix Urged him to continue this-esp with hx of pud and esophagitis in the past

## 2017-05-01 NOTE — Progress Notes (Signed)
Subjective:    Patient ID: Jerry Farrell, male    DOB: Mar 15, 1946, 72 y.o.   MRN: [id number]  HPI Here for f/u of chronic health problems   Feeling fine overall  Needs to get back to exercise    Wt Readings from Last 3 Encounters:  05/01/17 183 lb 12 oz (83.3 kg)  04/11/17 186 lb (84.4 kg)  03/26/17 183 lb 8 oz (83.2 kg)  wt is down 3 lb - working on it  27.94 kg/m   bp is stable today (improved from last visit) No cp or palpitations or headaches or edema  No side effects to medicines  BP Readings from Last 3 Encounters:  05/01/17 126/78  04/11/17 (!) 142/70  03/26/17 140/70    Had his cardiology visit -no changes in meds/return 1 y  bp is better today    Lab Results  Component Value Date   CREATININE 1.03 03/26/2017   BUN 15 03/26/2017   NA 136 03/26/2017   K 3.6 03/26/2017   CL 98 03/26/2017   CO2 30 03/26/2017   Lab Results  Component Value Date   ALT 19 03/26/2017   AST 22 03/26/2017   ALKPHOS 106 03/26/2017   BILITOT 1.8 (H) 03/26/2017      GERD- re started protonix at last visit  Hx of esophagitis and pud in past as well  It has helped a lot  Does better on it   BPH Last visit inc flomax for urinary symptoms (to 0.8 mg ) He still has urgency  Lab Results  Component Value Date   PSA 2.71 03/26/2017   PSA 1.61 01/26/2016   PSA 1.21 10/23/2014   is agreeable to urology ref    Hyperlipidemia Lab Results  Component Value Date   CHOL 142 03/26/2017   CHOL 156 01/26/2016   CHOL 122 10/23/2014   Lab Results  Component Value Date   HDL 51.00 03/26/2017   HDL 51.10 01/26/2016   HDL 41.60 10/23/2014   Lab Results  Component Value Date   LDLCALC 67 03/26/2017   LDLCALC 67 01/26/2016   LDLCALC 62 10/23/2014   Lab Results  Component Value Date   TRIG 116.0 03/26/2017   TRIG 191.0 (H) 01/26/2016   TRIG 93.0 10/23/2014   Lab Results  Component Value Date   CHOLHDL 3 03/26/2017   CHOLHDL 3 01/26/2016   CHOLHDL 3 10/23/2014   No  results found for: LDLDIRECT On statin tx   Hyperglycemia Lab Results  Component Value Date   HGBA1C 6.0 03/26/2017  prediabetes    TSH was low last visit Lab Results  Component Value Date   TSH 0.20 (L) 03/26/2017   no symptoms - feels ok  No change in energy level or mood or sleep No tremor    Anemia resolved  Lab Results  Component Value Date   WBC 8.1 03/26/2017   HGB 15.8 03/26/2017   HCT 47.1 03/26/2017   MCV 87.9 03/26/2017   PLT 153.0 03/26/2017    Patient Active Problem List   Diagnosis Date Noted  . Low TSH level 05/01/2017  . Peptic ulcer disease 06/14/2016  . Iron deficiency anemia 06/14/2016  . B12 deficiency 06/14/2016  . Heart palpitations   . Symptomatic anemia 06/06/2016  . Hyperglycemia 01/31/2016  . Need for hepatitis C screening test 01/25/2016  . Post-nasal drip 12/10/2015  . GERD (gastroesophageal reflux disease) 07/20/2015  . Routine general medical examination at a health care facility 10/28/2014  . Elevated  alkaline phosphatase level 10/28/2014  . Urinary frequency 04/29/2014  . BPH (benign prostatic hyperplasia) 04/29/2014  . Prostate cancer screening 08/11/2013  . Encounter for Medicare annual wellness exam 08/11/2013  . TRANSAMINASES, SERUM, ELEVATED 04/05/2010  . Hyperlipidemia 01/09/2007  . ERECTILE DYSFUNCTION 01/09/2007  . Essential hypertension 01/09/2007  . Coronary atherosclerosis 01/09/2007  . ESOPHAGITIS 01/09/2007   Past Medical History:  Diagnosis Date  . BPH (benign prostatic hyperplasia)   . CAD (coronary artery disease)    cardiolite ok 3/05, stress test- low risk study 11/06  . Chronic low back pain    with radiculopathy  . ED (erectile dysfunction)   . Gallstones    abd [place]- stable hamangioma, gallbladder sludge and tiny stones 09/2003  . GERD (gastroesophageal reflux disease)   . Hyperlipidemia   . Hypertension   . Transfusion history    Past Surgical History:  Procedure Laterality Date  . CATARACT  EXTRACTION W/PHACO  03/13/2012   Procedure: CATARACT EXTRACTION PHACO AND INTRAOCULAR LENS PLACEMENT (IOC);  Surgeon: Adonis Brook, MD;  Location: [facility];  Service: Ophthalmology;  Laterality: Left;  . CORONARY ARTERY BYPASS GRAFT  1998   x 1  done here at [facility]  . ESOPHAGOGASTRODUODENOSCOPY (EGD) WITH PROPOFOL N/A 06/07/2016   Procedure: ESOPHAGOGASTRODUODENOSCOPY (EGD) WITH PROPOFOL;  Surgeon: Mauri Pole, MD;  Location: [facility];  Service: Endoscopy;  Laterality: N/A;  . GASTRECTOMY     partial  . MEMBRANE PEEL  03/13/2012   Procedure: MEMBRANE PEEL;  Surgeon: Adonis Brook, MD;  Location: [facility];  Service: Ophthalmology;  Laterality: Left;  . PARS PLANA VITRECTOMY  03/13/2012   Procedure: PARS PLANA VITRECTOMY WITH 23 GAUGE;  Surgeon: Adonis Brook, MD;  Location: [facility];  Service: Ophthalmology;  Laterality: Left;   Social History   Tobacco Use  . Smoking status: Never Smoker  . Smokeless tobacco: Never Used  Substance Use Topics  . Alcohol use: No    Alcohol/week: 0.0 oz  . Drug use: No   Family History  Problem Relation Age of Onset  . Cancer Sister        unknown type, pt feels alcohol related  . Colon cancer Brother   . Throat cancer Brother        pt feels alcohol related  . Colon cancer Brother        1/2 brother  . Hypertension Mother   . Stomach cancer Neg Hx   . Pancreatic cancer Neg Hx    No Known Allergies Current Outpatient Medications on File Prior to Visit  Medication Sig Dispense Refill  . aspirin EC 81 MG tablet Take 1 tablet (81 mg total) by mouth daily. 90 tablet 3  . atorvastatin (LIPITOR) 10 MG tablet TAKE 1 TABLET BY MOUTH EVERY DAY 90 tablet 0  . brimonidine (ALPHAGAN) 0.2 % ophthalmic solution INSTILL 1 DROP IN LEFT EYE TWICE DAILY  0  . carvedilol (COREG) 3.125 MG tablet TAKE 1 TABLET (3.125 MG TOTAL) BY MOUTH 2 (TWO) TIMES DAILY WITH A MEAL. 180 tablet 3  . dorzolamide-timolol (COSOPT) 22.3-6.8 MG/ML ophthalmic solution Place 1 drop into  the left eye daily. Use as directed     . ferrous sulfate 325 (65 FE) MG tablet Take 1 tablet (325 mg total) by mouth 2 (two) times daily with a meal. 30 tablet 0  . pantoprazole (PROTONIX) 40 MG tablet Take 1 tablet (40 mg total) by mouth daily. 90 tablet 3  . tamsulosin (FLOMAX) 0.4 MG CAPS capsule Take 2  capsules by mouth once daily 180 capsule 3  . valsartan-hydrochlorothiazide (DIOVAN-HCT) 160-25 MG tablet Take 1 tablet by mouth daily. 30 tablet 6   No current facility-administered medications on file prior to visit.     Review of Systems  Constitutional: Negative for activity change, appetite change, fatigue, fever and unexpected weight change.  HENT: Negative for congestion, rhinorrhea, sore throat and trouble swallowing.   Eyes: Negative for pain, redness, itching and visual disturbance.  Respiratory: Negative for cough, chest tightness, shortness of breath and wheezing.   Cardiovascular: Negative for chest pain and palpitations.  Gastrointestinal: Negative for abdominal pain, blood in stool, constipation, diarrhea and nausea.  Endocrine: Negative for cold intolerance, heat intolerance, polydipsia and polyuria.  Genitourinary: Positive for frequency and urgency. Negative for difficulty urinating, dysuria and hematuria.  Musculoskeletal: Negative for arthralgias, joint swelling and myalgias.  Skin: Negative for pallor and rash.  Neurological: Negative for dizziness, tremors, weakness, numbness and headaches.  Hematological: Negative for adenopathy. Does not bruise/bleed easily.  Psychiatric/Behavioral: Negative for decreased concentration and dysphoric mood. The patient is not nervous/anxious.        Objective:   Physical Exam  Constitutional: He appears well-developed and well-nourished. No distress.  Well appearing   HENT:  Head: Normocephalic and atraumatic.  Mouth/Throat: Oropharynx is clear and moist.  HOH Not wearing hearing aides today (enc him to start using them)     Eyes: Conjunctivae and EOM are normal. Pupils are equal, round, and reactive to light. Right eye exhibits no discharge. Left eye exhibits no discharge. No scleral icterus.  Neck: Normal range of motion. Neck supple. No JVD present. Carotid bruit is not present. No thyromegaly present.  No goiter  Cardiovascular: Normal rate, regular rhythm, normal heart sounds and intact distal pulses. Exam reveals no gallop.  Pulmonary/Chest: Effort normal and breath sounds normal. No respiratory distress. He has no wheezes. He has no rales.  No crackles  Abdominal: Soft. He exhibits no distension and no abdominal bruit. There is no tenderness.  Musculoskeletal: He exhibits no edema.  Lymphadenopathy:    He has no cervical adenopathy.  Neurological: He is alert. He has normal reflexes. No cranial nerve deficit. He exhibits normal muscle tone. Coordination normal.  Skin: Skin is warm and dry. No rash noted. No pallor.  Psychiatric: He has a normal mood and affect.          Assessment & Plan:   Problem List Items Addressed This Visit      Cardiovascular and Mediastinum   Essential hypertension    bp in fair control at this time (improved today) BP Readings from Last 1 Encounters:  05/01/17 126/78   No changes needed Disc lifstyle change with low sodium diet and exercise  Enc him to keep walking          Digestive   GERD (gastroesophageal reflux disease)    Much improved with re start of protonix Urged him to continue this-esp with hx of pud and esophagitis in the past         Genitourinary   BPH (benign prostatic hyperplasia)    No imp in voiding symptoms with inc in flomax dose  psa also inc this year  ? Unsure if his brother may have had prostate cancer  Lab Results  Component Value Date   PSA 2.71 03/26/2017   PSA 1.61 01/26/2016   PSA 1.21 10/23/2014   Ref to urol for further eval /tx       Relevant Orders  Ambulatory referral to Urology     Other   Hyperglycemia     Lab Results  Component Value Date   HGBA1C 6.0 03/26/2017   disc imp of low glycemic diet and wt loss to prevent DM2       Hyperlipidemia    Disc goals for lipids and reasons to control them Rev labs with pt Rev low sat fat diet in detail Good control with statin and diet       Iron deficiency anemia    Nl cbc last check Re assuring  Continue to follow      Low TSH level - Primary    Lab Results  Component Value Date   TSH 0.20 (L) 03/26/2017   Re check this with FT4 today  No symptoms at all       Relevant Orders   TSH   T4, Free

## 2017-05-01 NOTE — Assessment & Plan Note (Signed)
Lab Results  Component Value Date   TSH 0.20 (L) 03/26/2017   Re check this with FT4 today  No symptoms at all

## 2017-05-01 NOTE — Assessment & Plan Note (Signed)
No imp in voiding symptoms with inc in flomax dose  psa also inc this year  ? Unsure if his brother may have had prostate cancer  Lab Results  Component Value Date   PSA 2.71 03/26/2017   PSA 1.61 01/26/2016   PSA 1.21 10/23/2014   Ref to urol for further eval /tx

## 2017-05-01 NOTE — Patient Instructions (Addendum)
We will refer you to urology for prostate issues   Re checking thyroid numbers today   Take care of yourself  Get back to walking also   Your blood sugar was borderline- watch sugar in diet  Try to get most of your carbohydrates from produce (with the exception of white potatoes)  Eat less bread/pasta/rice/snack foods/cereals/sweets and other items from the middle of the grocery store (processed carbs)   Cholesterol is very good   No anemia -that is also re assuring

## 2017-05-01 NOTE — Assessment & Plan Note (Signed)
Lab Results  Component Value Date   HGBA1C 6.0 03/26/2017   disc imp of low glycemic diet and wt loss to prevent DM2

## 2017-05-01 NOTE — Assessment & Plan Note (Signed)
Disc goals for lipids and reasons to control them Rev labs with pt Rev low sat fat diet in detail Good control with statin and diet

## 2017-05-01 NOTE — Assessment & Plan Note (Signed)
Nl cbc last check Re assuring  Continue to follow

## 2017-05-01 NOTE — Assessment & Plan Note (Signed)
bp in fair control at this time (improved today) BP Readings from Last 1 Encounters:  05/01/17 126/78   No changes needed Disc lifstyle change with low sodium diet and exercise  Enc him to keep walking

## 2017-05-20 IMAGING — CR DG CHEST 2V
2 series · 2 of 2 positions shown · non-contrast
Comparison: 03/06/2012

CLINICAL DATA: Chest pain

EXAM:
CHEST  2 VIEW

[w chest pa]
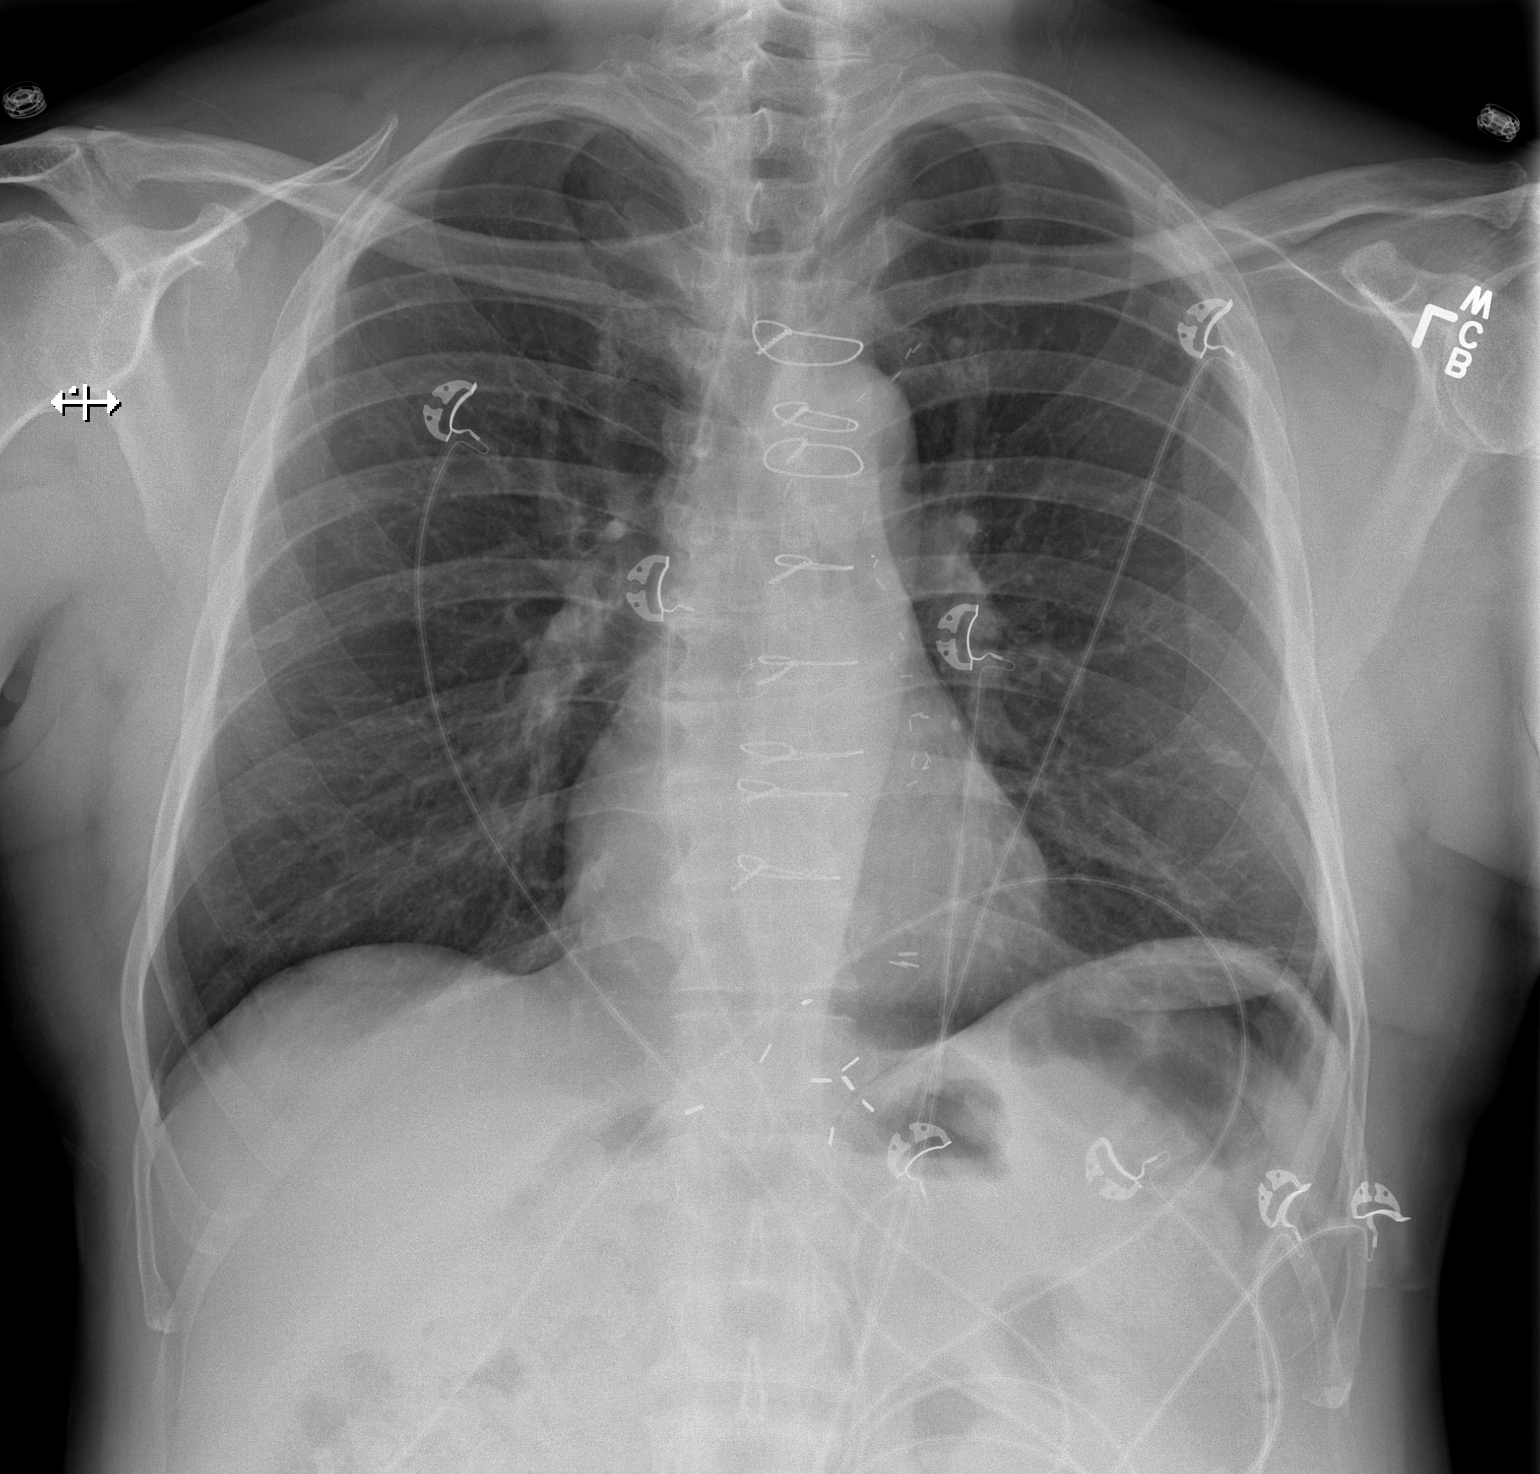

[w chest lat]
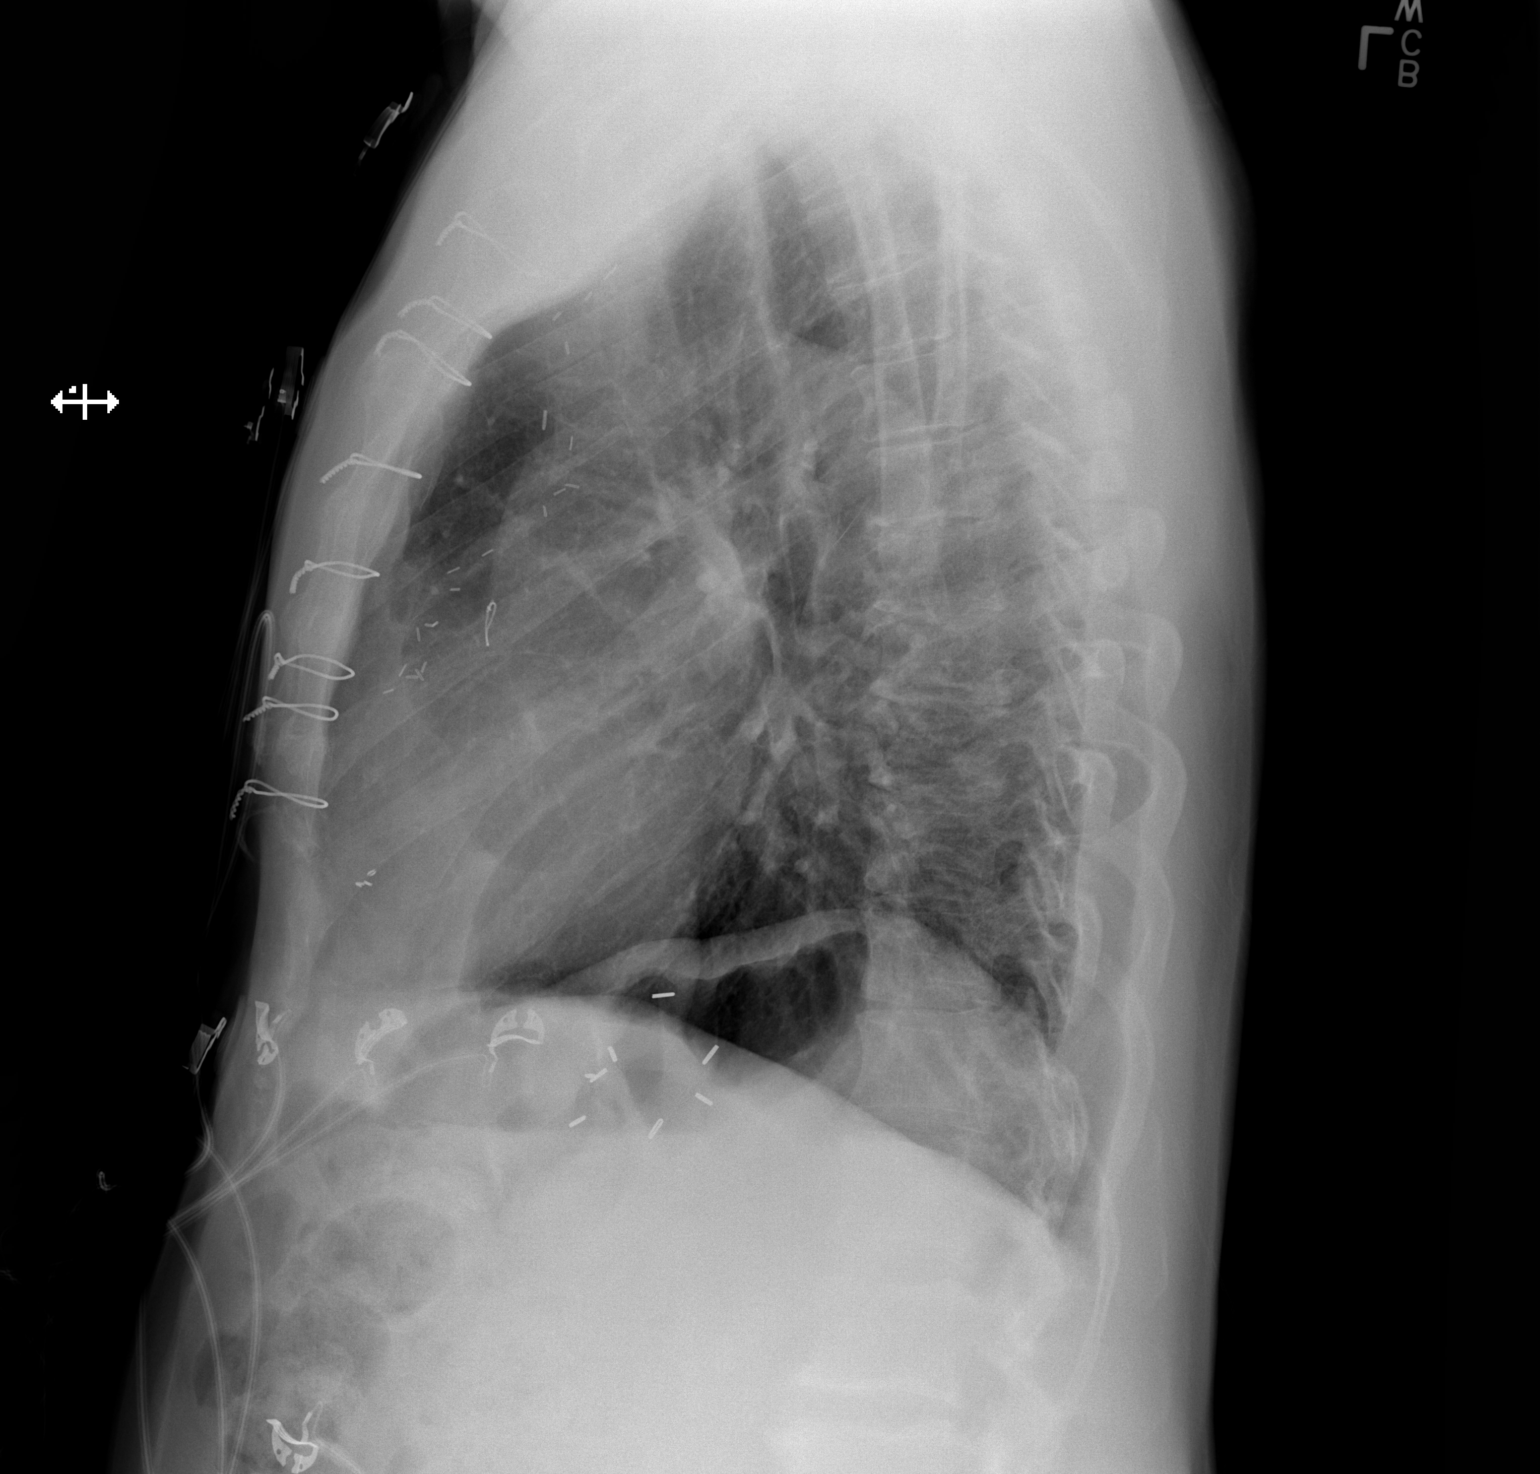

[2 of 2 positions shown; findings below may reference images not displayed]

FINDINGS: Prior CABG. Heart and mediastinal contours are within normal limits.
No focal opacities or effusions. No acute bony abnormality.
IMPRESSION: No active cardiopulmonary disease.

## 2017-05-22 ENCOUNTER — Telehealth: Payer: Self-pay | Admitting: Family Medicine

## 2017-05-22 DIAGNOSIS — R972 Elevated prostate specific antigen [PSA]: Secondary | ICD-10-CM | POA: Diagnosis not present

## 2017-05-22 DIAGNOSIS — N401 Enlarged prostate with lower urinary tract symptoms: Secondary | ICD-10-CM | POA: Diagnosis not present

## 2017-05-23 ENCOUNTER — Other Ambulatory Visit: Payer: Self-pay | Admitting: Family Medicine

## 2017-05-24 ENCOUNTER — Telehealth: Payer: Self-pay | Admitting: Family Medicine

## 2017-05-24 MED ORDER — TAMSULOSIN HCL 0.4 MG PO CAPS: ORAL_CAPSULE | ORAL | 3 refills | Status: DC

## 2017-05-24 NOTE — Telephone Encounter (Signed)
I spoke with Melonie at [facility] and med pt taking once daily instead of taking 2 pills daily is tamsulosin.Please advise. Pt seen 05/01/17.

## 2017-05-24 NOTE — Addendum Note (Signed)
Addended by: Tammi Sou on: 05/24/2017 04:04 PM   Modules accepted: Orders

## 2017-05-24 NOTE — Telephone Encounter (Signed)
Copied from [facility]. Topic: Quick Communication - Rx Refill/Question >> May 24, 2017  2:38 PM Corie Chiquito, [place] wrote: Medication: Tamsulosin   Has the patient contacted their pharmacy? Yes   Patient has been in contact with his pharmacy and still hasn't received his refill. Stated that he has been out of medication for 4 days now  Preferred Pharmacy (with phone number or street name): [facility] [id number] [place]. [place] [phone]   Agent: Please be advised that RX refills may take up to 3 business days. We ask that you follow-up with your pharmacy.

## 2017-05-24 NOTE — Telephone Encounter (Signed)
Call to pharmacy- Rx is on file. Call to patient- informed rx is being filled for him now.

## 2017-05-24 NOTE — Telephone Encounter (Signed)
That's fine I tried to re send it but epic wont let me drop it down Please change do daily and refill for a year  thanks

## 2017-05-24 NOTE — Telephone Encounter (Signed)
Amber [facility] called in. She said that pt says that he does not take medication 2 times a day (as directed on bottle) pt would like to have a Rx for the 1 time a day like he take medication.   Made pharmacy aware of reason for decline, she says that pt would like to have a new Rx sent in with correct direction.

## 2017-05-24 NOTE — Telephone Encounter (Signed)
Rx sent 

## 2017-05-30 DIAGNOSIS — R972 Elevated prostate specific antigen [PSA]: Secondary | ICD-10-CM | POA: Diagnosis not present

## 2017-06-27 ENCOUNTER — Ambulatory Visit (HOSPITAL_COMMUNITY)
Admission: RE | Admit: 2017-06-27 | Discharge: 2017-06-27 | Disposition: A | Discharge status: Home / Self Care | Payer: Medicare Other | Source: Ambulatory Visit | Attending: Sports Medicine | Admitting: Sports Medicine

## 2017-06-27 ENCOUNTER — Other Ambulatory Visit (HOSPITAL_COMMUNITY): Payer: Self-pay | Admitting: Sports Medicine

## 2017-06-27 DIAGNOSIS — M79604 Pain in right leg: Secondary | ICD-10-CM

## 2017-06-27 DIAGNOSIS — M7989 Other specified soft tissue disorders: Principal | ICD-10-CM

## 2017-06-27 DIAGNOSIS — M25561 Pain in right knee: Secondary | ICD-10-CM | POA: Diagnosis not present

## 2017-06-27 NOTE — Progress Notes (Signed)
Preliminary notes by tech--Right lower extremity venous duplex exam completed. No evidence of deep or superficial veins thrombosis.  No Baker's cyst seen.  Incidental finding: a possible muscle torn noticed on medial calf region. Result notified Dr. Alfonso Ramus.   [place] Nathania Waldman(RDMS, RVT)

## 2017-07-26 ENCOUNTER — Other Ambulatory Visit: Payer: Self-pay | Admitting: *Deleted

## 2017-07-26 MED ORDER — ATORVASTATIN CALCIUM 10 MG PO TABS: 10.0000 mg | ORAL_TABLET | Freq: Every day | ORAL | 2 refills | Status: DC

## 2017-08-02 ENCOUNTER — Other Ambulatory Visit: Payer: Self-pay | Admitting: Cardiovascular Disease

## 2017-10-01 DIAGNOSIS — H401133 Primary open-angle glaucoma, bilateral, severe stage: Secondary | ICD-10-CM | POA: Diagnosis not present

## 2017-11-12 DIAGNOSIS — H401131 Primary open-angle glaucoma, bilateral, mild stage: Secondary | ICD-10-CM | POA: Diagnosis not present

## 2017-11-12 DIAGNOSIS — R972 Elevated prostate specific antigen [PSA]: Secondary | ICD-10-CM | POA: Diagnosis not present

## 2017-11-21 DIAGNOSIS — R972 Elevated prostate specific antigen [PSA]: Secondary | ICD-10-CM | POA: Diagnosis not present

## 2017-11-21 DIAGNOSIS — N401 Enlarged prostate with lower urinary tract symptoms: Secondary | ICD-10-CM | POA: Diagnosis not present

## 2018-01-01 ENCOUNTER — Other Ambulatory Visit: Payer: Self-pay | Admitting: Cardiovascular Disease

## 2018-02-08 DIAGNOSIS — H35352 Cystoid macular degeneration, left eye: Secondary | ICD-10-CM | POA: Diagnosis not present

## 2018-02-08 DIAGNOSIS — H26492 Other secondary cataract, left eye: Secondary | ICD-10-CM | POA: Diagnosis not present

## 2018-02-08 DIAGNOSIS — H401121 Primary open-angle glaucoma, left eye, mild stage: Secondary | ICD-10-CM | POA: Diagnosis not present

## 2018-03-24 ENCOUNTER — Other Ambulatory Visit: Payer: Self-pay | Admitting: Physician Assistant

## 2018-03-26 ENCOUNTER — Other Ambulatory Visit: Payer: Self-pay | Admitting: *Deleted

## 2018-03-26 ENCOUNTER — Telehealth: Payer: Self-pay | Admitting: Family Medicine

## 2018-03-26 MED ORDER — PANTOPRAZOLE SODIUM 40 MG PO TBEC: 40.0000 mg | DELAYED_RELEASE_TABLET | Freq: Every day | ORAL | 0 refills | Status: DC

## 2018-03-26 NOTE — Telephone Encounter (Signed)
I left a detailed message on patient's home answering machine. [facility] just filled a med for pt and per Dr. Glori Bickers pt needs a f/u or CPE in Jan or anytime after that.

## 2018-03-26 NOTE — Telephone Encounter (Signed)
Last OV was 05/01/17, and no future appts., please advise

## 2018-03-26 NOTE — Telephone Encounter (Signed)
Jerry Farrell will reach out to pt to schedule appt and med filled once

## 2018-03-26 NOTE — Telephone Encounter (Signed)
Please schedule f/u or PE late jan or later and refill until then

## 2018-03-27 NOTE — Telephone Encounter (Signed)
Pt appointment 1/15 with lisa and dr tower

## 2018-05-01 ENCOUNTER — Other Ambulatory Visit: Payer: Self-pay | Admitting: *Deleted

## 2018-05-01 MED ORDER — ATORVASTATIN CALCIUM 10 MG PO TABS: 10.0000 mg | ORAL_TABLET | Freq: Every day | ORAL | 1 refills | Status: DC

## 2018-05-08 ENCOUNTER — Ambulatory Visit (INDEPENDENT_AMBULATORY_CARE_PROVIDER_SITE_OTHER): Payer: Medicare Other

## 2018-05-08 ENCOUNTER — Encounter: Payer: Medicare Other | Admitting: Family Medicine

## 2018-05-08 VITALS — BP 122/78 | HR 63 | Temp 98.0°F | Ht 69.0 in | Wt 185.8 lb

## 2018-05-08 DIAGNOSIS — R7989 Other specified abnormal findings of blood chemistry: Secondary | ICD-10-CM

## 2018-05-08 DIAGNOSIS — I10 Essential (primary) hypertension: Secondary | ICD-10-CM | POA: Diagnosis not present

## 2018-05-08 DIAGNOSIS — Z125 Encounter for screening for malignant neoplasm of prostate: Secondary | ICD-10-CM | POA: Diagnosis not present

## 2018-05-08 DIAGNOSIS — Z0000 Encounter for general adult medical examination without abnormal findings: Secondary | ICD-10-CM

## 2018-05-08 DIAGNOSIS — E785 Hyperlipidemia, unspecified: Secondary | ICD-10-CM | POA: Diagnosis not present

## 2018-05-08 DIAGNOSIS — R739 Hyperglycemia, unspecified: Secondary | ICD-10-CM

## 2018-05-08 DIAGNOSIS — D509 Iron deficiency anemia, unspecified: Secondary | ICD-10-CM

## 2018-05-08 DIAGNOSIS — E538 Deficiency of other specified B group vitamins: Secondary | ICD-10-CM | POA: Diagnosis not present

## 2018-05-08 LAB — CBC WITH DIFFERENTIAL/PLATELET
Basophils Absolute: 0 10*3/uL (ref 0.0–0.1)
Basophils Relative: 0.5 % (ref 0.0–3.0)
EOS ABS: 0.1 10*3/uL (ref 0.0–0.7)
Eosinophils Relative: 1.1 % (ref 0.0–5.0)
HCT: 47 % (ref 39.0–52.0)
Hemoglobin: 16.2 g/dL (ref 13.0–17.0)
Lymphocytes Relative: 16.8 % (ref 12.0–46.0)
Lymphs Abs: 1.4 10*3/uL (ref 0.7–4.0)
MCHC: 34.4 g/dL (ref 30.0–36.0)
MCV: 86 fl (ref 78.0–100.0)
MONO ABS: 0.7 10*3/uL (ref 0.1–1.0)
Monocytes Relative: 8.5 % (ref 3.0–12.0)
Neutro Abs: 6.1 10*3/uL (ref 1.4–7.7)
Neutrophils Relative %: 73.1 % (ref 43.0–77.0)
PLATELETS: 173 10*3/uL (ref 150.0–400.0)
RBC: 5.47 Mil/uL (ref 4.22–5.81)
RDW: 13.6 % (ref 11.5–15.5)
WBC: 8.4 10*3/uL (ref 4.0–10.5)

## 2018-05-08 LAB — TSH: TSH: 0.4 u[IU]/mL (ref 0.35–4.50)

## 2018-05-08 LAB — LIPID PANEL
Cholesterol: 176 mg/dL (ref 0–200)
HDL: 47.8 mg/dL (ref >39.00)
NonHDL: 127.7
Total CHOL/HDL Ratio: 4
Triglycerides: 219 mg/dL — ABNORMAL HIGH (ref 0.0–149.0)
VLDL: 43.8 mg/dL — ABNORMAL HIGH (ref 0.0–40.0)

## 2018-05-08 LAB — COMPREHENSIVE METABOLIC PANEL
ALT: 18 U/L (ref 0–53)
AST: 19 U/L (ref 0–37)
Albumin: 4.2 g/dL (ref 3.5–5.2)
Alkaline Phosphatase: 99 U/L (ref 39–117)
BUN: 18 mg/dL (ref 6–23)
CO2: 28 mEq/L (ref 19–32)
Calcium: 9.3 mg/dL (ref 8.4–10.5)
Chloride: 99 mEq/L (ref 96–112)
Creatinine, Ser: 1.27 mg/dL (ref 0.40–1.50)
GFR: 71.13 mL/min (ref >60.00)
Glucose, Bld: 101 mg/dL — ABNORMAL HIGH (ref 70–99)
Potassium: 3.7 mEq/L (ref 3.5–5.1)
Sodium: 135 mEq/L (ref 135–145)
Total Bilirubin: 1.1 mg/dL (ref 0.2–1.2)
Total Protein: 7 g/dL (ref 6.0–8.3)

## 2018-05-08 LAB — FERRITIN: Ferritin: 51.6 ng/mL (ref 22.0–322.0)

## 2018-05-08 LAB — LDL CHOLESTEROL, DIRECT: Direct LDL: 80 mg/dL

## 2018-05-08 LAB — T4, FREE: Free T4: 1.07 ng/dL (ref 0.60–1.60)

## 2018-05-08 LAB — VITAMIN B12: Vitamin B-12: >1500 pg/mL — ABNORMAL HIGH (ref 211–911)

## 2018-05-08 LAB — HEMOGLOBIN A1C: Hgb A1c MFr Bld: 5.8 % (ref 4.6–6.5)

## 2018-05-08 LAB — PSA, MEDICARE: PSA: 3.19 ng/mL (ref 0.10–4.00)

## 2018-05-08 NOTE — Patient Instructions (Signed)
Jerry Farrell , Thank you for taking time to come for your Medicare Wellness Visit. I appreciate your ongoing commitment to your health goals. Please review the following plan we discussed and let me know if I can assist you in the future.   These are the goals we discussed: Goals    . Increase physical activity     Starting 05/08/2018, I will continue to bowl for at least 2 hours one day per week.        This is a list of the screening recommended for you and due dates:  Health Maintenance  Topic Date Due  . Tetanus Vaccine  11/25/2017  . Colon Cancer Screening  02/14/2021  . Flu Shot  Completed  .  Hepatitis C: One time screening is recommended by Center for Disease Control  (CDC) for  adults born from [birth year] through 1967.   Completed  . Pneumonia vaccines  Completed   Preventive Care for Adults  A healthy lifestyle and preventive care can promote health and wellness. Preventive health guidelines for adults include the following key practices.  . A routine yearly physical is a good way to check with your health care provider about your health and preventive screening. It is a chance to share any concerns and updates on your health and to receive a thorough exam.  . Visit your dentist for a routine exam and preventive care every 6 months. Brush your teeth twice a day and floss once a day. Good oral hygiene prevents tooth decay and gum disease.  . The frequency of eye exams is based on your age, health, family medical history, use  of contact lenses, and other factors. Follow your health care provider's recommendations for frequency of eye exams.  . Eat a healthy diet. Foods like vegetables, fruits, whole grains, low-fat dairy products, and lean protein foods contain the nutrients you need without too many calories. Decrease your intake of foods high in solid fats, added sugars, and salt. Eat the right amount of calories for you. Get information about a proper diet from your health care  provider, if necessary.  . Regular physical exercise is one of the most important things you can do for your health. Most adults should get at least 150 minutes of moderate-intensity exercise (any activity that increases your heart rate and causes you to sweat) each week. In addition, most adults need muscle-strengthening exercises on 2 or more days a week.  Silver Sneakers may be a benefit available to you. To determine eligibility, you may visit the website: www.silversneakers.com or contact program at (854)363-6467 Mon-Fri between 8AM-8PM.   . Maintain a healthy weight. The body mass index (BMI) is a screening tool to identify possible weight problems. It provides an estimate of body fat based on height and weight. Your health care provider can find your BMI and can help you achieve or maintain a healthy weight.   For adults 20 years and older: ? A BMI below 18.5 is considered underweight. ? A BMI of 18.5 to 24.9 is normal. ? A BMI of 25 to 29.9 is considered overweight. ? A BMI of 30 and above is considered obese.   . Maintain normal blood lipids and cholesterol levels by exercising and minimizing your intake of saturated fat. Eat a balanced diet with plenty of fruit and vegetables. Blood tests for lipids and cholesterol should begin at age 46 and be repeated every 5 years. If your lipid or cholesterol levels are high, you are over 50,  or you are at high risk for heart disease, you may need your cholesterol levels checked more frequently. Ongoing high lipid and cholesterol levels should be treated with medicines if diet and exercise are not working.  . If you smoke, find out from your health care provider how to quit. If you do not use tobacco, please do not start.  . If you choose to drink alcohol, please do not consume more than 2 drinks per day. One drink is considered to be 12 ounces (355 mL) of beer, 5 ounces (148 mL) of wine, or 1.5 ounces (44 mL) of liquor.  . If you are 29-79 years  old, ask your health care provider if you should take aspirin to prevent strokes.  . Use sunscreen. Apply sunscreen liberally and repeatedly throughout the day. You should seek shade when your shadow is shorter than you. Protect yourself by wearing long sleeves, pants, a wide-brimmed hat, and sunglasses year round, whenever you are outdoors.  . Once a month, do a whole body skin exam, using a mirror to look at the skin on your back. Tell your health care provider of new moles, moles that have irregular borders, moles that are larger than a pencil eraser, or moles that have changed in shape or color.

## 2018-05-08 NOTE — Progress Notes (Signed)
PCP notes:   Health maintenance:  Tetanus vaccine - postponed/insurance  Abnormal screenings:   None  Patient concerns:   None  Nurse concerns:  None  Next PCP appt:   05/16/18 @ 0800

## 2018-05-08 NOTE — Progress Notes (Signed)
Subjective:   Jerry Farrell is a 73 y.o. male who presents for Medicare Annual/Subsequent preventive examination.  Review of Systems:  N/A Cardiac Risk Factors include: advanced age (>61men, >46 women);dyslipidemia;hypertension;male gender     Objective:    Vitals: BP 122/78 (BP Location: Right Arm, Patient Position: Sitting, Cuff Size: Normal)   Pulse 63   Temp 98 F (36.7 C) (Oral)   Ht 5\' 9"  (1.753 m) Comment: shoes  Wt 185 lb 12 oz (84.3 kg)   SpO2 97%   BMI 27.43 kg/m   Body mass index is 27.43 kg/m.  Advanced Directives 05/08/2018 06/06/2016 06/06/2016 04/09/2016 01/26/2016 03/06/2012  Does Patient Have a Medical Advance Directive? No No No No No Patient would not like information  Would patient like information on creating a medical advance directive? No - Patient declined No - Patient declined - - No - patient declined information -    Tobacco Social History   Tobacco Use  Smoking Status Never Smoker  Smokeless Tobacco Never Used     Counseling given: No   Clinical Intake:  Pre-visit preparation completed: Yes  Pain : No/denies pain Pain Score: 0-No pain     Nutritional Status: BMI 25 -29 Overweight Nutritional Risks: None Diabetes: No  How often do you need to have someone help you when you read instructions, pamphlets, or other written materials from your doctor or pharmacy?: 1 - Never What is the last grade level you completed in school?: 12th grade  Interpreter Needed?: No  Comments: pt lives with spouse Information entered by :: LPinson, LPN  Past Medical History:  Diagnosis Date  . BPH (benign prostatic hyperplasia)   . CAD (coronary artery disease)    cardiolite ok 3/05, stress test- low risk study 11/06  . Chronic low back pain    with radiculopathy  . ED (erectile dysfunction)   . Gallstones    abd [place]- stable hamangioma, gallbladder sludge and tiny stones 09/2003  . GERD (gastroesophageal reflux disease)   . Hyperlipidemia   .  Hypertension   . Transfusion history    Past Surgical History:  Procedure Laterality Date  . CATARACT EXTRACTION W/PHACO  03/13/2012   Procedure: CATARACT EXTRACTION PHACO AND INTRAOCULAR LENS PLACEMENT (IOC);  Surgeon: Adonis Brook, MD;  Location: [facility];  Service: Ophthalmology;  Laterality: Left;  . CORONARY ARTERY BYPASS GRAFT  1998   x 1  [facility]  . ESOPHAGOGASTRODUODENOSCOPY (EGD) WITH PROPOFOL N/A 06/07/2016   Procedure: ESOPHAGOGASTRODUODENOSCOPY (EGD) WITH PROPOFOL;  Surgeon: Mauri Pole, MD;  [facility];  Service: Endoscopy;  Laterality: N/A;  . GASTRECTOMY     partial  . MEMBRANE PEEL  03/13/2012   Procedure: MEMBRANE PEEL;  Surgeon: Adonis Brook, MD;  Location: [facility];  Service: Ophthalmology;  Laterality: Left;  . PARS PLANA VITRECTOMY  03/13/2012   Procedure: PARS PLANA VITRECTOMY WITH 23 GAUGE;  Surgeon: Adonis Brook, MD;  Location: [facility];  Service: Ophthalmology;  Laterality: Left;   Family History  Problem Relation Age of Onset  . Cancer Sister        unknown type, pt feels alcohol related  . Colon cancer Brother   . Throat cancer Brother        pt feels alcohol related  . Colon cancer Brother        1/2 brother  . Hypertension Mother   . Stomach cancer Neg Hx   . Pancreatic cancer Neg Hx    Social History  Socioeconomic History  . Marital status: Married    Spouse name: Not on file  . Number of children: 3  . Years of education: Not on file  . Highest education level: Not on file  Occupational History  . Occupation: Mining engineer: RETIRED  Social Needs  . Financial resource strain: Not on file  . Food insecurity:    Worry: Not on file    Inability: Not on file  . Transportation needs:    Medical: Not on file    Non-medical: Not on file  Tobacco Use  . Smoking status: Never Smoker  . Smokeless tobacco: Never Used  Substance and Sexual Activity  . Alcohol use: No    Alcohol/week: 0.0 standard drinks  . Drug  use: No  . Sexual activity: Yes  Lifestyle  . Physical activity:    Days per week: Not on file    Minutes per session: Not on file  . Stress: Not on file  Relationships  . Social connections:    Talks on phone: Not on file    Gets together: Not on file    Attends religious service: Not on file    Active member of club or organization: Not on file    Attends meetings of clubs or organizations: Not on file    Relationship status: Not on file  Other Topics Concern  . Not on file  Social History Narrative  . Not on file    Outpatient Encounter Medications as of 05/08/2018  Medication Sig  . aspirin EC 81 MG tablet Take 1 tablet (81 mg total) by mouth daily.  Marland Kitchen atorvastatin (LIPITOR) 10 MG tablet Take 1 tablet (10 mg total) by mouth daily.  . carvedilol (COREG) 3.125 MG tablet TAKE 1 TABLET (3.125 MG TOTAL) BY MOUTH 2 (TWO) TIMES DAILY WITH A MEAL.  Marland Kitchen dorzolamide-timolol (COSOPT) 22.3-6.8 MG/ML ophthalmic solution Place 1 drop into the left eye daily. Use as directed   . ferrous sulfate 325 (65 FE) MG tablet Take 1 tablet (325 mg total) by mouth 2 (two) times daily with a meal.  . pantoprazole (PROTONIX) 40 MG tablet Take 1 tablet (40 mg total) by mouth daily.  . tamsulosin (FLOMAX) 0.4 MG CAPS capsule Take 1 capsules by mouth once daily  . valsartan-hydrochlorothiazide (DIOVAN-HCT) 160-25 MG tablet TAKE 1 TABLET BY MOUTH EVERY DAY  . [DISCONTINUED] brimonidine (ALPHAGAN) 0.2 % ophthalmic solution INSTILL 1 DROP IN LEFT EYE TWICE DAILY   No facility-administered encounter medications on file as of 05/08/2018.     Activities of Daily Living In your present state of health, do you have any difficulty performing the following activities: 05/08/2018  Hearing? Y  Vision? N  Difficulty concentrating or making decisions? N  Walking or climbing stairs? N  Dressing or bathing? N  Doing errands, shopping? N  Preparing Food and eating ? N  Using the Toilet? N  In the past six months, have  you accidently leaked urine? N  Do you have problems with loss of bowel control? N  Managing your Medications? N  Managing your Finances? N  Housekeeping or managing your Housekeeping? N  Some recent data might be hidden    Patient Care Team: Tower, Wynelle Fanny, MD as PCP - General Gwenlyn Found Pearletha Forge, MD as Consulting Physician (Cardiology) Camillo Flaming, OD as Consulting Physician (Optometry)   Assessment:   This is a routine wellness examination for [facility].  Hearing Screening Comments: Bilateral hearing aids Vision Screening Comments: Vision  exam in Nov 2019 with Dr. Koop/Dr. Nancy Fetter  Exercise Activities and Dietary recommendations Current Exercise Habits: Home exercise routine, Type of exercise: Other - see comments(bowling 2 hours), Time (Minutes): > 60, Frequency (Times/Week): 1, Weekly Exercise (Minutes/Week): 0, Intensity: Moderate, Exercise limited by: None identified  Goals    . Increase physical activity     Starting 05/08/2018, I will continue to bowl for at least 2 hours one day per week.        Fall Risk Fall Risk  05/08/2018 03/26/2017 01/26/2016 10/28/2014 08/19/2013  Falls in the past year? 0 No No No No   Depression Screen PHQ 2/9 Scores 05/08/2018 03/26/2017 01/26/2016 10/28/2014  PHQ - 2 Score 0 0 0 0  PHQ- 9 Score 0 - - -    Cognitive Function MMSE - Mini Mental State Exam 05/08/2018 01/26/2016  Orientation to time 5 5  Orientation to Place 5 5  Registration 3 3  Attention/ Calculation 0 0  Recall 3 3  Language- name 2 objects 0 0  Language- repeat 1 1  Language- follow 3 step command 3 3  Language- read & follow direction 0 0  Write a sentence 0 0  Copy design 0 0  Total score 20 20     PLEASE NOTE: A Mini-Cog screen was completed. Maximum score is 20. A value of 0 denotes this part of Folstein MMSE was not completed or the patient failed this part of the Mini-Cog screening.   Mini-Cog Screening Orientation to Time - Max 5 pts Orientation to Place - Max 5  pts Registration - Max 3 pts Recall - Max 3 pts Language Repeat - Max 1 pts Language Follow 3 Step Command - Max 3 pts     Immunization History  Administered Date(s) Administered  . Influenza Split 03/17/2011, 04/30/2012  . Influenza, High Dose Seasonal PF 02/20/2018  . Influenza,inj,Quad PF,6+ Mos 04/08/2013, 05/28/2015, 01/26/2016, 03/26/2017  . Influenza-Unspecified 02/22/2014  . Pneumococcal Conjugate-13 10/28/2014  . Pneumococcal Polysaccharide-23 04/30/2012  . Td 08/22/1997, 11/26/2007  . Zoster 08/19/2013    Screening Tests Health Maintenance  Topic Date Due  . TETANUS/TDAP  11/25/2017  . COLONOSCOPY  02/14/2021  . INFLUENZA VACCINE  Completed  . Hepatitis C Screening  Completed  . PNA vac Low Risk Adult  Completed       Plan:     I have personally reviewed, addressed, and noted the following in the patient's chart:  A. Medical and social history B. Use of alcohol, tobacco or illicit drugs  C. Current medications and supplements D. Functional ability and status E.  Nutritional status F.  Physical activity G. Advance directives H. List of other physicians I.  Hospitalizations, surgeries, and ER visits in previous 12 months J.  [place] to include hearing, vision, cognitive, depression L. Referrals and appointments - none  In addition, I have reviewed and discussed with patient certain preventive protocols, quality metrics, and best practice recommendations. A written personalized care plan for preventive services as well as general preventive health recommendations were provided to patient.  See attached scanned questionnaire for additional information.   Signed,   Lindell Noe, MHA, BS, LPN Health Coach

## 2018-05-15 NOTE — Progress Notes (Signed)
I reviewed health advisor's note, was available for consultation, and agree with documentation and plan.  

## 2018-05-16 ENCOUNTER — Ambulatory Visit: Payer: Medicare Other | Admitting: Family Medicine

## 2018-05-20 DIAGNOSIS — H401131 Primary open-angle glaucoma, bilateral, mild stage: Secondary | ICD-10-CM | POA: Diagnosis not present

## 2018-05-20 DIAGNOSIS — H04121 Dry eye syndrome of right lacrimal gland: Secondary | ICD-10-CM | POA: Diagnosis not present

## 2018-05-20 DIAGNOSIS — H35372 Puckering of macula, left eye: Secondary | ICD-10-CM | POA: Diagnosis not present

## 2018-05-21 ENCOUNTER — Encounter: Payer: Self-pay | Admitting: Family Medicine

## 2018-05-21 ENCOUNTER — Ambulatory Visit (INDEPENDENT_AMBULATORY_CARE_PROVIDER_SITE_OTHER): Payer: Medicare Other | Admitting: Family Medicine

## 2018-05-21 VITALS — BP 130/80 | HR 60 | Temp 98.2°F | Ht 69.0 in

## 2018-05-21 DIAGNOSIS — I10 Essential (primary) hypertension: Secondary | ICD-10-CM

## 2018-05-21 DIAGNOSIS — E785 Hyperlipidemia, unspecified: Secondary | ICD-10-CM

## 2018-05-21 DIAGNOSIS — K219 Gastro-esophageal reflux disease without esophagitis: Secondary | ICD-10-CM

## 2018-05-21 DIAGNOSIS — N400 Enlarged prostate without lower urinary tract symptoms: Secondary | ICD-10-CM | POA: Diagnosis not present

## 2018-05-21 DIAGNOSIS — E538 Deficiency of other specified B group vitamins: Secondary | ICD-10-CM | POA: Diagnosis not present

## 2018-05-21 DIAGNOSIS — Z0000 Encounter for general adult medical examination without abnormal findings: Secondary | ICD-10-CM

## 2018-05-21 DIAGNOSIS — R7303 Prediabetes: Secondary | ICD-10-CM | POA: Diagnosis not present

## 2018-05-21 DIAGNOSIS — D509 Iron deficiency anemia, unspecified: Secondary | ICD-10-CM

## 2018-05-21 MED ORDER — TAMSULOSIN HCL 0.4 MG PO CAPS: ORAL_CAPSULE | ORAL | 3 refills | Status: DC

## 2018-05-21 MED ORDER — ATORVASTATIN CALCIUM 10 MG PO TABS: 10.0000 mg | ORAL_TABLET | Freq: Every day | ORAL | 3 refills | Status: DC

## 2018-05-21 MED ORDER — PANTOPRAZOLE SODIUM 40 MG PO TBEC: 40.0000 mg | DELAYED_RELEASE_TABLET | Freq: Every day | ORAL | 3 refills | Status: DC

## 2018-05-21 NOTE — Assessment & Plan Note (Signed)
Reviewed health habits including diet and exercise and skin cancer prevention Reviewed appropriate screening tests for age  Also reviewed health mt list, fam hx and immunization status , as well as social and family history   See HPI amw rev  Labs rev  Recommend more exercise  Continue yearly urology appt

## 2018-05-21 NOTE — Assessment & Plan Note (Signed)
Nl cbc- seems to be resolved No GI issues

## 2018-05-21 NOTE — Assessment & Plan Note (Signed)
bp in fair control at this time  BP Readings from Last 1 Encounters:  05/21/18 130/80   No changes needed Most recent labs reviewed  Disc lifstyle change with low sodium diet and exercise

## 2018-05-21 NOTE — Assessment & Plan Note (Signed)
Doing very well with protonix

## 2018-05-21 NOTE — Assessment & Plan Note (Signed)
Lab Results  Component Value Date   PSA 3.19 05/08/2018   PSA 2.71 03/26/2017   PSA 1.61 01/26/2016   He sees urology yearly  His psa was lower in their office this summer

## 2018-05-21 NOTE — Progress Notes (Signed)
Subjective:    Patient ID: Jerry Farrell, male    DOB: 03-01-1946, 73 y.o.   MRN: [id number]  HPI Here for health maintenance exam and to review chronic medical problems   Doing well overall   Wt Readings from Last 3 Encounters:  05/08/18 185 lb 12 oz (84.3 kg)  05/01/17 183 lb 12 oz (83.3 kg)  04/11/17 186 lb (84.4 kg)  eating a lot  Not walking (but does bowling)  Staying active  27.43 kg/m    Had amw on 1/15 with Lesia  No gaps or concerns  Colonoscopy 10/17 with 5 y recall  Brother had colon cancer  No symptoms or bowel changes   zostavax 4/15  Prostate health Has BPH ? Brother with prostate cancer  Was ref to urology- seen in July (free psa 1.8) -will f/u ina year  Needs refill of flomax  Some nocturia when he drinks coffee   Lab Results  Component Value Date   PSA 3.19 05/08/2018   PSA 2.71 03/26/2017   PSA 1.61 01/26/2016    bp is stable today (high on first check)-has been fine  No cp or palpitations or headaches or edema  No side effects to medicines  BP Readings from Last 3 Encounters:  05/21/18 (!) 148/80  05/08/18 122/78  05/01/17 126/78      GERD with hx of peptic ulcer in the past On protonix  B12 def Lab Results  Component Value Date   VITAMINB12 >1500 (H) 05/08/2018  takes orally   Lab Results  Component Value Date   TSH 0.40 05/08/2018    Prediabetes  Lab Results  Component Value Date   HGBA1C 5.8 05/08/2018  improved from 6 He has cut back on sweets /eating better   Lab Results  Component Value Date   CREATININE 1.27 05/08/2018   BUN 18 05/08/2018   NA 135 05/08/2018   K 3.7 05/08/2018   CL 99 05/08/2018   CO2 28 05/08/2018   Lab Results  Component Value Date   ALT 18 05/08/2018   AST 19 05/08/2018   ALKPHOS 99 05/08/2018   BILITOT 1.1 05/08/2018   Lab Results  Component Value Date   WBC 8.4 05/08/2018   HGB 16.2 05/08/2018   HCT 47.0 05/08/2018   MCV 86.0 05/08/2018   PLT 173.0 05/08/2018     Cholesterol Lab Results  Component Value Date   CHOL 176 05/08/2018   CHOL 142 03/26/2017   CHOL 156 01/26/2016   Lab Results  Component Value Date   HDL 47.80 05/08/2018   HDL 51.00 03/26/2017   HDL 51.10 01/26/2016   Lab Results  Component Value Date   LDLCALC 67 03/26/2017   LDLCALC 67 01/26/2016   LDLCALC 62 10/23/2014   Lab Results  Component Value Date   TRIG 219.0 (H) 05/08/2018   TRIG 116.0 03/26/2017   TRIG 191.0 (H) 01/26/2016   Lab Results  Component Value Date   CHOLHDL 4 05/08/2018   CHOLHDL 3 03/26/2017   CHOLHDL 3 01/26/2016   Lab Results  Component Value Date   LDLDIRECT 80.0 05/08/2018  well controlled with statin and diet   Patient Active Problem List   Diagnosis Date Noted  . Peptic ulcer disease 06/14/2016  . Iron deficiency anemia 06/14/2016  . B12 deficiency 06/14/2016  . Prediabetes 01/31/2016  . Need for hepatitis C screening test 01/25/2016  . Post-nasal drip 12/10/2015  . GERD (gastroesophageal reflux disease) 07/20/2015  . Routine general medical examination  at a health care facility 10/28/2014  . Elevated alkaline phosphatase level 10/28/2014  . BPH (benign prostatic hyperplasia) 04/29/2014  . Prostate cancer screening 08/11/2013  . Encounter for Medicare annual wellness exam 08/11/2013  . Hyperlipidemia 01/09/2007  . ERECTILE DYSFUNCTION 01/09/2007  . Essential hypertension 01/09/2007  . Coronary atherosclerosis 01/09/2007  . ESOPHAGITIS 01/09/2007   Past Medical History:  Diagnosis Date  . BPH (benign prostatic hyperplasia)   . CAD (coronary artery disease)    cardiolite ok 3/05, stress test- low risk study 11/06  . Chronic low back pain    with radiculopathy  . ED (erectile dysfunction)   . Gallstones    abd [place]- stable hamangioma, gallbladder sludge and tiny stones 09/2003  . GERD (gastroesophageal reflux disease)   . Hyperlipidemia   . Hypertension   . Transfusion history    Past Surgical History:  Procedure  Laterality Date  . CATARACT EXTRACTION W/PHACO  03/13/2012   Procedure: CATARACT EXTRACTION PHACO AND INTRAOCULAR LENS PLACEMENT (IOC);  Surgeon: Adonis Brook, MD;  Location: [place];  Service: Ophthalmology;  Laterality: Left;  . CORONARY ARTERY BYPASS GRAFT  1998   x 1  [facility]  . ESOPHAGOGASTRODUODENOSCOPY (EGD) WITH PROPOFOL N/A 06/07/2016   Procedure: ESOPHAGOGASTRODUODENOSCOPY (EGD) WITH PROPOFOL;  Surgeon: Mauri Pole, MD;  [facility];  Service: Endoscopy;  Laterality: N/A;  . GASTRECTOMY     partial  . MEMBRANE PEEL  03/13/2012   Procedure: MEMBRANE PEEL;  Surgeon: Adonis Brook, MD;  Location: [place];  Service: Ophthalmology;  Laterality: Left;  . PARS PLANA VITRECTOMY  03/13/2012   Procedure: PARS PLANA VITRECTOMY WITH 23 GAUGE;  Surgeon: Adonis Brook, MD;  Location: [place];  Service: Ophthalmology;  Laterality: Left;   Social History   Tobacco Use  . Smoking status: Never Smoker  . Smokeless tobacco: Never Used  Substance Use Topics  . Alcohol use: No    Alcohol/week: 0.0 standard drinks  . Drug use: No   Family History  Problem Relation Age of Onset  . Cancer Sister        unknown type, pt feels alcohol related  . Colon cancer Brother   . Throat cancer Brother        pt feels alcohol related  . Colon cancer Brother        1/2 brother  . Hypertension Mother   . Stomach cancer Neg Hx   . Pancreatic cancer Neg Hx    No Known Allergies Current Outpatient Medications on File Prior to Visit  Medication Sig Dispense Refill  . aspirin EC 81 MG tablet Take 1 tablet (81 mg total) by mouth daily. 90 tablet 3  . carvedilol (COREG) 3.125 MG tablet TAKE 1 TABLET (3.125 MG TOTAL) BY MOUTH 2 (TWO) TIMES DAILY WITH A MEAL. 180 tablet 0  . dorzolamide-timolol (COSOPT) 22.3-6.8 MG/ML ophthalmic solution Place 1 drop into the left eye daily. Use as directed     . ferrous sulfate 325 (65 FE) MG tablet Take 1 tablet (325 mg total) by mouth 2 (two) times daily  with a meal. 30 tablet 0  . valsartan-hydrochlorothiazide (DIOVAN-HCT) 160-25 MG tablet TAKE 1 TABLET BY MOUTH EVERY DAY 90 tablet 1   No current facility-administered medications on file prior to visit.     Review of Systems  Constitutional: Negative for activity change, appetite change, fatigue, fever and unexpected weight change.  HENT: Positive for postnasal drip and rhinorrhea. Negative for congestion, sore throat and trouble swallowing.  Eyes: Negative for pain, redness, itching and visual disturbance.  Respiratory: Negative for cough, chest tightness, shortness of breath and wheezing.   Cardiovascular: Negative for chest pain and palpitations.  Gastrointestinal: Negative for abdominal pain, blood in stool, constipation, diarrhea and nausea.  Endocrine: Negative for cold intolerance, heat intolerance, polydipsia and polyuria.  Genitourinary: Negative for difficulty urinating, dysuria, frequency and urgency.  Musculoskeletal: Negative for arthralgias, joint swelling and myalgias.  Skin: Negative for pallor and rash.  Neurological: Negative for dizziness, tremors, weakness, numbness and headaches.  Hematological: Negative for adenopathy. Does not bruise/bleed easily.  Psychiatric/Behavioral: Negative for decreased concentration and dysphoric mood. The patient is not nervous/anxious.        Objective:   Physical Exam Constitutional:      General: He is not in acute distress.    Appearance: He is well-developed and normal weight. He is not ill-appearing.  HENT:     Head: Normocephalic and atraumatic.     Right Ear: Tympanic membrane, ear canal and external ear normal.     Left Ear: Tympanic membrane, ear canal and external ear normal.     Nose: Rhinorrhea present.     Mouth/Throat:     Mouth: Mucous membranes are moist.     Pharynx: Oropharynx is clear.  Eyes:     General: No scleral icterus.       Right eye: No discharge.        Left eye: No discharge.      Conjunctiva/sclera: Conjunctivae normal.     Pupils: Pupils are equal, round, and reactive to light.  Neck:     Musculoskeletal: Normal range of motion and neck supple.     Thyroid: No thyromegaly.     Vascular: No carotid bruit or JVD.  Cardiovascular:     Rate and Rhythm: Normal rate and regular rhythm.     Pulses: Normal pulses.     Heart sounds: Normal heart sounds. No murmur. No gallop.   Pulmonary:     Effort: Pulmonary effort is normal. No respiratory distress.     Breath sounds: Normal breath sounds. No stridor. No wheezing or rhonchi.  Chest:     Chest wall: No tenderness.  Abdominal:     General: Bowel sounds are normal. There is no distension or abdominal bruit.     Palpations: Abdomen is soft. There is no mass.     Tenderness: There is no abdominal tenderness. There is no guarding or rebound.  Musculoskeletal:        General: No tenderness or deformity.     Right lower leg: No edema.     Left lower leg: No edema.  Lymphadenopathy:     Cervical: No cervical adenopathy.  Skin:    General: Skin is warm and dry.     Capillary Refill: Capillary refill takes less than 2 seconds.     Coloration: Skin is not pale.     Findings: No erythema or rash.  Neurological:     General: No focal deficit present.     Mental Status: He is alert.     Cranial Nerves: No cranial nerve deficit.     Motor: No abnormal muscle tone.     Coordination: Coordination normal.     Deep Tendon Reflexes: Reflexes are normal and symmetric. Reflexes normal.  Psychiatric:        Mood and Affect: Mood normal.           Assessment & Plan:   Problem List Items Addressed This  Visit      Cardiovascular and Mediastinum   Essential hypertension    bp in fair control at this time  BP Readings from Last 1 Encounters:  05/21/18 130/80   No changes needed Most recent labs reviewed  Disc lifstyle change with low sodium diet and exercise        Relevant Medications   atorvastatin (LIPITOR) 10  MG tablet     Digestive   GERD (gastroesophageal reflux disease)    Doing very well with protonix       Relevant Medications   pantoprazole (PROTONIX) 40 MG tablet     Genitourinary   BPH (benign prostatic hyperplasia)    Lab Results  Component Value Date   PSA 3.19 05/08/2018   PSA 2.71 03/26/2017   PSA 1.61 01/26/2016   He sees urology yearly  His psa was lower in their office this summer      Relevant Medications   tamsulosin (FLOMAX) 0.4 MG CAPS capsule     Other   Hyperlipidemia    Disc goals for lipids and reasons to control them Rev last labs with pt Rev low sat fat diet in detail Fairly good control with diet and lipitor       Relevant Medications   atorvastatin (LIPITOR) 10 MG tablet   Routine general medical examination at a health care facility - Primary    Reviewed health habits including diet and exercise and skin cancer prevention Reviewed appropriate screening tests for age  Also reviewed health mt list, fam hx and immunization status , as well as social and family history   See HPI amw rev  Labs rev  Recommend more exercise  Continue yearly urology appt      Prediabetes    Lab Results  Component Value Date   HGBA1C 5.8 05/08/2018   disc imp of low glycemic diet and wt loss to prevent DM2       Iron deficiency anemia    Nl cbc- seems to be resolved No GI issues      B12 deficiency    Taking orally with good absorbtion Lab Results  Component Value Date   VITAMINB12 >1500 (H) 05/08/2018

## 2018-05-21 NOTE — Patient Instructions (Addendum)
Bundle up and get out and walk more  Keep watching your diet for fats and sugars   Your blood pressure is ok  Cholesterol is stable No change in medicines   See the urologist yearly as planned

## 2018-05-21 NOTE — Assessment & Plan Note (Signed)
Taking orally with good absorbtion Lab Results  Component Value Date   VITAMINB12 >1500 (H) 05/08/2018

## 2018-05-21 NOTE — Assessment & Plan Note (Signed)
Lab Results  Component Value Date   HGBA1C 5.8 05/08/2018   disc imp of low glycemic diet and wt loss to prevent DM2

## 2018-05-21 NOTE — Assessment & Plan Note (Signed)
Disc goals for lipids and reasons to control them Rev last labs with pt Rev low sat fat diet in detail Fairly good control with diet and lipitor

## 2018-06-17 ENCOUNTER — Other Ambulatory Visit: Payer: Self-pay | Admitting: Cardiovascular Disease

## 2018-06-28 ENCOUNTER — Other Ambulatory Visit: Payer: Self-pay | Admitting: Cardiovascular Disease

## 2018-07-10 ENCOUNTER — Other Ambulatory Visit: Payer: Self-pay | Admitting: Cardiovascular Disease

## 2018-08-26 ENCOUNTER — Telehealth: Payer: Self-pay

## 2018-08-26 ENCOUNTER — Ambulatory Visit (INDEPENDENT_AMBULATORY_CARE_PROVIDER_SITE_OTHER): Payer: Medicare Other | Admitting: Family Medicine

## 2018-08-26 ENCOUNTER — Encounter: Payer: Self-pay | Admitting: Family Medicine

## 2018-08-26 DIAGNOSIS — I10 Essential (primary) hypertension: Secondary | ICD-10-CM

## 2018-08-26 DIAGNOSIS — K219 Gastro-esophageal reflux disease without esophagitis: Secondary | ICD-10-CM | POA: Diagnosis not present

## 2018-08-26 DIAGNOSIS — R42 Dizziness and giddiness: Secondary | ICD-10-CM | POA: Insufficient documentation

## 2018-08-26 DIAGNOSIS — K279 Peptic ulcer, site unspecified, unspecified as acute or chronic, without hemorrhage or perforation: Secondary | ICD-10-CM | POA: Diagnosis not present

## 2018-08-26 NOTE — Assessment & Plan Note (Signed)
With hx of peptic ulcer disease with bleeding and surgery in the past Pt has heartburn/some regurgitation and esophageal discomfort radiating to the back  This following a week of high fat eating (which he stopped this weekend) No nsaids Taking protonix 40 mg daily (inst pt to take bid for 2 weeks or longer if needed)  In light of past GI bleed-watch for black stool or coffee ground emesis   Of note-he still takes iron  We will set up drive through labs for cbc and cmet  Then make plan to eval further

## 2018-08-26 NOTE — Telephone Encounter (Signed)
[place] Night - Client Nonclinical Telephone Record [facility] Night - Client Client Site [place] Physician [name] - MD Contact Type Call Who Is Calling Patient / Member / Family / Caregiver Caller Name [place] Phone Number [phone] Patient Name Jerry Farrell Patient DOB [birth year] Call Type Message Only Information Provided Reason for Call Request to Schedule Office Appointment Initial Comment Caller states that he would like to make an appt. for today. Additional Comment Call Closed By: Nolon Nations Transaction Date/Time: 08/26/2018 8:00:28 AM (ET)

## 2018-08-26 NOTE — Assessment & Plan Note (Signed)
bp is elevated in setting of occ brief light headed ness and pressure in head  We will set up drive through bp assessment  No missed med (carvedilol and valsartan hct)  Is exercising less with quarantine as well  If elevated here will add medicine Also addressing esophageal discomfort that is not exertional (per pt does not feel like heart)

## 2018-08-26 NOTE — Assessment & Plan Note (Signed)
Worse GERD symptoms after eating fatty foods Check cmet and cbc today Continue iron Inc protonix to bid  Update  If worse-will go to ER

## 2018-08-26 NOTE — Progress Notes (Signed)
Virtual Visit via Video Note  I connected with Jerry Farrell on 08/26/18 at 12:00 PM EDT by a video enabled telemedicine application and verified that I am speaking with the correct person using two identifiers.  Location: Patient: home Provider: office    I discussed the limitations of evaluation and management by telemedicine and the availability of in person appointments. The patient expressed understanding and agreed to proceed.  History of Present Illness: Patient presents with elevated BP and dizziness  Also gas- and some esophageal discomfort through to back  GERD is acting up  Also around L shoulder blade  Not spitting up blood  No black stool (still taking iron) No missed protonix doses Had regurgitation   Some improvement with getting back to regular/healthy diet  Did better over the weekend   Some throat clearing - like phlegm in his throat  Cough - a little when he goes to bed- just briefly  A little less energy He still has a gallbladder    Stopped eating fatty foods recently (last week)  Eating chicken wings   No fever   Has had some brief episodes of light headed ness  BP up for at least a week    bp at home was in 180s/90s at last check (a bit lower yesterday)  Was previously well controlled    H/o HTN BP Readings from Last 3 Encounters:  05/21/18 130/80  05/08/18 122/78  05/01/17 126/78   He takes carvedilol 3.125 bid  Valsartan-hctz 160-25 mg one tab daily  No missed doses   Less exercise Stopped bowling  Walks occas  Push mow's lawn  Symptoms are not triggered by exercise   No nsaids   Lab Results  Component Value Date   CREATININE 1.27 05/08/2018   BUN 18 05/08/2018   NA 135 05/08/2018   K 3.7 05/08/2018   CL 99 05/08/2018   CO2 28 05/08/2018   Lab Results  Component Value Date   HGBA1C 5.8 05/08/2018   Lab Results  Component Value Date   CHOL 176 05/08/2018   HDL 47.80 05/08/2018   LDLCALC 67 03/26/2017   LDLDIRECT  80.0 05/08/2018   TRIG 219.0 (H) 05/08/2018   CHOLHDL 4 05/08/2018   Takes statin    Lab Results  Component Value Date   WBC 8.4 05/08/2018   HGB 16.2 05/08/2018   HCT 47.0 05/08/2018   MCV 86.0 05/08/2018   PLT 173.0 05/08/2018     Review of Systems  Constitutional: Negative for chills, fever, malaise/fatigue and weight loss.  HENT: Negative for congestion, ear pain and sinus pain.   Eyes: Negative for discharge and redness.  Respiratory: Negative for cough, hemoptysis, shortness of breath and wheezing.   Cardiovascular: Negative for chest pain, palpitations and leg swelling.       Pt feels chest pain-back pain related to GERD/ "feels like reflux"   Gastrointestinal: Positive for heartburn. Negative for abdominal pain, blood in stool, constipation, diarrhea, melena, nausea and vomiting.  Musculoskeletal: Negative for myalgias.  Skin: Negative for itching and rash.  Neurological: Positive for dizziness. Negative for tingling, sensory change, speech change, focal weakness and headaches.    Patient Active Problem List   Diagnosis Date Noted  . Dizziness 08/26/2018  . Peptic ulcer disease 06/14/2016  . Iron deficiency anemia 06/14/2016  . B12 deficiency 06/14/2016  . Prediabetes 01/31/2016  . Need for hepatitis C screening test 01/25/2016  . Post-nasal drip 12/10/2015  . GERD (gastroesophageal reflux disease) 07/20/2015  .  Routine general medical examination at a health care facility 10/28/2014  . Elevated alkaline phosphatase level 10/28/2014  . BPH (benign prostatic hyperplasia) 04/29/2014  . Prostate cancer screening 08/11/2013  . Encounter for Medicare annual wellness exam 08/11/2013  . Hyperlipidemia 01/09/2007  . ERECTILE DYSFUNCTION 01/09/2007  . Essential hypertension 01/09/2007  . Coronary atherosclerosis 01/09/2007  . ESOPHAGITIS 01/09/2007   Past Medical History:  Diagnosis Date  . BPH (benign prostatic hyperplasia)   . CAD (coronary artery disease)     cardiolite ok 3/05, stress test- low risk study 11/06  . Chronic low back pain    with radiculopathy  . ED (erectile dysfunction)   . Gallstones    abd [place]- stable hamangioma, gallbladder sludge and tiny stones 09/2003  . GERD (gastroesophageal reflux disease)   . Hyperlipidemia   . Hypertension   . Transfusion history    Past Surgical History:  Procedure Laterality Date  . CATARACT EXTRACTION W/PHACO  03/13/2012   Procedure: CATARACT EXTRACTION PHACO AND INTRAOCULAR LENS PLACEMENT (IOC);  Surgeon: Adonis Brook, MD;  Location: [facility];  Service: Ophthalmology;  Laterality: Left;  . CORONARY ARTERY BYPASS GRAFT  1998   x 1  [facility]  . ESOPHAGOGASTRODUODENOSCOPY (EGD) WITH PROPOFOL N/A 06/07/2016   Procedure: ESOPHAGOGASTRODUODENOSCOPY (EGD) WITH PROPOFOL;  Surgeon: Mauri Pole, MD;  Location: [facility];  Service: Endoscopy;  Laterality: N/A;  . GASTRECTOMY     partial  . MEMBRANE PEEL  03/13/2012   Procedure: MEMBRANE PEEL;  Surgeon: Adonis Brook, MD;  Location: [facility];  Service: Ophthalmology;  Laterality: Left;  . PARS PLANA VITRECTOMY  03/13/2012   Procedure: PARS PLANA VITRECTOMY WITH 23 GAUGE;  Surgeon: Adonis Brook, MD;  Location: [place];  Service: Ophthalmology;  Laterality: Left;   Social History   Tobacco Use  . Smoking status: Never Smoker  . Smokeless tobacco: Never Used  Substance Use Topics  . Alcohol use: No    Alcohol/week: 0.0 standard drinks  . Drug use: No   Family History  Problem Relation Age of Onset  . Cancer Sister        unknown type, pt feels alcohol related  . Colon cancer Brother   . Throat cancer Brother        pt feels alcohol related  . Colon cancer Brother        1/2 brother  . Hypertension Mother   . Stomach cancer Neg Hx   . Pancreatic cancer Neg Hx    No Known Allergies Current Outpatient Medications on File Prior to Visit  Medication Sig Dispense Refill  . aspirin EC 81 MG tablet Take 1 tablet (81 mg total) by mouth  daily. 90 tablet 3  . atorvastatin (LIPITOR) 10 MG tablet Take 1 tablet (10 mg total) by mouth daily. 90 tablet 3  . carvedilol (COREG) 3.125 MG tablet Take 1 tablet (3.125 mg total) by mouth 2 (two) times daily with a meal. 180 tablet 0  . dorzolamide-timolol (COSOPT) 22.3-6.8 MG/ML ophthalmic solution Place 1 drop into the left eye daily. Use as directed     . ferrous sulfate 325 (65 FE) MG tablet Take 1 tablet (325 mg total) by mouth 2 (two) times daily with a meal. 30 tablet 0  . pantoprazole (PROTONIX) 40 MG tablet Take 1 tablet (40 mg total) by mouth daily. 90 tablet 3  . tamsulosin (FLOMAX) 0.4 MG CAPS capsule Take 1 capsules by mouth once daily 90 capsule 3  . valsartan-hydrochlorothiazide (DIOVAN-HCT) 160-25  MG tablet TAKE 1 TABLET BY MOUTH EVERY DAY 90 tablet 0   No current facility-administered medications on file prior to visit.     Observations/Objective: Pt looks well/like his normal self  Not distressed or anxious /mentally sharp and with normal affect No facial swelling or change from baseline Nl speech /not slurred or hoarse  occ clears throat  Not sob or wheezing with interview today  No skin tone-no pallor/jaundice or rash  Talkative and pleasant  He ambulates on camera w/o problem and is not dizzy during interview   Assessment and Plan: Problem List Items Addressed This Visit      Cardiovascular and Mediastinum   Essential hypertension - Primary    bp is elevated in setting of occ brief light headed ness and pressure in head  We will set up drive through bp assessment  No missed med (carvedilol and valsartan hct)  Is exercising less with quarantine as well  If elevated here will add medicine Also addressing esophageal discomfort that is not exertional (per pt does not feel like heart)        Relevant Orders   Comprehensive metabolic panel   CBC with Differential/Platelet     Digestive   GERD (gastroesophageal reflux disease)    With hx of peptic ulcer  disease with bleeding and surgery in the past Pt has heartburn/some regurgitation and esophageal discomfort radiating to the back  This following a week of high fat eating (which he stopped this weekend) No nsaids Taking protonix 40 mg daily (inst pt to take bid for 2 weeks or longer if needed)  In light of past GI bleed-watch for black stool or coffee ground emesis   Of note-he still takes iron  We will set up drive through labs for cbc and cmet  Then make plan to eval further      Relevant Orders   Comprehensive metabolic panel   CBC with Differential/Platelet   Lipase   Peptic ulcer disease    Worse GERD symptoms after eating fatty foods Check cmet and cbc today Continue iron Inc protonix to bid  Update  If worse-will go to ER      Relevant Orders   Comprehensive metabolic panel   CBC with Differential/Platelet   Lipase     Other   Dizziness    Brief episodes in setting of worse gerd symptoms and also elevated bp He describes it as light headedness - brief/seconds at a time  In light of h/o peptic ulcer-labs ordered  Also drive through bp ordered If worse will go to ER and alert [place]           Follow Up Instructions: Please take your protonix twice daily (am and pm) for 2 weeks  We can extend that if needed  Watch diet-stay away from the wings/fatty foods you were eating   The office will call you to set up drive through labs and blood pressure check  We will add more blood pressure medicine if needed  If symptoms suddenly worsen / or you develop chest pain please go to the ER and let us know   We will work on a plan when numbers return    I discussed the assessment and treatment plan with the patient. The patient was provided an opportunity to ask questions and all were answered. The patient agreed with the plan and demonstrated an understanding of the instructions.   The patient was advised to call back or seek an in-person evaluation  if the symptoms worsen  or if the condition fails to improve as anticipated.    [name], MD

## 2018-08-26 NOTE — Assessment & Plan Note (Signed)
Brief episodes in setting of worse gerd symptoms and also elevated bp He describes it as light headedness - brief/seconds at a time  In light of h/o peptic ulcer-labs ordered  Also drive through bp ordered If worse will go to ER and alert [place]

## 2018-08-26 NOTE — Telephone Encounter (Signed)
Patient seen by PCP via virtual visit today to address concerns.

## 2018-08-26 NOTE — Patient Instructions (Signed)
Please take your protonix twice daily (am and pm) for 2 weeks  We can extend that if needed  Watch diet-stay away from the wings/fatty foods you were eating   The office will call you to set up drive through labs and blood pressure check  We will add more blood pressure medicine if needed  If symptoms suddenly worsen / or you develop chest pain please go to the ER and let us know   We will work on a plan when numbers return

## 2018-08-28 ENCOUNTER — Other Ambulatory Visit (INDEPENDENT_AMBULATORY_CARE_PROVIDER_SITE_OTHER): Payer: Medicare Other

## 2018-08-28 ENCOUNTER — Encounter: Payer: Self-pay | Admitting: *Deleted

## 2018-08-28 ENCOUNTER — Ambulatory Visit: Payer: Medicare Other | Admitting: *Deleted

## 2018-08-28 VITALS — BP 144/82 | HR 65

## 2018-08-28 DIAGNOSIS — I10 Essential (primary) hypertension: Secondary | ICD-10-CM

## 2018-08-28 DIAGNOSIS — K279 Peptic ulcer, site unspecified, unspecified as acute or chronic, without hemorrhage or perforation: Secondary | ICD-10-CM | POA: Diagnosis not present

## 2018-08-28 DIAGNOSIS — K219 Gastro-esophageal reflux disease without esophagitis: Secondary | ICD-10-CM | POA: Diagnosis not present

## 2018-08-28 LAB — CBC WITH DIFFERENTIAL/PLATELET
Basophils Absolute: 0 10*3/uL (ref 0.0–0.1)
Basophils Relative: 0.3 % (ref 0.0–3.0)
Eosinophils Absolute: 0 10*3/uL (ref 0.0–0.7)
Eosinophils Relative: 0.6 % (ref 0.0–5.0)
HCT: 44.8 % (ref 39.0–52.0)
Hemoglobin: 15.7 g/dL (ref 13.0–17.0)
Lymphocytes Relative: 13.7 % (ref 12.0–46.0)
Lymphs Abs: 1 10*3/uL (ref 0.7–4.0)
MCHC: 35.1 g/dL (ref 30.0–36.0)
MCV: 84.1 fl (ref 78.0–100.0)
Monocytes Absolute: 0.7 10*3/uL (ref 0.1–1.0)
Monocytes Relative: 9.4 % (ref 3.0–12.0)
Neutro Abs: 5.7 10*3/uL (ref 1.4–7.7)
Neutrophils Relative %: 76 % (ref 43.0–77.0)
Platelets: 153 10*3/uL (ref 150.0–400.0)
RBC: 5.32 Mil/uL (ref 4.22–5.81)
RDW: 13.3 % (ref 11.5–15.5)
WBC: 7.5 10*3/uL (ref 4.0–10.5)

## 2018-08-28 LAB — COMPREHENSIVE METABOLIC PANEL
ALT: 21 U/L (ref 0–53)
AST: 19 U/L (ref 0–37)
Albumin: 4.3 g/dL (ref 3.5–5.2)
Alkaline Phosphatase: 98 U/L (ref 39–117)
BUN: 13 mg/dL (ref 6–23)
CO2: 30 mEq/L (ref 19–32)
Calcium: 8.9 mg/dL (ref 8.4–10.5)
Chloride: 92 mEq/L — ABNORMAL LOW (ref 96–112)
Creatinine, Ser: 1.03 mg/dL (ref 0.40–1.50)
GFR: 85.15 mL/min (ref >60.00)
Glucose, Bld: 119 mg/dL — ABNORMAL HIGH (ref 70–99)
Potassium: 3.8 mEq/L (ref 3.5–5.1)
Sodium: 129 mEq/L — ABNORMAL LOW (ref 135–145)
Total Bilirubin: 2 mg/dL — ABNORMAL HIGH (ref 0.2–1.2)
Total Protein: 7.1 g/dL (ref 6.0–8.3)

## 2018-08-28 LAB — LIPASE: Lipase: 37 U/L (ref 11.0–59.0)

## 2018-08-28 NOTE — Progress Notes (Signed)
Per Dr Marliss Coots encounter order on 08/26/2018, patient presents today for a nurse visit blood pressure check for ongoing follow up and management.  Vital Sign Readings today BP 144/82, P 65.

## 2018-08-29 ENCOUNTER — Telehealth: Payer: Self-pay | Admitting: *Deleted

## 2018-08-29 NOTE — Telephone Encounter (Signed)
Addressed in result notes  

## 2018-08-29 NOTE — Telephone Encounter (Signed)
Called pt regarding lab results and no answer and no VM

## 2018-08-30 ENCOUNTER — Other Ambulatory Visit: Payer: Self-pay

## 2018-08-30 ENCOUNTER — Encounter: Payer: Self-pay | Admitting: Family Medicine

## 2018-08-30 ENCOUNTER — Ambulatory Visit (INDEPENDENT_AMBULATORY_CARE_PROVIDER_SITE_OTHER): Payer: Medicare Other | Admitting: Family Medicine

## 2018-08-30 VITALS — BP 138/78 | HR 63 | Temp 98.4°F | Ht 69.0 in | Wt 179.4 lb

## 2018-08-30 DIAGNOSIS — K219 Gastro-esophageal reflux disease without esophagitis: Secondary | ICD-10-CM | POA: Diagnosis not present

## 2018-08-30 DIAGNOSIS — R0789 Other chest pain: Secondary | ICD-10-CM | POA: Diagnosis not present

## 2018-08-30 DIAGNOSIS — D509 Iron deficiency anemia, unspecified: Secondary | ICD-10-CM

## 2018-08-30 DIAGNOSIS — R5383 Other fatigue: Secondary | ICD-10-CM | POA: Insufficient documentation

## 2018-08-30 DIAGNOSIS — K5900 Constipation, unspecified: Secondary | ICD-10-CM | POA: Insufficient documentation

## 2018-08-30 DIAGNOSIS — R5382 Chronic fatigue, unspecified: Secondary | ICD-10-CM | POA: Diagnosis not present

## 2018-08-30 DIAGNOSIS — E871 Hypo-osmolality and hyponatremia: Secondary | ICD-10-CM | POA: Diagnosis not present

## 2018-08-30 DIAGNOSIS — I10 Essential (primary) hypertension: Secondary | ICD-10-CM

## 2018-08-30 DIAGNOSIS — I2510 Atherosclerotic heart disease of native coronary artery without angina pectoris: Secondary | ICD-10-CM

## 2018-08-30 DIAGNOSIS — R42 Dizziness and giddiness: Secondary | ICD-10-CM

## 2018-08-30 MED ORDER — VALSARTAN 160 MG PO TABS: 160.0000 mg | ORAL_TABLET | Freq: Every day | ORAL | 0 refills | Status: DC

## 2018-08-30 NOTE — Assessment & Plan Note (Signed)
Na of 129 last draw Will hold hctz Re check in 1-2 wk  Has also drank more water lately

## 2018-08-30 NOTE — Assessment & Plan Note (Signed)
Generalized fatigue (did stop coffee and energy drinks)  inst to gradually increase exercise to get stamina back /watch for angina or any problems and report back if not imp

## 2018-08-30 NOTE — Assessment & Plan Note (Signed)
Normal cbc Still on iron -no problems

## 2018-08-30 NOTE — Progress Notes (Signed)
Subjective:    Patient ID: Jerry Farrell, male    DOB: Sep 21, 1945, 73 y.o.   MRN: [id number]  HPI  Here for ongoing throat/chest/reflux symptoms  Seen virtually on 08/26/18 At that time he described a gassy feeling with chest/esophageal discomfort through to his back Also L shoulder blade  Some regurgitation of acid and throat clearing  Had been eating a lot of fatty foods   Has a gallbladder  Last checked- ? Sludge but f/u US in 07 did not see it   He also had concerns bp was up at that time and occ felt dizzy   Pt has hx of bleeding gastric ulcer-still takes iron for this   Wt Readings from Last 3 Encounters:  08/30/18 179 lb 7 oz (81.4 kg)  05/08/18 185 lb 12 oz (84.3 kg)  05/01/17 183 lb 12 oz (83.3 kg)   26.50 kg/m   BP Readings from Last 3 Encounters:  08/30/18 138/78  08/28/18 (!) 144/82  05/21/18 130/80   He avoids nsaids We inc protonix to bid   Throat clearing is slightly better Throat bothers him on and off - feels like a glob /no pain  No longer regurgitates acid  Heartburn /gas feeling has improved much since increasing protonix  No stomach pain   Lab Results  Component Value Date   WBC 7.5 08/28/2018   HGB 15.7 08/28/2018   HCT 44.8 08/28/2018   MCV 84.1 08/28/2018   PLT 153.0 08/28/2018   Continues iron   Not dizzy- a "funny feeling" once in a while   Less energy lately  No palpitations or fast heartbeat   Used to walk 3 miles every am (or more) in 45 minutes  He stopped that in December due to the cold  Now he is walking around house outdoors 3 times - 5 minutes  Push mows outdoors once per week -1 hour  He thinks he can go back to usual walking No exertional cp or sob  He has tightness in the beginning and then loosens up    H/o CAD s/p cabg in 1994 Pts EKG shows nonspecific T wave abn today rate  65 He has had ST depression during stress test in 2017 with nl perfusion study  Lab Results  Component Value Date   CREATININE 1.03  08/28/2018   BUN 13 08/28/2018   NA 129 (L) 08/28/2018   K 3.8 08/28/2018   CL 92 (L) 08/28/2018   CO2 30 08/28/2018  on diovan hct   Low na  He has been drinking more water over the past 2 weeks About three 20 oz bottles per day  Prior to that he was drinking an energy drink with caffeine in it    Was drinking 2-3 per day for a while  He had stopped coffee due to acid reflux   Patient Active Problem List   Diagnosis Date Noted  . Constipation 08/30/2018  . Fatigue 08/30/2018  . Hyponatremia 08/30/2018  . Dizziness 08/26/2018  . Peptic ulcer disease 06/14/2016  . Iron deficiency anemia 06/14/2016  . B12 deficiency 06/14/2016  . Prediabetes 01/31/2016  . Need for hepatitis C screening test 01/25/2016  . Post-nasal drip 12/10/2015  . GERD (gastroesophageal reflux disease) 07/20/2015  . Routine general medical examination at a health care facility 10/28/2014  . Elevated alkaline phosphatase level 10/28/2014  . BPH (benign prostatic hyperplasia) 04/29/2014  . Prostate cancer screening 08/11/2013  . Encounter for Medicare annual wellness exam 08/11/2013  .  Hyperlipidemia 01/09/2007  . ERECTILE DYSFUNCTION 01/09/2007  . Essential hypertension 01/09/2007  . Coronary atherosclerosis 01/09/2007  . ESOPHAGITIS 01/09/2007   Past Medical History:  Diagnosis Date  . BPH (benign prostatic hyperplasia)   . CAD (coronary artery disease)    cardiolite ok 3/05, stress test- low risk study 11/06  . Chronic low back pain    with radiculopathy  . ED (erectile dysfunction)   . Gallstones    abd [place]- stable hamangioma, gallbladder sludge and tiny stones 09/2003  . GERD (gastroesophageal reflux disease)   . Hyperlipidemia   . Hypertension   . Transfusion history    Past Surgical History:  Procedure Laterality Date  . CATARACT EXTRACTION W/PHACO  03/13/2012   Procedure: CATARACT EXTRACTION PHACO AND INTRAOCULAR LENS PLACEMENT (IOC);  Surgeon: Adonis Brook, MD;  Location: [facility];  Service:  Ophthalmology;  Laterality: Left;  . CORONARY ARTERY BYPASS GRAFT  1998   x 1  [facility]  . ESOPHAGOGASTRODUODENOSCOPY (EGD) WITH PROPOFOL N/A 06/07/2016   Procedure: ESOPHAGOGASTRODUODENOSCOPY (EGD) WITH PROPOFOL;  Surgeon: Mauri Pole, MD;  Location: [facility];  Service: Endoscopy;  Laterality: N/A;  . GASTRECTOMY     partial  . MEMBRANE PEEL  03/13/2012   Procedure: MEMBRANE PEEL;  Surgeon: Adonis Brook, MD;  Location: [facility];  Service: Ophthalmology;  Laterality: Left;  . PARS PLANA VITRECTOMY  03/13/2012   Procedure: PARS PLANA VITRECTOMY WITH 23 GAUGE;  Surgeon: Adonis Brook, MD;  Location: [facility];  Service: Ophthalmology;  Laterality: Left;   Social History   Tobacco Use  . Smoking status: Never Smoker  . Smokeless tobacco: Never Used  Substance Use Topics  . Alcohol use: No    Alcohol/week: 0.0 standard drinks  . Drug use: No   Family History  Problem Relation Age of Onset  . Cancer Sister        unknown type, pt feels alcohol related  . Colon cancer Brother   . Throat cancer Brother        pt feels alcohol related  . Colon cancer Brother        1/2 brother  . Hypertension Mother   . Stomach cancer Neg Hx   . Pancreatic cancer Neg Hx    No Known Allergies Current Outpatient Medications on File Prior to Visit  Medication Sig Dispense Refill  . aspirin EC 81 MG tablet Take 1 tablet (81 mg total) by mouth daily. 90 tablet 3  . atorvastatin (LIPITOR) 10 MG tablet Take 1 tablet (10 mg total) by mouth daily. 90 tablet 3  . carvedilol (COREG) 3.125 MG tablet Take 1 tablet (3.125 mg total) by mouth 2 (two) times daily with a meal. 180 tablet 0  . dorzolamide-timolol (COSOPT) 22.3-6.8 MG/ML ophthalmic solution Place 1 drop into the left eye daily. Use as directed     . ferrous sulfate 325 (65 FE) MG tablet Take 1 tablet (325 mg total) by mouth 2 (two) times daily with a meal. 30 tablet 0  . pantoprazole (PROTONIX) 40 MG tablet Take 1 tablet (40 mg total) by  mouth daily. 90 tablet 3  . tamsulosin (FLOMAX) 0.4 MG CAPS capsule Take 1 capsules by mouth once daily 90 capsule 3   No current facility-administered medications on file prior to visit.     Review of Systems  Constitutional: Positive for activity change and fatigue. Negative for appetite change, chills, diaphoresis, fever and unexpected weight change.  HENT: Negative for congestion, rhinorrhea, sore throat, trouble  swallowing and voice change.        Globus sens in throat -improved  Eyes: Negative for pain, redness, itching and visual disturbance.  Respiratory: Negative for apnea, cough, choking, chest tightness, shortness of breath, wheezing and stridor.   Cardiovascular: Negative for chest pain, palpitations and leg swelling.  Gastrointestinal: Positive for constipation. Negative for abdominal distention, abdominal pain, anal bleeding, blood in stool, diarrhea, nausea, rectal pain and vomiting.       Improved heartburn  No longer has chest pain  No longer has gas pains in abd or esophagus area    Endocrine: Negative for cold intolerance, heat intolerance, polydipsia and polyuria.  Genitourinary: Negative for difficulty urinating, dysuria, frequency and urgency.  Musculoskeletal: Negative for arthralgias, back pain, joint swelling and myalgias.  Skin: Negative for pallor and rash.  Neurological: Negative for dizziness, tremors, weakness, light-headedness, numbness and headaches.  Hematological: Negative for adenopathy. Does not bruise/bleed easily.  Psychiatric/Behavioral: Negative for confusion, decreased concentration and dysphoric mood. The patient is not nervous/anxious.        Objective:   Physical Exam Constitutional:      General: He is not in acute distress.    Appearance: Normal appearance. He is well-developed and normal weight. He is not ill-appearing or diaphoretic.  HENT:     Head: Normocephalic and atraumatic.     Nose: Nose normal.     Mouth/Throat:     Mouth:  Mucous membranes are moist.     Pharynx: Oropharynx is clear. No oropharyngeal exudate or posterior oropharyngeal erythema.  Eyes:     General: No scleral icterus.    Conjunctiva/sclera: Conjunctivae normal.     Pupils: Pupils are equal, round, and reactive to light.  Neck:     Musculoskeletal: Normal range of motion and neck supple.     Thyroid: No thyromegaly.     Vascular: No carotid bruit or JVD.  Cardiovascular:     Rate and Rhythm: Normal rate and regular rhythm.     Pulses: Normal pulses.     Heart sounds: Normal heart sounds. No murmur. No gallop.   Pulmonary:     Effort: Pulmonary effort is normal. No respiratory distress.     Breath sounds: Normal breath sounds. No stridor. No wheezing or rales.     Comments: No chest wall tenderness Abdominal:     General: Bowel sounds are normal. There is no distension or abdominal bruit.     Palpations: Abdomen is soft. There is no mass.     Tenderness: There is no abdominal tenderness. There is no guarding or rebound.     Comments: Baseline vertical abd scar  Musculoskeletal:     Right lower leg: No edema.     Left lower leg: No edema.  Lymphadenopathy:     Cervical: No cervical adenopathy.  Skin:    General: Skin is warm and dry.     Coloration: Skin is not pale.     Findings: No erythema or rash.  Neurological:     Mental Status: He is alert. Mental status is at baseline.     Cranial Nerves: No cranial nerve deficit.     Motor: No weakness.     Coordination: Coordination normal.     Deep Tendon Reflexes: Reflexes are normal and symmetric. Reflexes normal.  Psychiatric:        Mood and Affect: Mood normal.           Assessment & Plan:   Problem List Items Addressed This Visit  Cardiovascular and Mediastinum   Essential hypertension    bp in fair control at this time  BP Readings from Last 1 Encounters:  08/30/18 138/78   This is well controlled - but we need to d/c the hctz due to hyponatremia and bp will  likely go up  Will hold hctz (take valsartan 160 and continue carvedilol) and bp 1-2 wk for lab and bp check (he can also watch at home)  Most recent labs reviewed  Disc lifstyle change with low sodium diet and exercise         Relevant Medications   valsartan (DIOVAN) 160 MG tablet   Coronary atherosclerosis    Due for f/u with Dr Gwenlyn Found for yearly visit  No angina  EKG done in light of chest discomfort with GERD Nonspecific T wave changes noted       Relevant Medications   valsartan (DIOVAN) 160 MG tablet     Digestive   GERD (gastroesophageal reflux disease) - Primary    Last visit-pt had gas pain/chest/abd and back as well as regurgitation and globus sens in throat (after eating fattier diet) Now much improved after inc protonix to bid  Cbc stable and no s/s of peptic ulcer Will continue to follow         Other   Iron deficiency anemia    Normal cbc Still on iron -no problems       Dizziness    improved      Constipation    A bit worse after backing off fatty foods Enc fruit/veg Also miralax -more than once daily is ok  Also we will hold hctz for low na -this may also help Update if not starting to improve in a week or if worsening        Fatigue    Generalized fatigue (did stop coffee and energy drinks)  inst to gradually increase exercise to get stamina back /watch for angina or any problems and report back if not imp       Hyponatremia    Na of 129 last draw Will hold hctz Re check in 1-2 wk  Has also drank more water lately       Other Visit Diagnoses    Chest discomfort       Relevant Orders   EKG 12-Lead (Completed)

## 2018-08-30 NOTE — Assessment & Plan Note (Signed)
Last visit-pt had gas pain/chest/abd and back as well as regurgitation and globus sens in throat (after eating fattier diet) Now much improved after inc protonix to bid  Cbc stable and no s/s of peptic ulcer Will continue to follow

## 2018-08-30 NOTE — Assessment & Plan Note (Signed)
A bit worse after backing off fatty foods Enc fruit/veg Also miralax -more than once daily is ok  Also we will hold hctz for low na -this may also help Update if not starting to improve in a week or if worsening

## 2018-08-30 NOTE — Assessment & Plan Note (Signed)
bp in fair control at this time  BP Readings from Last 1 Encounters:  08/30/18 138/78   This is well controlled - but we need to d/c the hctz due to hyponatremia and bp will likely go up  Will hold hctz (take valsartan 160 and continue carvedilol) and bp 1-2 wk for lab and bp check (he can also watch at home)  Most recent labs reviewed  Disc lifstyle change with low sodium diet and exercise

## 2018-08-30 NOTE — Assessment & Plan Note (Signed)
improved

## 2018-08-30 NOTE — Patient Instructions (Addendum)
Stop valsartan hctz  Start valsartan (without hctz)- I sent to the pharmacy  I think the hctz part of the medication made your sodium level too low   Eat healthy! Fruits and veggies Gradually get back to your previous exercise program- it will take a while to get endurance back  Watch for chest pain or shortness of breath   Continue the protonix twice daily - I think this is helping reflux symptoms and the glob feeling in your throat  If no further improvement then let us know   We will set up a drive through lab visit and BP check in 1-2 weeks

## 2018-08-30 NOTE — Assessment & Plan Note (Signed)
Due for f/u with Dr Gwenlyn Found for yearly visit  No angina  EKG done in light of chest discomfort with GERD Nonspecific T wave changes noted

## 2018-09-04 ENCOUNTER — Ambulatory Visit: Payer: Medicare Other

## 2018-09-04 ENCOUNTER — Telehealth: Payer: Self-pay | Admitting: Cardiovascular Disease

## 2018-09-05 ENCOUNTER — Telehealth (INDEPENDENT_AMBULATORY_CARE_PROVIDER_SITE_OTHER): Payer: Medicare Other | Admitting: Cardiovascular Disease

## 2018-09-05 ENCOUNTER — Encounter: Payer: Self-pay | Admitting: Cardiovascular Disease

## 2018-09-05 DIAGNOSIS — I10 Essential (primary) hypertension: Secondary | ICD-10-CM | POA: Diagnosis not present

## 2018-09-05 DIAGNOSIS — E782 Mixed hyperlipidemia: Secondary | ICD-10-CM

## 2018-09-05 DIAGNOSIS — I25119 Atherosclerotic heart disease of native coronary artery with unspecified angina pectoris: Secondary | ICD-10-CM | POA: Diagnosis not present

## 2018-09-05 NOTE — Patient Instructions (Addendum)
Medication Instructions:   Your physician recommends that you continue on your current medications as directed. Please refer to the Current Medication list given to you today.  If you need a refill on your cardiac medications before your next appointment, please call your pharmacy.   Lab work:  NONE  Testing/Procedures:  NONE  Follow-Up:  Your physician recommends that you schedule a follow-up appointment in: 1 year with Dr. Gwenlyn Found. You will receive a reminder letter in the mail in about 10 months reminding you to call and schedule your appointment. If you don't receive this letter, please contact our office. At [facility], you and your health needs are our priority.  As part of our continuing mission to provide you with exceptional heart care, we have created designated Provider Care Teams.  These Care Teams include your primary Cardiologist (physician) and Advanced Practice Providers (APPs -  Physician Assistants and Nurse Practitioners) who all work together to provide you with the care you need, when you need it. . Your physician recommends that you schedule a follow-up appointment in: 3 months with an extender. You will receive a reminder letter in the mail in about 1-2 months reminding you to call and schedule your appointment. If you don't receive this letter, please contact our office.  Any Other Special Instructions Will Be Listed Below (If Applicable).

## 2018-09-05 NOTE — Progress Notes (Signed)
Virtual Visit via Video Note   This visit type was conducted due to national recommendations for restrictions regarding the COVID-19 Pandemic (e.g. social distancing) in an effort to limit this patient's exposure and mitigate transmission in our community.  Due to his co-morbid illnesses, this patient is at least at moderate risk for complications without adequate follow up.  This format is felt to be most appropriate for this patient at this time.  All issues noted in this document were discussed and addressed.  A limited physical exam was performed with this format.  Please refer to the patient's chart for his consent to telehealth for [facility].   Date:  09/05/2018   ID:  Jerry Farrell, DOB 1945/08/11, MRN [id number]  Patient Location: Home Provider Location: Home  PCP:  Abner Greenspan, MD  Cardiologist:  Quay Burow, MD  Electrophysiologist:  None   Evaluation Performed:  Follow-Up Visit  Chief Complaint: Coronary artery disease/atypical chest pain  History of Present Illness:    Jerry Farrell is a 73 y.o.  thin-appearing married African-American male father of 83, grandfather and 5 grandchildren who is referred back to me by Dr. Glori Bickers, his PCP, to be reestablished in my practice. I last saw him in the office 04/11/2017. His risk factors include treated hypertension and hyperlipidemia. There is is a family history of a brother who recently had bypass surgery. He has never smoked. Retired from [employer]. He had coronary artery bypass graftingX 1in 1994 for what sounds like left main disease. He denies chest pain or shortness of breath. He did see how main in the office 06/02/16 after having had a Myoview stress test 04/11/16 that was essentially normal with no evidence of ischemia.  Since I saw him December 2018 he is done well.  He was complaining of some atypical chest pain earlier this month and saw Dr. Dorothea Ogle.  He was eating greasy food and the thinking was that this was  reflux.  He is changed his diet and his proton pump inhibitor was doubled.  His symptoms have markedly improved.  He did have new inferolateral T wave inversion however on his EKG 08/30/2018.  The patient does not have symptoms concerning for COVID-19 infection (fever, chills, cough, or new shortness of breath).    Past Medical History:  Diagnosis Date  . BPH (benign prostatic hyperplasia)   . CAD (coronary artery disease)    cardiolite ok 3/05, stress test- low risk study 11/06  . Chronic low back pain    with radiculopathy  . ED (erectile dysfunction)   . Gallstones    abd [place]- stable hamangioma, gallbladder sludge and tiny stones 09/2003  . GERD (gastroesophageal reflux disease)   . Hyperlipidemia   . Hypertension   . Transfusion history    Past Surgical History:  Procedure Laterality Date  . CATARACT EXTRACTION W/PHACO  03/13/2012   Procedure: CATARACT EXTRACTION PHACO AND INTRAOCULAR LENS PLACEMENT (IOC);  Surgeon: Adonis Brook, MD;  Location: [place];  Service: Ophthalmology;  Laterality: Left;  . CORONARY ARTERY BYPASS GRAFT  1998   x 1  [facility]  . ESOPHAGOGASTRODUODENOSCOPY (EGD) WITH PROPOFOL N/A 06/07/2016   Procedure: ESOPHAGOGASTRODUODENOSCOPY (EGD) WITH PROPOFOL;  Surgeon: Mauri Pole, MD;  [facility];  Service: Endoscopy;  Laterality: N/A;  . GASTRECTOMY     partial  . MEMBRANE PEEL  03/13/2012   Procedure: MEMBRANE PEEL;  Surgeon: Adonis Brook, MD;  Location: [place];  Service: Ophthalmology;  Laterality:  Left;  . PARS PLANA VITRECTOMY  03/13/2012   Procedure: PARS PLANA VITRECTOMY WITH 23 GAUGE;  Surgeon: Adonis Brook, MD;  Location: [place];  Service: Ophthalmology;  Laterality: Left;     No outpatient medications have been marked as taking for the 09/05/18 encounter (Appointment) with Lorretta Harp, MD.     Allergies:   Patient has no known allergies.   Social History   Tobacco Use  . Smoking status: Never Smoker  . Smokeless  tobacco: Never Used  Substance Use Topics  . Alcohol use: No    Alcohol/week: 0.0 standard drinks  . Drug use: No     Family Hx: The patient's family history includes Cancer in his sister; Colon cancer in his brother and brother; Hypertension in his mother; Throat cancer in his brother. There is no history of Stomach cancer or Pancreatic cancer.  ROS:   Please see the history of present illness.     All other systems reviewed and are negative.   Prior CV studies:   The following studies were reviewed today:  None  Labs/Other Tests and Data Reviewed:    EKG:  An ECG dated 08/30/2018 was personally reviewed today and demonstrated:  Sinus rhythm at 65 with mild inferolateral T wave inversion  Recent Labs: 05/08/2018: TSH 0.40 08/28/2018: ALT 21; BUN 13; Creatinine, Ser 1.03; Hemoglobin 15.7; Platelets 153.0; Potassium 3.8; Sodium 129   Recent Lipid Panel Lab Results  Component Value Date/Time   CHOL 176 05/08/2018 09:20 AM   TRIG 219.0 (H) 05/08/2018 09:20 AM   HDL 47.80 05/08/2018 09:20 AM   CHOLHDL 4 05/08/2018 09:20 AM   LDLCALC 67 03/26/2017 04:40 PM   LDLDIRECT 80.0 05/08/2018 09:20 AM    Wt Readings from Last 3 Encounters:  08/30/18 179 lb 7 oz (81.4 kg)  05/08/18 185 lb 12 oz (84.3 kg)  05/01/17 183 lb 12 oz (83.3 kg)     Objective:    Vital Signs:  There were no vitals taken for this visit.   VITAL SIGNS:  reviewed GEN:  no acute distress RESPIRATORY:  normal respiratory effort, symmetric expansion NEURO:  alert and oriented x 3, no obvious focal deficit PSYCH:  normal affect  ASSESSMENT & PLAN:    1. Coronary artery disease- history of CAD status post CABG x1 in 1994.  His last Myoview performed 04/06/2016 was normal.  He is recently had some atypical chest pain thought to be reflux related which improved with dietary modification and adjustment in his antireflux medications. 2. Essential hypertension- history of essential hypertension with blood pressure  measured by the patient today at 147/71.  He is on carvedilol and valsartan.  He was also on hydrochlorothiazide which was discontinued because of hyponatremia. 3. Hyperlipidemia- history of hyperlipidemia on atorvastatin with lipid profile performed 05/08/2018 revealing a total cholesterol 176, LDL of 80 and HDL 47.  COVID-19 Education: The signs and symptoms of COVID-19 were discussed with the patient and how to seek care for testing (follow up with PCP or arrange E-visit).  The importance of social distancing was discussed today.  Time:   Today, I have spent 8 minutes with the patient with telehealth technology discussing the above problems.     Medication Adjustments/Labs and Tests Ordered: Current medicines are reviewed at length with the patient today.  Concerns regarding medicines are outlined above.   Tests Ordered: No orders of the defined types were placed in this encounter.   Medication Changes: No orders of the defined types  were placed in this encounter.   Disposition:  Follow up in 3 month(s)  Signed, Quay Burow, MD  09/05/2018 12:11 PM    [place]

## 2018-09-12 ENCOUNTER — Ambulatory Visit: Payer: Medicare Other

## 2018-09-12 ENCOUNTER — Other Ambulatory Visit (INDEPENDENT_AMBULATORY_CARE_PROVIDER_SITE_OTHER): Payer: Medicare Other

## 2018-09-12 DIAGNOSIS — E871 Hypo-osmolality and hyponatremia: Secondary | ICD-10-CM | POA: Diagnosis not present

## 2018-09-12 LAB — BASIC METABOLIC PANEL
BUN: 13 mg/dL (ref 6–23)
CO2: 29 mEq/L (ref 19–32)
Calcium: 8.8 mg/dL (ref 8.4–10.5)
Chloride: 104 mEq/L (ref 96–112)
Creatinine, Ser: 0.95 mg/dL (ref 0.40–1.50)
GFR: 93.47 mL/min (ref >60.00)
Glucose, Bld: 94 mg/dL (ref 70–99)
Potassium: 4.2 mEq/L (ref 3.5–5.1)
Sodium: 140 mEq/L (ref 135–145)

## 2018-09-20 ENCOUNTER — Other Ambulatory Visit: Payer: Self-pay | Admitting: Cardiovascular Disease

## 2018-10-06 ENCOUNTER — Other Ambulatory Visit: Payer: Self-pay | Admitting: Cardiovascular Disease

## 2018-10-22 NOTE — Telephone Encounter (Signed)
Opened in error

## 2018-11-24 ENCOUNTER — Other Ambulatory Visit: Payer: Self-pay | Admitting: Family Medicine

## 2018-11-27 ENCOUNTER — Other Ambulatory Visit: Payer: Self-pay

## 2018-11-27 ENCOUNTER — Ambulatory Visit (INDEPENDENT_AMBULATORY_CARE_PROVIDER_SITE_OTHER): Payer: Medicare Other | Admitting: Cardiology

## 2018-11-27 ENCOUNTER — Encounter: Payer: Self-pay | Admitting: Cardiology

## 2018-11-27 VITALS — BP 182/96 | HR 67 | Ht 69.0 in | Wt 186.0 lb

## 2018-11-27 DIAGNOSIS — E782 Mixed hyperlipidemia: Secondary | ICD-10-CM

## 2018-11-27 DIAGNOSIS — I10 Essential (primary) hypertension: Secondary | ICD-10-CM | POA: Diagnosis not present

## 2018-11-27 DIAGNOSIS — Z951 Presence of aortocoronary bypass graft: Secondary | ICD-10-CM | POA: Diagnosis not present

## 2018-11-27 DIAGNOSIS — N400 Enlarged prostate without lower urinary tract symptoms: Secondary | ICD-10-CM | POA: Diagnosis not present

## 2018-11-27 MED ORDER — ATORVASTATIN CALCIUM 10 MG PO TABS: 10.0000 mg | ORAL_TABLET | Freq: Every day | ORAL | 3 refills | Status: DC

## 2018-11-27 MED ORDER — CARVEDILOL 3.125 MG PO TABS: 3.1250 mg | ORAL_TABLET | Freq: Two times a day (BID) | ORAL | 3 refills | Status: DC

## 2018-11-27 MED ORDER — VALSARTAN 160 MG PO TABS: 160.0000 mg | ORAL_TABLET | Freq: Every day | ORAL | 0 refills | Status: DC

## 2018-11-27 MED ORDER — DOXAZOSIN MESYLATE 2 MG PO TABS: 2.0000 mg | ORAL_TABLET | Freq: Every day | ORAL | 3 refills | Status: DC

## 2018-11-27 NOTE — Assessment & Plan Note (Signed)
CABG x 1 1994-low risk Myoview 2017

## 2018-11-27 NOTE — Assessment & Plan Note (Addendum)
Followed by urology and is on Flomax

## 2018-11-27 NOTE — Progress Notes (Signed)
Cardiology Office Note:    Date:  11/27/2018   ID:  Jerry Farrell, Jerry Farrell [birth year], MRN [id number]  PCP:  Abner Greenspan, MD  Cardiologist:  Quay Burow, MD  Electrophysiologist:  None   Referring MD: Abner Greenspan, MD   Chief Complaint  Patient presents with  . Follow-up  Increased B/P  History of Present Illness:    Jerry Farrell is a pleasant 74 y.o.AA male with a hx of CAD, HLD, HTN, and BPH.  The patient had CABG x1 in 1994.  Myoview in 2017 was low risk.  He had been on HCTZ for hypertension but this was discontinued because of hyponatremia.  He is currently on carvedilol at a low dose because of resting bradycardia and valsartan.  He is noticed his blood pressures been trending high lately.  Rechecked by me was 172/80.  He says he thinks that is pretty much what is been running at home.  He denies any chest pain or unusual shortness of breath.  He says he tries to watch his sodium intake.  Past Medical History:  Diagnosis Date  . BPH (benign prostatic hyperplasia)   . CAD (coronary artery disease)    cardiolite ok 3/05, stress test- low risk study 11/06  . Chronic low back pain    with radiculopathy  . ED (erectile dysfunction)   . Gallstones    abd [place]- stable hamangioma, gallbladder sludge and tiny stones 09/2003  . GERD (gastroesophageal reflux disease)   . Hyperlipidemia   . Hypertension   . Transfusion history     Past Surgical History:  Procedure Laterality Date  . CATARACT EXTRACTION W/PHACO  03/13/2012   Procedure: CATARACT EXTRACTION PHACO AND INTRAOCULAR LENS PLACEMENT (IOC);  Surgeon: Adonis Brook, MD;  Location: [place];  Service: Ophthalmology;  Laterality: Left;  . CORONARY ARTERY BYPASS GRAFT  1998   x 1  [facility]  . ESOPHAGOGASTRODUODENOSCOPY (EGD) WITH PROPOFOL N/A 06/07/2016   Procedure: ESOPHAGOGASTRODUODENOSCOPY (EGD) WITH PROPOFOL;  Surgeon: Mauri Pole, MD;  [facility];  Service: Endoscopy;  Laterality: N/A;  .  GASTRECTOMY     partial  . MEMBRANE PEEL  03/13/2012   Procedure: MEMBRANE PEEL;  Surgeon: Adonis Brook, MD;  Location: [place];  Service: Ophthalmology;  Laterality: Left;  . PARS PLANA VITRECTOMY  03/13/2012   Procedure: PARS PLANA VITRECTOMY WITH 23 GAUGE;  Surgeon: Adonis Brook, MD;  Location: [place];  Service: Ophthalmology;  Laterality: Left;    Current Medications: Current Meds  Medication Sig  . aspirin EC 81 MG tablet Take 1 tablet (81 mg total) by mouth daily.  Marland Kitchen atorvastatin (LIPITOR) 10 MG tablet Take 1 tablet (10 mg total) by mouth daily.  . carvedilol (COREG) 3.125 MG tablet Take 1 tablet (3.125 mg total) by mouth 2 (two) times daily with a meal.  . dorzolamide-timolol (COSOPT) 22.3-6.8 MG/ML ophthalmic solution Place 1 drop into both eyes 2 (two) times daily. Use as directed   . ferrous sulfate 325 (65 FE) MG tablet Take 1 tablet (325 mg total) by mouth 2 (two) times daily with a meal.  . pantoprazole (PROTONIX) 40 MG tablet Take 40 mg by mouth 2 (two) times daily.  . tamsulosin (FLOMAX) 0.4 MG CAPS capsule Take 1 capsules by mouth once daily  . valsartan (DIOVAN) 160 MG tablet Take 1 tablet (160 mg total) by mouth daily.  . [DISCONTINUED] atorvastatin (LIPITOR) 10 MG tablet Take 1 tablet (10 mg total) by mouth daily.  . [  DISCONTINUED] carvedilol (COREG) 3.125 MG tablet TAKE 1 TABLET (3.125 MG TOTAL) BY MOUTH 2 (TWO) TIMES DAILY WITH A MEAL.  . [DISCONTINUED] valsartan (DIOVAN) 160 MG tablet Take 1 tablet (160 mg total) by mouth daily.     Allergies:   Patient has no known allergies.   Social History   Socioeconomic History  . Marital status: Married    Spouse name: Not on file  . Number of children: 3  . Years of education: Not on file  . Highest education level: Not on file  Occupational History  . Occupation: Mining engineer: RETIRED  Social Needs  . Financial resource strain: Not on file  . Food insecurity    Worry: Not on file    Inability: Not on file   . Transportation needs    Medical: Not on file    Non-medical: Not on file  Tobacco Use  . Smoking status: Never Smoker  . Smokeless tobacco: Never Used  Substance and Sexual Activity  . Alcohol use: No    Alcohol/week: 0.0 standard drinks  . Drug use: No  . Sexual activity: Yes  Lifestyle  . Physical activity    Days per week: Not on file    Minutes per session: Not on file  . Stress: Not on file  Relationships  . Social Herbalist on phone: Not on file    Gets together: Not on file    Attends religious service: Not on file    Active member of club or organization: Not on file    Attends meetings of clubs or organizations: Not on file    Relationship status: Not on file  Other Topics Concern  . Not on file  Social History Narrative  . Not on file     Family History: The patient's family history includes Cancer in his sister; Colon cancer in his brother and brother; Hypertension in his mother; Throat cancer in his brother. There is no history of Stomach cancer or Pancreatic cancer.  ROS:   Please see the history of present illness.     All other systems reviewed and are negative.  EKGs/Labs/Other Studies Reviewed:    The following studies were reviewed today: Myoview Dec 2017  Recent Labs: 05/08/2018: TSH 0.40 08/28/2018: ALT 21; Hemoglobin 15.7; Platelets 153.0 09/12/2018: BUN 13; Creatinine, Ser 0.95; Potassium 4.2; Sodium 140  Recent Lipid Panel    Component Value Date/Time   CHOL 176 05/08/2018 0920   TRIG 219.0 (H) 05/08/2018 0920   HDL 47.80 05/08/2018 0920   CHOLHDL 4 05/08/2018 0920   VLDL 43.8 (H) 05/08/2018 0920   LDLCALC 67 03/26/2017 1640   LDLDIRECT 80.0 05/08/2018 0920    Physical Exam:    VS:  BP (!) 182/96   Pulse 67   Ht 5\' 9"  (1.753 m)   Wt 186 lb (84.4 kg)   SpO2 98%   BMI 27.47 kg/m     Wt Readings from Last 3 Encounters:  11/27/18 186 lb (84.4 kg)  09/05/18 179 lb (81.2 kg)  08/30/18 179 lb 7 oz (81.4 kg)     GEN:   Well nourished, well developed in no acute distress HEENT: Normal NECK: No JVD; No carotid bruits LYMPHATICS: No lymphadenopathy CARDIAC: RRR, no murmurs, rubs, gallops RESPIRATORY:  Clear to auscultation without rales, wheezing or rhonchi  ABDOMEN: Soft, non-tender, non-distended MUSCULOSKELETAL:  No edema; No deformity  SKIN: Warm and dry NEUROLOGIC:  Alert and oriented x 3 PSYCHIATRIC:  Normal affect   ASSESSMENT:    Essential hypertension B/P is running high- add Cardura Q HS (he has BPH, unable to increase Coreg secondary to low HR and he is intolerant to HCTZ secondary to hyponatremia).  Consider addition of Amlodipine vs increasing Valsartan to 320 mg at f/u OV.   Hx of CABG CABG x 1 1994-low risk Myoview 2017  BPH (benign prostatic hyperplasia) Followed by urology and is on Flomax  PLAN:    Add Cardura 2 mg Q HS.  F/U OV with me 3 weeks.    Medication Adjustments/Labs and Tests Ordered: Current medicines are reviewed at length with the patient today.  Concerns regarding medicines are outlined above.  No orders of the defined types were placed in this encounter.  Meds ordered this encounter  Medications  . carvedilol (COREG) 3.125 MG tablet    Sig: Take 1 tablet (3.125 mg total) by mouth 2 (two) times daily with a meal.    Dispense:  180 tablet    Refill:  3  . valsartan (DIOVAN) 160 MG tablet    Sig: Take 1 tablet (160 mg total) by mouth daily.    Dispense:  90 tablet    Refill:  0  . atorvastatin (LIPITOR) 10 MG tablet    Sig: Take 1 tablet (10 mg total) by mouth daily.    Dispense:  90 tablet    Refill:  3  . doxazosin (CARDURA) 2 MG tablet    Sig: Take 1 tablet (2 mg total) by mouth daily.    Dispense:  90 tablet    Refill:  3    Patient Instructions  Medication Instructions:  START Cardura 2mg  take 1 tablet daily at bedtime  If you need a refill on your cardiac medications before your next appointment, please call your pharmacy.   Lab work: None   If you have labs (blood work) drawn today and your tests are completely normal, you will receive your results only by: Marland Kitchen MyChart Message (if you have MyChart) OR . A paper copy in the mail If you have any lab test that is abnormal or we need to change your treatment, we will call you to review the results.  Testing/Procedures: None   Follow-Up: At [facility], you and your health needs are our priority.  As part of our continuing mission to provide you with exceptional heart care, we have created designated Provider Care Teams.  These Care Teams include your primary Cardiologist (physician) and Advanced Practice Providers (APPs -  Physician Assistants and Nurse Practitioners) who all work together to provide you with the care you need, when you need it.  . Your physician recommends that you schedule a follow-up appointment in: 3-4 weeks with Kerin Ransom, PA-C  Any Other Special Instructions Will Be Listed Below (If Applicable).      Signed, Kerin Ransom, PA-C  11/27/2018 12:11 PM    [facility]

## 2018-11-27 NOTE — Patient Instructions (Signed)
Medication Instructions:  START Cardura 2mg  take 1 tablet daily at bedtime  If you need a refill on your cardiac medications before your next appointment, please call your pharmacy.   Lab work: None  If you have labs (blood work) drawn today and your tests are completely normal, you will receive your results only by: Marland Kitchen MyChart Message (if you have MyChart) OR . A paper copy in the mail If you have any lab test that is abnormal or we need to change your treatment, we will call you to review the results.  Testing/Procedures: None   Follow-Up: At [facility], you and your health needs are our priority.  As part of our continuing mission to provide you with exceptional heart care, we have created designated Provider Care Teams.  These Care Teams include your primary Cardiologist (physician) and Advanced Practice Providers (APPs -  Physician Assistants and Nurse Practitioners) who all work together to provide you with the care you need, when you need it.  . Your physician recommends that you schedule a follow-up appointment in: 3-4 weeks with Kerin Ransom, PA-C  Any Other Special Instructions Will Be Listed Below (If Applicable).

## 2018-11-27 NOTE — Assessment & Plan Note (Signed)
B/P is running high- add Cardura Q HS (he has BPH, unable to increase Coreg secondary to low HR and he is intolerant to HCTZ secondary to hyponatremia).  Consider addition of Amlodipine vs increasing Valsartan to 320 mg at f/u OV.

## 2018-12-19 ENCOUNTER — Encounter: Payer: Self-pay | Admitting: Cardiology

## 2018-12-19 ENCOUNTER — Other Ambulatory Visit: Payer: Self-pay

## 2018-12-19 ENCOUNTER — Ambulatory Visit (INDEPENDENT_AMBULATORY_CARE_PROVIDER_SITE_OTHER): Payer: Medicare Other | Admitting: Cardiology

## 2018-12-19 DIAGNOSIS — Z951 Presence of aortocoronary bypass graft: Secondary | ICD-10-CM | POA: Diagnosis not present

## 2018-12-19 DIAGNOSIS — I10 Essential (primary) hypertension: Secondary | ICD-10-CM | POA: Diagnosis not present

## 2018-12-19 DIAGNOSIS — E785 Hyperlipidemia, unspecified: Secondary | ICD-10-CM | POA: Diagnosis not present

## 2018-12-19 MED ORDER — AMLODIPINE BESYLATE 5 MG PO TABS: 5.0000 mg | ORAL_TABLET | Freq: Every day | ORAL | 3 refills | Status: DC

## 2018-12-19 NOTE — Assessment & Plan Note (Signed)
CABG x 1 1994-low risk Myoview 2017 

## 2018-12-19 NOTE — Assessment & Plan Note (Signed)
B/P still not under good control- add Amlodipine 5 mg.  F/U B/P with a pharmacist in 4 weeks.

## 2018-12-19 NOTE — Progress Notes (Signed)
Cardiology Office Note:    Date:  12/19/2018   ID:  Jerry, Farrell 02/04/1946, MRN [id number]  PCP:  Abner Greenspan, MD  Cardiologist:  Quay Burow, MD  Electrophysiologist:  None   Referring MD: Abner Greenspan, MD   No chief complaint on file.   History of Present Illness:    Jerry Farrell is a pleasant 73 y.o. AA male with a hx of CAD, HLD, HTN, and BPH.  The patient had CABG x1 in 1994.  Myoview done in 2017 was low risk.  He had been on HCTZ for hypertension but this was discontinued because of hyponatremia (Na+ 129 in May 2020).  He is on low dose Coreg because of resting bradycardia, and he is on valsartan 120 mg.  He has noticed his blood pressures have been trending high lately.  I saw him in the office 11/27/2018 and added Cardura 2 mg Q HS.  He returns today for follow up.  He has had some improvement but his B/P is still too high.  He says his systolic B/P at home runs in the Q000111Q, diastolic in the 99991111.  Repeat B/P by me was 166/82 on the Lt and 168/84 on the Rt.  He has otherwise done well, no chest pain or unusual dyspnea.   Past Medical History:  Diagnosis Date  . BPH (benign prostatic hyperplasia)   . CAD (coronary artery disease)    cardiolite ok 3/05, stress test- low risk study 11/06  . Chronic low back pain    with radiculopathy  . ED (erectile dysfunction)   . Gallstones    abd [place]- stable hamangioma, gallbladder sludge and tiny stones 09/2003  . GERD (gastroesophageal reflux disease)   . Hyperlipidemia   . Hypertension   . Transfusion history     Past Surgical History:  Procedure Laterality Date  . CATARACT EXTRACTION W/PHACO  03/13/2012   Procedure: CATARACT EXTRACTION PHACO AND INTRAOCULAR LENS PLACEMENT (IOC);  Surgeon: Adonis Brook, MD;  Location: [facility];  Service: Ophthalmology;  Laterality: Left;  . CORONARY ARTERY BYPASS GRAFT  1998   x 1  done [facility]  . ESOPHAGOGASTRODUODENOSCOPY (EGD) WITH PROPOFOL N/A 06/07/2016   Procedure:  ESOPHAGOGASTRODUODENOSCOPY (EGD) WITH PROPOFOL;  Surgeon: Mauri Pole, MD;  Location: [facility];  Service: Endoscopy;  Laterality: N/A;  . GASTRECTOMY     partial  . MEMBRANE PEEL  03/13/2012   Procedure: MEMBRANE PEEL;  Surgeon: Adonis Brook, MD;  Location: [facility];  Service: Ophthalmology;  Laterality: Left;  . PARS PLANA VITRECTOMY  03/13/2012   Procedure: PARS PLANA VITRECTOMY WITH 23 GAUGE;  Surgeon: Adonis Brook, MD;  Location: [facility];  Service: Ophthalmology;  Laterality: Left;    Current Medications: Current Meds  Medication Sig  . aspirin EC 81 MG tablet Take 1 tablet (81 mg total) by mouth daily.  Marland Kitchen atorvastatin (LIPITOR) 10 MG tablet Take 1 tablet (10 mg total) by mouth daily.  . carvedilol (COREG) 3.125 MG tablet Take 1 tablet (3.125 mg total) by mouth 2 (two) times daily with a meal.  . dorzolamide-timolol (COSOPT) 22.3-6.8 MG/ML ophthalmic solution Place 1 drop into both eyes 2 (two) times daily. Use as directed   . doxazosin (CARDURA) 2 MG tablet Take 1 tablet (2 mg total) by mouth daily.  . ferrous sulfate 325 (65 FE) MG tablet Take 1 tablet (325 mg total) by mouth 2 (two) times daily with a meal.  . pantoprazole (PROTONIX) 40 MG tablet  Take 40 mg by mouth 2 (two) times daily.  . tamsulosin (FLOMAX) 0.4 MG CAPS capsule Take 1 capsules by mouth once daily  . valsartan (DIOVAN) 160 MG tablet Take 1 tablet (160 mg total) by mouth daily.     Allergies:   Patient has no known allergies.   Social History   Socioeconomic History  . Marital status: Married    Spouse name: Not on file  . Number of children: 3  . Years of education: Not on file  . Highest education level: Not on file  Occupational History  . Occupation: Mining engineer: RETIRED  Social Needs  . Financial resource strain: Not on file  . Food insecurity    Worry: Not on file    Inability: Not on file  . Transportation needs    Medical: Not on file    Non-medical: Not on file  Tobacco Use   . Smoking status: Never Smoker  . Smokeless tobacco: Never Used  Substance and Sexual Activity  . Alcohol use: No    Alcohol/week: 0.0 standard drinks  . Drug use: No  . Sexual activity: Yes  Lifestyle  . Physical activity    Days per week: Not on file    Minutes per session: Not on file  . Stress: Not on file  Relationships  . Social Herbalist on phone: Not on file    Gets together: Not on file    Attends religious service: Not on file    Active member of club or organization: Not on file    Attends meetings of clubs or organizations: Not on file    Relationship status: Not on file  Other Topics Concern  . Not on file  Social History Narrative  . Not on file     Family History: The patient's family history includes Cancer in his sister; Colon cancer in his brother and brother; Hypertension in his mother; Throat cancer in his brother. There is no history of Stomach cancer or Pancreatic cancer.  ROS:   Please see the history of present illness.     All other systems reviewed and are negative.  EKGs/Labs/Other Studies Reviewed:     Recent Labs: 05/08/2018: TSH 0.40 08/28/2018: ALT 21; Hemoglobin 15.7; Platelets 153.0 09/12/2018: BUN 13; Creatinine, Ser 0.95; Potassium 4.2; Sodium 140  Recent Lipid Panel    Component Value Date/Time   CHOL 176 05/08/2018 0920   TRIG 219.0 (H) 05/08/2018 0920   HDL 47.80 05/08/2018 0920   CHOLHDL 4 05/08/2018 0920   VLDL 43.8 (H) 05/08/2018 0920   LDLCALC 67 03/26/2017 1640   LDLDIRECT 80.0 05/08/2018 0920    Physical Exam:    VS:  BP (!) 182/86   Pulse 60   Ht 5' 8.5" (1.74 m)   Wt 184 lb 6.4 oz (83.6 kg)   SpO2 98%   BMI 27.63 kg/m     Wt Readings from Last 3 Encounters:  12/19/18 184 lb 6.4 oz (83.6 kg)  11/27/18 186 lb (84.4 kg)  09/05/18 179 lb (81.2 kg)     GEN: Well nourished, well developed in no acute distress HEENT: Normal NECK: No JVD; No carotid bruits LYMPHATICS: No lymphadenopathy CARDIAC:  RRR, no murmurs, rubs, gallops RESPIRATORY:  Clear to auscultation without rales, wheezing or rhonchi  ABDOMEN: Soft, non-tender, non-distended, abdominal scar noted MUSCULOSKELETAL:  No edema; No deformity  SKIN: Warm and dry NEUROLOGIC:  Alert and oriented x 3 PSYCHIATRIC:  Normal affect  ASSESSMENT:    Essential hypertension B/P still not under good control- add Amlodipine 5 mg.  F/U B/P with a pharmacist in 4 weeks.  Hx of CABG CABG x 1 1994-low risk Myoview 2017  Dyslipidemia, goal LDL below 70 LDL 80 in Jan 2020  PLAN:    Add Amlodipine 5 mg.  F/U B/P check with a pharmacist in 3-4 weeks.  Consider increasing Valsartan or re challenging him with a diuretic if his B/P is still > [id number] systolic then.   Medication Adjustments/Labs and Tests Ordered: Current medicines are reviewed at length with the patient today.  Concerns regarding medicines are outlined above.  No orders of the defined types were placed in this encounter.  Meds ordered this encounter  Medications  . amLODipine (NORVASC) 5 MG tablet    Sig: Take 1 tablet (5 mg total) by mouth daily.    Dispense:  90 tablet    Refill:  3    Patient Instructions  Medication Instructions:  START Norvasc 5mg  take 1 tablet once a day  If you need a refill on your cardiac medications before your next appointment, please call your pharmacy.   Lab work: None  If you have labs (blood work) drawn today and your tests are completely normal, you will receive your results only by: Marland Kitchen MyChart Message (if you have MyChart) OR . A paper copy in the mail If you have any lab test that is abnormal or we need to change your treatment, we will call you to review the results.  Testing/Procedures: None   Follow-Up: At [facility], you and your health needs are our priority.  As part of our continuing mission to provide you with exceptional heart care, we have created designated Provider Care Teams.  These Care Teams include your  primary Cardiologist (physician) and Advanced Practice Providers (APPs -  Physician Assistants and Nurse Practitioners) who all work together to provide you with the care you need, when you need it. You will need a follow up appointment in 6 months.  Please call our office 2 months in advance to schedule this appointment.  You may see Quay Burow, MD or one of the following Advanced Practice Providers on your designated Care Team:   Kerin Ransom, PA-C Roby Lofts, [place] . Sande Rives, PA-C  Your physician recommends that you schedule a follow-up appointment in: 3-4 weeks with PharmD  Any Other Special Instructions Will Be Listed Below (If Applicable).      Signed, Kerin Ransom, PA-C  12/19/2018 4:21 PM     [facility]

## 2018-12-19 NOTE — Assessment & Plan Note (Signed)
LDL 80 in Jan 2020

## 2018-12-19 NOTE — Patient Instructions (Addendum)
Medication Instructions:  START Norvasc 5mg  take 1 tablet once a day  If you need a refill on your cardiac medications before your next appointment, please call your pharmacy.   Lab work: None  If you have labs (blood work) drawn today and your tests are completely normal, you will receive your results only by: Marland Kitchen MyChart Message (if you have MyChart) OR . A paper copy in the mail If you have any lab test that is abnormal or we need to change your treatment, we will call you to review the results.  Testing/Procedures: None   Follow-Up: At [facility], you and your health needs are our priority.  As part of our continuing mission to provide you with exceptional heart care, we have created designated Provider Care Teams.  These Care Teams include your primary Cardiologist (physician) and Advanced Practice Providers (APPs -  Physician Assistants and Nurse Practitioners) who all work together to provide you with the care you need, when you need it. You will need a follow up appointment in 6 months.  Please call our office 2 months in advance to schedule this appointment.  You may see Quay Burow, MD or one of the following Advanced Practice Providers on your designated Care Team:   Kerin Ransom, PA-C Roby Lofts, [place] . Sande Rives, PA-C  Your physician recommends that you schedule a follow-up appointment in: 3-4 weeks with PharmD  Any Other Special Instructions Will Be Listed Below (If Applicable).

## 2019-01-09 ENCOUNTER — Ambulatory Visit (INDEPENDENT_AMBULATORY_CARE_PROVIDER_SITE_OTHER): Payer: Medicare Other | Admitting: Pharmacist Clinician (PhC)/ Clinical Pharmacy Specialist

## 2019-01-09 ENCOUNTER — Other Ambulatory Visit: Payer: Self-pay

## 2019-01-09 DIAGNOSIS — I10 Essential (primary) hypertension: Secondary | ICD-10-CM

## 2019-01-09 NOTE — Patient Instructions (Addendum)
Return for a a follow up appointment in November  Your blood pressure today is 138/74  (goal is < 130/80)  Check your blood pressure at home twice daily 3-4 times per week and keep record of the readings.  Take your BP meds as follows:  Continue with all current medications  Bring all of your meds, your BP cuff and your record of home blood pressures to your next appointment.  Exercise as you're able, try to walk approximately 30 minutes per day.  Keep salt intake to a minimum, especially watch canned and prepared boxed foods.  Eat more fresh fruits and vegetables and fewer canned items.  Avoid eating in fast food restaurants.    HOW TO TAKE YOUR BLOOD PRESSURE: . Rest 5 minutes before taking your blood pressure. .  Don't smoke or drink caffeinated beverages for at least 30 minutes before. . Take your blood pressure before (not after) you eat. . Sit comfortably with your back supported and both feet on the floor (don't cross your legs). . Elevate your arm to heart level on a table or a desk. . Use the proper sized cuff. It should fit smoothly and snugly around your bare upper arm. There should be enough room to slip a fingertip under the cuff. The bottom edge of the cuff should be 1 inch above the crease of the elbow. . Ideally, take 3 measurements at one sitting and record the average.

## 2019-01-09 NOTE — Progress Notes (Signed)
01/09/2019 KAIS Farrell 04/29/1945 [id number]   HPI:  Jerry Farrell is a 73 y.o. male patient of Dr Gwenlyn Found, with a PMH below who presents today for hypertension clinic evaluation.  See medical history below.  Patient saw Kerin Ransom PA in August.  At that time his in office systolic pressure was in the 160's and he had been on the doxazosin for only 3-4 weeks.  Amlodipine 5 mg was added on Aug 27.  Patient is here today for follow up.   He presents today feeling well overall. BP reading was 138/74 with verbal reports of improved numbers from home readings. He did forget to bring his home BP readings today. Patient did express some concern about feeling "swimmie headed" when outdoors while cutting the grass as a result from bending over. He also mentioned a decrease in physical activity due to "lazziness" but did walk 1-2 miles 1x this past week. He stated he used to do that multiple times per week.  Past Medical History: Hyperlipidemia 12020:  TC 176, TG 219, HDL 47.8, LDL 80  CAD S/p CABG x 1 in 1994  BPH On tamsulosin and doxazosin 2 mg  Pre-diabetes A1c 04/2018 5.8, previously 6.0        Blood Pressure Goal:  130/80  Current Medications:  Amlodipine 5 mg qd, carvedilol 3.125 mg bid, doxazosin 2 mg qd, valsartan 160 mg qd  Family Hx:  Sister: HLD, CABG Brother: HLD, CABG Children- no information  Social Hx: denies tobacco use, denies alcohol use  Diet: loves pistachio's; Eats chicken & hamburgers as primary protein, canned vegetables, wife cooks at home  Exercise: walks to mow the grass, reports less daily  Outdoor walking over the last few months due to him being lazy  Home BP readings: patient forgot his sheet, verbalized 120-140/60-70  Intolerances: had previously been on hctz, but chart notes discontinued due to hyponatremia  Labs:  08/2018:  Na 140, K 4.2, Glu 94, BUN 13, SCr 0.95 (CrCl 79.4)   Wt Readings from Last 3 Encounters:  01/09/19 186 lb 12.8 oz (84.7 kg)   12/19/18 184 lb 6.4 oz (83.6 kg)  11/27/18 186 lb (84.4 kg)   BP Readings from Last 3 Encounters:  01/09/19 138/74  12/19/18 (!) 182/86  11/27/18 (!) 182/96   Pulse Readings from Last 3 Encounters:  01/09/19 72  12/19/18 60  11/27/18 67    Current Outpatient Medications  Medication Sig Dispense Refill  . amLODipine (NORVASC) 5 MG tablet Take 1 tablet (5 mg total) by mouth daily. 90 tablet 3  . aspirin EC 81 MG tablet Take 1 tablet (81 mg total) by mouth daily. 90 tablet 3  . atorvastatin (LIPITOR) 10 MG tablet Take 1 tablet (10 mg total) by mouth daily. 90 tablet 3  . carvedilol (COREG) 3.125 MG tablet Take 1 tablet (3.125 mg total) by mouth 2 (two) times daily with a meal. 180 tablet 3  . dorzolamide-timolol (COSOPT) 22.3-6.8 MG/ML ophthalmic solution Place 1 drop into both eyes 2 (two) times daily. Use as directed     . doxazosin (CARDURA) 2 MG tablet Take 1 tablet (2 mg total) by mouth daily. 90 tablet 3  . ferrous sulfate 325 (65 FE) MG tablet Take 1 tablet (325 mg total) by mouth 2 (two) times daily with a meal. 30 tablet 0  . pantoprazole (PROTONIX) 40 MG tablet Take 40 mg by mouth 2 (two) times daily.    . tamsulosin (FLOMAX) 0.4 MG  CAPS capsule Take 1 capsules by mouth once daily 90 capsule 3  . valsartan (DIOVAN) 160 MG tablet Take 1 tablet (160 mg total) by mouth daily. 90 tablet 0   No current facility-administered medications for this visit.     No Known Allergies  Past Medical History:  Diagnosis Date  . BPH (benign prostatic hyperplasia)   . CAD (coronary artery disease)    cardiolite ok 3/05, stress test- low risk study 11/06  . Chronic low back pain    with radiculopathy  . ED (erectile dysfunction)   . Gallstones    abd [place]- stable hamangioma, gallbladder sludge and tiny stones 09/2003  . GERD (gastroesophageal reflux disease)   . Hyperlipidemia   . Hypertension   . Transfusion history     Blood pressure 138/74, pulse 72, resp. rate 16, height 5\' 8"   (1.727 m), weight 186 lb 12.8 oz (84.7 kg), SpO2 99 %.  Essential hypertension 1) No changes to current BP medications 2) We have asked him to check his home readings of 3x per week both AM/PM readings 3) Requested he resume walking 1-2x/week 4) Educated to not add salt and try to rinse canned vegetables before consuming 5) F/U November 17th to assess home BP readings   Ned Clines, PharmD Candidate [phone] [facility]  I was with student and patient throughout visit and agree with above assessment.   Tommy Medal PharmD CPP [facility] [address] [place], [place] [address] [phone]

## 2019-01-09 NOTE — Assessment & Plan Note (Addendum)
1) No changes to current BP medications 2) We have asked him to check his home readings of 3x per week both AM/PM readings 3) Requested he resume walking 1-2x/week 4) Educated to not add salt and try to rinse canned vegetables before consuming 5) F/U November 17th to assess home BP readings

## 2019-01-15 ENCOUNTER — Other Ambulatory Visit: Payer: Self-pay

## 2019-01-15 DIAGNOSIS — Z20822 Contact with and (suspected) exposure to covid-19: Secondary | ICD-10-CM

## 2019-01-17 LAB — NOVEL CORONAVIRUS, NAA: SARS-CoV-2, NAA: DETECTED — AB

## 2019-01-18 ENCOUNTER — Telehealth: Payer: Self-pay | Admitting: Critical Care Medicine

## 2019-01-18 NOTE — Telephone Encounter (Signed)
I was able to reach this patient today and he is completely asymptomatic.  His wife was ill earlier last week and she tested positive from a [facility] test for COVID.  The patient came to our [facility] testing event on 23 September and did test positive.  He actually is having no real symptoms at this time.  And never had any symptoms including fever.  I indicated to him he needs to stay in isolation till October 3.  He will take this under advisement.  I also told him to follow-up with his primary care provider in the following week.

## 2019-01-19 NOTE — Telephone Encounter (Signed)
Sh[place]-please check in with him on Monday   Thanks Fraser Din!

## 2019-01-20 NOTE — Telephone Encounter (Signed)
Called to check on pt. He said he still never developed any sxs. Only thing he has is a little fatigue but no other sxs. Pt thanked Dr. Glori Bickers for checking on him. Pt advise if he develops any new sxs at all to update [place]. Pt verbalized understanding

## 2019-01-30 ENCOUNTER — Telehealth: Payer: Self-pay | Admitting: *Deleted

## 2019-01-30 NOTE — Telephone Encounter (Signed)
Pt left a VM at Triage. Pt was positive for covid on 01/15/19, pt wanted to know when should he be retested for covid pt said he has no sxs but wanted to know when he needs to be retested

## 2019-01-30 NOTE — Telephone Encounter (Signed)
I do not think he needs to be re tested  If he is symptom free and it has been 2 weeks that is good

## 2019-01-31 NOTE — Telephone Encounter (Signed)
Pt notified of Dr. Tower's comments  

## 2019-02-10 DIAGNOSIS — H35372 Puckering of macula, left eye: Secondary | ICD-10-CM | POA: Diagnosis not present

## 2019-02-10 DIAGNOSIS — H40053 Ocular hypertension, bilateral: Secondary | ICD-10-CM | POA: Diagnosis not present

## 2019-02-10 DIAGNOSIS — H04121 Dry eye syndrome of right lacrimal gland: Secondary | ICD-10-CM | POA: Diagnosis not present

## 2019-02-20 ENCOUNTER — Other Ambulatory Visit: Payer: Self-pay | Admitting: Cardiology

## 2019-02-24 DIAGNOSIS — H35372 Puckering of macula, left eye: Secondary | ICD-10-CM | POA: Diagnosis not present

## 2019-02-24 DIAGNOSIS — H40053 Ocular hypertension, bilateral: Secondary | ICD-10-CM | POA: Diagnosis not present

## 2019-02-24 DIAGNOSIS — H04121 Dry eye syndrome of right lacrimal gland: Secondary | ICD-10-CM | POA: Diagnosis not present

## 2019-03-11 ENCOUNTER — Other Ambulatory Visit: Payer: Self-pay

## 2019-03-11 ENCOUNTER — Ambulatory Visit (INDEPENDENT_AMBULATORY_CARE_PROVIDER_SITE_OTHER): Payer: Medicare Other | Admitting: Pharmacist Clinician (PhC)/ Clinical Pharmacy Specialist

## 2019-03-11 ENCOUNTER — Encounter: Payer: Self-pay | Admitting: Pharmacist Clinician (PhC)/ Clinical Pharmacy Specialist

## 2019-03-11 VITALS — BP 152/76 | HR 64 | Ht 68.0 in | Wt 184.6 lb

## 2019-03-11 DIAGNOSIS — I10 Essential (primary) hypertension: Secondary | ICD-10-CM

## 2019-03-11 MED ORDER — VALSARTAN 320 MG PO TABS: 320.0000 mg | ORAL_TABLET | Freq: Every day | ORAL | 1 refills | Status: DC

## 2019-03-11 NOTE — Patient Instructions (Addendum)
Return for a a follow up appointment in 2 months  Go to the lab in 2 weeks for kidney function tests.  No appointment needed.  Lab open 8 am to 4 pm, closed from 12:45-1:45 for lunch  Your blood pressure today is 152/76  Check your blood pressure at home twice daily and keep record of the readings.  Take your BP meds as follows:  Take another valsartan (Diovan) 160 mg tablet tonight.  Then starting tomorrow, take 2 tablets (160 mg x 2) each evening until these are gone.  We will send a prescription to your pharmacy for the 320 mg tablets.   Continue with all other medications.   Bring all of your meds, your BP cuff and your record of home blood pressures to your next appointment.  Exercise as you're able, try to walk approximately 30 minutes per day.  Keep salt intake to a minimum, especially watch canned and prepared boxed foods.  Eat more fresh fruits and vegetables and fewer canned items.  Avoid eating in fast food restaurants.    HOW TO TAKE YOUR BLOOD PRESSURE: . Rest 5 minutes before taking your blood pressure. .  Don't smoke or drink caffeinated beverages for at least 30 minutes before. . Take your blood pressure before (not after) you eat. . Sit comfortably with your back supported and both feet on the floor (don't cross your legs). . Elevate your arm to heart level on a table or a desk. . Use the proper sized cuff. It should fit smoothly and snugly around your bare upper arm. There should be enough room to slip a fingertip under the cuff. The bottom edge of the cuff should be 1 inch above the crease of the elbow. . Ideally, take 3 measurements at one sitting and record the average.

## 2019-03-11 NOTE — Progress Notes (Signed)
03/12/2019 Jerry Farrell 1945-07-20 [id number]   HPI:  Jerry Farrell is a 73 y.o. male patient of Dr Gwenlyn Found, with a PMH below who presents today for hypertension clinic follow up.  See medical history below.  Patient saw Kerin Ransom PA in August. With systolic readings in the 99991111, amlodipine was added to his doxazosin, carvedilol and valsartan and he was referred to [facility] for follow up.    At first visit his BP was better, 138/74, however he had no home readings with him.  No medication changes were made and he was asked to check home readings regularly and return with that information, as well as home cuff today.    Today patient reports feeling well overall.  His only concern is some dizziness with bending over and shortness of breath when he carries anything heavy.  He does not have SOB with extended walking, just with added weight.  He did bring his home BP cuff, unfortunately, he unplugged the cuff from the machine and just brought the cuff.    Past Medical History: Hyperlipidemia 12020:  TC 176, TG 219, HDL 47.8, LDL 80  CAD S/p CABG x 1 in 1994  BPH On tamsulosin and doxazosin 2 mg  Pre-diabetes A1c 04/2018 5.8, previously 6.0        Blood Pressure Goal:  130/80  Current Medications:  Amlodipine 5 mg qd, carvedilol 3.125 mg bid, doxazosin 2 mg qd, valsartan 160 mg qd  Family Hx:  Sister: HLD, CABG Brother: HLD, CABG Children- no information  Social Hx: denies tobacco use, denies alcohol use  Diet: loves pistachio's; Eats chicken & hamburgers as primary protein, canned vegetables, wife cooks most meals at home, not often eating take out  Exercise: walks to mow the grass, reports less daily outdoor walking over the last few months due to him being lazy  Home BP readings: has Omron device, home readings show an average of 0000000 systolic in the mornings and Q000111Q systolic in the evenings.  All diastolic readings were WNL.    Intolerances: had previously been on hctz, but  chart notes discontinued due to hyponatremia  Labs:  08/2018:  Na 140, K 4.2, Glu 94, BUN 13, SCr 0.95 (CrCl 79.4)   Wt Readings from Last 3 Encounters:  03/11/19 184 lb 9.6 oz (83.7 kg)  01/09/19 186 lb 12.8 oz (84.7 kg)  12/19/18 184 lb 6.4 oz (83.6 kg)   BP Readings from Last 3 Encounters:  03/11/19 (!) 152/76  01/09/19 138/74  12/19/18 (!) 182/86   Pulse Readings from Last 3 Encounters:  03/11/19 64  01/09/19 72  12/19/18 60    Current Outpatient Medications  Medication Sig Dispense Refill  . aspirin EC 81 MG tablet Take 1 tablet (81 mg total) by mouth daily. 90 tablet 3  . atorvastatin (LIPITOR) 10 MG tablet Take 1 tablet (10 mg total) by mouth daily. 90 tablet 3  . carvedilol (COREG) 3.125 MG tablet Take 1 tablet (3.125 mg total) by mouth 2 (two) times daily with a meal. 180 tablet 3  . doxazosin (CARDURA) 2 MG tablet Take 1 tablet (2 mg total) by mouth daily. 90 tablet 3  . pantoprazole (PROTONIX) 40 MG tablet Take 40 mg by mouth 2 (two) times daily.    . tamsulosin (FLOMAX) 0.4 MG CAPS capsule Take 1 capsules by mouth once daily 90 capsule 3  . amLODipine (NORVASC) 5 MG tablet Take 1 tablet (5 mg total) by mouth daily. 90 tablet  3  . dorzolamide-timolol (COSOPT) 22.3-6.8 MG/ML ophthalmic solution Place 1 drop into both eyes 2 (two) times daily. Use as directed     . valsartan (DIOVAN) 320 MG tablet Take 1 tablet (320 mg total) by mouth daily. 90 tablet 1   No current facility-administered medications for this visit.     No Known Allergies  Past Medical History:  Diagnosis Date  . BPH (benign prostatic hyperplasia)   . CAD (coronary artery disease)    cardiolite ok 3/05, stress test- low risk study 11/06  . Chronic low back pain    with radiculopathy  . ED (erectile dysfunction)   . Gallstones    abd [place]- stable hamangioma, gallbladder sludge and tiny stones 09/2003  . GERD (gastroesophageal reflux disease)   . Hyperlipidemia   . Hypertension   . Transfusion  history     Blood pressure (!) 152/76, pulse 64, height 5\' 8"  (1.727 m), weight 184 lb 9.6 oz (83.7 kg).  Essential hypertension Patient with essential hypertension, still not to goal, but improved.  Will have him increase valsartan to 320 mg once daily and move this to evenings.  He is to continue with regular home BP checks and bring both cuff AND meter to next visit.  He will need to get a metabolic panel drawn in 2 weeks and return for follow up in 2 months.     Tommy Medal PharmD CPP [facility] [address] [place], [place] [address] [phone]

## 2019-03-12 NOTE — Assessment & Plan Note (Signed)
Patient with essential hypertension, still not to goal, but improved.  Will have him increase valsartan to 320 mg once daily and move this to evenings.  He is to continue with regular home BP checks and bring both cuff AND meter to next visit.  He will need to get a metabolic panel drawn in 2 weeks and return for follow up in 2 months.

## 2019-03-25 DIAGNOSIS — I10 Essential (primary) hypertension: Secondary | ICD-10-CM | POA: Diagnosis not present

## 2019-03-25 LAB — BASIC METABOLIC PANEL
BUN/Creatinine Ratio: 16 (ref 10–24)
BUN: 15 mg/dL (ref 8–27)
CO2: 23 mmol/L (ref 20–29)
Calcium: 9.2 mg/dL (ref 8.6–10.2)
Chloride: 102 mmol/L (ref 96–106)
Creatinine, Ser: 0.96 mg/dL (ref 0.76–1.27)
GFR calc Af Amer: 89 mL/min/{1.73_m2} (ref >59)
GFR calc non Af Amer: 77 mL/min/{1.73_m2} (ref >59)
Glucose: 85 mg/dL (ref 65–99)
Potassium: 4.2 mmol/L (ref 3.5–5.2)
Sodium: 140 mmol/L (ref 134–144)

## 2019-04-13 ENCOUNTER — Other Ambulatory Visit: Payer: Self-pay | Admitting: Family Medicine

## 2019-05-05 ENCOUNTER — Telehealth: Payer: Self-pay | Admitting: *Deleted

## 2019-05-05 NOTE — Telephone Encounter (Signed)
Pt notified of Dr. Tower's comments and verbalized understanding  

## 2019-05-05 NOTE — Telephone Encounter (Signed)
As long as he is 90 or more days out from covid (I believe he is since he had it in [place]) it should be ok

## 2019-05-05 NOTE — Telephone Encounter (Signed)
Patient left a voicemail stating that he is scheduled to get the covid vaccine. Patient was calling to make sure that it is okay for him to take the vaccine?

## 2019-05-06 ENCOUNTER — Other Ambulatory Visit: Payer: Self-pay

## 2019-05-06 ENCOUNTER — Ambulatory Visit (INDEPENDENT_AMBULATORY_CARE_PROVIDER_SITE_OTHER): Payer: Medicare Other | Admitting: Pharmacist Clinician (PhC)/ Clinical Pharmacy Specialist

## 2019-05-06 DIAGNOSIS — I10 Essential (primary) hypertension: Secondary | ICD-10-CM

## 2019-05-06 NOTE — Patient Instructions (Addendum)
Call Erasmo Downer at [phone] when you get home to let me know if you are currently taking the amlodipine 5mg .    Your blood pressure today is 146/68  Check your blood pressure at home once or twice daily, 3-4 times per week,  and keep record of the readings.  Take your BP meds as follows:    AM: amlodipine 10 mg, carvedilol 3.125,   PM: valsartan 320 mg, carvedilol 3.125 mg, doxazosin 2 mg  Bring all of your meds, your BP cuff and your record of home blood pressures to your next appointment.  Exercise as you're able, try to walk approximately 30 minutes per day.  Keep salt intake to a minimum, especially watch canned and prepared boxed foods.  Eat more fresh fruits and vegetables and fewer canned items.  Avoid eating in fast food restaurants.    HOW TO TAKE YOUR BLOOD PRESSURE: . Rest 5 minutes before taking your blood pressure. .  Don't smoke or drink caffeinated beverages for at least 30 minutes before. . Take your blood pressure before (not after) you eat. . Sit comfortably with your back supported and both feet on the floor (don't cross your legs). . Elevate your arm to heart level on a table or a desk. . Use the proper sized cuff. It should fit smoothly and snugly around your bare upper arm. There should be enough room to slip a fingertip under the cuff. The bottom edge of the cuff should be 1 inch above the crease of the elbow. . Ideally, take 3 measurements at one sitting and record the average.

## 2019-05-06 NOTE — Progress Notes (Signed)
05/06/2019 Jerry Farrell 1945/10/14 [id number]   HPI:  Jerry Farrell is a 74 y.o. male patient of Dr Gwenlyn Found, with a PMH below who presents today for hypertension clinic follow up.  See medical history below.  Patient saw Kerin Ransom PA in August. With systolic readings in the [id number], amlodipine was added to his doxazosin, carvedilol and valsartan and he was referred to [facility] for follow up.   We have seen him in [facility] several times since then.  He recently reported feeling dizziness with positional changes and some SOB with carrying heavy items (but not with extended walking).  At his last visit the valsartan was increased to 320 mg daily.  Today patient reports feeling well overall. He has no concerns about his medications and states compliance.   He notes that recently he will go out to get his newspaper and take a couple of laps around his building before going back in.  Diet without significant changes    Past Medical History: Hyperlipidemia 12020:  TC 176, TG 219, HDL 47.8, LDL 80  CAD S/p CABG x 1 in 1994  BPH On tamsulosin and doxazosin 2 mg  Pre-diabetes A1c 04/2018 5.8, previously 6.0        Blood Pressure Goal:  130/80  Current Medications:  Amlodipine 5 mg qd, carvedilol 3.125 mg bid, doxazosin 2 mg qd, valsartan 320 mg qd (pm)  Family Hx:  Sister: HLD, CABG Brother: HLD, CABG Children- no information  Social Hx: denies tobacco use, denies alcohol use  Diet: loves pistachio's; Eats chicken & hamburgers as primary protein, canned vegetables, wife cooks most meals at home, not often eating take out  Exercise: walks to mow the grass, reports less daily outdoor walking over the last few months due to him being lazy  Home BP readings: has Omron device, several years old;  home readings show an average of 0000000 systolic in the mornings and [id number] systolic in the evenings.  All diastolic readings were WNL.    Intolerances: had previously been on hctz, but chart notes discontinued  due to hyponatremia  Labs:  08/2018:  Na 140, K 4.2, Glu 94, BUN 13, SCr 0.95 (CrCl 79.4)  03/2019:  Na 140, K 4.2, Glu 85, BUN 15, SCr 0.96 (CrCl 78.7)  Wt Readings from Last 3 Encounters:  05/06/19 189 lb (85.7 kg)  03/11/19 184 lb 9.6 oz (83.7 kg)  01/09/19 186 lb 12.8 oz (84.7 kg)   BP Readings from Last 3 Encounters:  05/06/19 (!) 146/68  03/11/19 (!) 152/76  01/09/19 138/74   Pulse Readings from Last 3 Encounters:  05/06/19 72  03/11/19 64  01/09/19 72    Current Outpatient Medications  Medication Sig Dispense Refill  . aspirin EC 81 MG tablet Take 1 tablet (81 mg total) by mouth daily. 90 tablet 3  . atorvastatin (LIPITOR) 10 MG tablet Take 1 tablet (10 mg total) by mouth daily. 90 tablet 3  . carvedilol (COREG) 3.125 MG tablet Take 1 tablet (3.125 mg total) by mouth 2 (two) times daily with a meal. 180 tablet 3  . valsartan (DIOVAN) 320 MG tablet Take 1 tablet (320 mg total) by mouth daily. 90 tablet 1  . dorzolamide-timolol (COSOPT) 22.3-6.8 MG/ML ophthalmic solution Place 1 drop into both eyes 2 (two) times daily. Use as directed     . doxazosin (CARDURA) 2 MG tablet Take 1 tablet (2 mg total) by mouth daily. 90 tablet 3  . pantoprazole (PROTONIX) 40 MG  tablet TAKE 1 TABLET BY MOUTH EVERY DAY 90 tablet 0  . tamsulosin (FLOMAX) 0.4 MG CAPS capsule Take 1 capsules by mouth once daily 90 capsule 3   No current facility-administered medications for this visit.    No Known Allergies  Past Medical History:  Diagnosis Date  . BPH (benign prostatic hyperplasia)   . CAD (coronary artery disease)    cardiolite ok 3/05, stress test- low risk study 11/06  . Chronic low back pain    with radiculopathy  . ED (erectile dysfunction)   . Gallstones    abd [place]- stable hamangioma, gallbladder sludge and tiny stones 09/2003  . GERD (gastroesophageal reflux disease)   . Hyperlipidemia   . Hypertension   . Transfusion history     Blood pressure (!) 146/68, pulse 72, height 5\' 8"   (1.727 m), weight 189 lb (85.7 kg).  Essential hypertension Patient with hypertension, still not at goal, although some improvement.  Home morning readings at goal, however evenings still consistently 15 points higher.  Will have him increase amlodipine from 5 mg to 10 mg daily and continue regular monitoring.  He is scheduled to see Dr. Gwenlyn Found next month.  We can follow up with him after that should he continue to have problems.     Tommy Medal PharmD CPP [facility] [address] [place], [place] [address] [phone]

## 2019-05-06 NOTE — Assessment & Plan Note (Signed)
Patient with hypertension, still not at goal, although some improvement.  Home morning readings at goal, however evenings still consistently 15 points higher.  Will have him increase amlodipine from 5 mg to 10 mg daily and continue regular monitoring.  He is scheduled to see Dr. Gwenlyn Found next month.  We can follow up with him after that should he continue to have problems.

## 2019-05-11 ENCOUNTER — Other Ambulatory Visit: Payer: Self-pay | Admitting: Family Medicine

## 2019-05-13 ENCOUNTER — Telehealth: Payer: Self-pay | Admitting: Family Medicine

## 2019-05-13 DIAGNOSIS — I10 Essential (primary) hypertension: Secondary | ICD-10-CM

## 2019-05-13 DIAGNOSIS — E538 Deficiency of other specified B group vitamins: Secondary | ICD-10-CM

## 2019-05-13 DIAGNOSIS — D509 Iron deficiency anemia, unspecified: Secondary | ICD-10-CM

## 2019-05-13 DIAGNOSIS — R7303 Prediabetes: Secondary | ICD-10-CM

## 2019-05-13 DIAGNOSIS — E785 Hyperlipidemia, unspecified: Secondary | ICD-10-CM

## 2019-05-13 NOTE — Telephone Encounter (Signed)
-----   Message from Cloyd Stagers, RT sent at 05/09/2019  1:28 PM EST ----- Regarding: Lab Orders for Wednesday 1.20.2021 Please place lab orders for Wednesday 1.20.2021, office visit for physical on Tuesday 1.26.2021  Thank you, Dyke Maes RT(R)

## 2019-05-14 ENCOUNTER — Other Ambulatory Visit (INDEPENDENT_AMBULATORY_CARE_PROVIDER_SITE_OTHER): Payer: Medicare Other

## 2019-05-14 ENCOUNTER — Other Ambulatory Visit: Payer: Self-pay

## 2019-05-14 ENCOUNTER — Ambulatory Visit (INDEPENDENT_AMBULATORY_CARE_PROVIDER_SITE_OTHER): Payer: Medicare Other

## 2019-05-14 DIAGNOSIS — E785 Hyperlipidemia, unspecified: Secondary | ICD-10-CM | POA: Diagnosis not present

## 2019-05-14 DIAGNOSIS — I10 Essential (primary) hypertension: Secondary | ICD-10-CM | POA: Diagnosis not present

## 2019-05-14 DIAGNOSIS — D509 Iron deficiency anemia, unspecified: Secondary | ICD-10-CM | POA: Diagnosis not present

## 2019-05-14 DIAGNOSIS — R7303 Prediabetes: Secondary | ICD-10-CM | POA: Diagnosis not present

## 2019-05-14 DIAGNOSIS — Z0000 Encounter for general adult medical examination without abnormal findings: Secondary | ICD-10-CM | POA: Diagnosis not present

## 2019-05-14 DIAGNOSIS — E538 Deficiency of other specified B group vitamins: Secondary | ICD-10-CM

## 2019-05-14 LAB — CBC WITH DIFFERENTIAL/PLATELET
Basophils Absolute: 0 10*3/uL (ref 0.0–0.1)
Basophils Relative: 0.7 % (ref 0.0–3.0)
Eosinophils Absolute: 0.1 10*3/uL (ref 0.0–0.7)
Eosinophils Relative: 1.4 % (ref 0.0–5.0)
HCT: 46.3 % (ref 39.0–52.0)
Hemoglobin: 15.7 g/dL (ref 13.0–17.0)
Lymphocytes Relative: 19.9 % (ref 12.0–46.0)
Lymphs Abs: 1.5 10*3/uL (ref 0.7–4.0)
MCHC: 33.8 g/dL (ref 30.0–36.0)
MCV: 87.4 fl (ref 78.0–100.0)
Monocytes Absolute: 0.7 10*3/uL (ref 0.1–1.0)
Monocytes Relative: 9.1 % (ref 3.0–12.0)
Neutro Abs: 5.1 10*3/uL (ref 1.4–7.7)
Neutrophils Relative %: 68.9 % (ref 43.0–77.0)
Platelets: 157 10*3/uL (ref 150.0–400.0)
RBC: 5.29 Mil/uL (ref 4.22–5.81)
RDW: 13 % (ref 11.5–15.5)
WBC: 7.5 10*3/uL (ref 4.0–10.5)

## 2019-05-14 LAB — FERRITIN: Ferritin: 70.9 ng/mL (ref 22.0–322.0)

## 2019-05-14 LAB — HEMOGLOBIN A1C: Hgb A1c MFr Bld: 5.6 % (ref 4.6–6.5)

## 2019-05-14 LAB — LIPID PANEL
Cholesterol: 125 mg/dL (ref 0–200)
HDL: 44.6 mg/dL (ref >39.00)
LDL Cholesterol: 54 mg/dL (ref 0–99)
NonHDL: 80.62
Total CHOL/HDL Ratio: 3
Triglycerides: 134 mg/dL (ref 0.0–149.0)
VLDL: 26.8 mg/dL (ref 0.0–40.0)

## 2019-05-14 LAB — COMPREHENSIVE METABOLIC PANEL
ALT: 24 U/L (ref 0–53)
AST: 18 U/L (ref 0–37)
Albumin: 4.4 g/dL (ref 3.5–5.2)
Alkaline Phosphatase: 119 U/L — ABNORMAL HIGH (ref 39–117)
BUN: 12 mg/dL (ref 6–23)
CO2: 29 mEq/L (ref 19–32)
Calcium: 9.3 mg/dL (ref 8.4–10.5)
Chloride: 103 mEq/L (ref 96–112)
Creatinine, Ser: 0.94 mg/dL (ref 0.40–1.50)
GFR: 94.45 mL/min (ref >60.00)
Glucose, Bld: 93 mg/dL (ref 70–99)
Potassium: 4.1 mEq/L (ref 3.5–5.1)
Sodium: 140 mEq/L (ref 135–145)
Total Bilirubin: 1.1 mg/dL (ref 0.2–1.2)
Total Protein: 6.9 g/dL (ref 6.0–8.3)

## 2019-05-14 LAB — VITAMIN B12: Vitamin B-12: 852 pg/mL (ref 211–911)

## 2019-05-14 LAB — TSH: TSH: 0.76 u[IU]/mL (ref 0.35–4.50)

## 2019-05-14 NOTE — Patient Instructions (Signed)
[name] , Thank you for taking time to come for your Medicare Wellness Visit. I appreciate your ongoing commitment to your health goals. Please review the following plan we discussed and let me know if I can assist you in the future.   Screening recommendations/referrals: Colonoscopy: Up to date, completed 02/15/2016 Recommended yearly ophthalmology/optometry visit for glaucoma screening and checkup Recommended yearly dental visit for hygiene and checkup  Vaccinations: Influenza vaccine: Up to date, completed 01/01/2019 Pneumococcal vaccine: Completed series Tdap vaccine: decline Shingles vaccine: discussed    Advanced directives: Advance directive discussed with you today. Even though you declined this today please call our office should you change your mind and we can give you the proper paperwork for you to fill out.  Conditions/risks identified: hypertension, hyperlipidemia  Next appointment: 05/20/2019 @ 8:30 am   Preventive Care 65 Years and Older, Male Preventive care refers to lifestyle choices and visits with your health care provider that can promote health and wellness. What does preventive care include?  A yearly physical exam. This is also called an annual well check.  Dental exams once or twice a year.  Routine eye exams. Ask your health care provider how often you should have your eyes checked.  Personal lifestyle choices, including:  Daily care of your teeth and gums.  Regular physical activity.  Eating a healthy diet.  Avoiding tobacco and drug use.  Limiting alcohol use.  Practicing safe sex.  Taking low doses of aspirin every day.  Taking vitamin and mineral supplements as recommended by your health care provider. What happens during an annual well check? The services and screenings done by your health care provider during your annual well check will depend on your age, overall health, lifestyle risk factors, and family history of disease. Counseling   Your health care provider may ask you questions about your:  Alcohol use.  Tobacco use.  Drug use.  Emotional well-being.  Home and relationship well-being.  Sexual activity.  Eating habits.  History of falls.  Memory and ability to understand (cognition).  Work and work Statistician. Screening  You may have the following tests or measurements:  Height, weight, and BMI.  Blood pressure.  Lipid and cholesterol levels. These may be checked every 5 years, or more frequently if you are over 10 years old.  Skin check.  Lung cancer screening. You may have this screening every year starting at age 76 if you have a 30-pack-year history of smoking and currently smoke or have quit within the past 15 years.  Fecal occult blood test (FOBT) of the stool. You may have this test every year starting at age 29.  Flexible sigmoidoscopy or colonoscopy. You may have a sigmoidoscopy every 5 years or a colonoscopy every 10 years starting at age 62.  Prostate cancer screening. Recommendations will vary depending on your family history and other risks.  Hepatitis C blood test.  Hepatitis B blood test.  Sexually transmitted disease (STD) testing.  Diabetes screening. This is done by checking your blood sugar (glucose) after you have not eaten for a while (fasting). You may have this done every 1-3 years.  Abdominal aortic aneurysm (AAA) screening. You may need this if you are a current or former smoker.  Osteoporosis. You may be screened starting at age 16 if you are at high risk. Talk with your health care provider about your test results, treatment options, and if necessary, the need for more tests. Vaccines  Your health care provider may recommend certain  vaccines, such as:  Influenza vaccine. This is recommended every year.  Tetanus, diphtheria, and acellular pertussis (Tdap, Td) vaccine. You may need a Td booster every 10 years.  Zoster vaccine. You may need this after age  59.  Pneumococcal 13-valent conjugate (PCV13) vaccine. One dose is recommended after age 59.  Pneumococcal polysaccharide (PPSV23) vaccine. One dose is recommended after age 21. Talk to your health care provider about which screenings and vaccines you need and how often you need them. This information is not intended to replace advice given to you by your health care provider. Make sure you discuss any questions you have with your health care provider. Document Released: 05/07/2015 Document Revised: 12/29/2015 Document Reviewed: 02/09/2015 Elsevier Interactive Patient Education  2017 [place] Prevention in the Home Falls can cause injuries. They can happen to people of all ages. There are many things you can do to make your home safe and to help prevent falls. What can I do on the outside of my home?  Regularly fix the edges of walkways and driveways and fix any cracks.  Remove anything that might make you trip as you walk through a door, such as a raised step or threshold.  Trim any bushes or trees on the path to your home.  Use bright outdoor lighting.  Clear any walking paths of anything that might make someone trip, such as rocks or tools.  Regularly check to see if handrails are loose or broken. Make sure that both sides of any steps have handrails.  Any raised decks and porches should have guardrails on the edges.  Have any leaves, snow, or ice cleared regularly.  Use sand or salt on walking paths during winter.  Clean up any spills in your garage right away. This includes oil or grease spills. What can I do in the bathroom?  Use night lights.  Install grab bars by the toilet and in the tub and shower. Do not use towel bars as grab bars.  Use non-skid mats or decals in the tub or shower.  If you need to sit down in the shower, use a plastic, non-slip stool.  Keep the floor dry. Clean up any water that spills on the floor as soon as it happens.  Remove  soap buildup in the tub or shower regularly.  Attach bath mats securely with double-sided non-slip rug tape.  Do not have throw rugs and other things on the floor that can make you trip. What can I do in the bedroom?  Use night lights.  Make sure that you have a light by your bed that is easy to reach.  Do not use any sheets or blankets that are too big for your bed. They should not hang down onto the floor.  Have a firm chair that has side arms. You can use this for support while you get dressed.  Do not have throw rugs and other things on the floor that can make you trip. What can I do in the kitchen?  Clean up any spills right away.  Avoid walking on wet floors.  Keep items that you use a lot in easy-to-reach places.  If you need to reach something above you, use a strong step stool that has a grab bar.  Keep electrical cords out of the way.  Do not use floor polish or wax that makes floors slippery. If you must use wax, use non-skid floor wax.  Do not have throw rugs and other things  on the floor that can make you trip. What can I do with my stairs?  Do not leave any items on the stairs.  Make sure that there are handrails on both sides of the stairs and use them. Fix handrails that are broken or loose. Make sure that handrails are as long as the stairways.  Check any carpeting to make sure that it is firmly attached to the stairs. Fix any carpet that is loose or worn.  Avoid having throw rugs at the top or bottom of the stairs. If you do have throw rugs, attach them to the floor with carpet tape.  Make sure that you have a light switch at the top of the stairs and the bottom of the stairs. If you do not have them, ask someone to add them for you. What else can I do to help prevent falls?  Wear shoes that:  Do not have high heels.  Have rubber bottoms.  Are comfortable and fit you well.  Are closed at the toe. Do not wear sandals.  If you use a  stepladder:  Make sure that it is fully opened. Do not climb a closed stepladder.  Make sure that both sides of the stepladder are locked into place.  Ask someone to hold it for you, if possible.  Clearly mark and make sure that you can see:  Any grab bars or handrails.  First and last steps.  Where the edge of each step is.  Use tools that help you move around (mobility aids) if they are needed. These include:  Canes.  Walkers.  Scooters.  Crutches.  Turn on the lights when you go into a dark area. Replace any light bulbs as soon as they burn out.  Set up your furniture so you have a clear path. Avoid moving your furniture around.  If any of your floors are uneven, fix them.  If there are any pets around you, be aware of where they are.  Review your medicines with your doctor. Some medicines can make you feel dizzy. This can increase your chance of falling. Ask your doctor what other things that you can do to help prevent falls. This information is not intended to replace advice given to you by your health care provider. Make sure you discuss any questions you have with your health care provider. Document Released: 02/04/2009 Document Revised: 09/16/2015 Document Reviewed: 05/15/2014 Elsevier Interactive Patient Education  2017 [employer].

## 2019-05-14 NOTE — Progress Notes (Signed)
Subjective:   Jerry Farrell is a 74 y.o. male who presents for Medicare Annual/Subsequent preventive examination.  Review of Systems: N/A   This visit is being conducted through telemedicine via telephone at the nurse health advisor's home address due to the COVID-19 pandemic. This patient has given me verbal consent via doximity to conduct this visit, patient states they are participating from their home address. Patient and myself are on the telephone call. There is no referral for this visit. Some vital signs may be absent or patient reported.    Patient identification: identified by name, DOB, and current address   Cardiac Risk Factors include: advanced age (>44men, >70 women);hypertension;dyslipidemia;male gender     Objective:    Vitals: There were no vitals taken for this visit.  There is no height or weight on file to calculate BMI.  Advanced Directives 05/14/2019 05/08/2018 06/06/2016 06/06/2016 04/09/2016 01/26/2016 03/06/2012  Does Patient Have a Medical Advance Directive? No No No No No No Patient would not like information  Would patient like information on creating a medical advance directive? No - Patient declined No - Patient declined No - Patient declined - - No - patient declined information -    Tobacco Social History   Tobacco Use  Smoking Status Never Smoker  Smokeless Tobacco Never Used     Counseling given: Not Answered   Clinical Intake:  Pre-visit preparation completed: Yes  Pain : No/denies pain     Nutritional Risks: None Diabetes: No  How often do you need to have someone help you when you read instructions, pamphlets, or other written materials from your doctor or pharmacy?: 1 - Never What is the last grade level you completed in school?: 12th  Interpreter Needed?: No  Information entered by :: CJohnson, LPN  Past Medical History:  Diagnosis Date  . BPH (benign prostatic hyperplasia)   . CAD (coronary artery disease)    cardiolite ok  3/05, stress test- low risk study 11/06  . Chronic low back pain    with radiculopathy  . ED (erectile dysfunction)   . Gallstones    abd [place]- stable hamangioma, gallbladder sludge and tiny stones 09/2003  . GERD (gastroesophageal reflux disease)   . Hyperlipidemia   . Hypertension   . Transfusion history    Past Surgical History:  Procedure Laterality Date  . CATARACT EXTRACTION W/PHACO  03/13/2012   Procedure: CATARACT EXTRACTION PHACO AND INTRAOCULAR LENS PLACEMENT (IOC);  Surgeon: Adonis Brook, MD;  Location: [facility];  Service: Ophthalmology;  Laterality: Left;  . CORONARY ARTERY BYPASS GRAFT  1998   x 1  [facility]  . ESOPHAGOGASTRODUODENOSCOPY (EGD) WITH PROPOFOL N/A 06/07/2016   Procedure: ESOPHAGOGASTRODUODENOSCOPY (EGD) WITH PROPOFOL;  Surgeon: Mauri Pole, MD;  Location: [facility];  Service: Endoscopy;  Laterality: N/A;  . GASTRECTOMY     partial  . MEMBRANE PEEL  03/13/2012   Procedure: MEMBRANE PEEL;  Surgeon: Adonis Brook, MD;  Location: [facility];  Service: Ophthalmology;  Laterality: Left;  . PARS PLANA VITRECTOMY  03/13/2012   Procedure: PARS PLANA VITRECTOMY WITH 23 GAUGE;  Surgeon: Adonis Brook, MD;  Location: [facility];  Service: Ophthalmology;  Laterality: Left;   Family History  Problem Relation Age of Onset  . Cancer Sister        unknown type, pt feels alcohol related  . Colon cancer Brother   . Throat cancer Brother        pt feels alcohol related  . Colon  cancer Brother        1/2 brother  . Hypertension Mother   . Stomach cancer Neg Hx   . Pancreatic cancer Neg Hx    Social History   Socioeconomic History  . Marital status: Married    Spouse name: Not on file  . Number of children: 3  . Years of education: Not on file  . Highest education level: Not on file  Occupational History  . Occupation: [employer]    Employer: RETIRED  Tobacco Use  . Smoking status: Never Smoker  . Smokeless tobacco: Never Used  Substance and Sexual Activity  .  Alcohol use: No    Alcohol/week: 0.0 standard drinks  . Drug use: No  . Sexual activity: Yes  Other Topics Concern  . Not on file  Social History Narrative  . Not on file   Social Determinants of Health   Financial Resource Strain: Low Risk   . Difficulty of Paying Living Expenses: Not hard at all  Food Insecurity: No Food Insecurity  . Worried About Charity fundraiser in the Last Year: Never true  . Ran Out of Food in the Last Year: Never true  Transportation Needs: No Transportation Needs  . Lack of Transportation (Medical): No  . Lack of Transportation (Non-Medical): No  Physical Activity: Inactive  . Days of Exercise per Week: 0 days  . Minutes of Exercise per Session: 0 min  Stress: No Stress Concern Present  . Feeling of Stress : Not at all  Social Connections:   . Frequency of Communication with Friends and Family: Not on file  . Frequency of Social Gatherings with Friends and Family: Not on file  . Attends Religious Services: Not on file  . Active Member of Clubs or Organizations: Not on file  . Attends Archivist Meetings: Not on file  . Marital Status: Not on file    Outpatient Encounter Medications as of 05/14/2019  Medication Sig  . aspirin EC 81 MG tablet Take 1 tablet (81 mg total) by mouth daily.  Marland Kitchen atorvastatin (LIPITOR) 10 MG tablet Take 1 tablet (10 mg total) by mouth daily.  . carvedilol (COREG) 3.125 MG tablet Take 1 tablet (3.125 mg total) by mouth 2 (two) times daily with a meal.  . dorzolamide-timolol (COSOPT) 22.3-6.8 MG/ML ophthalmic solution Place 1 drop into both eyes 2 (two) times daily. Use as directed   . doxazosin (CARDURA) 2 MG tablet Take 1 tablet (2 mg total) by mouth daily.  . pantoprazole (PROTONIX) 40 MG tablet TAKE 1 TABLET BY MOUTH EVERY DAY  . tamsulosin (FLOMAX) 0.4 MG CAPS capsule Take 1 capsules by mouth once daily  . valsartan (DIOVAN) 320 MG tablet Take 1 tablet (320 mg total) by mouth daily.  . [DISCONTINUED]  amLODipine (NORVASC) 5 MG tablet Take 1 tablet (5 mg total) by mouth daily.   No facility-administered encounter medications on file as of 05/14/2019.    Activities of Daily Living In your present state of health, do you have any difficulty performing the following activities: 05/14/2019  Hearing? Y  Comment some hearing loss  Vision? N  Difficulty concentrating or making decisions? N  Walking or climbing stairs? N  Dressing or bathing? N  Doing errands, shopping? N  Preparing Food and eating ? N  Using the Toilet? N  In the past six months, have you accidently leaked urine? N  Do you have problems with loss of bowel control? N  Managing your Medications? N  Managing your Finances? N  Housekeeping or managing your Housekeeping? N  Some recent data might be hidden    Patient Care Team: Tower, Wynelle Fanny, MD as PCP - General Gwenlyn Found Pearletha Forge, MD as PCP - Cardiology (Cardiology) Lorretta Harp, MD as Consulting Physician (Cardiology) Camillo Flaming, OD as Consulting Physician (Optometry)   Assessment:   This is a routine wellness examination for [facility].  Exercise Activities and Dietary recommendations Current Exercise Habits: The patient does not participate in regular exercise at present, Exercise limited by: None identified  Goals    . Increase physical activity     Starting 05/08/2018, I will continue to bowl for at least 2 hours one day per week.     . Patient Stated     05/14/2019, I will maintain and continue medications as prescribed.        Fall Risk Fall Risk  05/14/2019 05/08/2018 03/26/2017 01/26/2016 10/28/2014  Falls in the past year? 0 0 No No No  Number falls in past yr: 0 - - - -  Injury with Fall? 0 - - - -  Risk for fall due to : Medication side effect - - - -  Follow up Falls evaluation completed;Falls prevention discussed - - - -   Is the patient's home free of loose throw rugs in walkways, pet beds, electrical cords, etc?   yes      Grab bars in the  bathroom? no      Handrails on the stairs?   yes      Adequate lighting?   yes  Timed Get Up and Go Performed: N/A  Depression Screen PHQ 2/9 Scores 05/14/2019 05/08/2018 03/26/2017 01/26/2016  PHQ - 2 Score 0 0 0 0  PHQ- 9 Score 0 0 - -    Cognitive Function MMSE - Mini Mental State Exam 05/14/2019 05/08/2018 01/26/2016  Orientation to time 5 5 5   Orientation to Place 5 5 5   Registration 3 3 3   Attention/ Calculation 5 0 0  Recall 2 3 3   Language- name 2 objects - 0 0  Language- repeat 1 1 1   Language- follow 3 step command - 3 3  Language- read & follow direction - 0 0  Write a sentence - 0 0  Copy design - 0 0  Total score - 20 20  Mini Cog  Mini-Cog screen was completed. Maximum score is 22. A value of 0 denotes this part of the MMSE was not completed or the patient failed this part of the Mini-Cog screening.       Immunization History  Administered Date(s) Administered  . Fluad Quad(high Dose 65+) 01/01/2019  . Influenza Split 03/17/2011, 04/30/2012  . Influenza, High Dose Seasonal PF 02/20/2018  . Influenza,inj,Quad PF,6+ Mos 04/08/2013, 05/28/2015, 01/26/2016, 03/26/2017  . Influenza-Unspecified 02/22/2014  . Pneumococcal Conjugate-13 10/28/2014  . Pneumococcal Polysaccharide-23 04/30/2012  . Td 08/22/1997, 11/26/2007  . Zoster 08/19/2013    Qualifies for Shingles Vaccine? Yes  Screening Tests Health Maintenance  Topic Date Due  . TETANUS/TDAP  04/23/2020 (Originally 11/25/2017)  . COLONOSCOPY  02/14/2021  . INFLUENZA VACCINE  Completed  . Hepatitis C Screening  Completed  . PNA vac Low Risk Adult  Completed   Cancer Screenings: Lung: Low Dose CT Chest recommended if Age 36-80 years, 30 pack-year currently smoking OR have quit w/in 15years. Patient does not qualify. Colorectal: completed 02/15/2016  Additional Screenings:  Hepatitis C Screening: 01/26/2016      Plan:    Patient  will maintain and continue medications as prescribed.  I have personally  reviewed and noted the following in the patient's chart:   . Medical and social history . Use of alcohol, tobacco or illicit drugs  . Current medications and supplements . Functional ability and status . Nutritional status . Physical activity . Advanced directives . List of other physicians . Hospitalizations, surgeries, and ER visits in previous 12 months . Vitals . Screenings to include cognitive, depression, and falls . Referrals and appointments  In addition, I have reviewed and discussed with patient certain preventive protocols, quality metrics, and best practice recommendations. A written personalized care plan for preventive services as well as general preventive health recommendations were provided to patient.     Andrez Grime, LPN  [id number]

## 2019-05-14 NOTE — Progress Notes (Signed)
PCP notes:  Health Maintenance: No gaps noted   Abnormal Screenings: none   Patient concerns: Swelling in his lower legs and ankles   Nurse concerns: none   Next PCP appt.: 05/20/2019 @ 8:30 am

## 2019-05-18 ENCOUNTER — Other Ambulatory Visit: Payer: Self-pay | Admitting: Family Medicine

## 2019-05-20 ENCOUNTER — Encounter: Payer: Self-pay | Admitting: Family Medicine

## 2019-05-20 ENCOUNTER — Other Ambulatory Visit: Payer: Self-pay

## 2019-05-20 ENCOUNTER — Ambulatory Visit: Payer: Medicare Other

## 2019-05-20 ENCOUNTER — Ambulatory Visit (INDEPENDENT_AMBULATORY_CARE_PROVIDER_SITE_OTHER): Payer: Medicare Other | Admitting: Family Medicine

## 2019-05-20 VITALS — BP 134/80 | HR 76 | Temp 97.5°F | Ht 67.5 in | Wt 185.2 lb

## 2019-05-20 DIAGNOSIS — Z0000 Encounter for general adult medical examination without abnormal findings: Secondary | ICD-10-CM | POA: Diagnosis not present

## 2019-05-20 DIAGNOSIS — R748 Abnormal levels of other serum enzymes: Secondary | ICD-10-CM

## 2019-05-20 DIAGNOSIS — E538 Deficiency of other specified B group vitamins: Secondary | ICD-10-CM

## 2019-05-20 DIAGNOSIS — K2100 Gastro-esophageal reflux disease with esophagitis, without bleeding: Secondary | ICD-10-CM

## 2019-05-20 DIAGNOSIS — R7303 Prediabetes: Secondary | ICD-10-CM

## 2019-05-20 DIAGNOSIS — D509 Iron deficiency anemia, unspecified: Secondary | ICD-10-CM

## 2019-05-20 DIAGNOSIS — E785 Hyperlipidemia, unspecified: Secondary | ICD-10-CM | POA: Diagnosis not present

## 2019-05-20 DIAGNOSIS — N400 Enlarged prostate without lower urinary tract symptoms: Secondary | ICD-10-CM | POA: Diagnosis not present

## 2019-05-20 DIAGNOSIS — I2510 Atherosclerotic heart disease of native coronary artery without angina pectoris: Secondary | ICD-10-CM | POA: Insufficient documentation

## 2019-05-20 DIAGNOSIS — I10 Essential (primary) hypertension: Secondary | ICD-10-CM

## 2019-05-20 DIAGNOSIS — I25708 Atherosclerosis of coronary artery bypass graft(s), unspecified, with other forms of angina pectoris: Secondary | ICD-10-CM

## 2019-05-20 MED ORDER — TAMSULOSIN HCL 0.4 MG PO CAPS: ORAL_CAPSULE | ORAL | 3 refills | Status: DC

## 2019-05-20 NOTE — Assessment & Plan Note (Signed)
Cbc is stable as is ferritin  No longer on iron  No s/s of GI bleed

## 2019-05-20 NOTE — Patient Instructions (Addendum)
Please start exercising more at home   (walk at least 30 minutes)  Also try the back stretches I printed out  It will help blood pressure and help you feel better  A yoga video would also be a good idea   Let Dr Gwenlyn Found know about the fatigue/palpitations and the blood pressure   Take care of yourself  Blood pressure is better on the 2nd check   I sent your prostate medicine into the pharmacy

## 2019-05-20 NOTE — Assessment & Plan Note (Signed)
Much improved with daily protonix No longer GI bleeding or epigastric pain

## 2019-05-20 NOTE — Assessment & Plan Note (Signed)
Reviewed health habits including diet and exercise and skin cancer prevention Reviewed appropriate screening tests for age  Also reviewed health mt list, fam hx and immunization status , as well as social and family history   See HPI Reviewed amw  Rev labs  Disc imp of exercise for better health and balance  bp improved with inc in amlodipine  Encouraged pt to get 2nd covid vaccine as planned

## 2019-05-20 NOTE — Assessment & Plan Note (Signed)
Pt sees urology  flomax helps voiding symptoms  Last psa 2.73 in august For urology f/u soon- they monitor closely

## 2019-05-20 NOTE — Assessment & Plan Note (Signed)
Pt continues f/u with cardiology along with bp and cholesterol control

## 2019-05-20 NOTE — Assessment & Plan Note (Signed)
Lab Results  Component Value Date   [id number] 05/14/2019   Absorbing better now-no longer supplementing

## 2019-05-20 NOTE — Assessment & Plan Note (Signed)
Lab Results  Component Value Date   HGBA1C 5.6 05/14/2019   disc imp of low glycemic diet and wt loss to prevent DM2

## 2019-05-20 NOTE — Assessment & Plan Note (Signed)
Disc goals for lipids and reasons to control them Rev last labs with pt Rev low sat fat diet in detail  Good control with atorvastatin in setting of CAD LDL is 54

## 2019-05-20 NOTE — Assessment & Plan Note (Signed)
bp in fair control at this time  BP Readings from Last 1 Encounters:  05/20/19 134/80  better after recent inc of amlodipine to 10 mg  Some periph edema but tolerable For cardiology f/u soon No changes needed Most recent labs reviewed  Disc lifstyle change with low sodium diet and exercise

## 2019-05-20 NOTE — Assessment & Plan Note (Signed)
No changes

## 2019-05-20 NOTE — Progress Notes (Signed)
Subjective:    Patient ID: Jerry Farrell, male    DOB: 06/24/1945, 74 y.o.   MRN: [id number]  This visit occurred during the SARS-CoV-2 public health emergency.  Safety protocols were in place, including screening questions prior to the visit, additional usage of staff PPE, and extensive cleaning of exam room while observing appropriate contact time as indicated for disinfecting solutions.    HPI  Here for health maintenance exam and to review chronic medical problems    Wt Readings from Last 3 Encounters:  05/20/19 185 lb 3 oz (84 kg)  05/06/19 189 lb (85.7 kg)  03/11/19 184 lb 9.6 oz (83.7 kg)  staying at home and eating -during the pandemic  Walks a little for exercise (about 5-10 minutes) 28.58 kg/m   Some pain in back/worse to extend his arms  Also off balance  occ palpitations Tires easily   Had amw on 1/20 No gaps  Colonoscopy 10/17 with 5 y rec His brother had colon cancer   Prostate health  Taking flomax for BPH-it still helps  Seen by urology- 8/20 last visit and PSA was 2.73 Lab Results  Component Value Date   PSA 3.19 05/08/2018   PSA 2.71 03/26/2017   PSA 1.61 01/26/2016    zostavax 4/15  Had his first covid vaccine  2nd one is planned  He is trying to convince friends to get theirs (05/15/19)   bp is elevated today on first check  Better 2nd check BP: 134/80   Cardiology had him increase amlodipine from 5 to 10 mg on 05/05/18 Now he is having swelling in ankles - itchy/burning  Notes scheduled to see Dr Gwenlyn Found next month  No cp or palpitations or headaches or edema  No side effects to medicines  BP Readings from Last 3 Encounters:  05/20/19 (!) 162/84  05/06/19 (!) 146/68  03/11/19 (!) 152/76    Pt states in am- his bp is much improved (120s), but goes up later in the day On 2nd check here BP: 134/80   Pulse Readings from Last 3 Encounters:  05/20/19 76  05/06/19 72  03/11/19 64    Taking diovan 320 corvedilol 3.125 Doxazosin 2 mg     Hyperlipidemia Lab Results  Component Value Date   CHOL 125 05/14/2019   CHOL 176 05/08/2018   CHOL 142 03/26/2017   Lab Results  Component Value Date   HDL 44.60 05/14/2019   HDL 47.80 05/08/2018   HDL 51.00 03/26/2017   Lab Results  Component Value Date   LDLCALC 54 05/14/2019   LDLCALC 67 03/26/2017   LDLCALC 67 01/26/2016   Lab Results  Component Value Date   TRIG 134.0 05/14/2019   TRIG 219.0 (H) 05/08/2018   TRIG 116.0 03/26/2017   Lab Results  Component Value Date   CHOLHDL 3 05/14/2019   CHOLHDL 4 05/08/2018   CHOLHDL 3 03/26/2017   Lab Results  Component Value Date   LDLDIRECT 80.0 05/08/2018   Atorvastatin and diet  LDL is at goal  More baked food and less fried   Prediabetes Lab Results  Component Value Date   HGBA1C 5.6 05/14/2019   Down from 5.8  H/o iron def anemia and low B12  Lab Results  Component Value Date   WBC 7.5 05/14/2019   HGB 15.7 05/14/2019   HCT 46.3 05/14/2019   MCV 87.4 05/14/2019   PLT 157.0 05/14/2019    Lab Results  Component Value Date   FERRITIN 70.9 05/14/2019  No longer taking iron   Lab Results  Component Value Date   VITAMINB12 852 05/14/2019  no longer taking B12   No GI symptoms at all No more GI bleeding Takes pantoprazole  Has gas occasionally    Chronic alk phos elevation Lab Results  Component Value Date   ALT 24 05/14/2019   AST 18 05/14/2019   ALKPHOS 119 (H) 05/14/2019   BILITOT 1.1 05/14/2019   stable   Lab Results  Component Value Date   CREATININE 0.94 05/14/2019   BUN 12 05/14/2019   NA 140 05/14/2019   K 4.1 05/14/2019   CL 103 05/14/2019   CO2 29 05/14/2019   Lab Results  Component Value Date   TSH 0.76 05/14/2019     Patient Active Problem List   Diagnosis Date Noted  . Coronary artery disease 05/20/2019  . Constipation 08/30/2018  . Fatigue 08/30/2018  . Hyponatremia 08/30/2018  . Dizziness 08/26/2018  . Peptic ulcer disease 06/14/2016  . Iron  deficiency anemia 06/14/2016  . B12 deficiency 06/14/2016  . Prediabetes 01/31/2016  . Need for hepatitis C screening test 01/25/2016  . Post-nasal drip 12/10/2015  . GERD (gastroesophageal reflux disease) 07/20/2015  . Routine general medical examination at a health care facility 10/28/2014  . Elevated alkaline phosphatase level 10/28/2014  . BPH (benign prostatic hyperplasia) 04/29/2014  . Prostate cancer screening 08/11/2013  . Encounter for Medicare annual wellness exam 08/11/2013  . Dyslipidemia, goal LDL below 70 01/09/2007  . ERECTILE DYSFUNCTION 01/09/2007  . Essential hypertension 01/09/2007  . Hx of CABG 01/09/2007  . ESOPHAGITIS 01/09/2007   Past Medical History:  Diagnosis Date  . BPH (benign prostatic hyperplasia)   . CAD (coronary artery disease)    cardiolite ok 3/05, stress test- low risk study 11/06  . Chronic low back pain    with radiculopathy  . ED (erectile dysfunction)   . Gallstones    abd [place]- stable hamangioma, gallbladder sludge and tiny stones 09/2003  . GERD (gastroesophageal reflux disease)   . Hyperlipidemia   . Hypertension   . Transfusion history    Past Surgical History:  Procedure Laterality Date  . CATARACT EXTRACTION W/PHACO  03/13/2012   Procedure: CATARACT EXTRACTION PHACO AND INTRAOCULAR LENS PLACEMENT (IOC);  Surgeon: Adonis Brook, MD;  Location: [facility];  Service: Ophthalmology;  Laterality: Left;  . CORONARY ARTERY BYPASS GRAFT  1998   x 1  done here [facility]  . ESOPHAGOGASTRODUODENOSCOPY (EGD) WITH PROPOFOL N/A 06/07/2016   Procedure: ESOPHAGOGASTRODUODENOSCOPY (EGD) WITH PROPOFOL;  Surgeon: Mauri Pole, MD;  Location: [facility];  Service: Endoscopy;  Laterality: N/A;  . GASTRECTOMY     partial  . MEMBRANE PEEL  03/13/2012   Procedure: MEMBRANE PEEL;  Surgeon: Adonis Brook, MD;  Location: [facility];  Service: Ophthalmology;  Laterality: Left;  . PARS PLANA VITRECTOMY  03/13/2012   Procedure: PARS PLANA VITRECTOMY WITH 23 GAUGE;   Surgeon: Adonis Brook, MD;  Location: [facility];  Service: Ophthalmology;  Laterality: Left;   Social History   Tobacco Use  . Smoking status: Never Smoker  . Smokeless tobacco: Never Used  Substance Use Topics  . Alcohol use: No    Alcohol/week: 0.0 standard drinks  . Drug use: No   Family History  Problem Relation Age of Onset  . Cancer Sister        unknown type, pt feels alcohol related  . Colon cancer Brother   . Throat cancer Brother  pt feels alcohol related  . Colon cancer Brother        1/2 brother  . Hypertension Mother   . Stomach cancer Neg Hx   . Pancreatic cancer Neg Hx    No Known Allergies Current Outpatient Medications on File Prior to Visit  Medication Sig Dispense Refill  . amLODipine (NORVASC) 5 MG tablet Take 5 mg by mouth daily.    Marland Kitchen aspirin EC 81 MG tablet Take 1 tablet (81 mg total) by mouth daily. 90 tablet 3  . atorvastatin (LIPITOR) 10 MG tablet Take 1 tablet (10 mg total) by mouth daily. 90 tablet 3  . carvedilol (COREG) 3.125 MG tablet Take 1 tablet (3.125 mg total) by mouth 2 (two) times daily with a meal. 180 tablet 3  . doxazosin (CARDURA) 2 MG tablet Take 1 tablet (2 mg total) by mouth daily. 90 tablet 3  . pantoprazole (PROTONIX) 40 MG tablet TAKE 1 TABLET BY MOUTH EVERY DAY 90 tablet 0  . valsartan (DIOVAN) 320 MG tablet Take 1 tablet (320 mg total) by mouth daily. 90 tablet 1   No current facility-administered medications on file prior to visit.    Review of Systems  Constitutional: Negative for activity change, appetite change, fatigue, fever and unexpected weight change.  HENT: Negative for congestion, rhinorrhea, sore throat and trouble swallowing.   Eyes: Negative for pain, redness, itching and visual disturbance.  Respiratory: Negative for cough, chest tightness, shortness of breath and wheezing.   Cardiovascular: Negative for chest pain and palpitations.  Gastrointestinal: Negative for abdominal pain, blood in stool,  constipation, diarrhea and nausea.  Endocrine: Negative for cold intolerance, heat intolerance, polydipsia and polyuria.  Genitourinary: Negative for difficulty urinating, dysuria, frequency and urgency.  Musculoskeletal: Positive for back pain. Negative for arthralgias, joint swelling and myalgias.  Skin: Negative for pallor and rash.  Neurological: Negative for dizziness, tremors, weakness, numbness and headaches.       Poor balance   Hematological: Negative for adenopathy. Does not bruise/bleed easily.  Psychiatric/Behavioral: Negative for decreased concentration and dysphoric mood. The patient is not nervous/anxious.        Objective:   Physical Exam Constitutional:      General: He is not in acute distress.    Appearance: Normal appearance. He is well-developed and normal weight. He is not ill-appearing or diaphoretic.  HENT:     Head: Normocephalic and atraumatic.     Right Ear: Tympanic membrane, ear canal and external ear normal.     Left Ear: Tympanic membrane, ear canal and external ear normal.     Nose: Nose normal. No congestion.     Mouth/Throat:     Mouth: Mucous membranes are moist.     Pharynx: Oropharynx is clear. No posterior oropharyngeal erythema.  Eyes:     General: No scleral icterus.       Right eye: No discharge.        Left eye: No discharge.     Conjunctiva/sclera: Conjunctivae normal.     Pupils: Pupils are equal, round, and reactive to light.  Neck:     Thyroid: No thyromegaly.     Vascular: No carotid bruit or JVD.  Cardiovascular:     Rate and Rhythm: Normal rate and regular rhythm.     Pulses: Normal pulses.     Heart sounds: Normal heart sounds. No gallop.   Pulmonary:     Effort: Pulmonary effort is normal. No respiratory distress.     Breath sounds: Normal breath  sounds. No wheezing or rales.     Comments: Good air exch Chest:     Chest wall: No tenderness.  Abdominal:     General: Bowel sounds are normal. There is no distension or  abdominal bruit.     Palpations: Abdomen is soft. There is no mass.     Tenderness: There is no abdominal tenderness.     Hernia: No hernia is present.     Comments: Baseline vertical scar No tenderness  Musculoskeletal:        General: No tenderness.     Cervical back: Normal range of motion and neck supple. No rigidity. No muscular tenderness.     Right lower leg: No edema.     Left lower leg: No edema.     Comments: No kyphosis   Lymphadenopathy:     Cervical: No cervical adenopathy.  Skin:    General: Skin is warm and dry.     Coloration: Skin is not pale.     Findings: No erythema or rash.     Comments: Few skin tags and sks  Neurological:     Mental Status: He is alert.     Cranial Nerves: No cranial nerve deficit.     Motor: No abnormal muscle tone.     Coordination: Coordination normal.     Gait: Gait normal.     Deep Tendon Reflexes: Reflexes are normal and symmetric. Reflexes normal.  Psychiatric:        Mood and Affect: Mood normal.        Cognition and Memory: Cognition and memory normal.     Comments: Pleasant and talkative            Assessment & Plan:   Problem List Items Addressed This Visit      Cardiovascular and Mediastinum   Essential hypertension    bp in fair control at this time  BP Readings from Last 1 Encounters:  05/20/19 134/80  better after recent inc of amlodipine to 10 mg  Some periph edema but tolerable For cardiology f/u soon No changes needed Most recent labs reviewed  Disc lifstyle change with low sodium diet and exercise        Relevant Medications   amLODipine (NORVASC) 5 MG tablet   Coronary artery disease    Pt continues f/u with cardiology along with bp and cholesterol control      Relevant Medications   amLODipine (NORVASC) 5 MG tablet     Digestive   GERD (gastroesophageal reflux disease)    Much improved with daily protonix No longer GI bleeding or epigastric pain         Genitourinary   BPH (benign  prostatic hyperplasia)    Pt sees urology  flomax helps voiding symptoms  Last psa 2.73 in august For urology f/u soon- they monitor closely      Relevant Medications   tamsulosin (FLOMAX) 0.4 MG CAPS capsule     Other   Dyslipidemia, goal LDL below 70    Disc goals for lipids and reasons to control them Rev last labs with pt Rev low sat fat diet in detail  Good control with atorvastatin in setting of CAD LDL is 54        Relevant Medications   amLODipine (NORVASC) 5 MG tablet   Routine general medical examination at a health care facility - Primary    Reviewed health habits including diet and exercise and skin cancer prevention Reviewed appropriate screening tests for age  Also reviewed health mt list, fam hx and immunization status , as well as social and family history   See HPI Reviewed amw  Rev labs  Disc imp of exercise for better health and balance  bp improved with inc in amlodipine  Encouraged pt to get 2nd covid vaccine as planned       Elevated alkaline phosphatase level    No changes       Prediabetes    Lab Results  Component Value Date   HGBA1C 5.6 05/14/2019   disc imp of low glycemic diet and wt loss to prevent DM2       Iron deficiency anemia    Cbc is stable as is ferritin  No longer on iron  No s/s of GI bleed       B12 deficiency    Lab Results  Component Value Date   VITAMINB12 852 05/14/2019   Absorbing better now-no longer supplementing

## 2019-05-24 ENCOUNTER — Other Ambulatory Visit: Payer: Self-pay | Admitting: Family Medicine

## 2019-06-17 ENCOUNTER — Encounter: Payer: Self-pay | Admitting: Cardiovascular Disease

## 2019-06-17 ENCOUNTER — Other Ambulatory Visit: Payer: Self-pay

## 2019-06-17 ENCOUNTER — Ambulatory Visit: Payer: Medicare Other | Admitting: Cardiovascular Disease

## 2019-06-17 VITALS — BP 138/76 | HR 68 | Temp 98.3°F | Ht 68.0 in | Wt 185.0 lb

## 2019-06-17 DIAGNOSIS — I10 Essential (primary) hypertension: Secondary | ICD-10-CM

## 2019-06-17 DIAGNOSIS — R0989 Other specified symptoms and signs involving the circulatory and respiratory systems: Secondary | ICD-10-CM

## 2019-06-17 DIAGNOSIS — Z951 Presence of aortocoronary bypass graft: Secondary | ICD-10-CM

## 2019-06-17 NOTE — Assessment & Plan Note (Signed)
History of hyperlipidemia on statin therapy with lipid profile performed 05/14/2019 revealing total cholesterol 125, LDL of 54 and HDL 44

## 2019-06-17 NOTE — Progress Notes (Signed)
06/17/2019 Jerry Farrell   20-Apr-1946  [id number]  Primary Physician Tower, Wynelle Fanny, MD Primary Cardiologist: Lorretta Harp MD [name], [place]  HPI:  Jerry Farrell is a 74 y.o.   thin-appearing married African-American male father of 38, grandfather and 5 grandchildren who is referred back to me by Dr. Glori Bickers, his PCP, to be reestablished in my practice. I last saw him in the office 04/11/2017. His risk factors include treated hypertension and hyperlipidemia. There is is a family history of a brother who recently had bypass surgery. He has never smoked. Retired from [employer]. He had coronary artery bypass graftingX 1in 1994 for what sounds like left main disease. He denies chest pain or shortness of breath. He did see how main in the office 06/02/16 after having had a Myoview stress test 04/11/16 that was essentially normal with no evidence of ischemia.   Since I saw him 2 years ago he continues to do well.  He denies chest pain or shortness of breath.  His fasting lipid profile was excellent performed 05/14/2019 with a total cholesterol 125, LDL 54 and HDL 44.    Current Meds  Medication Sig  . amLODipine (NORVASC) 5 MG tablet Take 5 mg by mouth daily.  Marland Kitchen aspirin EC 81 MG tablet Take 1 tablet (81 mg total) by mouth daily.  Marland Kitchen atorvastatin (LIPITOR) 10 MG tablet Take 1 tablet (10 mg total) by mouth daily.  . carvedilol (COREG) 3.125 MG tablet Take 1 tablet (3.125 mg total) by mouth 2 (two) times daily with a meal.  . doxazosin (CARDURA) 2 MG tablet Take 1 tablet (2 mg total) by mouth daily.  . pantoprazole (PROTONIX) 40 MG tablet TAKE 1 TABLET BY MOUTH EVERY DAY  . tamsulosin (FLOMAX) 0.4 MG CAPS capsule Take 1 capsules by mouth once daily  . valsartan (DIOVAN) 320 MG tablet Take 1 tablet (320 mg total) by mouth daily.     No Known Allergies  Social History   Socioeconomic History  . Marital status: Married    Spouse name: Not on file  . Number of children: 3  .  Years of education: Not on file  . Highest education level: Not on file  Occupational History  . Occupation: [employer]    Employer: RETIRED  Tobacco Use  . Smoking status: Never Smoker  . Smokeless tobacco: Never Used  Substance and Sexual Activity  . Alcohol use: No    Alcohol/week: 0.0 standard drinks  . Drug use: No  . Sexual activity: Yes  Other Topics Concern  . Not on file  Social History Narrative  . Not on file   Social Determinants of Health   Financial Resource Strain: Low Risk   . Difficulty of Paying Living Expenses: Not hard at all  Food Insecurity: No Food Insecurity  . Worried About Charity fundraiser in the Last Year: Never true  . Ran Out of Food in the Last Year: Never true  Transportation Needs: No Transportation Needs  . Lack of Transportation (Medical): No  . Lack of Transportation (Non-Medical): No  Physical Activity: Inactive  . Days of Exercise per Week: 0 days  . Minutes of Exercise per Session: 0 min  Stress: No Stress Concern Present  . Feeling of Stress : Not at all  Social Connections:   . Frequency of Communication with Friends and Family: Not on file  . Frequency of Social Gatherings with Friends and Family: Not on file  .  Attends Religious Services: Not on file  . Active Member of Clubs or Organizations: Not on file  . Attends Archivist Meetings: Not on file  . Marital Status: Not on file  Intimate Partner Violence: Not At Risk  . Fear of Current or Ex-Partner: No  . Emotionally Abused: No  . Physically Abused: No  . Sexually Abused: No     Review of Systems: General: negative for chills, fever, night sweats or weight changes.  Cardiovascular: negative for chest pain, dyspnea on exertion, edema, orthopnea, palpitations, paroxysmal nocturnal dyspnea or shortness of breath Dermatological: negative for rash Respiratory: negative for cough or wheezing Urologic: negative for hematuria Abdominal: negative for nausea, vomiting,  diarrhea, bright red blood per rectum, melena, or hematemesis Neurologic: negative for visual changes, syncope, or dizziness All other systems reviewed and are otherwise negative except as noted above.    Blood pressure 138/76, pulse 68, temperature 98.3 F (36.8 C), height 5\' 8"  (1.727 m), weight 185 lb (83.9 kg).  General appearance: alert and no distress Neck: no adenopathy, no JVD, supple, symmetrical, trachea midline, thyroid not enlarged, symmetric, no tenderness/mass/nodules and Soft left carotid bruit Lungs: clear to auscultation bilaterally Heart: regular rate and rhythm, S1, S2 normal, no murmur, click, rub or gallop Extremities: extremities normal, atraumatic, no cyanosis or edema Pulses: 2+ and symmetric Skin: Skin color, texture, turgor normal. No rashes or lesions Neurologic: Alert and oriented X 3, normal strength and tone. Normal symmetric reflexes. Normal coordination and gait  EKG sinus rhythm at 68 with nonspecific ST and T wave changes.  I personally reviewed this EKG.  ASSESSMENT AND PLAN:   Dyslipidemia, goal LDL below 70 History of hyperlipidemia on statin therapy with lipid profile performed 05/14/2019 revealing total cholesterol 125, LDL of 54 and HDL 44  Essential hypertension History of essential hypertension with blood pressure measured today at 138/76.  He is on amlodipine, valsartan and carvedilol.  Hx of CABG History of CAD status post CABG x1 in 1994.  His last Myoview performed 04/11/2016 was low risk and nonischemic.  Patient denies chest pain or shortness of breath.      Lorretta Harp MD FACP,FACC,FAHA, [facility] 06/17/2019 8:25 AM

## 2019-06-17 NOTE — Assessment & Plan Note (Signed)
History of essential hypertension with blood pressure measured today at 138/76.  He is on amlodipine, valsartan and carvedilol.

## 2019-06-17 NOTE — Patient Instructions (Signed)
Medication Instructions:  Your physician recommends that you continue on your current medications as directed. Please refer to the Current Medication list given to you today.  If you need a refill on your cardiac medications before your next appointment, please call your pharmacy.   Lab work: NONE  Testing/Procedures: Your physician has requested that you have a carotid duplex. This test is an ultrasound of the carotid arteries in your neck. It looks at blood flow through these arteries that supply the brain with blood. Allow one hour for this exam. There are no restrictions or special instructions.  Follow-Up: At [facility], you and your health needs are our priority.  As part of our continuing mission to provide you with exceptional heart care, we have created designated Provider Care Teams.  These Care Teams include your primary Cardiologist (physician) and Advanced Practice Providers (APPs -  Physician Assistants and Nurse Practitioners) who all work together to provide you with the care you need, when you need it. You may see Quay Burow, MD or one of the following Advanced Practice Providers on your designated Care Team:    Kerin Ransom, PA-C  [place], [place]  Coletta Memos, [place]  Your physician wants you to follow-up in:  1 year with Dr. Andria Rhein will receive a reminder letter in the mail two months in advance. If you don't receive a letter, please call our office to schedule the follow-up appointment.

## 2019-06-17 NOTE — Assessment & Plan Note (Signed)
History of CAD status post CABG x1 in 1994.  His last Myoview performed 04/11/2016 was low risk and nonischemic.  Patient denies chest pain or shortness of breath.

## 2019-06-26 ENCOUNTER — Encounter: Payer: Self-pay | Admitting: Physician Assistant

## 2019-06-26 ENCOUNTER — Other Ambulatory Visit: Payer: Self-pay

## 2019-06-26 ENCOUNTER — Ambulatory Visit: Payer: Medicare Other | Admitting: Physician Assistant

## 2019-06-26 VITALS — BP 158/80 | HR 68 | Temp 98.6°F | Ht 67.75 in | Wt 185.4 lb

## 2019-06-26 DIAGNOSIS — R109 Unspecified abdominal pain: Secondary | ICD-10-CM | POA: Diagnosis not present

## 2019-06-26 DIAGNOSIS — R140 Abdominal distension (gaseous): Secondary | ICD-10-CM

## 2019-06-26 DIAGNOSIS — K219 Gastro-esophageal reflux disease without esophagitis: Secondary | ICD-10-CM

## 2019-06-26 DIAGNOSIS — K8020 Calculus of gallbladder without cholecystitis without obstruction: Secondary | ICD-10-CM | POA: Diagnosis not present

## 2019-06-26 MED ORDER — DICYCLOMINE HCL 10 MG PO CAPS: 10.0000 mg | ORAL_CAPSULE | Freq: Two times a day (BID) | ORAL | 4 refills | Status: DC | PRN

## 2019-06-26 MED ORDER — PANTOPRAZOLE SODIUM 40 MG PO TBEC: 40.0000 mg | DELAYED_RELEASE_TABLET | Freq: Every day | ORAL | 11 refills | Status: DC

## 2019-06-26 NOTE — Progress Notes (Signed)
Reviewed and agree with management plan.  Harvest Stanco T. Tonyia Marschall, MD FACG  Gastroenterology  

## 2019-06-26 NOTE — Progress Notes (Signed)
Subjective:    Patient ID: Jerry Farrell, male    DOB: Jul 26, 1945, 74 y.o.   MRN: [id number]  HPI Jerry Farrell is a pleasant 74 year old African-American male, established with Jerry Farrell.  He was last seen in 2018.  He was hospitalized briefly in 2018 with a gastric ulcer. Patient comes in today with complaints of recent episodes of abdominal pain.  He has history of hypertension, GERD, prior peptic ulcer disease and is status post remote Billroth I, he has history of iron and B12 deficiency and coronary artery disease for which he underwent CABG.  Also with history of adenomatous polyps.  Patient says his current symptoms started 2 or 3 weeks ago when he began noting abdominal bloating and gassiness and upper abdominal discomfort.  He had some radiation of pain around into his back which he describes as gas pain.  No associated fever or chills, no nausea or vomiting.  Appetite has been fine.  No changes in bowel habits.  He has noticed a lot of gurgling in his abdomen.  He says he had been eating a lot of fatty and fried foods which he has since discontinued and symptoms have improved over the past week.  Symptoms had not been constant but rather intermittent. Reviewing prior imaging he did have gallstones noted several years ago. He is chronically maintained on Protonix 40 mg daily and is not on any aspirin or NSAIDs. Last EGD 2018 with 5 to 6 mm gastric ulcer with adherent clot which was treated. Last colonoscopy 2017, one 4 mm polyp removed from the cecum, this was a tubular adenoma.  He is indicated for 5-year interval follow-up due to family history of colon cancer in his father.   Review of Systems Pertinent positive and negative review of systems were noted in the above HPI section.  All other review of systems was otherwise negative.  Outpatient Encounter Medications as of 76/07/2019  Medication Sig  . amLODipine (NORVASC) 5 MG tablet Take 5 mg by mouth daily.  Marland Kitchen aspirin EC 81 MG tablet  Take 1 tablet (81 mg total) by mouth daily.  Marland Kitchen atorvastatin (LIPITOR) 10 MG tablet Take 1 tablet (10 mg total) by mouth daily.  . carvedilol (COREG) 3.125 MG tablet Take 1 tablet (3.125 mg total) by mouth 2 (two) times daily with a meal.  . doxazosin (CARDURA) 2 MG tablet Take 1 tablet (2 mg total) by mouth daily.  . pantoprazole (PROTONIX) 40 MG tablet Take 1 tablet (40 mg total) by mouth daily.  . tamsulosin (FLOMAX) 0.4 MG CAPS capsule Take 1 capsules by mouth once daily  . valsartan (DIOVAN) 320 MG tablet Take 1 tablet (320 mg total) by mouth daily.  . [DISCONTINUED] pantoprazole (PROTONIX) 40 MG tablet TAKE 1 TABLET BY MOUTH EVERY DAY  . dicyclomine (BENTYL) 10 MG capsule Take 1 capsule (10 mg total) by mouth 2 (two) times daily as needed for spasms.   No facility-administered encounter medications on file as of 06/26/2019.   No Known Allergies Patient Active Problem List   Diagnosis Date Noted  . Coronary artery disease 05/20/2019  . Constipation 08/30/2018  . Fatigue 08/30/2018  . Hyponatremia 08/30/2018  . Dizziness 08/26/2018  . Peptic ulcer disease 06/14/2016  . Iron deficiency anemia 06/14/2016  . B12 deficiency 06/14/2016  . Prediabetes 01/31/2016  . Need for hepatitis C screening test 01/25/2016  . Post-nasal drip 12/10/2015  . GERD (gastroesophageal reflux disease) 07/20/2015  . Routine general medical examination at a health care  facility 10/28/2014  . Elevated alkaline phosphatase level 10/28/2014  . BPH (benign prostatic hyperplasia) 04/29/2014  . Prostate cancer screening 08/11/2013  . Encounter for Medicare annual wellness exam 08/11/2013  . Dyslipidemia, goal LDL below 70 01/09/2007  . ERECTILE DYSFUNCTION 01/09/2007  . Essential hypertension 01/09/2007  . Hx of CABG 01/09/2007  . ESOPHAGITIS 01/09/2007   Social History   Socioeconomic History  . Marital status: Married    Spouse name: Not on file  . Number of children: 3  . Years of education: Not on  file  . Highest education level: Not on file  Occupational History  . Occupation: [employer]    Employer: RETIRED  Tobacco Use  . Smoking status: Never Smoker  . Smokeless tobacco: Never Used  Substance and Sexual Activity  . Alcohol use: No    Alcohol/week: 0.0 standard drinks  . Drug use: No  . Sexual activity: Yes  Other Topics Concern  . Not on file  Social History Narrative  . Not on file   Social Determinants of Health   Financial Resource Strain: Low Risk   . Difficulty of Paying Living Expenses: Not hard at all  Food Insecurity: No Food Insecurity  . Worried About Charity fundraiser in the Last Year: Never true  . Ran Out of Food in the Last Year: Never true  Transportation Needs: No Transportation Needs  . Lack of Transportation (Medical): No  . Lack of Transportation (Non-Medical): No  Physical Activity: Inactive  . Days of Exercise per Week: 0 days  . Minutes of Exercise per Session: 0 min  Stress: No Stress Concern Present  . Feeling of Stress : Not at all  Social Connections:   . Frequency of Communication with Friends and Family: Not on file  . Frequency of Social Gatherings with Friends and Family: Not on file  . Attends Religious Services: Not on file  . Active Member of Clubs or Organizations: Not on file  . Attends Archivist Meetings: Not on file  . Marital Status: Not on file  Intimate Partner Violence: Not At Risk  . Fear of Current or Ex-Partner: No  . Emotionally Abused: No  . Physically Abused: No  . Sexually Abused: No    Jerry Farrell family history includes Cancer in his sister; Colon cancer in his brother and brother; Hypertension in his mother; Throat cancer in his brother.      Objective:    Vitals:   06/26/19 0922  BP: (!) 158/80  Pulse: 68  Temp: 98.6 F (37 C)    Physical Exam Well-developed well-nourished elderly AA/male in no acute distress.  Height, Weight,185 BMI 28.3  HEENT; nontraumatic normocephalic, EOMI,  PER R LA, sclera anicteric. Oropharynx; not examined Neck; supple, no JVD Cardiovascular; regular rate and rhythm with S1-S2, no murmur rub or gallop Pulmonary; Clear bilaterally Abdomen; soft, nontender, nondistended, no palpable mass or hepatosplenomegaly, bowel sounds are active, midline scar Rectal; not done today Skin; benign exam, no jaundice rash or appreciable lesions Extremities; no clubbing cyanosis or edema skin warm and dry Neuro/Psych; alert and oriented x4, grossly nonfocal mood and affect appropriate       Assessment & Farrell:   #87 74 year old African-American male with 3 to 4-week history of intermittent episodes of upper abdominal discomfort gassiness, bloating and pain radiating into his back.  Onset while consuming a lot of fried food. Symptoms improved over the past week.  Etiology not clear, patient does have remotely documented cholelithiasis and  am concerned he may have been experiencing biliary colic. Also with history of peptic ulcer disease status post remote Billroth I and with history of small gastric ulcer with bleed 2018 cannot rule out recurrent peptic ulcer disease.  He is maintained on PPI therapy.  #2 history of adenomatous colon polyps-up-to-date last colonoscopy 2017 #3 family history of colon cancer indicated for 5-year interval follow-up #4 chronic GERD 5.  Coronary artery disease status post CABG 6.  Prior history of iron deficiency and B12 deficiency-most recent labs January 2021 normal ferritin and CBC within normal limits.  Farrell; continue Protonix 40 mg p.o. daily We will schedule for upper abdominal ultrasound Refill dicyclomine 10 mg p.o. twice daily as needed Continue low-fat diet If ultrasound is negative, he may need repeat EGD with Jerry Farrell.  Rayburn Mundis S Oshea Percival PA-C 06/26/2019   Cc: Tower, Wynelle Fanny, MD

## 2019-06-26 NOTE — Patient Instructions (Signed)
Remain on a low fat diet.   We have sent the following medications to your pharmacy for you to pick up at your convenience:Protonix and dicyclomine.   You have been scheduled for an abdominal ultrasound at [facility] (1st floor of hospital) on 07/02/19 at 10:00am. Please arrive 15 minutes prior to your appointment for registration. Make certain not to have anything to eat or drink 6 hours prior to your appointment. Should you need to reschedule your appointment, please contact radiology at [phone]. This test typically takes about 30 minutes to perform.

## 2019-06-27 ENCOUNTER — Other Ambulatory Visit: Payer: Self-pay

## 2019-06-27 ENCOUNTER — Ambulatory Visit (HOSPITAL_COMMUNITY)
Admission: RE | Admit: 2019-06-27 | Discharge: 2019-06-27 | Disposition: A | Discharge status: Home / Self Care | Payer: Medicare Other | Source: Ambulatory Visit | Attending: Cardiovascular Disease | Admitting: Cardiovascular Disease

## 2019-06-27 DIAGNOSIS — I10 Essential (primary) hypertension: Secondary | ICD-10-CM | POA: Diagnosis not present

## 2019-06-27 DIAGNOSIS — R0989 Other specified symptoms and signs involving the circulatory and respiratory systems: Secondary | ICD-10-CM | POA: Diagnosis not present

## 2019-06-27 DIAGNOSIS — Z951 Presence of aortocoronary bypass graft: Secondary | ICD-10-CM | POA: Diagnosis not present

## 2019-07-02 ENCOUNTER — Other Ambulatory Visit: Payer: Self-pay

## 2019-07-02 ENCOUNTER — Ambulatory Visit (HOSPITAL_COMMUNITY)
Admission: RE | Admit: 2019-07-02 | Discharge: 2019-07-02 | Disposition: A | Discharge status: Home / Self Care | Payer: Medicare Other | Source: Ambulatory Visit | Attending: Physician Assistant | Admitting: Physician Assistant

## 2019-07-02 DIAGNOSIS — K219 Gastro-esophageal reflux disease without esophagitis: Secondary | ICD-10-CM | POA: Insufficient documentation

## 2019-07-02 DIAGNOSIS — K8020 Calculus of gallbladder without cholecystitis without obstruction: Secondary | ICD-10-CM | POA: Diagnosis not present

## 2019-07-02 DIAGNOSIS — R109 Unspecified abdominal pain: Secondary | ICD-10-CM | POA: Insufficient documentation

## 2019-07-03 ENCOUNTER — Other Ambulatory Visit: Payer: Self-pay | Admitting: Physician Assistant

## 2019-07-16 ENCOUNTER — Telehealth: Payer: Self-pay

## 2019-07-16 ENCOUNTER — Other Ambulatory Visit: Payer: Self-pay | Admitting: Cardiovascular Disease

## 2019-07-16 NOTE — Telephone Encounter (Signed)
Amy from [facility] calls with follow up of the referral for the patient. He has been contacted and will see Dr C Conners on 08/06/19.

## 2019-07-23 ENCOUNTER — Other Ambulatory Visit: Payer: Self-pay | Admitting: Physician Assistant

## 2019-08-06 ENCOUNTER — Encounter: Payer: Self-pay | Admitting: Surgery

## 2019-08-06 DIAGNOSIS — K8020 Calculus of gallbladder without cholecystitis without obstruction: Secondary | ICD-10-CM | POA: Diagnosis not present

## 2019-08-23 ENCOUNTER — Other Ambulatory Visit: Payer: Self-pay | Admitting: Physician Assistant

## 2019-08-25 DIAGNOSIS — H35372 Puckering of macula, left eye: Secondary | ICD-10-CM | POA: Diagnosis not present

## 2019-08-25 DIAGNOSIS — H04121 Dry eye syndrome of right lacrimal gland: Secondary | ICD-10-CM | POA: Diagnosis not present

## 2019-08-25 DIAGNOSIS — H40053 Ocular hypertension, bilateral: Secondary | ICD-10-CM | POA: Diagnosis not present

## 2019-09-04 ENCOUNTER — Other Ambulatory Visit: Payer: Self-pay | Admitting: Physician Assistant

## 2019-09-08 DIAGNOSIS — H04121 Dry eye syndrome of right lacrimal gland: Secondary | ICD-10-CM | POA: Diagnosis not present

## 2019-09-08 DIAGNOSIS — H35372 Puckering of macula, left eye: Secondary | ICD-10-CM | POA: Diagnosis not present

## 2019-09-08 DIAGNOSIS — H26492 Other secondary cataract, left eye: Secondary | ICD-10-CM | POA: Diagnosis not present

## 2019-09-08 DIAGNOSIS — H40053 Ocular hypertension, bilateral: Secondary | ICD-10-CM | POA: Diagnosis not present

## 2019-11-23 ENCOUNTER — Other Ambulatory Visit: Payer: Self-pay | Admitting: Cardiology

## 2019-12-01 ENCOUNTER — Other Ambulatory Visit: Payer: Self-pay | Admitting: Cardiology

## 2019-12-29 ENCOUNTER — Other Ambulatory Visit: Payer: Self-pay | Admitting: Cardiology

## 2020-01-19 ENCOUNTER — Encounter: Payer: Self-pay | Admitting: Family Medicine

## 2020-01-19 ENCOUNTER — Other Ambulatory Visit: Payer: Self-pay

## 2020-01-19 ENCOUNTER — Ambulatory Visit (INDEPENDENT_AMBULATORY_CARE_PROVIDER_SITE_OTHER): Payer: Medicare Other | Admitting: Family Medicine

## 2020-01-19 VITALS — BP 150/80 | HR 80 | Temp 98.1°F | Ht 67.5 in | Wt 184.5 lb

## 2020-01-19 DIAGNOSIS — Z23 Encounter for immunization: Secondary | ICD-10-CM | POA: Diagnosis not present

## 2020-01-19 DIAGNOSIS — M79604 Pain in right leg: Secondary | ICD-10-CM | POA: Diagnosis not present

## 2020-01-19 DIAGNOSIS — S86111A Strain of other muscle(s) and tendon(s) of posterior muscle group at lower leg level, right leg, initial encounter: Secondary | ICD-10-CM | POA: Diagnosis not present

## 2020-01-19 NOTE — Progress Notes (Signed)
Shuree Brossart T. Corey Laski, MD, Campus  Primary Care and [place] [facility] [place], [address]  Phone: [phone]  FAX: [phone]  Jerry Farrell - 74 y.o. male  MRN [id number]  Date of Birth: [birth year]  Date: 01/19/2020  PCP: Abner Greenspan, MD  Referral: Abner Greenspan, MD  Chief Complaint  Patient presents with  . Leg Pain    Right Calf-Hurt bowling  . Knee Pain    Right    This visit occurred during the SARS-CoV-2 public health emergency.  Safety protocols were in place, including screening questions prior to the visit, additional usage of staff PPE, and extensive cleaning of exam room while observing appropriate contact time as indicated for disinfecting solutions.   Subjective:   Jerry Farrell is a 74 y.o. very pleasant male patient with Body mass index is 28.47 kg/m. who presents with the following:  He presents with acute leg pain after bowling.  Felt a sting in his R calf, medial calf.  Was able to mow his lawn, but now it is hurting worse and he is having trouble ambulating and walking with a limp.  He does have some bruising in and about the calf both distally and proximally.  He also is having some leg swelling on the injured side.  He has not tried much except for some basic over-the-counter icing and Tylenol.  Review of Systems is noted in the HPI, as appropriate   Objective:   BP (!) 150/80   Pulse 80   Temp 98.1 F (36.7 C) (Temporal)   Ht 5' 7.5" (1.715 m)   Wt 184 lb 8 oz (83.7 kg)   SpO2 98%   BMI 28.47 kg/m    GEN: No acute distress; alert,appropriate. PULM: Breathing comfortably in no respiratory distress PSYCH: Normally interactive.    He does have pain in the mid calf, predominantly in the medial calf at the musculotendinous junction there is also no defect, but relatively extensive bruising.  There is a fullness there that is tender to palpation.  He does not  have significant pain in the proximal calf.  Does have swelling in the lower extremity through the foot that is 1+.  He is able to dorsiflex and plantar flex at the ankle.  Plantar flexion does cause pain.  Inversion and eversion are preserved.  Radiology: No results found.  Assessment and Plan:     ICD-10-CM   1. Gastrocnemius tear, right, initial encounter  S86.111A   2. Acute leg pain, right  M79.604   3. Need for influenza vaccination  Z23 Flu Vaccine QUAD High Dose(Fluad)   Hopefully this will do okay, avoid stretching and any ballistic movements for scar to form.  I would anticipate 2 months for recovery.  He understands that he should not bowl until he is fully able to walk without any difficulty and do any contralateral or cutting movements without any kind of difficulty.  I reviewed with him some compression sleeves to try.  If he gets worse, I am always happy to evaluate him again.  No orders of the defined types were placed in this encounter.  There are no discontinued medications. Orders Placed This Encounter  Procedures  . Flu Vaccine QUAD High Dose(Fluad)    Follow-up: No follow-ups on file.  Signed,  Maud Deed. Yanai Hobson, MD   Outpatient Encounter Medications as of 01/19/2020  Medication Sig  . amLODipine (NORVASC) 5 MG  tablet TAKE 1 TABLET BY MOUTH EVERY DAY  . aspirin EC 81 MG tablet Take 1 tablet (81 mg total) by mouth daily.  Marland Kitchen atorvastatin (LIPITOR) 10 MG tablet TAKE 1 TABLET BY MOUTH EVERY DAY  . carvedilol (COREG) 3.125 MG tablet TAKE 1 TABLET BY MOUTH 2 TIMES DAILY WITH A MEAL.  Marland Kitchen dicyclomine (BENTYL) 10 MG capsule TAKE 1 CAPSULE (10 MG TOTAL) BY MOUTH 2 (TWO) TIMES DAILY AS NEEDED FOR SPASMS.  Marland Kitchen doxazosin (CARDURA) 2 MG tablet TAKE 1 TABLET BY MOUTH EVERY DAY  . pantoprazole (PROTONIX) 40 MG tablet Take 1 tablet (40 mg total) by mouth daily.  . tamsulosin (FLOMAX) 0.4 MG CAPS capsule Take 1 capsules by mouth once daily  . valsartan (DIOVAN) 320 MG  tablet TAKE 1 TABLET BY MOUTH EVERY DAY   No facility-administered encounter medications on file as of 01/19/2020.

## 2020-02-05 ENCOUNTER — Other Ambulatory Visit: Payer: Self-pay | Admitting: Cardiovascular Disease

## 2020-03-11 ENCOUNTER — Other Ambulatory Visit: Payer: Self-pay | Admitting: Physician Assistant

## 2020-03-22 ENCOUNTER — Telehealth: Payer: Self-pay | Admitting: Cardiovascular Disease

## 2020-03-22 NOTE — Telephone Encounter (Signed)
Spoke with patient who states that he has been experiencing ankle and leg swelling for several months. Patient states that he has been taking Amlodipine and he spoke with his PCP who stated that the swelling could have been coming from the Amlodipine. Patient states that both ankle and lower legs are swollen but the Right leg is worse than the left. Patient denies any pain in legs or calves, warmth or redness in either calf. Patient states that the swelling is worse after being on his feet for long periods of time. Patient states that after taking socks off his legs itch very bad but denies any rash. Patient states that his recent blood pressure taken at home was 131/73. Patient denies any shortness of breath, chest pain, dizziness or any other symptoms. Patient would like to know if there were any recommendations regarding his swelling in his legs and ankles. Will forward to PharmD and Dr. Gwenlyn Found for review and advice.

## 2020-03-22 NOTE — Telephone Encounter (Signed)
Pt c/o medication issue:  1. Name of Medication:  amLODipine (NORVASC) 5 MG tablet [[id number]]    2. How are you currently taking this medication (dosage and times per day)? TAKE 1 TABLET BY MOUTH EVERY DAY  3. Are you having a reaction (difficulty breathing--STAT)? Not Stat   4. What is your medication issue? Pt is having a reaction to this med.  Causing his legs to itch and ankle to swell slightly.     Best number  [phone]

## 2020-03-22 NOTE — Telephone Encounter (Signed)
Called patient to discuss possible side effect of amlodipine. Patient stated they noticed swelling in their legs since they started amlodipine almost a year ago. The swelling has worsened in the past couple of months. Patient was started on amlodipine 5 mg daily and reports no dose adjustments. Patient elevates their leg when they sit down and wears compression stockings which he states have not made a difference in his leg swelling. He reported having a tear a few months ago which has gotten better. Recommended that patient decrease dose of amlodipine to 2.5 mg daily (1/2 tablet) for now and we will re-evaluate his swelling to determine if patient should be on alternative medication for BP control or if swelling is from a different cause.

## 2020-03-22 NOTE — Telephone Encounter (Signed)
Cyril Mourning, can you please see Mr. Noboa in the office, discontinue amlodipine and start him on another antihypertensive medication and assess whether his edema is related to this

## 2020-03-25 NOTE — Telephone Encounter (Signed)
Could you please call him to set an appointment with [facility] at [facility]  Thank you!

## 2020-03-25 NOTE — Telephone Encounter (Signed)
Left message for patient to call back and schedule appt with the Pharmacist

## 2020-03-26 ENCOUNTER — Ambulatory Visit (INDEPENDENT_AMBULATORY_CARE_PROVIDER_SITE_OTHER): Payer: Medicare Other | Admitting: Family Medicine

## 2020-03-26 ENCOUNTER — Encounter: Payer: Self-pay | Admitting: Family Medicine

## 2020-03-26 ENCOUNTER — Other Ambulatory Visit: Payer: Self-pay

## 2020-03-26 VITALS — BP 148/70 | HR 65 | Temp 97.5°F | Ht 67.5 in | Wt 190.4 lb

## 2020-03-26 DIAGNOSIS — R600 Localized edema: Secondary | ICD-10-CM

## 2020-03-26 DIAGNOSIS — I10 Essential (primary) hypertension: Secondary | ICD-10-CM

## 2020-03-26 DIAGNOSIS — R21 Rash and other nonspecific skin eruption: Secondary | ICD-10-CM | POA: Insufficient documentation

## 2020-03-26 MED ORDER — TRIAMCINOLONE ACETONIDE 0.1 % EX CREA
1.0000 | TOPICAL_CREAM | Freq: Two times a day (BID) | CUTANEOUS | 1 refills | Status: DC
Start: 2020-03-26 — End: 2020-06-23

## 2020-03-26 NOTE — Progress Notes (Signed)
Subjective:    Patient ID: Jerry Farrell, male    DOB: [birth year], 74 y.o.   MRN: [id number]  This visit occurred during the SARS-CoV-2 public health emergency.  Safety protocols were in place, including screening questions prior to the visit, additional usage of staff PPE, and extensive cleaning of exam room while observing appropriate contact time as indicated for disinfecting solutions.    HPI Pt presents with c/o rash  Also having edema in ankles from BP med  Wt Readings from Last 3 Encounters:  03/26/20 190 lb 7 oz (86.4 kg)  01/19/20 184 lb 8 oz (83.7 kg)  06/26/19 185 lb 6 oz (84.1 kg)   29.39 kg/m  Has a gastroc injury R leg recently- R lower leg stays swollen   Little bumps and itching -started on right leg  Then went to his chest and abdomen  Few areas on L hip  A little itch on arms/hands -but no bumps  Nothing between fingers or in axillae   Itches everywhere he has bumps   HTN ? If amlodipine caused swelling in ankles  BP Readings from Last 3 Encounters:  03/26/20 (!) 166/80  01/19/20 (!) 150/80  06/26/19 (!) 158/80   Re check BP: (!) 148/70   At home 134/70  Taking amlodipine 5 mg daily -recently cut to 2.5 mg due to edema  Carvedilol 3.125 mg bid Doxazosin 2 mg daily  Valsartan 320 mg daily   Patient Active Problem List   Diagnosis Date Noted  . Rash 03/26/2020  . Gallstones 06/26/2019  . Coronary artery disease 05/20/2019  . Constipation 08/30/2018  . Fatigue 08/30/2018  . Hyponatremia 08/30/2018  . Dizziness 08/26/2018  . Peptic ulcer disease 06/14/2016  . Iron deficiency anemia 06/14/2016  . B12 deficiency 06/14/2016  . Prediabetes 01/31/2016  . Need for hepatitis C screening test 01/25/2016  . Post-nasal drip 12/10/2015  . GERD (gastroesophageal reflux disease) 07/20/2015  . Routine general medical examination at a health care facility 10/28/2014  . Elevated alkaline phosphatase level 10/28/2014  . BPH (benign prostatic hyperplasia)  04/29/2014  . Prostate cancer screening 08/11/2013  . Encounter for Medicare annual wellness exam 08/11/2013  . Dyslipidemia, goal LDL below 70 01/09/2007  . ERECTILE DYSFUNCTION 01/09/2007  . Essential hypertension 01/09/2007  . Hx of CABG 01/09/2007  . ESOPHAGITIS 01/09/2007   Past Medical History:  Diagnosis Date  . BPH (benign prostatic hyperplasia)   . CAD (coronary artery disease)    cardiolite ok 3/05, stress test- low risk study 11/06  . Chronic low back pain    with radiculopathy  . ED (erectile dysfunction)   . Gallstones    abd [place]- stable hamangioma, gallbladder sludge and tiny stones 09/2003  . GERD (gastroesophageal reflux disease)   . Hyperlipidemia   . Hypertension   . Transfusion history    Past Surgical History:  Procedure Laterality Date  . CATARACT EXTRACTION W/PHACO  03/13/2012   Procedure: CATARACT EXTRACTION PHACO AND INTRAOCULAR LENS PLACEMENT (IOC);  Surgeon: Adonis Brook, MD;  Location: [facility];  Service: Ophthalmology;  Laterality: Left;  . CORONARY ARTERY BYPASS GRAFT  1998   x 1  done [facility] at [facility]  . ESOPHAGOGASTRODUODENOSCOPY (EGD) WITH PROPOFOL N/A 06/07/2016   Procedure: ESOPHAGOGASTRODUODENOSCOPY (EGD) WITH PROPOFOL;  Surgeon: Mauri Pole, MD;  Location: [facility];  Service: Endoscopy;  Laterality: N/A;  . GASTRECTOMY     partial  . MEMBRANE PEEL  03/13/2012   Procedure: MEMBRANE PEEL;  Surgeon: Adonis Brook,  MD;  Location: [facility];  Service: Ophthalmology;  Laterality: Left;  . PARS PLANA VITRECTOMY  03/13/2012   Procedure: PARS PLANA VITRECTOMY WITH 23 GAUGE;  Surgeon: Adonis Brook, MD;  Location: [facility];  Service: Ophthalmology;  Laterality: Left;   Social History   Tobacco Use  . Smoking status: Never Smoker  . Smokeless tobacco: Never Used  Vaping Use  . Vaping Use: Never used  Substance Use Topics  . Alcohol use: No    Alcohol/week: 0.0 standard drinks  . Drug use: No   Family History  Problem Relation Age of Onset  .  Cancer Sister        unknown type, pt feels alcohol related  . Colon cancer Brother   . Throat cancer Brother        pt feels alcohol related  . Colon cancer Brother        1/2 brother  . Hypertension Mother   . Stomach cancer Neg Hx   . Pancreatic cancer Neg Hx    No Known Allergies Current Outpatient Medications on File Prior to Visit  Medication Sig Dispense Refill  . amLODipine (NORVASC) 5 MG tablet TAKE 1 TABLET BY MOUTH EVERY DAY (Patient taking differently: 2.5 mg. ) 90 tablet 3  . aspirin EC 81 MG tablet Take 1 tablet (81 mg total) by mouth daily. 90 tablet 3  . atorvastatin (LIPITOR) 10 MG tablet TAKE 1 TABLET BY MOUTH EVERY DAY 90 tablet 3  . carvedilol (COREG) 3.125 MG tablet TAKE 1 TABLET BY MOUTH 2 TIMES DAILY WITH A MEAL. 180 tablet 3  . dicyclomine (BENTYL) 10 MG capsule TAKE 1 CAPSULE (10 MG TOTAL) BY MOUTH 2 (TWO) TIMES DAILY AS NEEDED FOR SPASMS. 180 capsule 0  . doxazosin (CARDURA) 2 MG tablet TAKE 1 TABLET BY MOUTH EVERY DAY 90 tablet 2  . pantoprazole (PROTONIX) 40 MG tablet Take 1 tablet (40 mg total) by mouth daily. 30 tablet 11  . tamsulosin (FLOMAX) 0.4 MG CAPS capsule Take 1 capsules by mouth once daily 90 capsule 3  . valsartan (DIOVAN) 320 MG tablet TAKE 1 TABLET BY MOUTH EVERY DAY 90 tablet 1   No current facility-administered medications on file prior to visit.     Review of Systems  Constitutional: Negative for activity change, appetite change, fatigue, fever and unexpected weight change.  HENT: Negative for congestion, rhinorrhea, sore throat and trouble swallowing.   Eyes: Negative for pain, redness, itching and visual disturbance.  Respiratory: Negative for cough, chest tightness, shortness of breath and wheezing.   Cardiovascular: Positive for leg swelling. Negative for chest pain and palpitations.  Gastrointestinal: Negative for abdominal pain, blood in stool, constipation, diarrhea and nausea.  Endocrine: Negative for cold intolerance, heat  intolerance, polydipsia and polyuria.  Genitourinary: Negative for difficulty urinating, dysuria, frequency and urgency.  Musculoskeletal: Negative for arthralgias, joint swelling and myalgias.  Skin: Positive for rash. Negative for pallor and wound.  Neurological: Negative for dizziness, tremors, weakness, numbness and headaches.  Hematological: Negative for adenopathy. Does not bruise/bleed easily.  Psychiatric/Behavioral: Negative for decreased concentration and dysphoric mood. The patient is not nervous/anxious.        Objective:   Physical Exam Constitutional:      General: He is not in acute distress.    Appearance: Normal appearance. He is well-developed and normal weight. He is not ill-appearing.  HENT:     Head: Normocephalic and atraumatic.  Eyes:     Conjunctiva/sclera: Conjunctivae normal.  Pupils: Pupils are equal, round, and reactive to light.  Neck:     Thyroid: No thyromegaly.     Vascular: No carotid bruit or JVD.  Cardiovascular:     Rate and Rhythm: Normal rate and regular rhythm.     Heart sounds: Normal heart sounds. No gallop.   Pulmonary:     Effort: Pulmonary effort is normal. No respiratory distress.     Breath sounds: Normal breath sounds. No wheezing or rales.  Abdominal:     General: Bowel sounds are normal. There is no distension or abdominal bruit.     Palpations: Abdomen is soft. There is no mass.     Tenderness: There is no abdominal tenderness.  Musculoskeletal:     Cervical back: Normal range of motion and neck supple.     Right lower leg: Edema present.     Left lower leg: No edema.     Comments: Mild swelling or R lower leg (s/p gastroc injury)  No pitting   Lymphadenopathy:     Cervical: No cervical adenopathy.  Skin:    General: Skin is warm and dry.     Findings: Rash present. No erythema.     Comments: Areas of dryness/scale on extremities  Few excoriations No sings of infection   Neurological:     Mental Status: He is alert.       Coordination: Coordination normal.     Deep Tendon Reflexes: Reflexes are normal and symmetric.  Psychiatric:        Mood and Affect: Mood normal.           Assessment & Plan:   Problem List Items Addressed This Visit      Cardiovascular and Mediastinum   Essential hypertension    BP is better at home than here  BP Readings from Last 3 Encounters:  03/26/20 (!) 148/70  01/19/20 (!) 150/80  06/26/19 (!) 158/80   Plans to touch base with cardiology  Recently amlodipine cut to 2.5 mg daily        Musculoskeletal and Integument   Rash - Primary    Dry patches of skin resembling eczema  Px triamcinolone Disc need to avoid hot water/harsh detergents and fabric softener Adv to use moisturizer         Other   Pedal edema    This seems to be on the R (in response to a calf injury)  None on L today  Recently his amlodipine dose was cut back  Pt plans to update cardiology

## 2020-03-26 NOTE — Patient Instructions (Addendum)
You may have some eczema (rash/itching)   Avoid scented detergents or fragrances in soap or other products  Avoid hot water- better to take tepid showers  Dove soap for sensitive skin is the best   Avoid fabric softener in the laundry   Use a non scented moisturizer like cetaphil or eucerin  The triamcinolone cream   Update if not starting to improve in a week or if worsening    Keep the cardiologist informed about blood pressure

## 2020-03-28 DIAGNOSIS — R600 Localized edema: Secondary | ICD-10-CM | POA: Insufficient documentation

## 2020-03-28 NOTE — Assessment & Plan Note (Signed)
This seems to be on the R (in response to a calf injury)  None on L today  Recently his amlodipine dose was cut back  Pt plans to update cardiology

## 2020-03-28 NOTE — Assessment & Plan Note (Signed)
BP is better at home than here  BP Readings from Last 3 Encounters:  03/26/20 (!) 148/70  01/19/20 (!) 150/80  06/26/19 (!) 158/80   Plans to touch base with cardiology  Recently amlodipine cut to 2.5 mg daily

## 2020-03-28 NOTE — Assessment & Plan Note (Signed)
Dry patches of skin resembling eczema  Px triamcinolone Disc need to avoid hot water/harsh detergents and fabric softener Adv to use moisturizer

## 2020-03-29 ENCOUNTER — Ambulatory Visit: Payer: Medicare Other | Admitting: Family Medicine

## 2020-05-03 ENCOUNTER — Other Ambulatory Visit: Payer: Self-pay

## 2020-05-03 ENCOUNTER — Ambulatory Visit (INDEPENDENT_AMBULATORY_CARE_PROVIDER_SITE_OTHER): Payer: Medicare Other | Admitting: Pharmacist Clinician (PhC)/ Clinical Pharmacy Specialist

## 2020-05-03 DIAGNOSIS — I10 Essential (primary) hypertension: Secondary | ICD-10-CM

## 2020-05-03 MED ORDER — DOXAZOSIN MESYLATE 4 MG PO TABS: 4.0000 mg | ORAL_TABLET | Freq: Every day | ORAL | 3 refills | Status: DC

## 2020-05-03 NOTE — Progress Notes (Signed)
05/25/2020 KHOI HAMBERGER 1946-01-11 [id number]   HPI:  Jerry Farrell is a 75 y.o. male patient of Dr Gwenlyn Found, with a PMH below who presents today for hypertension clinic follow up.  See medical history below.  Patient saw Kerin Ransom PA in August. With systolic readings in the 268'T, amlodipine was added to his doxazosin, carvedilol and valsartan and he was referred to [facility] for follow up.   We have seen him in [facility] several times since then.  He recently reported feeling dizziness with positional changes and some SOB with carrying heavy items (but not with extended walking).  At his last visit the valsartan was increased to 320 mg daily.   He called in Nov 2021 with concerns about swelling in his legs and itchy skin, primary thought this was due to his amlodipine.  The dose was cut from 5 to 2.5 mg and he was asked to record his pressures and come in for follow up.    He returns today for follow up.  He notes that the itching and lower leg swelling have continued despite cutting amlodipine dose.  Notes that the itching is mostly in his lower legs, but will also occur on his torso and arms at times.     Past Medical History: Hyperlipidemia 12020:  TC 176, TG 219, HDL 47.8, LDL 80  CAD S/p CABG x 1 in 1994  BPH On tamsulosin and doxazosin 2 mg  Pre-diabetes A1c 04/2018 5.8, previously 6.0        Blood Pressure Goal:  130/80  Current Medications:  Amlodipine 2.5 mg qd, carvedilol 3.125 mg bid, doxazosin 2 mg qd, valsartan 320 mg qd (pm)  Family Hx:  Sister: HLD, CABG Brother: HLD, CABG Children- no information  Social Hx: denies tobacco use, denies alcohol use  Diet: loves pistachio's; Eats chicken & hamburgers as primary protein, canned vegetables, wife cooks most meals at home, not often eating take out  Exercise: walks to mow the grass, reports less daily outdoor walking over the last few months due to him being lazy  Home BP readings: has Omron device, several years old;  home  readings show systolic to be mostly in the 120-140 range, with diastolic 41-96.     Intolerances: had previously been on hctz, but chart notes discontinued due to hyponatremia  Labs:  08/2018:  Na 140, K 4.2, Glu 94, BUN 13, SCr 0.95 (CrCl 79.4)  03/2019:  Na 140, K 4.2, Glu 85, BUN 15, SCr 0.96 (CrCl 78.7)  Wt Readings from Last 3 Encounters:  05/25/20 187 lb (84.8 kg)  05/03/20 190 lb (86.2 kg)  03/26/20 190 lb 7 oz (86.4 kg)   BP Readings from Last 3 Encounters:  05/25/20 (!) 184/86  05/03/20 (!) 160/74  03/26/20 (!) 148/70   Pulse Readings from Last 3 Encounters:  05/25/20 68  05/03/20 (!) 58  03/26/20 65    Current Outpatient Medications  Medication Sig Dispense Refill  . aspirin EC 81 MG tablet Take 1 tablet (81 mg total) by mouth daily. 90 tablet 3  . atorvastatin (LIPITOR) 10 MG tablet TAKE 1 TABLET BY MOUTH EVERY DAY 90 tablet 3  . carvedilol (COREG) 3.125 MG tablet TAKE 1 TABLET BY MOUTH 2 TIMES DAILY WITH A MEAL. 180 tablet 3  . dicyclomine (BENTYL) 10 MG capsule TAKE 1 CAPSULE (10 MG TOTAL) BY MOUTH 2 (TWO) TIMES DAILY AS NEEDED FOR SPASMS. 180 capsule 0  . doxazosin (CARDURA) 4 MG tablet Take  1 tablet (4 mg total) by mouth daily. 90 tablet 3  . pantoprazole (PROTONIX) 40 MG tablet Take 1 tablet (40 mg total) by mouth daily. 30 tablet 11  . triamcinolone (KENALOG) 0.1 % Apply 1 application topically 2 (two) times daily. To affected (rash) areas 30 g 1  . valsartan (DIOVAN) 320 MG tablet TAKE 1 TABLET BY MOUTH EVERY DAY 90 tablet 1  . hydrALAZINE (APRESOLINE) 25 MG tablet Take 1 tablet (25 mg total) by mouth 3 (three) times daily. 60 tablet 5  . tamsulosin (FLOMAX) 0.4 MG CAPS capsule Take 1 capsules by mouth once daily. NEEDS OFFICE VISIT 90 capsule 0   No current facility-administered medications for this visit.    No Known Allergies  Past Medical History:  Diagnosis Date  . BPH (benign prostatic hyperplasia)   . CAD (coronary artery disease)    cardiolite ok  3/05, stress test- low risk study 11/06  . Chronic low back pain    with radiculopathy  . ED (erectile dysfunction)   . Gallstones    abd [place]- stable hamangioma, gallbladder sludge and tiny stones 09/2003  . GERD (gastroesophageal reflux disease)   . Hyperlipidemia   . Hypertension   . Transfusion history     Blood pressure (!) 160/74, pulse (!) 58, height 5\' 8"  (1.727 m), weight 190 lb (86.2 kg).  Essential hypertension Patient with essential hypertension, currently not at goal.  Will have him discontinue amlodipine and increase doxazosin to 4 mg each night.  He is to continue with home monitoring and we will see him back in the office in 3-4 weeks for follow up.     Tommy Medal PharmD CPP [facility] [address] [place], [place] [address] [phone]

## 2020-05-03 NOTE — Patient Instructions (Signed)
Return for a a follow up appointment Feb 1 at 8 am  Check your blood pressure at home daily and keep record of the readings.  Take your BP meds as follows:  Stop amlodipine.    Increase doxazosin to 4 mg daily (take 2 of the 2 mg tablets until gone, then start the 4 mg prescription)  Bring all of your meds, your BP cuff and your record of home blood pressures to your next appointment.  Exercise as you're able, try to walk approximately 30 minutes per day.  Keep salt intake to a minimum, especially watch canned and prepared boxed foods.  Eat more fresh fruits and vegetables and fewer canned items.  Avoid eating in fast food restaurants.    HOW TO TAKE YOUR BLOOD PRESSURE: . Rest 5 minutes before taking your blood pressure. .  Don't smoke or drink caffeinated beverages for at least 30 minutes before. . Take your blood pressure before (not after) you eat. . Sit comfortably with your back supported and both feet on the floor (don't cross your legs). . Elevate your arm to heart level on a table or a desk. . Use the proper sized cuff. It should fit smoothly and snugly around your bare upper arm. There should be enough room to slip a fingertip under the cuff. The bottom edge of the cuff should be 1 inch above the crease of the elbow. . Ideally, take 3 measurements at one sitting and record the average.

## 2020-05-09 ENCOUNTER — Other Ambulatory Visit: Payer: Self-pay | Admitting: Family Medicine

## 2020-05-25 ENCOUNTER — Encounter: Payer: Self-pay | Admitting: Pharmacist Clinician (PhC)/ Clinical Pharmacy Specialist

## 2020-05-25 ENCOUNTER — Other Ambulatory Visit: Payer: Self-pay

## 2020-05-25 ENCOUNTER — Ambulatory Visit (INDEPENDENT_AMBULATORY_CARE_PROVIDER_SITE_OTHER): Payer: Medicare Other | Admitting: Pharmacist Clinician (PhC)/ Clinical Pharmacy Specialist

## 2020-05-25 DIAGNOSIS — I10 Essential (primary) hypertension: Secondary | ICD-10-CM

## 2020-05-25 MED ORDER — HYDRALAZINE HCL 25 MG PO TABS
25.0000 mg | ORAL_TABLET | Freq: Three times a day (TID) | ORAL | 5 refills | Status: DC
Start: 2020-05-25 — End: 2021-01-19

## 2020-05-25 NOTE — Patient Instructions (Signed)
Return for a a follow up appointment with Dr. Gwenlyn Found on Feb 25  Check your blood pressure at home daily and keep record of the readings.  Take your BP meds as follows:  Start hydralazine 25 mg twice daily.  Continue with all other medications.  Bring all of your meds, your BP cuff and your record of home blood pressures to your next appointment.  Exercise as you're able, try to walk approximately 30 minutes per day.  Keep salt intake to a minimum, especially watch canned and prepared boxed foods.  Eat more fresh fruits and vegetables and fewer canned items.  Avoid eating in fast food restaurants.    HOW TO TAKE YOUR BLOOD PRESSURE: . Rest 5 minutes before taking your blood pressure. .  Don't smoke or drink caffeinated beverages for at least 30 minutes before. . Take your blood pressure before (not after) you eat. . Sit comfortably with your back supported and both feet on the floor (don't cross your legs). . Elevate your arm to heart level on a table or a desk. . Use the proper sized cuff. It should fit smoothly and snugly around your bare upper arm. There should be enough room to slip a fingertip under the cuff. The bottom edge of the cuff should be 1 inch above the crease of the elbow. . Ideally, take 3 measurements at one sitting and record the average.

## 2020-05-25 NOTE — Progress Notes (Signed)
05/25/2020 [name] [birth year] [id number]   HPI:  Jerry Farrell is a 75 y.o. male patient of Dr Jerry Farrell, with a PMH below who presents today for hypertension clinic follow up.  See medical history below.  Patient saw Kerin Ransom PA in August. With systolic readings in the 825'K, amlodipine was added to his doxazosin, carvedilol and valsartan and he was referred to [facility] for follow up.   We have seen him in [facility] several times since then.  He recently reported feeling dizziness with positional changes and some SOB with carrying heavy items (but not with extended walking).   We saw him last month, after he developed edema/itching that was assumed to be from amlodipine.  This was stopped and his doxazosin increased to 4 mg each night.    He returns today for follow up.  He states stopped the amlodipine last month, and also started triamcinolone cream for the itching skin.  Reports that it still occurs most day, the swelling easily noted when he sits for more than an hour or so.  He has home readings with him today (see below) which show a definitely higher trend in the evenings.      198/96 62 forst 174/90 second   Past Medical History: Hyperlipidemia 12020:  TC 176, TG 219, HDL 47.8, LDL 80  CAD S/p CABG x 1 in 1994  BPH On tamsulosin and doxazosin 2 mg  Pre-diabetes A1c 04/2018 5.8, previously 6.0        Blood Pressure Goal:  130/80  Current Medications:   carvedilol 3.125 mg bid, doxazosin 4 mg qhs, valsartan 320 mg qd (pm)  Family Hx:  Sister: HLD, CABG Brother: HLD, CABG Children- no information  Social Hx: denies tobacco use, denies alcohol use  Diet: loves pistachio's; Eats chicken & hamburgers as primary protein, canned vegetables, wife cooks most meals at home, not often eating take out  Exercise: walks to mow the grass, reports less daily outdoor walking over the last few months due to him being lazy, bowls twice weekly  Home BP readings: has Omron device, several  years old; read about 15/10 points higher that office reading today; second try dropped to 10/6 points lower than office.    19 morning readings average 142/78  16 evening readings average 151/77  Intolerances: had previously been on hctz, but chart notes discontinued due to hyponatremia  Labs:  08/2018:  Na 140, K 4.2, Glu 94, BUN 13, SCr 0.95 (CrCl 79.4)  03/2019:  Na 140, K 4.2, Glu 85, BUN 15, SCr 0.96 (CrCl 78.7)  04/2020:  Na 140, K 4.1, Glu 93, BUN 12, SCr 0.94, GFR 94.5  Wt Readings from Last 3 Encounters:  05/25/20 187 lb (84.8 kg)  05/03/20 190 lb (86.2 kg)  03/26/20 190 lb 7 oz (86.4 kg)   BP Readings from Last 3 Encounters:  05/25/20 (!) 184/86  05/03/20 (!) 160/74  03/26/20 (!) 148/70   Pulse Readings from Last 3 Encounters:  05/25/20 68  05/03/20 (!) 58  03/26/20 65    Current Outpatient Medications  Medication Sig Dispense Refill  . aspirin EC 81 MG tablet Take 1 tablet (81 mg total) by mouth daily. 90 tablet 3  . atorvastatin (LIPITOR) 10 MG tablet TAKE 1 TABLET BY MOUTH EVERY DAY 90 tablet 3  . carvedilol (COREG) 3.125 MG tablet TAKE 1 TABLET BY MOUTH 2 TIMES DAILY WITH A MEAL. 180 tablet 3  . dicyclomine (BENTYL) 10 MG capsule TAKE  1 CAPSULE (10 MG TOTAL) BY MOUTH 2 (TWO) TIMES DAILY AS NEEDED FOR SPASMS. 180 capsule 0  . doxazosin (CARDURA) 4 MG tablet Take 1 tablet (4 mg total) by mouth daily. 90 tablet 3  . hydrALAZINE (APRESOLINE) 25 MG tablet Take 1 tablet (25 mg total) by mouth 3 (three) times daily. 60 tablet 5  . pantoprazole (PROTONIX) 40 MG tablet Take 1 tablet (40 mg total) by mouth daily. 30 tablet 11  . tamsulosin (FLOMAX) 0.4 MG CAPS capsule Take 1 capsules by mouth once daily. NEEDS OFFICE VISIT 90 capsule 0  . triamcinolone (KENALOG) 0.1 % Apply 1 application topically 2 (two) times daily. To affected (rash) areas 30 g 1  . valsartan (DIOVAN) 320 MG tablet TAKE 1 TABLET BY MOUTH EVERY DAY 90 tablet 1   No current facility-administered medications  for this visit.    No Known Allergies  Past Medical History:  Diagnosis Date  . BPH (benign prostatic hyperplasia)   . CAD (coronary artery disease)    cardiolite ok 3/05, stress test- low risk study 11/06  . Chronic low back pain    with radiculopathy  . ED (erectile dysfunction)   . Gallstones    abd [place]- stable hamangioma, gallbladder sludge and tiny stones 09/2003  . GERD (gastroesophageal reflux disease)   . Hyperlipidemia   . Hypertension   . Transfusion history     Blood pressure (!) 184/86, pulse 68, weight 187 lb (84.8 kg).  Essential hypertension Patient with essential hypertension, pressure increased since stopping amlodipine, despite increase in doxazosin.  Reviewed option of re-starting amlodipine, as his itching and swelling are not related to this.  Patient still hesitant, as he has had friends also have edema with amlodipine and this makes him nervous.  Instead will start hydralazine 25 mg twice daily.  He will need to continue with regular home BP monitoring and has a follow up visit with Dr. Gwenlyn Farrell later this month. We can see him after that as needed.    Jerry Farrell PharmD CPP [facility] [address] [place], [place] [address] [phone]

## 2020-05-25 NOTE — Assessment & Plan Note (Signed)
Patient with essential hypertension, currently not at goal.  Will have him discontinue amlodipine and increase doxazosin to 4 mg each night.  He is to continue with home monitoring and we will see him back in the office in 3-4 weeks for follow up.

## 2020-05-25 NOTE — Assessment & Plan Note (Signed)
Patient with essential hypertension, pressure increased since stopping amlodipine, despite increase in doxazosin.  Reviewed option of re-starting amlodipine, as his itching and swelling are not related to this.  Patient still hesitant, as he has had friends also have edema with amlodipine and this makes him nervous.  Instead will start hydralazine 25 mg twice daily.  He will need to continue with regular home BP monitoring and has a follow up visit with Dr. Gwenlyn Found later this month. We can see him after that as needed.

## 2020-05-28 DIAGNOSIS — H52223 Regular astigmatism, bilateral: Secondary | ICD-10-CM | POA: Diagnosis not present

## 2020-05-28 DIAGNOSIS — H0288A Meibomian gland dysfunction right eye, upper and lower eyelids: Secondary | ICD-10-CM | POA: Diagnosis not present

## 2020-05-28 DIAGNOSIS — H5202 Hypermetropia, left eye: Secondary | ICD-10-CM | POA: Diagnosis not present

## 2020-05-28 DIAGNOSIS — H40051 Ocular hypertension, right eye: Secondary | ICD-10-CM | POA: Diagnosis not present

## 2020-06-18 ENCOUNTER — Ambulatory Visit: Payer: Medicare Other | Admitting: Cardiovascular Disease

## 2020-06-18 ENCOUNTER — Encounter: Payer: Self-pay | Admitting: Cardiovascular Disease

## 2020-06-18 ENCOUNTER — Other Ambulatory Visit: Payer: Self-pay

## 2020-06-18 DIAGNOSIS — Z951 Presence of aortocoronary bypass graft: Secondary | ICD-10-CM | POA: Diagnosis not present

## 2020-06-18 DIAGNOSIS — I10 Essential (primary) hypertension: Secondary | ICD-10-CM | POA: Diagnosis not present

## 2020-06-18 DIAGNOSIS — E785 Hyperlipidemia, unspecified: Secondary | ICD-10-CM | POA: Diagnosis not present

## 2020-06-18 NOTE — Patient Instructions (Signed)

## 2020-06-18 NOTE — Progress Notes (Signed)
06/18/2020 Jerry Farrell   [birth year]  [id number]  Primary Physician Tower, Wynelle Fanny, MD Primary Cardiologist: Lorretta Harp MD [name], [place]  HPI:  Jerry Farrell is a 75 y.o.  thin-appearing married African-American male father of 37, grandfather and 5 grandchildren who is referred back to me by Dr. Glori Bickers, his PCP, to be reestablished in my practice.I last saw him in the office  06/17/2019.His risk factors include treated hypertension and hyperlipidemia. There is is a family history of a brother who recently had bypass surgery. He has never smoked. Retired from [employer]. He had coronary artery bypass graftingX 1in 1994 for what sounds like left main disease. He denies chest pain or shortness of breath.He did see how main in the office 06/02/16 after having had a Myoview stress test 04/11/16 that was essentially normal with no evidence of ischemia.   Since I saw him a year ago he continues to do well.  He has no symptoms of chest pain or shortness of breath.  We have been seeing him for treatment of hypertension in our Pharm.D. clinic Daijanae Rafalski suspect he has an element of "whitecoat hypertension.  His most recent lipid profile performed 05/14/2019 revealed total cholesterol of 125, LDL 54 and HDL 44.    Current Meds  Medication Sig  . amLODipine (NORVASC) 2.5 MG tablet Take 2.5 mg by mouth daily.  Marland Kitchen aspirin EC 81 MG tablet Take 1 tablet (81 mg total) by mouth daily.  Marland Kitchen atorvastatin (LIPITOR) 10 MG tablet TAKE 1 TABLET BY MOUTH EVERY DAY  . carvedilol (COREG) 3.125 MG tablet TAKE 1 TABLET BY MOUTH 2 TIMES DAILY WITH A MEAL.  Marland Kitchen dicyclomine (BENTYL) 10 MG capsule TAKE 1 CAPSULE (10 MG TOTAL) BY MOUTH 2 (TWO) TIMES DAILY AS NEEDED FOR SPASMS.  Marland Kitchen doxazosin (CARDURA) 4 MG tablet Take 1 tablet (4 mg total) by mouth daily.  . hydrALAZINE (APRESOLINE) 25 MG tablet Take 1 tablet (25 mg total) by mouth 3 (three) times daily.  . pantoprazole (PROTONIX) 40 MG tablet Take 1 tablet (40 mg  total) by mouth daily.  . tamsulosin (FLOMAX) 0.4 MG CAPS capsule Take 1 capsules by mouth once daily. NEEDS OFFICE VISIT  . triamcinolone (KENALOG) 0.1 % Apply 1 application topically 2 (two) times daily. To affected (rash) areas  . valsartan (DIOVAN) 320 MG tablet TAKE 1 TABLET BY MOUTH EVERY DAY     No Known Allergies  Social History   Socioeconomic History  . Marital status: Married    Spouse name: Not on file  . Number of children: 3  . Years of education: Not on file  . Highest education level: Not on file  Occupational History  . Occupation: [employer]    Employer: RETIRED  Tobacco Use  . Smoking status: Never Smoker  . Smokeless tobacco: Never Used  Vaping Use  . Vaping Use: Never used  Substance and Sexual Activity  . Alcohol use: No    Alcohol/week: 0.0 standard drinks  . Drug use: No  . Sexual activity: Yes  Other Topics Concern  . Not on file  Social History Narrative  . Not on file   Social Determinants of Health   Financial Resource Strain: Not on file  Food Insecurity: Not on file  Transportation Needs: Not on file  Physical Activity: Not on file  Stress: Not on file  Social Connections: Not on file  Intimate Partner Violence: Not on file     Review of Systems:  General: negative for chills, fever, night sweats or weight changes.  Cardiovascular: negative for chest pain, dyspnea on exertion, edema, orthopnea, palpitations, paroxysmal nocturnal dyspnea or shortness of breath Dermatological: negative for rash Respiratory: negative for cough or wheezing Urologic: negative for hematuria Abdominal: negative for nausea, vomiting, diarrhea, bright red blood per rectum, melena, or hematemesis Neurologic: negative for visual changes, syncope, or dizziness All other systems reviewed and are otherwise negative except as noted above.    Blood pressure (!) 172/88, pulse 65, height 5\' 8"  (1.727 m), weight 189 lb 6.4 oz (85.9 kg), SpO2 99 %.  General  appearance: alert and no distress Neck: no adenopathy, no carotid bruit, no JVD, supple, symmetrical, trachea midline and thyroid not enlarged, symmetric, no tenderness/mass/nodules Lungs: clear to auscultation bilaterally Heart: regular rate and rhythm, S1, S2 normal, no murmur, click, rub or gallop Extremities: extremities normal, atraumatic, no cyanosis or edema Pulses: 2+ and symmetric Skin: Skin color, texture, turgor normal. No rashes or lesions Neurologic: Alert and oriented X 3, normal strength and tone. Normal symmetric reflexes. Normal coordination and gait  EKG sinus rhythm at 65 with anterolateral T wave inversion.  This is unchanged from prior EKGs.  I personally reviewed this EKG.  ASSESSMENT AND PLAN:   Dyslipidemia, goal LDL below 70 History of hyperlipidemia on statin therapy with lipid profile performed 05/14/2019 revealing total cholesterol 125, LDL 54 and HDL 44.  We will recheck a lipid liver profile this morning.  Essential hypertension History of essential hypertension a blood pressure measured today at 172/88.  He does check his blood pressure at home and it is usually much better than this.  This morning at home it was 133/72.  He is on amlodipine, carvedilol and hydralazine.  I suspect he has an element of "whitecoat hypertension".  Hx of CABG History of CAD status post CABG x1 in 1994 for what sounds like left main disease.  He had a Myoview stress test performed 04/11/2016 which was essentially normal.  He denies chest pain or shortness of breath.      Lorretta Harp MD FACP,FACC,FAHA, [facility] 06/18/2020 9:58 AM

## 2020-06-18 NOTE — Assessment & Plan Note (Signed)
History of hyperlipidemia on statin therapy with lipid profile performed 05/14/2019 revealing total cholesterol 125, LDL 54 and HDL 44.  We will recheck a lipid liver profile this morning.

## 2020-06-18 NOTE — Assessment & Plan Note (Signed)
History of essential hypertension a blood pressure measured today at 172/88.  He does check his blood pressure at home and it is usually much better than this.  This morning at home it was 133/72.  He is on amlodipine, carvedilol and hydralazine.  I suspect he has an element of "whitecoat hypertension".

## 2020-06-18 NOTE — Assessment & Plan Note (Signed)
History of CAD status post CABG x1 in 1994 for what sounds like left main disease.  He had a Myoview stress test performed 04/11/2016 which was essentially normal.  He denies chest pain or shortness of breath.

## 2020-06-19 LAB — LIPID PANEL
Chol/HDL Ratio: 2.5 ratio (ref 0.0–5.0)
Cholesterol, Total: 148 mg/dL (ref 100–199)
HDL: 59 mg/dL (ref >39)
LDL Chol Calc (NIH): 69 mg/dL (ref 0–99)
Triglycerides: 114 mg/dL (ref 0–149)
VLDL Cholesterol Cal: 20 mg/dL (ref 5–40)

## 2020-06-19 LAB — HEPATIC FUNCTION PANEL
ALT: 37 IU/L (ref 0–44)
AST: 23 IU/L (ref 0–40)
Albumin: 4.6 g/dL (ref 3.7–4.7)
Alkaline Phosphatase: 161 IU/L — ABNORMAL HIGH (ref 44–121)
Bilirubin Total: 0.9 mg/dL (ref 0.0–1.2)
Bilirubin, Direct: 0.24 mg/dL (ref 0.00–0.40)
Total Protein: 7 g/dL (ref 6.0–8.5)

## 2020-06-22 ENCOUNTER — Other Ambulatory Visit: Payer: Self-pay | Admitting: Family Medicine

## 2020-06-23 ENCOUNTER — Other Ambulatory Visit: Payer: Self-pay

## 2020-06-23 ENCOUNTER — Ambulatory Visit (INDEPENDENT_AMBULATORY_CARE_PROVIDER_SITE_OTHER): Payer: Medicare Other

## 2020-06-23 ENCOUNTER — Telehealth: Payer: Self-pay

## 2020-06-23 DIAGNOSIS — Z0000 Encounter for general adult medical examination without abnormal findings: Secondary | ICD-10-CM

## 2020-06-23 MED ORDER — TRIAMCINOLONE ACETONIDE 0.1 % EX CREA: 1.0000 "application " | TOPICAL_CREAM | Freq: Two times a day (BID) | CUTANEOUS | 1 refills | Status: DC

## 2020-06-23 NOTE — Patient Instructions (Signed)
Jerry Farrell , Thank you for taking time to come for your Medicare Wellness Visit. I appreciate your ongoing commitment to your health goals. Please review the following plan we discussed and let me know if I can assist you in the future.   Screening recommendations/referrals: Colonoscopy: Up to date, completed 02/15/2016, due 01/2021 Recommended yearly ophthalmology/optometry visit for glaucoma screening and checkup Recommended yearly dental visit for hygiene and checkup  Vaccinations: Influenza vaccine: Up to date, completed 01/19/2020, due 11/2020 Pneumococcal vaccine: Completed series Tdap vaccine: decline-insurance  Shingles vaccine: due, check with your insurance regarding coverage if interested    Covid-19: Completed series  Advanced directives: Please bring a copy of your POA (Power of Attorney) and/or Living Will to your next appointment.   Conditions/risks identified: hyperlipidemia   Next appointment: Follow up in one year for your annual wellness visit.   Preventive Care 77 Years and Older, Male Preventive care refers to lifestyle choices and visits with your health care provider that can promote health and wellness. What does preventive care include?  A yearly physical exam. This is also called an annual well check.  Dental exams once or twice a year.  Routine eye exams. Ask your health care provider how often you should have your eyes checked.  Personal lifestyle choices, including:  Daily care of your teeth and gums.  Regular physical activity.  Eating a healthy diet.  Avoiding tobacco and drug use.  Limiting alcohol use.  Practicing safe sex.  Taking low doses of aspirin every day.  Taking vitamin and mineral supplements as recommended by your health care provider. What happens during an annual well check? The services and screenings done by your health care provider during your annual well check will depend on your age, overall health, lifestyle risk  factors, and family history of disease. Counseling  Your health care provider may ask you questions about your:  Alcohol use.  Tobacco use.  Drug use.  Emotional well-being.  Home and relationship well-being.  Sexual activity.  Eating habits.  History of falls.  Memory and ability to understand (cognition).  Work and work Statistician. Screening  You may have the following tests or measurements:  Height, weight, and BMI.  Blood pressure.  Lipid and cholesterol levels. These may be checked every 5 years, or more frequently if you are over 49 years old.  Skin check.  Lung cancer screening. You may have this screening every year starting at age 49 if you have a 30-pack-year history of smoking and currently smoke or have quit within the past 15 years.  Fecal occult blood test (FOBT) of the stool. You may have this test every year starting at age 45.  Flexible sigmoidoscopy or colonoscopy. You may have a sigmoidoscopy every 5 years or a colonoscopy every 10 years starting at age 19.  Prostate cancer screening. Recommendations will vary depending on your family history and other risks.  Hepatitis C blood test.  Hepatitis B blood test.  Sexually transmitted disease (STD) testing.  Diabetes screening. This is done by checking your blood sugar (glucose) after you have not eaten for a while (fasting). You may have this done every 1-3 years.  Abdominal aortic aneurysm (AAA) screening. You may need this if you are a current or former smoker.  Osteoporosis. You may be screened starting at age 62 if you are at high risk. Talk with your health care provider about your test results, treatment options, and if necessary, the need for more tests. Vaccines  Your health care provider may recommend certain vaccines, such as:  Influenza vaccine. This is recommended every year.  Tetanus, diphtheria, and acellular pertussis (Tdap, Td) vaccine. You may need a Td booster every 10  years.  Zoster vaccine. You may need this after age 65.  Pneumococcal 13-valent conjugate (PCV13) vaccine. One dose is recommended after age 14.  Pneumococcal polysaccharide (PPSV23) vaccine. One dose is recommended after age 45. Talk to your health care provider about which screenings and vaccines you need and how often you need them. This information is not intended to replace advice given to you by your health care provider. Make sure you discuss any questions you have with your health care provider. Document Released: 05/07/2015 Document Revised: 12/29/2015 Document Reviewed: 02/09/2015 Elsevier Interactive Patient Education  2017 [place] Prevention in the Home Falls can cause injuries. They can happen to people of all ages. There are many things you can do to make your home safe and to help prevent falls. What can I do on the outside of my home?  Regularly fix the edges of walkways and driveways and fix any cracks.  Remove anything that might make you trip as you walk through a door, such as a raised step or threshold.  Trim any bushes or trees on the path to your home.  Use bright outdoor lighting.  Clear any walking paths of anything that might make someone trip, such as rocks or tools.  Regularly check to see if handrails are loose or broken. Make sure that both sides of any steps have handrails.  Any raised decks and porches should have guardrails on the edges.  Have any leaves, snow, or ice cleared regularly.  Use sand or salt on walking paths during winter.  Clean up any spills in your garage right away. This includes oil or grease spills. What can I do in the bathroom?  Use night lights.  Install grab bars by the toilet and in the tub and shower. Do not use towel bars as grab bars.  Use non-skid mats or decals in the tub or shower.  If you need to sit down in the shower, use a plastic, non-slip stool.  Keep the floor dry. Clean up any water that  spills on the floor as soon as it happens.  Remove soap buildup in the tub or shower regularly.  Attach bath mats securely with double-sided non-slip rug tape.  Do not have throw rugs and other things on the floor that can make you trip. What can I do in the bedroom?  Use night lights.  Make sure that you have a light by your bed that is easy to reach.  Do not use any sheets or blankets that are too big for your bed. They should not hang down onto the floor.  Have a firm chair that has side arms. You can use this for support while you get dressed.  Do not have throw rugs and other things on the floor that can make you trip. What can I do in the kitchen?  Clean up any spills right away.  Avoid walking on wet floors.  Keep items that you use a lot in easy-to-reach places.  If you need to reach something above you, use a strong step stool that has a grab bar.  Keep electrical cords out of the way.  Do not use floor polish or wax that makes floors slippery. If you must use wax, use non-skid floor wax.  Do  not have throw rugs and other things on the floor that can make you trip. What can I do with my stairs?  Do not leave any items on the stairs.  Make sure that there are handrails on both sides of the stairs and use them. Fix handrails that are broken or loose. Make sure that handrails are as long as the stairways.  Check any carpeting to make sure that it is firmly attached to the stairs. Fix any carpet that is loose or worn.  Avoid having throw rugs at the top or bottom of the stairs. If you do have throw rugs, attach them to the floor with carpet tape.  Make sure that you have a light switch at the top of the stairs and the bottom of the stairs. If you do not have them, ask someone to add them for you. What else can I do to help prevent falls?  Wear shoes that:  Do not have high heels.  Have rubber bottoms.  Are comfortable and fit you well.  Are closed at the  toe. Do not wear sandals.  If you use a stepladder:  Make sure that it is fully opened. Do not climb a closed stepladder.  Make sure that both sides of the stepladder are locked into place.  Ask someone to hold it for you, if possible.  Clearly mark and make sure that you can see:  Any grab bars or handrails.  First and last steps.  Where the edge of each step is.  Use tools that help you move around (mobility aids) if they are needed. These include:  Canes.  Walkers.  Scooters.  Crutches.  Turn on the lights when you go into a dark area. Replace any light bulbs as soon as they burn out.  Set up your furniture so you have a clear path. Avoid moving your furniture around.  If any of your floors are uneven, fix them.  If there are any pets around you, be aware of where they are.  Review your medicines with your doctor. Some medicines can make you feel dizzy. This can increase your chance of falling. Ask your doctor what other things that you can do to help prevent falls. This information is not intended to replace advice given to you by your health care provider. Make sure you discuss any questions you have with your health care provider. Document Released: 02/04/2009 Document Revised: 09/16/2015 Document Reviewed: 05/15/2014 Elsevier Interactive Patient Education  2017 [employer].

## 2020-06-23 NOTE — Progress Notes (Signed)
Subjective:   Jerry Farrell is a 75 y.o. male who presents for Medicare Annual/Subsequent preventive examination.  Review of Systems: N/A      I connected with the patient today by telephone and verified that I am speaking with the correct person using two identifiers. Location patient: home Location nurse: work Persons participating in the telephone visit: patient, nurse.   I discussed the limitations, risks, security and privacy concerns of performing an evaluation and management service by telephone and the availability of in person appointments. I also discussed with the patient that there may be a patient responsible charge related to this service. The patient expressed understanding and verbally consented to this telephonic visit.        Cardiac Risk Factors include: advanced age (>84men, >50 women);male gender;Other (see comment), Risk factor comments: hyperlipidemia     Objective:    Today's Vitals   06/23/20 1436  PainSc: 0-No pain   There is no height or weight on file to calculate BMI.  Advanced Directives 06/23/2020 05/14/2019 05/08/2018 06/06/2016 06/06/2016 04/09/2016 01/26/2016  Does Patient Have a Medical Advance Directive? Yes No No No No No No  Type of Paramedic of [place];Living will - - - - - -  Copy of [place] in Chart? No - copy requested - - - - - -  Would patient like information on creating a medical advance directive? - No - Patient declined No - Patient declined No - Patient declined - - No - patient declined information    Current Medications (verified) Outpatient Encounter Medications as of 06/23/2020  Medication Sig  . amLODipine (NORVASC) 2.5 MG tablet Take 2.5 mg by mouth daily.  Marland Kitchen aspirin EC 81 MG tablet Take 1 tablet (81 mg total) by mouth daily.  Marland Kitchen atorvastatin (LIPITOR) 10 MG tablet TAKE 1 TABLET BY MOUTH EVERY DAY  . carvedilol (COREG) 3.125 MG tablet TAKE 1 TABLET BY MOUTH 2 TIMES DAILY WITH A  MEAL.  Marland Kitchen dicyclomine (BENTYL) 10 MG capsule TAKE 1 CAPSULE (10 MG TOTAL) BY MOUTH 2 (TWO) TIMES DAILY AS NEEDED FOR SPASMS.  Marland Kitchen doxazosin (CARDURA) 4 MG tablet Take 1 tablet (4 mg total) by mouth daily.  . hydrALAZINE (APRESOLINE) 25 MG tablet Take 1 tablet (25 mg total) by mouth 3 (three) times daily.  . pantoprazole (PROTONIX) 40 MG tablet Take 1 tablet (40 mg total) by mouth daily.  . tamsulosin (FLOMAX) 0.4 MG CAPS capsule Take 1 capsules by mouth once daily. NEEDS OFFICE VISIT  . triamcinolone (KENALOG) 0.1 % Apply 1 application topically 2 (two) times daily. To affected (rash) areas  . valsartan (DIOVAN) 320 MG tablet TAKE 1 TABLET BY MOUTH EVERY DAY   No facility-administered encounter medications on file as of 06/23/2020.    Allergies (verified) Patient has no known allergies.   History: Past Medical History:  Diagnosis Date  . BPH (benign prostatic hyperplasia)   . CAD (coronary artery disease)    cardiolite ok 3/05, stress test- low risk study 11/06  . Chronic low back pain    with radiculopathy  . ED (erectile dysfunction)   . Gallstones    abd [place]- stable hamangioma, gallbladder sludge and tiny stones 09/2003  . GERD (gastroesophageal reflux disease)   . Hyperlipidemia   . Hypertension   . Transfusion history    Past Surgical History:  Procedure Laterality Date  . CATARACT EXTRACTION W/PHACO  03/13/2012   Procedure: CATARACT EXTRACTION PHACO AND INTRAOCULAR LENS PLACEMENT (  [place]);  Surgeon: Adonis Brook, MD;  Location: [place];  Service: Ophthalmology;  Laterality: Left;  . CORONARY ARTERY BYPASS GRAFT  1998   x 1  [facility]  . ESOPHAGOGASTRODUODENOSCOPY (EGD) WITH PROPOFOL N/A 06/07/2016   Procedure: ESOPHAGOGASTRODUODENOSCOPY (EGD) WITH PROPOFOL;  Surgeon: Mauri Pole, MD;  Location: [facility];  Service: Endoscopy;  Laterality: N/A;  . GASTRECTOMY     partial  . MEMBRANE PEEL  03/13/2012   Procedure: MEMBRANE PEEL;  Surgeon: Adonis Brook, MD;  Location:  [place];  Service: Ophthalmology;  Laterality: Left;  . PARS PLANA VITRECTOMY  03/13/2012   Procedure: PARS PLANA VITRECTOMY WITH 23 GAUGE;  Surgeon: Adonis Brook, MD;  Location: [place];  Service: Ophthalmology;  Laterality: Left;   Family History  Problem Relation Age of Onset  . Cancer Sister        unknown type, pt feels alcohol related  . Colon cancer Brother   . Throat cancer Brother        pt feels alcohol related  . Colon cancer Brother        1/2 brother  . Hypertension Mother   . Stomach cancer Neg Hx   . Pancreatic cancer Neg Hx    Social History   Socioeconomic History  . Marital status: Married    Spouse name: Not on file  . Number of children: 3  . Years of education: Not on file  . Highest education level: Not on file  Occupational History  . Occupation: [employer]    Employer: RETIRED  Tobacco Use  . Smoking status: Never Smoker  . Smokeless tobacco: Never Used  Vaping Use  . Vaping Use: Never used  Substance and Sexual Activity  . Alcohol use: No    Alcohol/week: 0.0 standard drinks  . Drug use: No  . Sexual activity: Yes  Other Topics Concern  . Not on file  Social History Narrative  . Not on file   Social Determinants of Health   Financial Resource Strain: Low Risk   . Difficulty of Paying Living Expenses: Not hard at all  Food Insecurity: No Food Insecurity  . Worried About Charity fundraiser in the Last Year: Never true  . Ran Out of Food in the Last Year: Never true  Transportation Needs: No Transportation Needs  . Lack of Transportation (Medical): No  . Lack of Transportation (Non-Medical): No  Physical Activity: Sufficiently Active  . Days of Exercise per Week: 3 days  . Minutes of Exercise per Session: 60 min  Stress: No Stress Concern Present  . Feeling of Stress : Not at all  Social Connections: Not on file    Tobacco Counseling Counseling given: Not Answered   Clinical Intake:  Pre-visit preparation completed: Yes  Pain :  No/denies pain Pain Score: 0-No pain     Nutritional Risks: None Diabetes: No  How often do you need to have someone help you when you read instructions, pamphlets, or other written materials from your doctor or pharmacy?: 1 - Never What is the last grade level you completed in school?: 12th  Diabetic: No Nutrition Risk Assessment:  Has the patient had any N/V/D within the last 2 months?  No  Does the patient have any non-healing wounds?  No  Has the patient had any unintentional weight loss or weight gain?  No   Diabetes:  Is the patient diabetic?  No  If diabetic, was a CBG obtained today?  N/A Did the patient  bring in their glucometer from home?  N/A How often do you monitor your CBG's? N/A.   Financial Strains and Diabetes Management:  Are you having any financial strains with the device, your supplies or your medication? N/A.  Does the patient want to be seen by Chronic Care Management for management of their diabetes?  N/A Would the patient like to be referred to a Nutritionist or for Diabetic Management?  N/A     Interpreter Needed?: No  Information entered by :: CJohnson, LPN   Activities of Daily Living In your present state of health, do you have any difficulty performing the following activities: 06/23/2020  Hearing? N  Vision? N  Difficulty concentrating or making decisions? N  Walking or climbing stairs? N  Dressing or bathing? N  Doing errands, shopping? N  Preparing Food and eating ? N  Using the Toilet? N  In the past six months, have you accidently leaked urine? N  Do you have problems with loss of bowel control? N  Managing your Medications? N  Managing your Finances? N  Housekeeping or managing your Housekeeping? N  Some recent data might be hidden    Patient Care Team: Tower, Wynelle Fanny, MD as PCP - General Gwenlyn Found Pearletha Forge, MD as PCP - Cardiology (Cardiology) Lorretta Harp, MD as Consulting Physician (Cardiology) Camillo Flaming, OD as  Consulting Physician (Optometry)  Indicate any recent Medical Services you may have received from other than [facility] providers in the past year (date may be approximate).     Assessment:   This is a routine wellness examination for [facility].  Hearing/Vision screen  Hearing Screening   125Hz  250Hz  500Hz  1000Hz  2000Hz  3000Hz  4000Hz  6000Hz  8000Hz   Right ear:           Left ear:           Vision Screening Comments: Patient gets annual eye exams  Dietary issues and exercise activities discussed: Current Exercise Habits: Home exercise routine, Type of exercise: walking;Other - see comments (bowling), Time (Minutes): 60, Frequency (Times/Week): 3, Weekly Exercise (Minutes/Week): 180, Exercise limited by: None identified  Goals    . Blood Pressure < 130/80    . Increase physical activity     Starting 05/08/2018, I will continue to bowl for at least 2 hours one day per week.     . Patient Stated     05/14/2019, I will maintain and continue medications as prescribed.     . Patient Stated     06/23/2020, I will continue to to bowl on Thursdays for 2 hours and walk 2 days a week for 30 minutes.       Depression Screen PHQ 2/9 Scores 06/23/2020 05/14/2019 05/08/2018 03/26/2017 01/26/2016 10/28/2014 08/19/2013  PHQ - 2 Score 0 0 0 0 0 0 0  PHQ- 9 Score 0 0 0 - - - -    Fall Risk Fall Risk  06/23/2020 05/14/2019 05/08/2018 03/26/2017 01/26/2016  Falls in the past year? 0 0 0 No No  Number falls in past yr: 0 0 - - -  Injury with Fall? 0 0 - - -  Risk for fall due to : Medication side effect Medication side effect - - -  Follow up Falls evaluation completed;Falls prevention discussed Falls evaluation completed;Falls prevention discussed - - -    FALL RISK PREVENTION PERTAINING TO THE HOME:  Any stairs in or around the home? Yes  If so, are there any without handrails? No  Home free of loose  throw rugs in walkways, pet beds, electrical cords, etc? Yes  Adequate lighting in your home to reduce risk of falls?  Yes   ASSISTIVE DEVICES UTILIZED TO PREVENT FALLS:  Life alert? No  Use of a cane, walker or w/c? No  Grab bars in the bathroom? No  Shower chair or bench in shower? No  Elevated toilet seat or a handicapped toilet? No   TIMED UP AND GO:  Was the test performed? N/A telephone visit .    Cognitive Function: MMSE - Mini Mental State Exam 06/23/2020 05/14/2019 05/08/2018 01/26/2016  Orientation to time 5 5 5 5   Orientation to Place 5 5 5 5   Registration 3 3 3 3   Attention/ Calculation 5 5 0 0  Recall 3 2 3 3   Language- name 2 objects - - 0 0  Language- repeat 1 1 1 1   Language- follow 3 step command - - 3 3  Language- read & follow direction - - 0 0  Write a sentence - - 0 0  Copy design - - 0 0  Total score - - 20 20  Mini Cog  Mini-Cog screen was completed. Maximum score is 22. A value of 0 denotes this part of the MMSE was not completed or the patient failed this part of the Mini-Cog screening.       Immunizations Immunization History  Administered Date(s) Administered  . Fluad Quad(high Dose 65+) 01/01/2019, 01/19/2020  . Influenza Split 03/17/2011, 04/30/2012  . Influenza, High Dose Seasonal PF 02/20/2018  . Influenza,inj,Quad PF,6+ Mos 04/08/2013, 05/28/2015, 01/26/2016, 03/26/2017  . Influenza-Unspecified 02/22/2014  . PFIZER(Purple Top)SARS-COV-2 Vaccination 05/15/2019, 06/05/2019, 01/28/2020  . Pneumococcal Conjugate-13 10/28/2014  . Pneumococcal Polysaccharide-23 04/30/2012  . Td 08/22/1997, 11/26/2007  . Zoster 08/19/2013    TDAP status: Due, Education has been provided regarding the importance of this vaccine. Advised may receive this vaccine at local pharmacy or Health Dept. Aware to provide a copy of the vaccination record if obtained from local pharmacy or Health Dept. Verbalized acceptance and understanding.  Flu Vaccine status: Up to date  Pneumococcal vaccine status: Up to date  Covid-19 vaccine status: Completed vaccines  Qualifies for Shingles  Vaccine? Yes   Zostavax completed Yes   Shingrix Completed?: No.    Education has been provided regarding the importance of this vaccine. Patient has been advised to call insurance company to determine out of pocket expense if they have not yet received this vaccine. Advised may also receive vaccine at local pharmacy or Health Dept. Verbalized acceptance and understanding.  Screening Tests Health Maintenance  Topic Date Due  . TETANUS/TDAP  06/24/2023 (Originally 11/25/2017)  . COLONOSCOPY (Pts 45-71yrs Insurance coverage will need to be confirmed)  02/14/2021  . INFLUENZA VACCINE  Completed  . COVID-19 Vaccine  Completed  . Hepatitis C Screening  Completed  . PNA vac Low Risk Adult  Completed  . HPV VACCINES  Aged Out    Health Maintenance  There are no preventive care reminders to display for this patient.  Colorectal cancer screening: Type of screening: Colonoscopy. Completed 02/15/2016. Repeat every 5 years  Lung Cancer Screening: (Low Dose CT Chest recommended if Age 69-80 years, 30 pack-year currently smoking OR have quit w/in 15years.) does not qualify.  Additional Screening:  Hepatitis C Screening: does qualify; Completed 01/26/2016   Vision Screening: Recommended annual ophthalmology exams for early detection of glaucoma and other disorders of the eye. Is the patient up to date with their annual eye exam?  Yes  Who is the provider or what is the name of the office in which the patient attends annual eye exams? Dr. Jaclyn Prime If pt is not established with a provider, would they like to be referred to a provider to establish care? No .   Dental Screening: Recommended annual dental exams for proper oral hygiene  Community Resource Referral / Chronic Care Management: CRR required this visit?  No   CCM required this visit?  No      Plan:     I have personally reviewed and noted the following in the patient's chart:   . Medical and social history . Use of alcohol,  tobacco or illicit drugs  . Current medications and supplements . Functional ability and status . Nutritional status . Physical activity . Advanced directives . List of other physicians . Hospitalizations, surgeries, and ER visits in previous 12 months . Vitals . Screenings to include cognitive, depression, and falls . Referrals and appointments  In addition, I have reviewed and discussed with patient certain preventive protocols, quality metrics, and best practice recommendations. A written personalized care plan for preventive services as well as general preventive health recommendations were provided to patient.   Due to this being a telephonic visit, the after visit summary with patients personalized plan was offered to patient via office or my-chart. Patient preferred to pick up at office at next visit or via mychart.   Andrez Grime, LPN   1/0/2725

## 2020-06-23 NOTE — Progress Notes (Signed)
PCP notes:  Health Maintenance: Tdap- insurance   Abnormal Screenings: none   Patient concerns: none   Nurse concerns: none   Next PCP appt.: none

## 2020-06-23 NOTE — Addendum Note (Signed)
Addended by: [name] A on: 06/23/2020 04:32 PM   Modules accepted: Orders

## 2020-06-23 NOTE — Telephone Encounter (Signed)
I sent it  

## 2020-06-23 NOTE — Telephone Encounter (Signed)
Patient wants a refill on his triamcinolone cream sent to [facility] on [address].

## 2020-07-08 ENCOUNTER — Other Ambulatory Visit: Payer: Self-pay | Admitting: Physician Assistant

## 2020-08-03 ENCOUNTER — Other Ambulatory Visit: Payer: Self-pay | Admitting: Cardiovascular Disease

## 2020-08-03 ENCOUNTER — Other Ambulatory Visit: Payer: Self-pay | Admitting: Family Medicine

## 2020-08-04 NOTE — Telephone Encounter (Signed)
Please schedule an annual exam this summer

## 2020-08-04 NOTE — Telephone Encounter (Signed)
Pt's had a recent acute appt but no recent or future f/u or CPE appts.

## 2020-08-05 NOTE — Telephone Encounter (Signed)
Med refilled once and Carrie will reach out to pt to try and get appt scheduled  

## 2020-08-11 IMAGING — US US ABDOMEN COMPLETE
1 series · 13 of 25 positions shown · non-contrast
Comparison: 11/22/2005

CLINICAL DATA: Upper abdominal pain. Gas. Bloating. Prior
gastrectomy. Gastroesophageal reflux disease.

EXAM:
ABDOMEN ULTRASOUND COMPLETE

[Series 1: us abdomen complete · 13 of 132 slices shown]
[im 1/132]
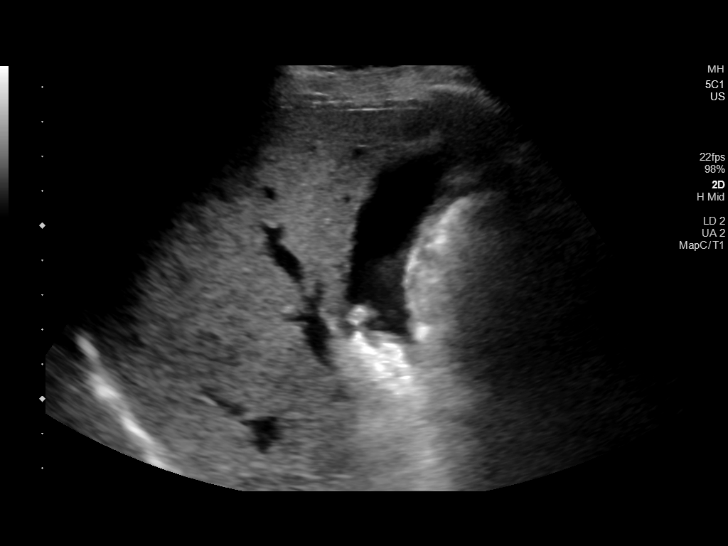
[im 11/132]
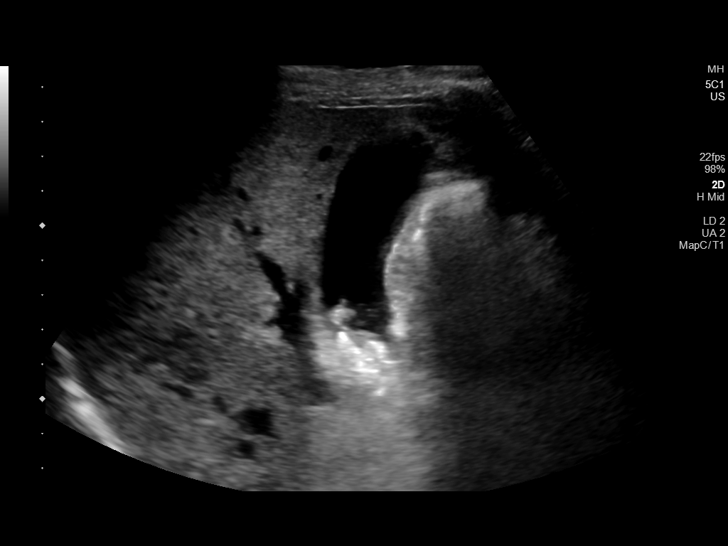
[im 22/132]
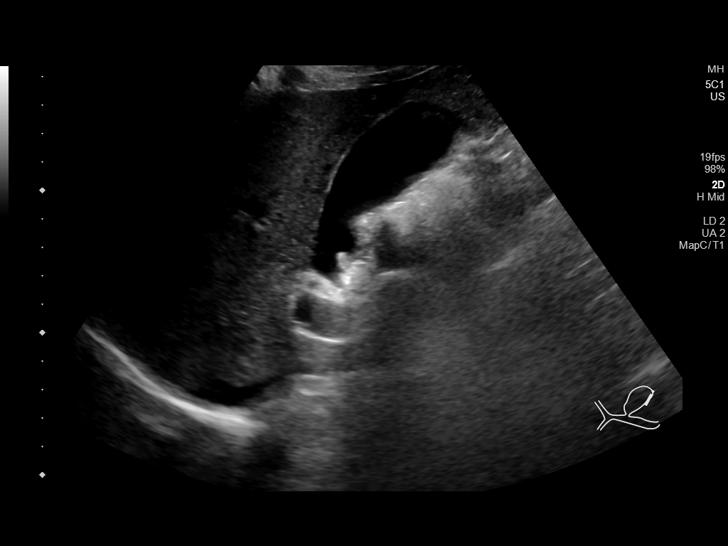
[im 33/132]
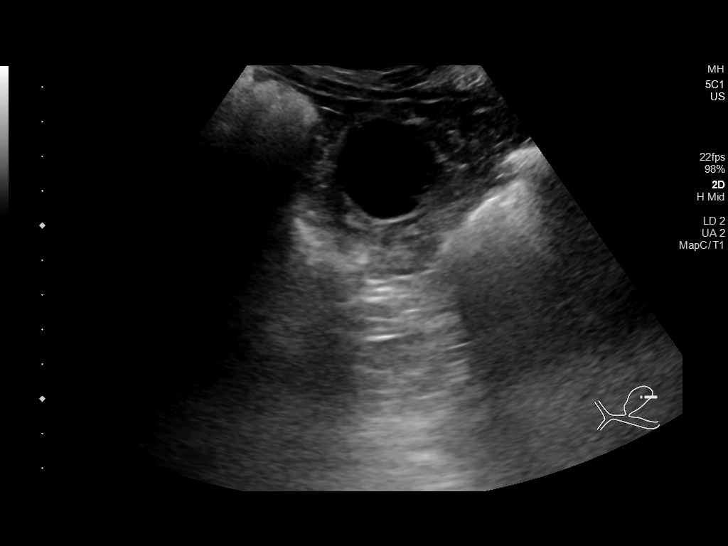
[im 44/132]
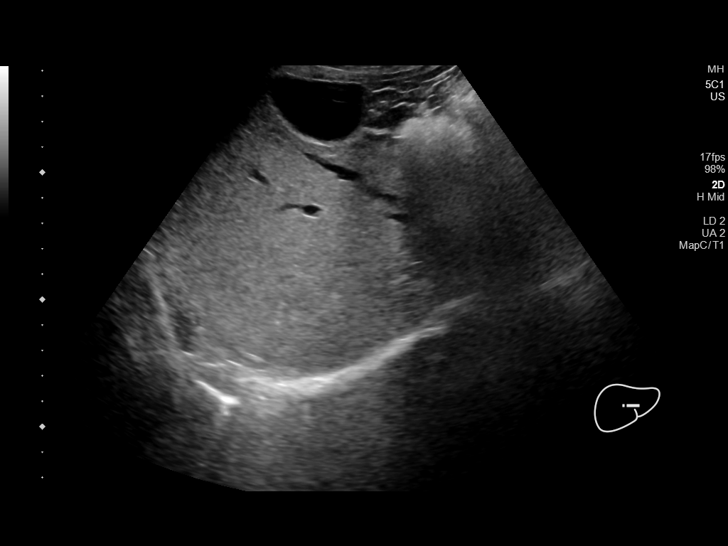
[im 55/132]
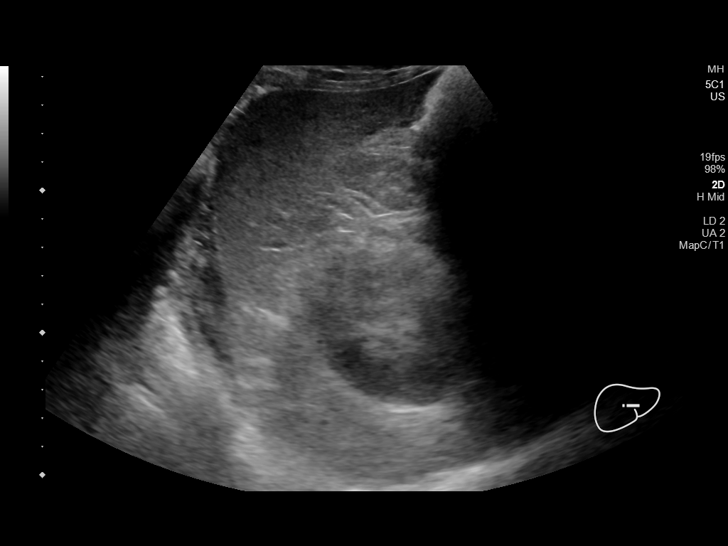
[im 66/132]
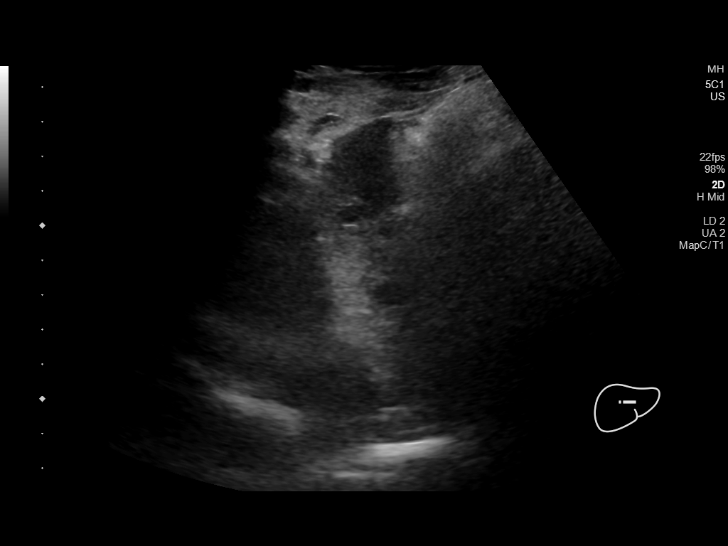
[im 77/132]
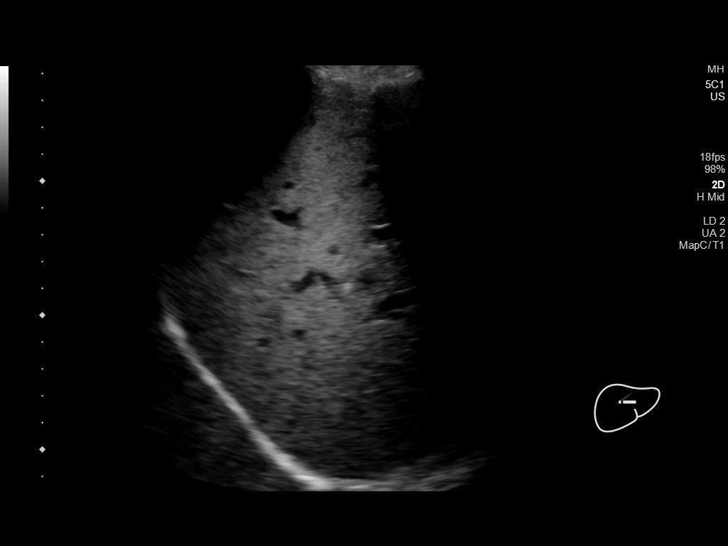
[im 88/132]
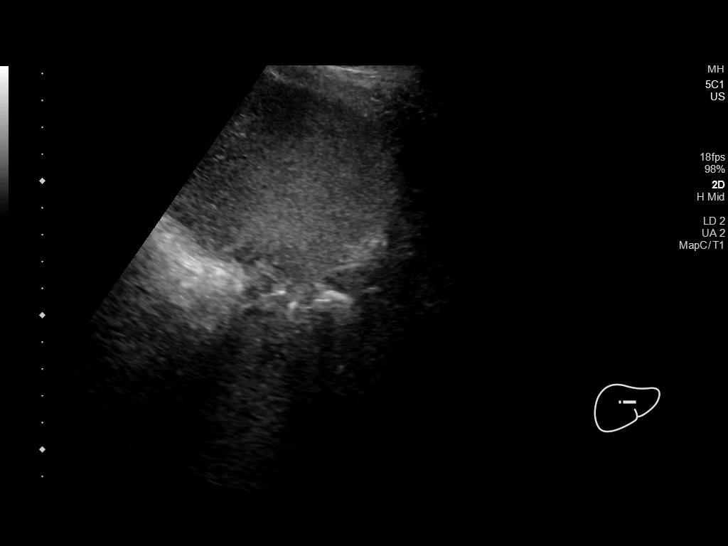
[im 99/132]
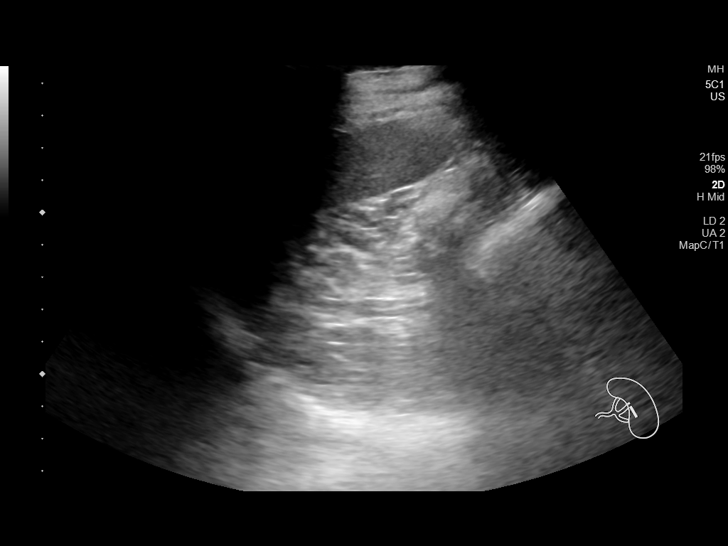
[im 110/132]
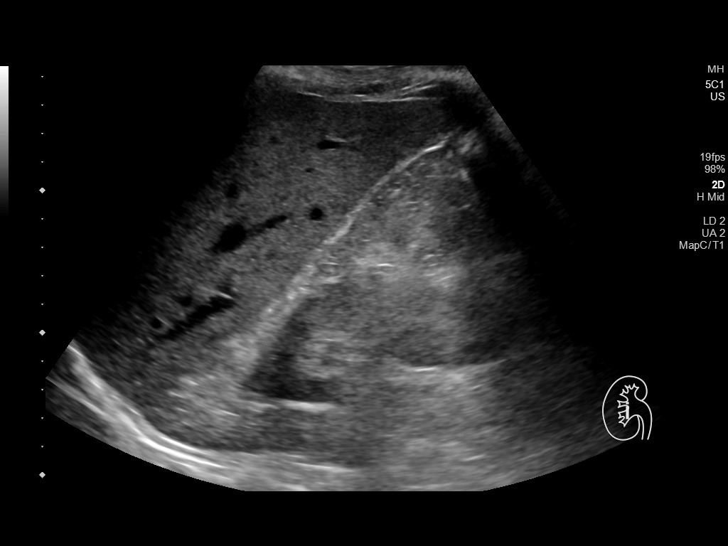
[im 121/132]
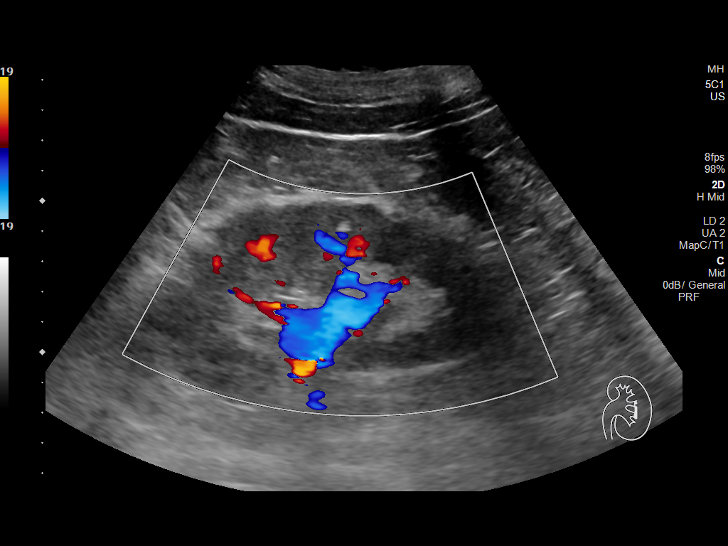
[im 132/132]
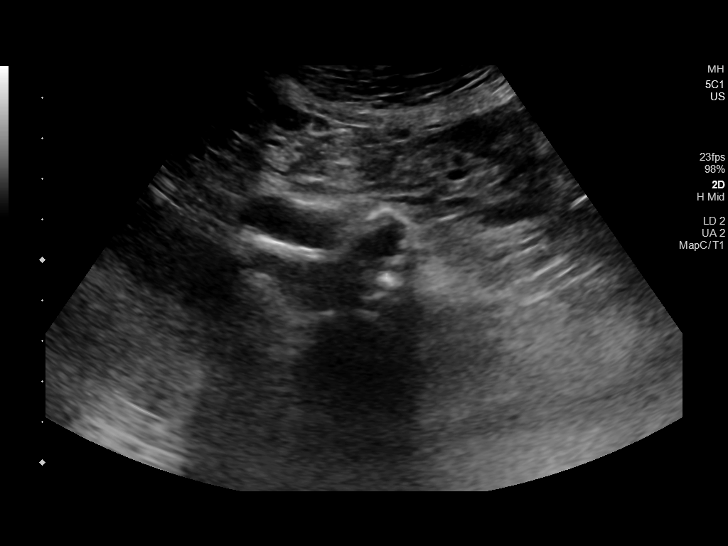

[13 of 25 positions shown; findings below may reference images not displayed]

FINDINGS: Gallbladder: Gallstones of up to 8 mm. No wall thickening or
pericholecystic fluid. Sonographic Murphy's sign was not elicited.

Common bile duct: Diameter: Normal, 4 mm.

Liver: Adjacent right hepatic lobe lesions are favored to represent
cysts. Examples at 1.7 cm on images 59 and 61. Portal vein is patent
on color Doppler imaging with normal direction of blood flow towards
the liver.

IVC: No abnormality visualized.

Pancreas: Visualized portion unremarkable.

Spleen: Size and appearance within normal limits.

Right Kidney: Length: 11.5 cm. Echogenicity within normal limits. No
mass or hydronephrosis visualized.

Left Kidney: Length: 11.5 cm. Echogenicity within normal limits. No
mass or hydronephrosis visualized.

Abdominal aorta: No aneurysm visualized.

Other findings: No ascites.
IMPRESSION: 1. Cholelithiasis without acute cholecystitis or biliary duct
dilatation.
2. Hepatic lesions which are likely cysts. Of note, no prior cysts
are seen on the remote CT of 2225. If there is any history of
primary malignancy such that hepatic metastasis would be a concern,
consider dedicated pre and post contrast abdominal MRI.

## 2020-08-27 DIAGNOSIS — H40051 Ocular hypertension, right eye: Secondary | ICD-10-CM | POA: Diagnosis not present

## 2020-09-29 ENCOUNTER — Telehealth: Payer: Self-pay | Admitting: Family Medicine

## 2020-09-29 DIAGNOSIS — R7303 Prediabetes: Secondary | ICD-10-CM

## 2020-09-29 DIAGNOSIS — E538 Deficiency of other specified B group vitamins: Secondary | ICD-10-CM

## 2020-09-29 DIAGNOSIS — E785 Hyperlipidemia, unspecified: Secondary | ICD-10-CM

## 2020-09-29 DIAGNOSIS — I10 Essential (primary) hypertension: Secondary | ICD-10-CM

## 2020-09-29 DIAGNOSIS — N400 Enlarged prostate without lower urinary tract symptoms: Secondary | ICD-10-CM

## 2020-09-29 DIAGNOSIS — D509 Iron deficiency anemia, unspecified: Secondary | ICD-10-CM

## 2020-09-29 NOTE — Telephone Encounter (Signed)
-----   Message from Cloyd Stagers, RT sent at 09/13/2020 12:38 PM EDT ----- Regarding: Lab Orders for Thursday 6.9.2022 Please place lab orders for Thursday 6.9.2022, office visit for physical on Thursday 6.16.2022 Thank you, Dyke Maes RT(R)

## 2020-09-30 ENCOUNTER — Other Ambulatory Visit (INDEPENDENT_AMBULATORY_CARE_PROVIDER_SITE_OTHER): Payer: Medicare Other

## 2020-09-30 ENCOUNTER — Other Ambulatory Visit: Payer: Self-pay

## 2020-09-30 DIAGNOSIS — E538 Deficiency of other specified B group vitamins: Secondary | ICD-10-CM

## 2020-09-30 DIAGNOSIS — D509 Iron deficiency anemia, unspecified: Secondary | ICD-10-CM | POA: Diagnosis not present

## 2020-09-30 DIAGNOSIS — I10 Essential (primary) hypertension: Secondary | ICD-10-CM

## 2020-09-30 DIAGNOSIS — R7303 Prediabetes: Secondary | ICD-10-CM

## 2020-09-30 DIAGNOSIS — E785 Hyperlipidemia, unspecified: Secondary | ICD-10-CM

## 2020-09-30 LAB — CBC WITH DIFFERENTIAL/PLATELET
Basophils Absolute: 0 10*3/uL (ref 0.0–0.1)
Basophils Relative: 0.4 % (ref 0.0–3.0)
Eosinophils Absolute: 0.1 10*3/uL (ref 0.0–0.7)
Eosinophils Relative: 1.5 % (ref 0.0–5.0)
HCT: 45.2 % (ref 39.0–52.0)
Hemoglobin: 15.4 g/dL (ref 13.0–17.0)
Lymphocytes Relative: 19.4 % (ref 12.0–46.0)
Lymphs Abs: 1.3 10*3/uL (ref 0.7–4.0)
MCHC: 34.1 g/dL (ref 30.0–36.0)
MCV: 87.3 fl (ref 78.0–100.0)
Monocytes Absolute: 0.5 10*3/uL (ref 0.1–1.0)
Monocytes Relative: 7.6 % (ref 3.0–12.0)
Neutro Abs: 4.9 10*3/uL (ref 1.4–7.7)
Neutrophils Relative %: 71.1 % (ref 43.0–77.0)
Platelets: 136 10*3/uL — ABNORMAL LOW (ref 150.0–400.0)
RBC: 5.17 Mil/uL (ref 4.22–5.81)
RDW: 13.6 % (ref 11.5–15.5)
WBC: 6.9 10*3/uL (ref 4.0–10.5)

## 2020-09-30 LAB — HEMOGLOBIN A1C: Hgb A1c MFr Bld: 5.7 % (ref 4.6–6.5)

## 2020-09-30 LAB — COMPREHENSIVE METABOLIC PANEL
ALT: 22 U/L (ref 0–53)
AST: 18 U/L (ref 0–37)
Albumin: 4.3 g/dL (ref 3.5–5.2)
Alkaline Phosphatase: 121 U/L — ABNORMAL HIGH (ref 39–117)
BUN: 15 mg/dL (ref 6–23)
CO2: 29 mEq/L (ref 19–32)
Calcium: 8.9 mg/dL (ref 8.4–10.5)
Chloride: 105 mEq/L (ref 96–112)
Creatinine, Ser: 0.99 mg/dL (ref 0.40–1.50)
GFR: 73.62 mL/min (ref >60.00)
Glucose, Bld: 95 mg/dL (ref 70–99)
Potassium: 4.1 mEq/L (ref 3.5–5.1)
Sodium: 141 mEq/L (ref 135–145)
Total Bilirubin: 1.1 mg/dL (ref 0.2–1.2)
Total Protein: 6.7 g/dL (ref 6.0–8.3)

## 2020-09-30 LAB — LIPID PANEL
Cholesterol: 138 mg/dL (ref 0–200)
HDL: 46.8 mg/dL (ref >39.00)
LDL Cholesterol: 65 mg/dL (ref 0–99)
NonHDL: 90.8
Total CHOL/HDL Ratio: 3
Triglycerides: 130 mg/dL (ref 0.0–149.0)
VLDL: 26 mg/dL (ref 0.0–40.0)

## 2020-09-30 LAB — FERRITIN: Ferritin: 53.8 ng/mL (ref 22.0–322.0)

## 2020-09-30 LAB — TSH: TSH: 0.62 u[IU]/mL (ref 0.35–4.50)

## 2020-09-30 LAB — VITAMIN B12: Vitamin B-12: 349 pg/mL (ref 211–911)

## 2020-10-07 ENCOUNTER — Ambulatory Visit (INDEPENDENT_AMBULATORY_CARE_PROVIDER_SITE_OTHER): Payer: Medicare Other | Admitting: Family Medicine

## 2020-10-07 ENCOUNTER — Encounter: Payer: Self-pay | Admitting: Family Medicine

## 2020-10-07 ENCOUNTER — Other Ambulatory Visit: Payer: Self-pay

## 2020-10-07 VITALS — BP 136/72 | HR 80 | Temp 97.4°F | Ht 68.0 in | Wt 184.0 lb

## 2020-10-07 DIAGNOSIS — D509 Iron deficiency anemia, unspecified: Secondary | ICD-10-CM

## 2020-10-07 DIAGNOSIS — N400 Enlarged prostate without lower urinary tract symptoms: Secondary | ICD-10-CM

## 2020-10-07 DIAGNOSIS — K2100 Gastro-esophageal reflux disease with esophagitis, without bleeding: Secondary | ICD-10-CM

## 2020-10-07 DIAGNOSIS — Z0000 Encounter for general adult medical examination without abnormal findings: Secondary | ICD-10-CM | POA: Diagnosis not present

## 2020-10-07 DIAGNOSIS — E538 Deficiency of other specified B group vitamins: Secondary | ICD-10-CM | POA: Diagnosis not present

## 2020-10-07 DIAGNOSIS — I10 Essential (primary) hypertension: Secondary | ICD-10-CM | POA: Diagnosis not present

## 2020-10-07 DIAGNOSIS — R7303 Prediabetes: Secondary | ICD-10-CM

## 2020-10-07 DIAGNOSIS — E785 Hyperlipidemia, unspecified: Secondary | ICD-10-CM

## 2020-10-07 DIAGNOSIS — D696 Thrombocytopenia, unspecified: Secondary | ICD-10-CM | POA: Diagnosis not present

## 2020-10-07 NOTE — Assessment & Plan Note (Signed)
Reviewed health habits including diet and exercise and skin cancer prevention Reviewed appropriate screening tests for age  Also reviewed health mt list, fam hx and immunization status , as well as social and family history   See HPI Labs reviewed  Enc more exercise Discussed shingrix vaccine and will check on coverage  Colonoscopy is due for 5 y recall in October (fam hx of colon cancer) Continues urology f/u for prostate health  Rev amw from march

## 2020-10-07 NOTE — Patient Instructions (Addendum)
Please add 30 minutes of exercise daily  Walking is fine  Some resistance training with weights or push ups/sit ups  This will help bring up the HDL /good cholesterol   If you are interested in the new shingles vaccine (Shingrix) - call your local pharmacy to check on coverage and availability  If affordable, get on a wait list at your pharmacy to get the vaccine.  It looks like you will need a colonoscopy in October  If you don't get a reminder let us know   You may due to follow up with urology (? Last visit 4/21) unless I did not get your last note   I want to re check your blood count with iron in about a month   Eat a healthy diet Try to get most of your carbohydrates from produce (with the exception of white potatoes)  Eat less bread/pasta/rice/snack foods/cereals/sweets and other items from the middle of the grocery store (processed carbs) Avoid red meat/ fried foods/ egg yolks/ fatty breakfast meats/ butter, cheese and high fat dairy/ and shellfish    Move slow when you get up after bending over - your blood pressure medicine may make you dizzy (just take your time)  Stay hydrated in the heat

## 2020-10-07 NOTE — Assessment & Plan Note (Signed)
bp in fair control at this time  BP Readings from Last 1 Encounters:  10/07/20 136/72   No changes needed Most recent labs reviewed  Disc lifstyle change with low sodium diet and exercise  Under care of cardiology Plan to continue  Coreg 3.125 mg Cardura 4 mg daily  Hydralazine 25 mg tid Valsartan 320 mg daily  Amlodipine 5 mg daily   Adv to use caution/not stand up quickly

## 2020-10-07 NOTE — Assessment & Plan Note (Signed)
Lab Results  Component Value Date   [id number] 09/30/2020   Doing ok w/o supplementation right now

## 2020-10-07 NOTE — Assessment & Plan Note (Signed)
Continues protonix 40 mg daily  Past hx of GI bleed and gastrectomy as well

## 2020-10-07 NOTE — Progress Notes (Signed)
Subjective:    Patient ID: NHAT HEARNE, male    DOB: October 15, 1945, 75 y.o.   MRN: [id number]  This visit occurred during the SARS-CoV-2 public health emergency.  Safety protocols were in place, including screening questions prior to the visit, additional usage of staff PPE, and extensive cleaning of exam room while observing appropriate contact time as indicated for disinfecting solutions.   HPI Here for health maintenance exam and to review chronic medical problems    Wt Readings from Last 3 Encounters:  10/07/20 184 lb (83.5 kg)  06/18/20 189 lb 6.4 oz (85.9 kg)  05/25/20 187 lb (84.8 kg)   27.98 kg/m  Feels pretty good  About the same   Occ sob feeling when he bends down  Told his cardiologist and was ok with that   Not exercising  He push mows the lawn ? If interested in more than that Used to walk    Had amw in march   Zoster status - zostavax 2015   Covid immunized with booster  Utd pna and flu shots  Colonoscopy 10/17 with 5 y recall (due in oct) -brother had colon cancer   BPH Sees urology Takes flomax  They follow psa Doing pretty well with voiding-no changes  Nocturia occasional (if he drinks a lot of water)  HTN in setting of CAD bp is stable today  No cp or palpitations or headaches or edema  No side effects to medicines  BP Readings from Last 3 Encounters:  10/07/20 136/72  06/18/20 (!) 172/88  05/25/20 (!) 184/86     Coreg 3.125 mg Cardura 4 mg daily  Hydralazine 25 mg tid Valsartan 320 mg daily  Amlodipine 5 mg daily   Pulse Readings from Last 3 Encounters:  10/07/20 80  06/18/20 65  05/25/20 68     GERD Takes protonix 40 mg daily   Vit B12 def Lab Results  Component Value Date   VITAMINB12 349 09/30/2020   No supplementation now  Hyperlipidemia Lab Results  Component Value Date   CHOL 138 09/30/2020   CHOL 148 06/18/2020   CHOL 125 05/14/2019   Lab Results  Component Value Date   HDL 46.80 09/30/2020   HDL 59  06/18/2020   HDL 44.60 05/14/2019   Lab Results  Component Value Date   LDLCALC 65 09/30/2020   LDLCALC 69 06/18/2020   LDLCALC 54 05/14/2019   Lab Results  Component Value Date   TRIG 130.0 09/30/2020   TRIG 114 06/18/2020   TRIG 134.0 05/14/2019   Lab Results  Component Value Date   CHOLHDL 3 09/30/2020   CHOLHDL 2.5 06/18/2020   CHOLHDL 3 05/14/2019   Lab Results  Component Value Date   LDLDIRECT 80.0 05/08/2018   Takes atorvastatin 10 mg daily   Iron def anemia Lab Results  Component Value Date   WBC 6.9 09/30/2020   HGB 15.4 09/30/2020   HCT 45.2 09/30/2020   MCV 87.3 09/30/2020   PLT 136.0 (L) 09/30/2020   Lab Results  Component Value Date   IRON 108 06/06/2016   TIBC 304 06/06/2016   FERRITIN 53.8 09/30/2020   No oral iron currently   Prediabetes Lab Results  Component Value Date   HGBA1C 5.7 09/30/2020  Last time 5.6 Stable   Alk phos - always elevated  Improved at 121  Eating a lot of chicken and salads recently  Overall better    Patient Active Problem List   Diagnosis Date Noted  Thrombocytopenia ([place]) 10/07/2020   Pedal edema 03/28/2020   Gallstones 06/26/2019   Coronary artery disease 05/20/2019   Constipation 08/30/2018   Fatigue 08/30/2018   Hyponatremia 08/30/2018   Dizziness 08/26/2018   Peptic ulcer disease 06/14/2016   Iron deficiency anemia 06/14/2016   B12 deficiency 06/14/2016   Prediabetes 01/31/2016   Need for hepatitis C screening test 01/25/2016   Post-nasal drip 12/10/2015   GERD (gastroesophageal reflux disease) 07/20/2015   Routine general medical examination at a health care facility 10/28/2014   Elevated alkaline phosphatase level 10/28/2014   BPH (benign prostatic hyperplasia) 04/29/2014   Prostate cancer screening 08/11/2013   Encounter for Medicare annual wellness exam 08/11/2013   Dyslipidemia, goal LDL below 70 01/09/2007   ERECTILE DYSFUNCTION 01/09/2007   Essential hypertension 01/09/2007   Hx of  CABG 01/09/2007   ESOPHAGITIS 01/09/2007   Past Medical History:  Diagnosis Date   BPH (benign prostatic hyperplasia)    CAD (coronary artery disease)    cardiolite ok 3/05, stress test- low risk study 11/06   Chronic low back pain    with radiculopathy   ED (erectile dysfunction)    Gallstones    abd [place]- stable hamangioma, gallbladder sludge and tiny stones 09/2003   GERD (gastroesophageal reflux disease)    Hyperlipidemia    Hypertension    Transfusion history    Past Surgical History:  Procedure Laterality Date   CATARACT EXTRACTION W/PHACO  03/13/2012   Procedure: CATARACT EXTRACTION PHACO AND INTRAOCULAR LENS PLACEMENT ([facility]);  Surgeon: Adonis Brook, MD;  Location: [facility];  Service: Ophthalmology;  Laterality: Left;   CORONARY ARTERY BYPASS GRAFT  1998   x 1  done here [facility]   ESOPHAGOGASTRODUODENOSCOPY (EGD) WITH PROPOFOL N/A 06/07/2016   Procedure: ESOPHAGOGASTRODUODENOSCOPY (EGD) WITH PROPOFOL;  Surgeon: Mauri Pole, MD;  Location: [facility];  Service: Endoscopy;  Laterality: N/A;   GASTRECTOMY     partial   MEMBRANE PEEL  03/13/2012   Procedure: MEMBRANE PEEL;  Surgeon: Adonis Brook, MD;  Location: [facility];  Service: Ophthalmology;  Laterality: Left;   PARS PLANA VITRECTOMY  03/13/2012   Procedure: PARS PLANA VITRECTOMY WITH 23 GAUGE;  Surgeon: Adonis Brook, MD;  Location: [facility];  Service: Ophthalmology;  Laterality: Left;   Social History   Tobacco Use   Smoking status: Never   Smokeless tobacco: Never  Vaping Use   Vaping Use: Never used  Substance Use Topics   Alcohol use: No    Alcohol/week: 0.0 standard drinks   Drug use: No   Family History  Problem Relation Age of Onset   Cancer Sister        unknown type, pt feels alcohol related   Colon cancer Brother    Throat cancer Brother        pt feels alcohol related   Colon cancer Brother        1/2 brother   Hypertension Mother    Stomach cancer Neg Hx    Pancreatic cancer Neg Hx    No Known  Allergies Current Outpatient Medications on File Prior to Visit  Medication Sig Dispense Refill   amLODipine (NORVASC) 5 MG tablet Take 1 tablet by mouth daily.     aspirin EC 81 MG tablet Take 1 tablet (81 mg total) by mouth daily. 90 tablet 3   atorvastatin (LIPITOR) 10 MG tablet TAKE 1 TABLET BY MOUTH EVERY DAY 90 tablet 3   carvedilol (COREG) 3.125 MG tablet TAKE 1 TABLET BY MOUTH 2  TIMES DAILY WITH A MEAL. 180 tablet 3   dicyclomine (BENTYL) 10 MG capsule TAKE 1 CAPSULE (10 MG TOTAL) BY MOUTH 2 (TWO) TIMES DAILY AS NEEDED FOR SPASMS. 180 capsule 0   doxazosin (CARDURA) 4 MG tablet Take 1 tablet (4 mg total) by mouth daily. 90 tablet 3   pantoprazole (PROTONIX) 40 MG tablet TAKE 1 TABLET BY MOUTH EVERY DAY 90 tablet 1   tamsulosin (FLOMAX) 0.4 MG CAPS capsule TAKE 1 CAPSULES BY MOUTH ONCE DAILY. NEEDS OFFICE VISIT 90 capsule 0   triamcinolone (KENALOG) 0.1 % Apply 1 application topically 2 (two) times daily. To affected (rash) areas 30 g 1   valsartan (DIOVAN) 320 MG tablet TAKE 1 TABLET BY MOUTH EVERY DAY 90 tablet 3   hydrALAZINE (APRESOLINE) 25 MG tablet Take 1 tablet (25 mg total) by mouth 3 (three) times daily. 60 tablet 5   No current facility-administered medications on file prior to visit.    Review of Systems  Constitutional:  Negative for activity change, appetite change, fatigue, fever and unexpected weight change.  HENT:  Negative for congestion, rhinorrhea, sore throat and trouble swallowing.   Eyes:  Negative for pain, redness, itching and visual disturbance.  Respiratory:  Negative for cough, chest tightness, shortness of breath and wheezing.   Cardiovascular:  Negative for chest pain and palpitations.  Gastrointestinal:  Negative for abdominal pain, blood in stool, constipation, diarrhea and nausea.  Endocrine: Negative for cold intolerance, heat intolerance, polydipsia and polyuria.  Genitourinary:  Negative for difficulty urinating, dysuria, frequency and urgency.   Musculoskeletal:  Negative for arthralgias, joint swelling and myalgias.  Skin:  Negative for pallor and rash.  Neurological:  Negative for dizziness, tremors, weakness, numbness and headaches.       Light headed when he stands up quickly  Hematological:  Negative for adenopathy. Does not bruise/bleed easily.  Psychiatric/Behavioral:  Negative for decreased concentration and dysphoric mood. The patient is not nervous/anxious.       Objective:   Physical Exam Constitutional:      General: He is not in acute distress.    Appearance: Normal appearance. He is well-developed and normal weight. He is not ill-appearing or diaphoretic.  HENT:     Head: Normocephalic and atraumatic.     Right Ear: Tympanic membrane, ear canal and external ear normal.     Left Ear: Tympanic membrane, ear canal and external ear normal.     Nose: Nose normal. No congestion.     Mouth/Throat:     Mouth: Mucous membranes are moist.     Pharynx: Oropharynx is clear. No posterior oropharyngeal erythema.  Eyes:     General: No scleral icterus.       Right eye: No discharge.        Left eye: No discharge.     Conjunctiva/sclera: Conjunctivae normal.     Pupils: Pupils are equal, round, and reactive to light.  Neck:     Thyroid: No thyromegaly.     Vascular: No carotid bruit or JVD.  Cardiovascular:     Rate and Rhythm: Normal rate and regular rhythm.     Pulses: Normal pulses.     Heart sounds: Normal heart sounds.    No gallop.  Pulmonary:     Effort: Pulmonary effort is normal. No respiratory distress.     Breath sounds: Normal breath sounds. No wheezing or rales.     Comments: Good air exch Chest:     Chest wall: No tenderness.  Abdominal:  General: Bowel sounds are normal. There is no distension or abdominal bruit.     Palpations: Abdomen is soft. There is no mass.     Tenderness: There is no abdominal tenderness.     Hernia: No hernia is present.  Musculoskeletal:        General: No tenderness.      Cervical back: Normal range of motion and neck supple. No rigidity. No muscular tenderness.     Right lower leg: No edema.     Left lower leg: No edema.     Comments: No kyphoisis  Lymphadenopathy:     Cervical: No cervical adenopathy.  Skin:    General: Skin is warm and dry.     Coloration: Skin is not pale.     Findings: No erythema or rash.  Neurological:     Mental Status: He is alert.     Cranial Nerves: No cranial nerve deficit.     Motor: No abnormal muscle tone.     Coordination: Coordination normal.     Gait: Gait normal.     Deep Tendon Reflexes: Reflexes are normal and symmetric. Reflexes normal.  Psychiatric:        Mood and Affect: Mood normal.        Cognition and Memory: Cognition and memory normal.     Comments: Pleasant           Assessment & Plan:   Problem List Items Addressed This Visit       Cardiovascular and Mediastinum   Essential hypertension    bp in fair control at this time  BP Readings from Last 1 Encounters:  10/07/20 136/72  No changes needed Most recent labs reviewed  Disc lifstyle change with low sodium diet and exercise  Under care of cardiology Plan to continue  Coreg 3.125 mg Cardura 4 mg daily  Hydralazine 25 mg tid Valsartan 320 mg daily  Amlodipine 5 mg daily   Adv to use caution/not stand up quickly       Relevant Medications   amLODipine (NORVASC) 5 MG tablet     Digestive   GERD (gastroesophageal reflux disease)    Continues protonix 40 mg daily  Past hx of GI bleed and gastrectomy as well         Genitourinary   BPH (benign prostatic hyperplasia)    Sees urology and no clinical changes Continues flomax           Other   Dyslipidemia, goal LDL below 70    Disc goals for lipids and reasons to control them Rev last labs with pt Rev low sat fat diet in detail Well controlled with atorvastatin 10 mg daily and diet         Relevant Medications   amLODipine (NORVASC) 5 MG tablet   Routine  general medical examination at a health care facility - Primary    Reviewed health habits including diet and exercise and skin cancer prevention Reviewed appropriate screening tests for age  Also reviewed health mt list, fam hx and immunization status , as well as social and family history   See HPI Labs reviewed  Enc more exercise Discussed shingrix vaccine and will check on coverage  Colonoscopy is due for 5 y recall in October (fam hx of colon cancer) Continues urology f/u for prostate health  Rev amw from march       Prediabetes    Lab Results  Component Value Date   HGBA1C 5.7 09/30/2020  Stable disc  imp of low glycemic diet and wt loss to prevent DM2        Iron deficiency anemia    Cbc is nl but platelet ct down Ferritin 53 Will watch this  Not currently taking iron        Relevant Orders   CBC with Differential/Platelet   Pathologist smear review   Iron   Ferritin   B12 deficiency    Lab Results  Component Value Date   VITAMINB12 349 09/30/2020  Doing ok w/o supplementation right now       Thrombocytopenia ([facility])    Platelet ct 136 No bleeding or bruising  H/o iron def so we will watch this  Re check cbc in 1 mo with iron and path ref       Relevant Orders   CBC with Differential/Platelet   Pathologist smear review   Iron   Ferritin

## 2020-10-07 NOTE — Assessment & Plan Note (Signed)
Lab Results  Component Value Date   HGBA1C 5.7 09/30/2020   Stable disc imp of low glycemic diet and wt loss to prevent DM2

## 2020-10-07 NOTE — Assessment & Plan Note (Signed)
Cbc is nl but platelet ct down Ferritin 53 Will watch this  Not currently taking iron

## 2020-10-07 NOTE — Assessment & Plan Note (Signed)
Platelet ct 136 No bleeding or bruising  H/o iron def so we will watch this  Re check cbc in 1 mo with iron and path ref

## 2020-10-07 NOTE — Assessment & Plan Note (Signed)
Sees urology and no clinical changes Continues flomax

## 2020-10-07 NOTE — Assessment & Plan Note (Signed)
Disc goals for lipids and reasons to control them Rev last labs with pt Rev low sat fat diet in detail Well controlled with atorvastatin 10 mg daily and diet

## 2020-11-04 ENCOUNTER — Other Ambulatory Visit: Payer: Self-pay | Admitting: Family Medicine

## 2020-11-08 ENCOUNTER — Other Ambulatory Visit (INDEPENDENT_AMBULATORY_CARE_PROVIDER_SITE_OTHER): Payer: Medicare Other

## 2020-11-08 ENCOUNTER — Other Ambulatory Visit: Payer: Self-pay

## 2020-11-08 DIAGNOSIS — D696 Thrombocytopenia, unspecified: Secondary | ICD-10-CM | POA: Diagnosis not present

## 2020-11-08 DIAGNOSIS — D509 Iron deficiency anemia, unspecified: Secondary | ICD-10-CM

## 2020-11-08 LAB — IRON: Iron: 122 ug/dL (ref 42–165)

## 2020-11-08 LAB — FERRITIN: Ferritin: 62.7 ng/mL (ref 22.0–322.0)

## 2020-11-08 NOTE — Addendum Note (Signed)
Addended by: Ellamae Sia on: 11/08/2020 07:43 AM   Modules accepted: Orders

## 2020-11-09 LAB — CBC WITH DIFFERENTIAL/PLATELET
Absolute Monocytes: 525 cells/uL (ref 200–950)
Basophils Absolute: 38 cells/uL (ref 0–200)
Basophils Relative: 0.5 %
Eosinophils Absolute: 83 cells/uL (ref 15–500)
Eosinophils Relative: 1.1 %
HCT: 46.5 % (ref 38.5–50.0)
Hemoglobin: 15.6 g/dL (ref 13.2–17.1)
Lymphs Abs: 1778 cells/uL (ref 850–3900)
MCH: 29.6 pg (ref 27.0–33.0)
MCHC: 33.5 g/dL (ref 32.0–36.0)
MCV: 88.2 fL (ref 80.0–100.0)
MPV: 12.5 fL (ref 7.5–12.5)
Monocytes Relative: 7 %
Neutro Abs: 5078 cells/uL (ref 1500–7800)
Neutrophils Relative %: 67.7 %
Platelets: 156 10*3/uL (ref 140–400)
RBC: 5.27 10*6/uL (ref 4.20–5.80)
RDW: 13.3 % (ref 11.0–15.0)
Total Lymphocyte: 23.7 %
WBC: 7.5 10*3/uL (ref 3.8–10.8)

## 2020-11-09 LAB — PATHOLOGIST SMEAR REVIEW

## 2020-11-16 ENCOUNTER — Other Ambulatory Visit: Payer: Self-pay | Admitting: Cardiovascular Disease

## 2020-11-30 ENCOUNTER — Telehealth: Payer: Self-pay | Admitting: Cardiovascular Disease

## 2020-11-30 NOTE — Telephone Encounter (Signed)
Feels like gas right on top of chest reported several times it is not chest pain. States HR speeds up when having gas when he bends over.    Pt c/o Syncope: STAT if syncope occurred within 30 minutes and pt complains of lightheadedness High Priority if episode of passing out, completely, today or in last 24 hours   Did you pass out today? No   When is the last time you passed out? Has not just felt like is going to   Has this occurred multiple times? Yes  Did you have any symptoms prior to passing out? Gas, rapid HR off and on, dizziness  STAT if patient feels like he/she is going to faint   Are you dizzy now? States "kinda"  Do you feel faint or have you passed out? No   Do you have any other symptoms? Gas, elevated HR off and on   Have you checked your HR and BP (record if available)? States it was around 124-128/60's today last week was really low   Pt is scheduled to come in tomorrow morning in regards to this. Please advise. ?

## 2020-11-30 NOTE — Telephone Encounter (Signed)
Returned the call to the patient. He stated that for the past 3-4 days he has been having intermittent chest pain. He also stated that he has been having shortness of breath on exertion. He used to be able to walk to the mailbox and now cannot without getting out of breath.  He stated that the chest pain feels like gas. When he is able to burp, the pain is relieved.   He also stated that his blood pressure has been low but did not have any readings. He has been advised to keep a log of his blood pressure readings.  He has a follow up with Dr. Gwenlyn Found tomorrow, 8/10.  Patient made aware of ED precautions should new or worsening symptoms occur. Patient verbalized understanding.

## 2020-12-01 ENCOUNTER — Ambulatory Visit: Payer: Medicare Other | Admitting: Cardiovascular Disease

## 2020-12-01 ENCOUNTER — Encounter: Payer: Self-pay | Admitting: Cardiovascular Disease

## 2020-12-01 ENCOUNTER — Other Ambulatory Visit: Payer: Self-pay

## 2020-12-01 DIAGNOSIS — I10 Essential (primary) hypertension: Secondary | ICD-10-CM | POA: Diagnosis not present

## 2020-12-01 DIAGNOSIS — Z951 Presence of aortocoronary bypass graft: Secondary | ICD-10-CM | POA: Diagnosis not present

## 2020-12-01 DIAGNOSIS — E785 Hyperlipidemia, unspecified: Secondary | ICD-10-CM | POA: Diagnosis not present

## 2020-12-01 LAB — BASIC METABOLIC PANEL
BUN/Creatinine Ratio: 9 — ABNORMAL LOW (ref 10–24)
BUN: 10 mg/dL (ref 8–27)
CO2: 22 mmol/L (ref 20–29)
Calcium: 9 mg/dL (ref 8.6–10.2)
Chloride: 105 mmol/L (ref 96–106)
Creatinine, Ser: 1.06 mg/dL (ref 0.76–1.27)
Glucose: 105 mg/dL — ABNORMAL HIGH (ref 65–99)
Potassium: 4.3 mmol/L (ref 3.5–5.2)
Sodium: 142 mmol/L (ref 134–144)
eGFR: 72 mL/min/{1.73_m2} (ref >59)

## 2020-12-01 LAB — CBC
Hematocrit: 47.6 % (ref 37.5–51.0)
Hemoglobin: 16 g/dL (ref 13.0–17.7)
MCH: 28.8 pg (ref 26.6–33.0)
MCHC: 33.6 g/dL (ref 31.5–35.7)
MCV: 86 fL (ref 79–97)
Platelets: 147 10*3/uL — ABNORMAL LOW (ref 150–450)
RBC: 5.55 x10E6/uL (ref 4.14–5.80)
RDW: 13.1 % (ref 11.6–15.4)
WBC: 8.3 10*3/uL (ref 3.4–10.8)

## 2020-12-01 NOTE — H&P (View-Only) (Signed)
12/01/2020 Jerry Farrell   1945-06-10  [id number]  Primary Physician Tower, Wynelle Fanny, MD Primary Cardiologist: Lorretta Harp MD [name], [place]  HPI:  Jerry Farrell is a 75 y.o.   thin-appearing married African-American male father of 38, grandfather and 5 grandchildren who is referred back to me by Dr. Glori Bickers, his PCP, to be reestablished in my practice. I last saw him in the office 06/18/2020.Marland Kitchen His risk factors include treated hypertension and hyperlipidemia. There is is a family history of a brother who recently had bypass surgery. He has never smoked. Retired from [employer]. He had coronary artery bypass grafting X 1  in 1994 for what sounds like left main disease. He denies chest pain or shortness of breath. He did see how main in the office 06/02/16 after having had a Myoview stress test 04/11/16 that was essentially normal with no evidence of ischemia.    Since I saw him in the office 6 months ago he has developed exertional chest tightness and dyspnea as well as fatigue.  He did not have the symptoms when I saw him back in February.  Current Meds  Medication Sig   amLODipine (NORVASC) 5 MG tablet Take 1 tablet by mouth daily.   aspirin EC 81 MG tablet Take 1 tablet (81 mg total) by mouth daily.   atorvastatin (LIPITOR) 10 MG tablet TAKE 1 TABLET BY MOUTH EVERY DAY   carvedilol (COREG) 3.125 MG tablet TAKE 1 TABLET BY MOUTH 2 TIMES DAILY WITH A MEAL.   dicyclomine (BENTYL) 10 MG capsule TAKE 1 CAPSULE (10 MG TOTAL) BY MOUTH 2 (TWO) TIMES DAILY AS NEEDED FOR SPASMS.   doxazosin (CARDURA) 4 MG tablet Take 1 tablet (4 mg total) by mouth daily.   hydrALAZINE (APRESOLINE) 25 MG tablet Take 1 tablet (25 mg total) by mouth 3 (three) times daily.   pantoprazole (PROTONIX) 40 MG tablet TAKE 1 TABLET BY MOUTH EVERY DAY   tamsulosin (FLOMAX) 0.4 MG CAPS capsule Take 1 capsules by mouth once daily.   triamcinolone (KENALOG) 0.1 % Apply 1 application topically 2 (two) times daily. To  affected (rash) areas   valsartan (DIOVAN) 320 MG tablet TAKE 1 TABLET BY MOUTH EVERY DAY     No Known Allergies  Social History   Socioeconomic History   Marital status: Married    Spouse name: Not on file   Number of children: 3   Years of education: Not on file   Highest education level: Not on file  Occupational History   Occupation: [employer]    Employer: RETIRED  Tobacco Use   Smoking status: Never   Smokeless tobacco: Never  Vaping Use   Vaping Use: Never used  Substance and Sexual Activity   Alcohol use: No    Alcohol/week: 0.0 standard drinks   Drug use: No   Sexual activity: Yes  Other Topics Concern   Not on file  Social History Narrative   Not on file   Social Determinants of Health   Financial Resource Strain: Low Risk    Difficulty of Paying Living Expenses: Not hard at all  Food Insecurity: No Food Insecurity   Worried About Charity fundraiser in the Last Year: Never true   [place] in the Last Year: Never true  Transportation Needs: No Transportation Needs   Lack of Transportation (Medical): No   Lack of Transportation (Non-Medical): No  Physical Activity: Sufficiently Active   Days of Exercise per  Week: 3 days   Minutes of Exercise per Session: 60 min  Stress: No Stress Concern Present   Feeling of Stress : Not at all  Social Connections: Not on file  Intimate Partner Violence: Not At Risk   Fear of Current or Ex-Partner: No   Emotionally Abused: No   Physically Abused: No   Sexually Abused: No     Review of Systems: General: negative for chills, fever, night sweats or weight changes.  Cardiovascular: negative for chest pain, dyspnea on exertion, edema, orthopnea, palpitations, paroxysmal nocturnal dyspnea or shortness of breath Dermatological: negative for rash Respiratory: negative for cough or wheezing Urologic: negative for hematuria Abdominal: negative for nausea, vomiting, diarrhea, bright red blood per rectum, melena, or  hematemesis Neurologic: negative for visual changes, syncope, or dizziness All other systems reviewed and are otherwise negative except as noted above.    Blood pressure (!) 138/54, pulse 71, height '5\' 8"'$  (1.727 m), weight 184 lb 6.4 oz (83.6 kg), SpO2 98 %.  General appearance: alert and no distress Neck: no adenopathy, no JVD, supple, symmetrical, trachea midline, thyroid not enlarged, symmetric, no tenderness/mass/nodules, and right carotid bruit Lungs: clear to auscultation bilaterally Heart: regular rate and rhythm, S1, S2 normal, no murmur, click, rub or gallop Extremities: extremities normal, atraumatic, no cyanosis or edema Pulses: 2+ and symmetric Skin: Skin color, texture, turgor normal. No rashes or lesions Neurologic: Grossly normal  EKG sinus rhythm at 71 with LVH voltage and repolarization changes unchanged from prior EKGs.  I personally reviewed this EKG.  ASSESSMENT AND PLAN:   Dyslipidemia, goal LDL below 70 History of dyslipidemia on statin therapy with lipid profile performed 09/30/2020 revealing total cholesterol 138, LDL 65 and HDL 46.  Essential hypertension History of essential hypertension a blood pressure measured today at 138/54.  He is on amlodipine, hydralazine, and carvedilol.  Hx of CABG History of CAD status post CABG x1 in 1994 with a negative Myoview in 2017.  Over the last 6 months he has developed fatigue, dyspnea on exertion and exertional chest tightness.  I favor proceeding with outpatient diagnostic coronary angiography.     Lorretta Harp MD FACP,FACC,FAHA, [facility] 12/01/2020 9:21 AM

## 2020-12-01 NOTE — Progress Notes (Signed)
12/01/2020 Jerry Farrell   10-10-1945  [id number]  Primary Physician Tower, Wynelle Fanny, MD Primary Cardiologist: Lorretta Harp MD [name], [place]  HPI:  Jerry Farrell is a 75 y.o.   thin-appearing married African-American male father of 35, grandfather and 5 grandchildren who is referred back to me by Dr. Glori Bickers, his PCP, to be reestablished in my practice. I last saw him in the office 06/18/2020.Marland Kitchen His risk factors include treated hypertension and hyperlipidemia. There is is a family history of a brother who recently had bypass surgery. He has never smoked. Retired from [employer]. He had coronary artery bypass grafting X 1  in 1994 for what sounds like left main disease. He denies chest pain or shortness of breath. He did see how main in the office 06/02/16 after having had a Myoview stress test 04/11/16 that was essentially normal with no evidence of ischemia.    Since I saw him in the office 6 months ago he has developed exertional chest tightness and dyspnea as well as fatigue.  He did not have the symptoms when I saw him back in February.  Current Meds  Medication Sig   amLODipine (NORVASC) 5 MG tablet Take 1 tablet by mouth daily.   aspirin EC 81 MG tablet Take 1 tablet (81 mg total) by mouth daily.   atorvastatin (LIPITOR) 10 MG tablet TAKE 1 TABLET BY MOUTH EVERY DAY   carvedilol (COREG) 3.125 MG tablet TAKE 1 TABLET BY MOUTH 2 TIMES DAILY WITH A MEAL.   dicyclomine (BENTYL) 10 MG capsule TAKE 1 CAPSULE (10 MG TOTAL) BY MOUTH 2 (TWO) TIMES DAILY AS NEEDED FOR SPASMS.   doxazosin (CARDURA) 4 MG tablet Take 1 tablet (4 mg total) by mouth daily.   hydrALAZINE (APRESOLINE) 25 MG tablet Take 1 tablet (25 mg total) by mouth 3 (three) times daily.   pantoprazole (PROTONIX) 40 MG tablet TAKE 1 TABLET BY MOUTH EVERY DAY   tamsulosin (FLOMAX) 0.4 MG CAPS capsule Take 1 capsules by mouth once daily.   triamcinolone (KENALOG) 0.1 % Apply 1 application topically 2 (two) times daily. To  affected (rash) areas   valsartan (DIOVAN) 320 MG tablet TAKE 1 TABLET BY MOUTH EVERY DAY     No Known Allergies  Social History   Socioeconomic History   Marital status: Married    Spouse name: Not on file   Number of children: 3   Years of education: Not on file   Highest education level: Not on file  Occupational History   Occupation: [employer]    Employer: RETIRED  Tobacco Use   Smoking status: Never   Smokeless tobacco: Never  Vaping Use   Vaping Use: Never used  Substance and Sexual Activity   Alcohol use: No    Alcohol/week: 0.0 standard drinks   Drug use: No   Sexual activity: Yes  Other Topics Concern   Not on file  Social History Narrative   Not on file   Social Determinants of Health   Financial Resource Strain: Low Risk    Difficulty of Paying Living Expenses: Not hard at all  Food Insecurity: No Food Insecurity   Worried About Charity fundraiser in the Last Year: Never true   [place] in the Last Year: Never true  Transportation Needs: No Transportation Needs   Lack of Transportation (Medical): No   Lack of Transportation (Non-Medical): No  Physical Activity: Sufficiently Active   Days of Exercise per  Week: 3 days   Minutes of Exercise per Session: 60 min  Stress: No Stress Concern Present   Feeling of Stress : Not at all  Social Connections: Not on file  Intimate Partner Violence: Not At Risk   Fear of Current or Ex-Partner: No   Emotionally Abused: No   Physically Abused: No   Sexually Abused: No     Review of Systems: General: negative for chills, fever, night sweats or weight changes.  Cardiovascular: negative for chest pain, dyspnea on exertion, edema, orthopnea, palpitations, paroxysmal nocturnal dyspnea or shortness of breath Dermatological: negative for rash Respiratory: negative for cough or wheezing Urologic: negative for hematuria Abdominal: negative for nausea, vomiting, diarrhea, bright red blood per rectum, melena, or  hematemesis Neurologic: negative for visual changes, syncope, or dizziness All other systems reviewed and are otherwise negative except as noted above.    Blood pressure (!) 138/54, pulse 71, height '5\' 8"'$  (1.727 m), weight 184 lb 6.4 oz (83.6 kg), SpO2 98 %.  General appearance: alert and no distress Neck: no adenopathy, no JVD, supple, symmetrical, trachea midline, thyroid not enlarged, symmetric, no tenderness/mass/nodules, and right carotid bruit Lungs: clear to auscultation bilaterally Heart: regular rate and rhythm, S1, S2 normal, no murmur, click, rub or gallop Extremities: extremities normal, atraumatic, no cyanosis or edema Pulses: 2+ and symmetric Skin: Skin color, texture, turgor normal. No rashes or lesions Neurologic: Grossly normal  EKG sinus rhythm at 71 with LVH voltage and repolarization changes unchanged from prior EKGs.  I personally reviewed this EKG.  ASSESSMENT AND PLAN:   Dyslipidemia, goal LDL below 70 History of dyslipidemia on statin therapy with lipid profile performed 09/30/2020 revealing total cholesterol 138, LDL 65 and HDL 46.  Essential hypertension History of essential hypertension a blood pressure measured today at 138/54.  He is on amlodipine, hydralazine, and carvedilol.  Hx of CABG History of CAD status post CABG x1 in 1994 with a negative Myoview in 2017.  Over the last 6 months he has developed fatigue, dyspnea on exertion and exertional chest tightness.  I favor proceeding with outpatient diagnostic coronary angiography.     Lorretta Harp MD FACP,FACC,FAHA, [facility] 12/01/2020 9:21 AM

## 2020-12-01 NOTE — Assessment & Plan Note (Signed)
History of dyslipidemia on statin therapy with lipid profile performed 09/30/2020 revealing total cholesterol 138, LDL 65 and HDL 46.

## 2020-12-01 NOTE — Patient Instructions (Signed)
Medication Instructions:  The current medical regimen is effective;  continue present plan and medications.  *If you need a refill on your cardiac medications before your next appointment, please call your pharmacy*   Lab Work: CBC, BMET today   If you have labs (blood work) drawn today and your tests are completely normal, you will receive your results only by: [place] (if you have MyChart) OR A paper copy in the mail If you have any lab test that is abnormal or we need to change your treatment, we will call you to review the results.   Testing/Procedures: Your physician has requested that you have a cardiac catheterization. Cardiac catheterization is used to diagnose and/or treat various heart conditions. Doctors may recommend this procedure for a number of different reasons. The most common reason is to evaluate chest pain. Chest pain can be a symptom of coronary artery disease (CAD), and cardiac catheterization can show whether plaque is narrowing or blocking your heart's arteries. This procedure is also used to evaluate the valves, as well as measure the blood flow and oxygen levels in different parts of your heart. For further information please visit [email or web address]. Please follow instruction sheet, as given.    Follow-Up: At [facility], you and your health needs are our priority.  As part of our continuing mission to provide you with exceptional heart care, we have created designated Provider Care Teams.  These Care Teams include your primary Cardiologist (physician) and Advanced Practice Providers (APPs -  Physician Assistants and Nurse Practitioners) who all work together to provide you with the care you need, when you need it.  We recommend signing up for the patient portal called "MyChart".  Sign up information is provided on this After Visit Summary.  MyChart is used to connect with patients for Virtual Visits (Telemedicine).  Patients are able to view lab/test  results, encounter notes, upcoming appointments, etc.  Non-urgent messages can be sent to your provider as well.   To learn more about what you can do with MyChart, go to [email or web address].    Your next appointment:   August 30th at 9:00 AM (2 weeks post)   The format for your next appointment:   In Person  Provider:   Quay Burow, MD   Other Instructions  [place] [address] Dept: [phone] Loc: [place]  12/01/2020  You are scheduled for a Cardiac Catheterization on Thursday, August 11 with Dr. Quay Burow.  1. Please arrive at the [facility] (Main Entrance A) at [facility]: [address] [place], [place] [address] at 11:00 AM (This time is two hours before your procedure to ensure your preparation). Free valet parking service is available.   Special note: Every effort is made to have your procedure done on time. Please understand that emergencies sometimes delay scheduled procedures.  2. Diet: Do not eat solid foods after midnight.  The patient may have clear liquids until 5am upon the day of the procedure.  3. Labs: You will need to have blood drawn today. CBC, BMET. You do not need to be fasting.  4. Medication instructions in preparation for your procedure:   Contrast Allergy: No   On the morning of your procedure, take your Aspirin and any morning medicines NOT listed above.  You may use sips of water.  5. Plan for one night stay--bring personal belongings. 6. Bring a current  list of your medications and current insurance cards. 7. You MUST have a responsible person to drive you home. 8. Someone MUST be with you the first 24 hours after you arrive home or your discharge will be delayed. 9. Please wear clothes that are easy to get on and off and wear slip-on shoes.  Thank you for allowing [place] to care for  you!   -- [facility]

## 2020-12-01 NOTE — Assessment & Plan Note (Signed)
History of essential hypertension a blood pressure measured today at 138/54.  He is on amlodipine, hydralazine, and carvedilol.

## 2020-12-01 NOTE — Assessment & Plan Note (Signed)
History of CAD status post CABG x1 in 1994 with a negative Myoview in 2017.  Over the last 6 months he has developed fatigue, dyspnea on exertion and exertional chest tightness.  I favor proceeding with outpatient diagnostic coronary angiography.

## 2020-12-02 ENCOUNTER — Ambulatory Visit (HOSPITAL_COMMUNITY)
Admission: RE | Admit: 2020-12-02 | Discharge: 2020-12-03 | Disposition: A | Discharge status: Home / Self Care | Payer: Medicare Other | Attending: Cardiovascular Disease | Admitting: Cardiovascular Disease

## 2020-12-02 ENCOUNTER — Other Ambulatory Visit: Payer: Self-pay

## 2020-12-02 ENCOUNTER — Encounter (HOSPITAL_COMMUNITY)
Admission: RE | Disposition: A | Discharge status: Home / Self Care | Payer: Self-pay | Source: Home / Self Care | Attending: Cardiovascular Disease

## 2020-12-02 DIAGNOSIS — Z79899 Other long term (current) drug therapy: Secondary | ICD-10-CM | POA: Insufficient documentation

## 2020-12-02 DIAGNOSIS — Z7982 Long term (current) use of aspirin: Secondary | ICD-10-CM | POA: Diagnosis not present

## 2020-12-02 DIAGNOSIS — R079 Chest pain, unspecified: Secondary | ICD-10-CM

## 2020-12-02 DIAGNOSIS — I25110 Atherosclerotic heart disease of native coronary artery with unstable angina pectoris: Secondary | ICD-10-CM | POA: Diagnosis not present

## 2020-12-02 DIAGNOSIS — E785 Hyperlipidemia, unspecified: Secondary | ICD-10-CM | POA: Insufficient documentation

## 2020-12-02 DIAGNOSIS — Z951 Presence of aortocoronary bypass graft: Secondary | ICD-10-CM | POA: Diagnosis not present

## 2020-12-02 DIAGNOSIS — I2510 Atherosclerotic heart disease of native coronary artery without angina pectoris: Secondary | ICD-10-CM | POA: Diagnosis present

## 2020-12-02 DIAGNOSIS — I25708 Atherosclerosis of coronary artery bypass graft(s), unspecified, with other forms of angina pectoris: Secondary | ICD-10-CM | POA: Diagnosis not present

## 2020-12-02 DIAGNOSIS — I2582 Chronic total occlusion of coronary artery: Secondary | ICD-10-CM | POA: Insufficient documentation

## 2020-12-02 DIAGNOSIS — I10 Essential (primary) hypertension: Secondary | ICD-10-CM | POA: Insufficient documentation

## 2020-12-02 HISTORY — PX: LEFT HEART CATH AND CORS/GRAFTS ANGIOGRAPHY: CATH118250

## 2020-12-02 LAB — POCT ACTIVATED CLOTTING TIME: Activated Clotting Time: 312 seconds

## 2020-12-02 SURGERY — LEFT HEART CATH AND CORS/GRAFTS ANGIOGRAPHY
Anesthesia: LOCAL

## 2020-12-02 MED ORDER — HEPARIN SODIUM (PORCINE) 1000 UNIT/ML IJ SOLN
INTRAMUSCULAR | Status: AC
  Filled 2020-12-02: qty 1

## 2020-12-02 MED ORDER — LIDOCAINE HCL (PF) 1 % IJ SOLN
INTRAMUSCULAR | Status: AC
  Filled 2020-12-02: qty 30

## 2020-12-02 MED ORDER — SODIUM CHLORIDE 0.9% FLUSH: 3.0000 mL | INTRAVENOUS | Status: DC | PRN

## 2020-12-02 MED ORDER — ONDANSETRON HCL 4 MG/2ML IJ SOLN: 4.0000 mg | Freq: Four times a day (QID) | INTRAMUSCULAR | Status: DC | PRN

## 2020-12-02 MED ORDER — SODIUM CHLORIDE 0.9 % IV SOLN: 250.0000 mL | INTRAVENOUS | Status: DC | PRN

## 2020-12-02 MED ORDER — PANTOPRAZOLE SODIUM 40 MG PO TBEC
40.0000 mg | DELAYED_RELEASE_TABLET | Freq: Every day | ORAL | Status: DC
  Administered 2020-12-03: 40 mg via ORAL
  Filled 2020-12-02 (×2): qty 1

## 2020-12-02 MED ORDER — HEPARIN (PORCINE) IN NACL 1000-0.9 UT/500ML-% IV SOLN
INTRAVENOUS | Status: AC
  Filled 2020-12-02: qty 1000

## 2020-12-02 MED ORDER — LIDOCAINE HCL (PF) 1 % IJ SOLN
INTRAMUSCULAR | Status: DC | PRN
  Administered 2020-12-02: 15 mL via INTRADERMAL

## 2020-12-02 MED ORDER — MORPHINE SULFATE (PF) 2 MG/ML IV SOLN: 2.0000 mg | INTRAVENOUS | Status: DC | PRN

## 2020-12-02 MED ORDER — CARVEDILOL 3.125 MG PO TABS
3.1250 mg | ORAL_TABLET | Freq: Two times a day (BID) | ORAL | Status: DC
  Administered 2020-12-02 – 2020-12-03 (×2): 3.125 mg via ORAL
  Filled 2020-12-02 (×2): qty 1

## 2020-12-02 MED ORDER — ACETAMINOPHEN 325 MG PO TABS: 650.0000 mg | ORAL_TABLET | ORAL | Status: DC | PRN

## 2020-12-02 MED ORDER — TAMSULOSIN HCL 0.4 MG PO CAPS
0.4000 mg | ORAL_CAPSULE | Freq: Every day | ORAL | Status: DC
  Administered 2020-12-02: 0.4 mg via ORAL
  Filled 2020-12-02: qty 1

## 2020-12-02 MED ORDER — IRBESARTAN 150 MG PO TABS
300.0000 mg | ORAL_TABLET | Freq: Every day | ORAL | Status: DC
  Administered 2020-12-03: 300 mg via ORAL
  Filled 2020-12-02 (×2): qty 2

## 2020-12-02 MED ORDER — HYDRALAZINE HCL 20 MG/ML IJ SOLN: 10.0000 mg | INTRAMUSCULAR | Status: AC | PRN

## 2020-12-02 MED ORDER — SODIUM CHLORIDE 0.9% FLUSH
3.0000 mL | Freq: Two times a day (BID) | INTRAVENOUS | Status: DC
  Administered 2020-12-03: 3 mL via INTRAVENOUS

## 2020-12-02 MED ORDER — SODIUM CHLORIDE 0.9 % IV SOLN: INTRAVENOUS | Status: AC

## 2020-12-02 MED ORDER — SODIUM CHLORIDE 0.9 % WEIGHT BASED INFUSION: 1.0000 mL/kg/h | INTRAVENOUS | Status: DC

## 2020-12-02 MED ORDER — IOHEXOL 350 MG/ML SOLN
INTRAVENOUS | Status: DC | PRN
  Administered 2020-12-02: 130 mL

## 2020-12-02 MED ORDER — ASPIRIN 81 MG PO CHEW
81.0000 mg | CHEWABLE_TABLET | Freq: Every day | ORAL | Status: DC
  Administered 2020-12-03: 81 mg via ORAL
  Filled 2020-12-02 (×2): qty 1

## 2020-12-02 MED ORDER — ASPIRIN EC 81 MG PO TBEC: 81.0000 mg | DELAYED_RELEASE_TABLET | Freq: Every day | ORAL | Status: DC

## 2020-12-02 MED ORDER — ATORVASTATIN CALCIUM 10 MG PO TABS
10.0000 mg | ORAL_TABLET | Freq: Every day | ORAL | Status: DC
  Administered 2020-12-03: 10 mg via ORAL
  Filled 2020-12-02 (×2): qty 1

## 2020-12-02 MED ORDER — NITROGLYCERIN 1 MG/10 ML FOR IR/CATH LAB
INTRA_ARTERIAL | Status: AC
  Filled 2020-12-02: qty 10

## 2020-12-02 MED ORDER — HEPARIN SODIUM (PORCINE) 1000 UNIT/ML IJ SOLN
INTRAMUSCULAR | Status: DC | PRN
  Administered 2020-12-02: 9000 [IU] via INTRAVENOUS

## 2020-12-02 MED ORDER — PHENYLEPHRINE-DM-GG 5-10-100 MG/5ML PO LIQD: 5.0000 mL | Freq: Every day | ORAL | Status: DC | PRN

## 2020-12-02 MED ORDER — AMLODIPINE BESYLATE 2.5 MG PO TABS
2.5000 mg | ORAL_TABLET | Freq: Two times a day (BID) | ORAL | Status: DC
  Administered 2020-12-02 – 2020-12-03 (×2): 2.5 mg via ORAL
  Filled 2020-12-02 (×2): qty 1

## 2020-12-02 MED ORDER — GUAIFENESIN-DM 100-10 MG/5ML PO SYRP: 5.0000 mL | ORAL_SOLUTION | Freq: Every day | ORAL | Status: DC | PRN

## 2020-12-02 MED ORDER — ASPIRIN 81 MG PO CHEW: 81.0000 mg | CHEWABLE_TABLET | ORAL | Status: DC

## 2020-12-02 MED ORDER — VERAPAMIL HCL 2.5 MG/ML IV SOLN
INTRAVENOUS | Status: AC
  Filled 2020-12-02: qty 2

## 2020-12-02 MED ORDER — SODIUM CHLORIDE 0.9 % WEIGHT BASED INFUSION
3.0000 mL/kg/h | INTRAVENOUS | Status: DC
  Administered 2020-12-02: 3 mL/kg/h via INTRAVENOUS

## 2020-12-02 MED ORDER — SODIUM CHLORIDE 0.9% FLUSH
3.0000 mL | Freq: Two times a day (BID) | INTRAVENOUS | Status: DC
  Administered 2020-12-03 (×2): 3 mL via INTRAVENOUS

## 2020-12-02 MED ORDER — CARVEDILOL 3.125 MG PO TABS: 3.1250 mg | ORAL_TABLET | Freq: Two times a day (BID) | ORAL | Status: DC

## 2020-12-02 MED ORDER — ALUM HYDROXIDE-MAG CARBONATE 95-358 MG/15ML PO SUSP
15.0000 mL | ORAL | Status: DC | PRN
  Filled 2020-12-02: qty 15

## 2020-12-02 MED ORDER — LABETALOL HCL 5 MG/ML IV SOLN: 10.0000 mg | INTRAVENOUS | Status: AC | PRN

## 2020-12-02 MED ORDER — HEPARIN (PORCINE) IN NACL 1000-0.9 UT/500ML-% IV SOLN
INTRAVENOUS | Status: DC | PRN
  Administered 2020-12-02 (×2): 500 mL

## 2020-12-02 MED ORDER — ALUM HYDROXIDE-MAG CARBONATE 95-358 MG/15ML PO SUSP
15.0000 mL | ORAL | Status: DC | PRN
  Administered 2020-12-02: 15 mL via ORAL
  Filled 2020-12-02 (×4): qty 15

## 2020-12-02 MED ORDER — DOXAZOSIN MESYLATE 4 MG PO TABS
4.0000 mg | ORAL_TABLET | Freq: Every day | ORAL | Status: DC
  Filled 2020-12-02: qty 1

## 2020-12-02 SURGICAL SUPPLY — 14 items
CATH INFINITI 5FR MULTPACK ANG (CATHETERS) ×1 IMPLANT
CATH VISTA GUIDE 6FR XB3.5 (CATHETERS) ×1 IMPLANT
CLOSURE MYNX CONTROL 5F (Vascular Products) IMPLANT
CLOSURE MYNX CONTROL 6F/7F (Vascular Products) ×1 IMPLANT
GUIDEWIRE PRESSURE X 175 (WIRE) ×2 IMPLANT
KIT ESSENTIALS PG (KITS) ×1 IMPLANT
KIT HEART LEFT (KITS) ×2 IMPLANT
PACK CARDIAC CATHETERIZATION (CUSTOM PROCEDURE TRAY) ×2 IMPLANT
SHEATH PINNACLE 5F 10CM (SHEATH) ×1 IMPLANT
SHEATH PINNACLE 6F 10CM (SHEATH) ×2 IMPLANT
SYR MEDRAD MARK 7 150ML (SYRINGE) ×2 IMPLANT
TRANSDUCER W/STOPCOCK (MISCELLANEOUS) ×2 IMPLANT
TUBING CIL FLEX 10 FLL-RA (TUBING) ×3 IMPLANT
WIRE EMERALD 3MM-J .035X150CM (WIRE) ×1 IMPLANT

## 2020-12-02 NOTE — Progress Notes (Signed)
Reassessed right groin. Continued to ooze. Held pressure for 20 minutes. No oozing observed after pressure was held. Redressed right groin. Will continue to monitor.

## 2020-12-02 NOTE — Progress Notes (Signed)
Pt continues to ooze off and on at Rt groin. No chest pain or complaints otherwise.  Discussed keeping him overnight and he was agreeable to this - he has had this issue in the past.  Currently has stopped oozing but has not been up yet still flat in bed

## 2020-12-02 NOTE — Progress Notes (Signed)
Assessed right femoral site. Level 1 hematoma observed with minimal oozing from site. Held pressure on femoral site for 5 minutes. Reassessed-site now level 0. Will continue to monitor.

## 2020-12-02 NOTE — Progress Notes (Signed)
After further observation and holding pressure for another 15 minutes and redressing area, right groin continues to slowly ooze. Dr. Gwenlyn Found notified. Was given orders to notify PA to assess patient. PA Ria Comment was paged. Awaiting further orders.

## 2020-12-02 NOTE — Progress Notes (Signed)
PA Mickel Baas assessed patient. Orders placed to admit patient to telemetry cardiac floor for observation. Bed request placed. Writer to move patient to [place] room 22. Awaiting clean bed to transfer patient.

## 2020-12-02 NOTE — Interval H&P Note (Signed)
Cath Lab Visit (complete for each Cath Lab visit)  Clinical Evaluation Leading to the Procedure:   ACS: Yes.    Non-ACS:    Anginal Classification: CCS II  Anti-ischemic medical therapy: Maximal Therapy (2 or more classes of medications)  Non-Invasive Test Results: No non-invasive testing performed  Prior CABG: Previous CABG      History and Physical Interval Note:  12/02/2020 1:19 PM  Jerry Farrell  has presented today for surgery, with the diagnosis of unstable angina.  The various methods of treatment have been discussed with the patient and family. After consideration of risks, benefits and other options for treatment, the patient has consented to  Procedure(s): LEFT HEART CATH AND CORS/GRAFTS ANGIOGRAPHY (N/A) as a surgical intervention.  The patient's history has been reviewed, patient examined, no change in status, stable for surgery.  I have reviewed the patient's chart and labs.  Questions were answered to the patient's satisfaction.     Quay Burow

## 2020-12-03 ENCOUNTER — Other Ambulatory Visit (HOSPITAL_COMMUNITY): Payer: Self-pay

## 2020-12-03 ENCOUNTER — Encounter (HOSPITAL_COMMUNITY): Payer: Self-pay | Admitting: Cardiovascular Disease

## 2020-12-03 DIAGNOSIS — E785 Hyperlipidemia, unspecified: Secondary | ICD-10-CM | POA: Diagnosis not present

## 2020-12-03 DIAGNOSIS — I10 Essential (primary) hypertension: Secondary | ICD-10-CM | POA: Diagnosis not present

## 2020-12-03 DIAGNOSIS — Z79899 Other long term (current) drug therapy: Secondary | ICD-10-CM | POA: Diagnosis not present

## 2020-12-03 DIAGNOSIS — I2582 Chronic total occlusion of coronary artery: Secondary | ICD-10-CM | POA: Diagnosis not present

## 2020-12-03 DIAGNOSIS — I25110 Atherosclerotic heart disease of native coronary artery with unstable angina pectoris: Secondary | ICD-10-CM | POA: Diagnosis not present

## 2020-12-03 DIAGNOSIS — Z7982 Long term (current) use of aspirin: Secondary | ICD-10-CM | POA: Diagnosis not present

## 2020-12-03 DIAGNOSIS — Z951 Presence of aortocoronary bypass graft: Secondary | ICD-10-CM | POA: Diagnosis not present

## 2020-12-03 DIAGNOSIS — I25118 Atherosclerotic heart disease of native coronary artery with other forms of angina pectoris: Secondary | ICD-10-CM

## 2020-12-03 LAB — BASIC METABOLIC PANEL
Anion gap: 8 (ref 5–15)
BUN: 12 mg/dL (ref 8–23)
CO2: 25 mmol/L (ref 22–32)
Calcium: 8.6 mg/dL — ABNORMAL LOW (ref 8.9–10.3)
Chloride: 105 mmol/L (ref 98–111)
Creatinine, Ser: 1.02 mg/dL (ref 0.61–1.24)
GFR, Estimated: >60 mL/min (ref >60)
Glucose, Bld: 96 mg/dL (ref 70–99)
Potassium: 3.5 mmol/L (ref 3.5–5.1)
Sodium: 138 mmol/L (ref 135–145)

## 2020-12-03 LAB — CBC
HCT: 42.7 % (ref 39.0–52.0)
Hemoglobin: 14.2 g/dL (ref 13.0–17.0)
MCH: 29.2 pg (ref 26.0–34.0)
MCHC: 33.3 g/dL (ref 30.0–36.0)
MCV: 87.7 fL (ref 80.0–100.0)
Platelets: 135 10*3/uL — ABNORMAL LOW (ref 150–400)
RBC: 4.87 MIL/uL (ref 4.22–5.81)
RDW: 13.2 % (ref 11.5–15.5)
WBC: 9.9 10*3/uL (ref 4.0–10.5)
nRBC: 0 % (ref 0.0–0.2)

## 2020-12-03 MED ORDER — ATORVASTATIN CALCIUM 40 MG PO TABS
40.0000 mg | ORAL_TABLET | Freq: Every day | ORAL | 3 refills | Status: DC
  Filled 2020-12-03: qty 30, 30d supply, fill #0

## 2020-12-03 MED ORDER — POTASSIUM CHLORIDE CRYS ER 20 MEQ PO TBCR: 40.0000 meq | EXTENDED_RELEASE_TABLET | Freq: Once | ORAL | Status: DC

## 2020-12-03 MED ORDER — ATORVASTATIN CALCIUM 40 MG PO TABS: 40.0000 mg | ORAL_TABLET | Freq: Every day | ORAL | Status: DC

## 2020-12-03 MED ORDER — TAMSULOSIN HCL 0.4 MG PO CAPS: 0.4000 mg | ORAL_CAPSULE | Freq: Every day | ORAL | Status: DC

## 2020-12-03 MED ORDER — DOXAZOSIN MESYLATE 4 MG PO TABS: 4.0000 mg | ORAL_TABLET | Freq: Every day | ORAL | Status: DC

## 2020-12-03 NOTE — Plan of Care (Signed)

## 2020-12-03 NOTE — Progress Notes (Signed)
CARDIAC REHAB PHASE I   PRE:  Rate/Rhythm: 54 SR    BP: sitting 135/72    SaO2:   MODE:  Ambulation: 550 ft   POST:  Rate/Rhythm: 91 SR    BP: sitting 153/80     SaO2:   Tolerated well, no c/o groin issues or CP. Sts his angina normally comes on with push mowing (doesn't happen with self propelled). Discussed restrictions, exercise, and NTG if given prescription (doesn't have any). Gave heart healthy diet. Pt and wife voiced understanding. N/a for CRPII. [id number]  [place], ACSM 12/03/2020 9:22 AM

## 2020-12-03 NOTE — Discharge Summary (Addendum)
Discharge Summary    Patient ID: Jerry Farrell MRN: [id number]; DOB: December 11, 1945  Admit date: 12/02/2020 Discharge date: 12/03/2020  PCP:  Abner Greenspan, MD   [facility] Providers Cardiologist:  Quay Burow, MD        Discharge Diagnoses    Active Problems:   Coronary artery disease   Chest pain of uncertain etiology   CAD in native artery   HTN   HLD   Diagnostic Studies/Procedures    LEFT HEART CATH AND CORS/GRAFTS ANGIOGRAPHY   Conclusion      Ost RCA to Prox RCA lesion is 100% stenosed.   Ost LAD to Mid LAD lesion is 100% stenosed.   2nd Diag lesion is 100% stenosed.   Origin to Prox Graft lesion is 100% stenosed.   Ramus lesion is 50% stenosed.   The left ventricular systolic function is normal.   The left ventricular ejection fraction is 55-65% by visual estimate.  IMPRESSION: Mr. Nilo has an occluded RCA as well as an RCA vein graft with grade 2-3 left to right collaterals with IMA injection through LAD septal perforators.  He is IMEs widely patent.  He does have moderate segmental proximal ramus branch disease with a negative RFR 0.92 suggesting this was not physiologically significant.  He has normal LV function.  Medical therapy will be recommended.  A Mynx closure device was successfully deployed.  The patient left lab in stable condition.  Plans will be for discharge home later today as an outpatient.  I will see him back in the office in 2 to 3 weeks.    Diagnostic Dominance: Right  History of Present Illness     Jerry Farrell is a 75 y.o. male with with history of CAD s/p CABG, hypertension and hyperlipidemia presented for outpatient cardiac catheterization.  Had a normal Myoview 24 March 2016.  Reestablish care with Dr. Gwenlyn Found February 2022.  Most recently seen on August 10.  He complained of exertional chest tightness, shortness of breath and fatigue.  His symptoms was concerning for angina and cardiac catheterization arranged.  Hospital  Course     Consultants: None  Cardiac catheterization as noted above showing occluded RCA and occluded RCA with graft.  Moderate segmental proximal ramus branch stenosis which was negative with RFR 0.92.  Collaterals to second diagonal.  Medical therapy recommended.  Patient had oozing from right groin leading to overnight observation.  Groin site looks stable in the morning without gross hematoma.  Will ambulate with cardiac rehab.  LDL 65 but given about disease Lipitor titrated to 40 mg daily.  Otherwise no change in medications.  Recommended simplifying antihypertensive regimen in outpatient setting (DC hydralazine and uptitrate Coreg).    Did the patient have an acute coronary syndrome (MI, NSTEMI, STEMI, etc) this admission?:  No                               Did the patient have a percutaneous coronary intervention (stent / angioplasty)?:  No.     Discharge Vitals Blood pressure 135/72, pulse 66, temperature 97.9 F (36.6 C), temperature source Oral, resp. rate 18, height '5\' 8"'$  (1.727 m), weight 83.6 kg, SpO2 98 %.  Filed Weights   12/02/20 1130  Weight: 83.6 kg   Physical Exam Constitutional:      Appearance: Normal appearance.  HENT:     Head: Normocephalic.     Nose: Nose normal.  Eyes:  Pupils: Pupils are equal, round, and reactive to light.  Cardiovascular:     Rate and Rhythm: Normal rate and regular rhythm.     Comments: Right groin without hematoma or ecchymosis Pulmonary:     Effort: Pulmonary effort is normal.     Breath sounds: Normal breath sounds.  Abdominal:     General: Abdomen is flat.     Palpations: Abdomen is soft.  Musculoskeletal:        General: Normal range of motion.     Cervical back: Normal range of motion.  Skin:    General: Skin is warm and dry.  Neurological:     General: No focal deficit present.     Mental Status: He is alert and oriented to person, place, and time.  Psychiatric:        Mood and Affect: Mood normal.        Behavior:  Behavior normal.    Labs & Radiologic Studies    CBC Recent Labs    12/01/20 0943 12/03/20 0150  WBC 8.3 9.9  HGB 16.0 14.2  HCT 47.6 42.7  MCV 86 87.7  PLT 147* [id number]*   Basic Metabolic Panel Recent Labs    12/01/20 0943 12/03/20 0150  NA 142 138  K 4.3 3.5  CL 105 105  CO2 22 25  GLUCOSE 105* 96  BUN 10 12  CREATININE 1.06 1.02  CALCIUM 9.0 8.6*    _____________  CARDIAC CATHETERIZATION  Result Date: 12/02/2020 Images from the original result were not included.   Ost RCA to Prox RCA lesion is 100% stenosed.   Ost LAD to Mid LAD lesion is 100% stenosed.   2nd Diag lesion is 100% stenosed.   Origin to Prox Graft lesion is 100% stenosed.   Ramus lesion is 50% stenosed.   The left ventricular systolic function is normal.   The left ventricular ejection fraction is 55-65% by visual estimate. Jerry Farrell is a 75 y.o. male  [id number] LOCATION:  FACILITY: [facility] PHYSICIAN: Quay Burow, M.D. 1945-07-26 DATE OF PROCEDURE:  12/02/2020 DATE OF DISCHARGE: CARDIAC CATHETERIZATION History obtained from chart review.Jerry Farrell is a 75 y.o.   thin-appearing married African-American male father of 59, grandfather and 5 grandchildren who is referred back to me by Dr. Glori Bickers, his PCP, to be reestablished in my practice. I last saw him in the office 06/18/2020.Marland Kitchen His risk factors include treated hypertension and hyperlipidemia. There is is a family history of a brother who recently had bypass surgery. He has never smoked. Retired from [employer]. He had coronary artery bypass grafting X 1  in 1994 for what sounds like left main disease. He denies chest pain or shortness of breath. He did see how main in the office 06/02/16 after having had a Myoview stress test 04/11/16 that was essentially normal with no evidence of ischemia.  Since I saw him in the office 6 months ago he has developed exertional chest tightness and dyspnea as well as fatigue.  He did not have the symptoms when I saw him back in  February. PROCEDURE DESCRIPTION: The patient was brought to the second floor [place]iac cath lab in the postabsorptive state. He was not premedicated . His right groin was prepped and shaved in usual sterile fashion. Xylocaine 1% was used for local anesthesia. A 5 upgraded to 6 French sheath was inserted into the right common femoral artery using standard Seldinger technique.  5 French right left Judkins diagnostic catheters over the 5  French pigtail catheter were used for selective coronary angiography, selective vein graft and IMA graft angiography and left ventriculography.  Isovue dye was used for the entirety of the case (130 cc of contrast total administered to patient).  Retrograde aortic, left ventricular and pullback pressures were recorded. A RFR was performed on the ramus branch because of a long moderate proximal segmental lesion.  The patient received a total of 9000's of heparin with an ACT of 312.  Using a 6 [place] XB 3-1/2 cm guide catheter along with a Abbott FloWire, the wire was normalized and passed across the proximal segmental ramus branch disease segment.  The RFR on 2 occasions was 0.92 suggesting this was not physiologically significant. A right common femoral angiogram was performed and a MYNX  closure device was successfully deployed achieving hemostasis.   Mr. Matsuda has an occluded RCA as well as an RCA vein graft with grade 2-3 left to right collaterals with IMA injection through LAD septal perforators.  He is IMEs widely patent.  He does have moderate segmental proximal ramus branch disease with a negative RFR 0.92 suggesting this was not physiologically significant.  He has normal LV function.  Medical therapy will be recommended.  A Mynx closure device was successfully deployed.  The patient left lab in stable condition.  Plans will be for discharge home later today as an outpatient.  I will see him back in the office in 2 to 3 weeks. Quay Burow. MD, [facility] 12/02/2020 2:42  PM    Disposition   Pt is being discharged home today in good condition.  Follow-up Plans & Appointments     Follow-up Information     Lorretta Harp, MD Follow up on 12/21/2020.   Specialties: Cardiology, Radiology Why: '@9am'$  for hospital follow up Contact information: [address] [place] [address] [phone]                Discharge Instructions     Diet - low sodium heart healthy   Complete by: As directed    Discharge instructions   Complete by: As directed    No driving for 48 hours. No lifting over 5 lbs for 1 week. No sexual activity for 1 week. You may return to work on 12/07/20. Keep procedure site clean & dry. If you notice increased pain, swelling, bleeding or pus, call/return!  You may shower, but no soaking baths/hot tubs/pools for 1 week.   Increase activity slowly   Complete by: As directed        Discharge Medications   Allergies as of 12/03/2020   No Known Allergies      Medication List     TAKE these medications    aluminum hydroxide-magnesium carbonate 95-358 MG/15ML Susp Commonly known as: GAVISCON Take 15 mLs by mouth as needed for indigestion.   amLODipine 5 MG tablet Commonly known as: NORVASC Take 0.5 tablets by mouth 2 (two) times daily.   aspirin EC 81 MG tablet Take 1 tablet (81 mg total) by mouth daily.   atorvastatin 40 MG tablet Commonly known as: LIPITOR Take 1 tablet (40 mg total) by mouth daily. Start taking on: December 04, 2020 What changed:  medication strength how much to take   carvedilol 3.125 MG tablet Commonly known as: COREG TAKE 1 TABLET BY MOUTH 2 TIMES DAILY WITH A MEAL.   doxazosin 4 MG tablet Commonly known as: CARDURA Take 1 tablet (4 mg total) by mouth at bedtime.   hydrALAZINE 25  MG tablet Commonly known as: APRESOLINE Take 1 tablet (25 mg total) by mouth 3 (three) times daily. What changed: when to take this   pantoprazole 40 MG tablet Commonly known as:  PROTONIX TAKE 1 TABLET BY MOUTH EVERY DAY   Robafen CF Multi-Symptom Cold 5-10-100 MG/5ML Liqd Generic drug: Phenylephrine-DM-GG Take 5 mLs by mouth daily as needed (cough).   SYSTANE OP Place 1 drop into both eyes daily as needed (dry eyes).   tamsulosin 0.4 MG Caps capsule Commonly known as: FLOMAX Take 1 capsule (0.4 mg total) by mouth at bedtime.   valsartan 320 MG tablet Commonly known as: DIOVAN TAKE 1 TABLET BY MOUTH EVERY DAY           Outstanding Labs/Studies   Consider OP f/u labs 6-8 weeks given statin initiation this admission.   Duration of Discharge Encounter   Greater than 30 minutes including physician time.  SignedCrista Luria [place], PA 12/03/2020, 9:25 AM    I have personally seen and examined this patient. I agree with the assessment and plan as outlined above.  Pt monitored overnight due to groin bleeding post diagnostic cath. Groin stable today. See above. Cardiac cath with no targets for PCI. Plan medical management of CAD and f/u with Dr. Gwenlyn Found.   Lauree Chandler, MD, [facility] 12/03/2020 9:41 AM

## 2020-12-03 NOTE — Plan of Care (Signed)
  Problem: Education: Goal: Understanding of CV disease, CV risk reduction, and recovery process will improve 12/03/2020 1011 by Camillia Herter, RN Outcome: Adequate for Discharge 12/03/2020 0741 by Camillia Herter, RN Outcome: Progressing 12/03/2020 0729 by Camillia Herter, RN Outcome: Progressing Goal: Individualized Educational Video(s) 12/03/2020 1011 by Camillia Herter, RN Outcome: Adequate for Discharge 12/03/2020 0741 by Camillia Herter, RN Outcome: Progressing 12/03/2020 0729 by Camillia Herter, RN Outcome: Progressing   Problem: Activity: Goal: Ability to return to baseline activity level will improve 12/03/2020 1011 by Camillia Herter, RN Outcome: Adequate for Discharge 12/03/2020 0741 by Camillia Herter, RN Outcome: Progressing 12/03/2020 0729 by Camillia Herter, RN Outcome: Progressing   Problem: Cardiovascular: Goal: Ability to achieve and maintain adequate cardiovascular perfusion will improve 12/03/2020 1011 by Camillia Herter, RN Outcome: Adequate for Discharge 12/03/2020 0741 by Camillia Herter, RN Outcome: Progressing 12/03/2020 0729 by Camillia Herter, RN Outcome: Progressing Goal: Vascular access site(s) Level 0-1 will be maintained 12/03/2020 1011 by Camillia Herter, RN Outcome: Adequate for Discharge 12/03/2020 0741 by Camillia Herter, RN Outcome: Progressing 12/03/2020 0729 by Camillia Herter, RN Outcome: Progressing   Problem: Health Behavior/Discharge Planning: Goal: Ability to safely manage health-related needs after discharge will improve 12/03/2020 1011 by Camillia Herter, RN Outcome: Adequate for Discharge 12/03/2020 0741 by Camillia Herter, RN Outcome: Progressing 12/03/2020 0729 by Camillia Herter, RN Outcome: Progressing

## 2020-12-03 NOTE — Plan of Care (Signed)
  Problem: Education: Goal: Understanding of CV disease, CV risk reduction, and recovery process will improve Outcome: Progressing   Problem: Activity: Goal: Ability to return to baseline activity level will improve Outcome: Progressing   Problem: Cardiovascular: Goal: Ability to achieve and maintain adequate cardiovascular perfusion will improve Outcome: Progressing   Problem: Health Behavior/Discharge Planning: Goal: Ability to safely manage health-related needs after discharge will improve Outcome: Progressing   

## 2020-12-03 NOTE — Plan of Care (Signed)
  Problem: Education: Goal: Understanding of CV disease, CV risk reduction, and recovery process will improve 12/03/2020 0741 by Camillia Herter, RN Outcome: Progressing 12/03/2020 0729 by Camillia Herter, RN Outcome: Progressing Goal: Individualized Educational Video(s) 12/03/2020 0741 by Camillia Herter, RN Outcome: Progressing 12/03/2020 0729 by Camillia Herter, RN Outcome: Progressing   Problem: Activity: Goal: Ability to return to baseline activity level will improve 12/03/2020 0741 by Camillia Herter, RN Outcome: Progressing 12/03/2020 0729 by Camillia Herter, RN Outcome: Progressing   Problem: Cardiovascular: Goal: Ability to achieve and maintain adequate cardiovascular perfusion will improve 12/03/2020 0741 by Camillia Herter, RN Outcome: Progressing 12/03/2020 0729 by Camillia Herter, RN Outcome: Progressing Goal: Vascular access site(s) Level 0-1 will be maintained 12/03/2020 0741 by Camillia Herter, RN Outcome: Progressing 12/03/2020 0729 by Camillia Herter, RN Outcome: Progressing   Problem: Health Behavior/Discharge Planning: Goal: Ability to safely manage health-related needs after discharge will improve 12/03/2020 0741 by Camillia Herter, RN Outcome: Progressing 12/03/2020 0729 by Camillia Herter, RN Outcome: Progressing

## 2020-12-07 ENCOUNTER — Encounter (HOSPITAL_COMMUNITY): Payer: Self-pay | Admitting: Cardiovascular Disease

## 2020-12-20 ENCOUNTER — Other Ambulatory Visit: Payer: Self-pay | Admitting: Physician Assistant

## 2020-12-21 ENCOUNTER — Encounter: Payer: Self-pay | Admitting: Cardiovascular Disease

## 2020-12-21 ENCOUNTER — Ambulatory Visit: Payer: Medicare Other | Admitting: Cardiovascular Disease

## 2020-12-21 ENCOUNTER — Other Ambulatory Visit: Payer: Self-pay

## 2020-12-21 VITALS — BP 157/67 | HR 70 | Ht 68.0 in | Wt 181.4 lb

## 2020-12-21 DIAGNOSIS — Z951 Presence of aortocoronary bypass graft: Secondary | ICD-10-CM

## 2020-12-21 DIAGNOSIS — I10 Essential (primary) hypertension: Secondary | ICD-10-CM | POA: Diagnosis not present

## 2020-12-21 DIAGNOSIS — E785 Hyperlipidemia, unspecified: Secondary | ICD-10-CM

## 2020-12-21 NOTE — Assessment & Plan Note (Signed)
History of CAD status post CABG in 1994.  Because of exertional chest pain, dyspnea and fatigue I did perform outpatient diagnostic coronary angiography on him 12/02/2020 revealing normal LV systolic function, and occluded proximal LAD with a patent LIMA.  He had left to left collaterals from circumflex to an occluded diagonal branch.  He had an occluded RCA with an occluded vein graft to the RCA.  Since his procedure he denies recurrent chest pain or dyspnea.

## 2020-12-21 NOTE — Patient Instructions (Signed)

## 2020-12-21 NOTE — Assessment & Plan Note (Signed)
History of dyslipidemia on atorvastatin with lipid profile performed 09/30/2020 revealing total cholesterol of 138, LDL 65 and HDL 46.

## 2020-12-21 NOTE — Assessment & Plan Note (Signed)
History of essential hypertension a blood pressure measured today at 157/67 although at home runs in the 120/70 range.  He is on carvedilol, valsartan and amlodipine.

## 2020-12-21 NOTE — Progress Notes (Signed)
12/21/2020 Jerry Farrell   [birth year]  [id number]  Primary Physician [place], Wynelle Fanny, MD Primary Cardiologist: Jerry Harp MD Jerry Farrell, [place]  HPI:  Jerry Farrell is a 75 y.o.  thin-appearing married African-American male father of 5, grandfather and 5 grandchildren who is referred back to me by Dr. Glori Farrell, his PCP, to be reestablished in my practice. I last saw him in the office 12/01/2020.Marland Kitchen  He is accompanied by his wife Jerry Farrell  today.  His risk factors include treated hypertension and hyperlipidemia. There is is a family history of a brother who recently had bypass surgery. He has never smoked. Retired from [employer]. He had coronary artery bypass grafting X 1  in 1994 for what sounds like left main disease. He denies chest pain or shortness of breath. He did see how main in the office 06/02/16 after having had a Myoview stress test 04/11/16 that was essentially normal with no evidence of ischemia.    He had developed exertional chest tightness and dyspnea as well as fatigue.  He did not have the symptoms when I saw him back in February of 2022.  Based on this, I decided to proceed with elective outpatient diagnostic coronary angiography on 12/02/2020 via the femoral approach.  I demonstrated normal LV systolic function, and occluded proximal LAD with a patent LIMA to the LAD, left to left collaterals from the circumflex to an occluded diagonal branch, and an occluded native RCA and RCA vein graft.  Since his procedure is symptoms of chest pain and dyspnea have resolved.   Current Meds  Medication Sig   aluminum hydroxide-magnesium carbonate (GAVISCON) 95-358 MG/15ML SUSP Take 15 mLs by mouth as needed for indigestion.   amLODipine (NORVASC) 5 MG tablet Take 0.5 tablets by mouth 2 (two) times daily.   aspirin EC 81 MG tablet Take 1 tablet (81 mg total) by mouth daily.   atorvastatin (LIPITOR) 40 MG tablet Take 1 tablet (40 mg total) by mouth daily.   carvedilol (COREG) 3.125 MG  tablet TAKE 1 TABLET BY MOUTH 2 TIMES DAILY WITH A MEAL.   doxazosin (CARDURA) 4 MG tablet Take 1 tablet (4 mg total) by mouth at bedtime.   pantoprazole (PROTONIX) 40 MG tablet TAKE 1 TABLET BY MOUTH EVERY DAY   Phenylephrine-DM-GG (ROBAFEN CF MULTI-SYMPTOM COLD) 5-10-100 MG/5ML LIQD Take 5 mLs by mouth daily as needed (cough).   Polyethyl Glycol-Propyl Glycol (SYSTANE OP) Place 1 drop into both eyes daily as needed (dry eyes).   tamsulosin (FLOMAX) 0.4 MG CAPS capsule Take 1 capsule (0.4 mg total) by mouth at bedtime.   valsartan (DIOVAN) 320 MG tablet TAKE 1 TABLET BY MOUTH EVERY DAY     No Known Allergies  Social History   Socioeconomic History   Marital status: Married    Spouse name: Not on file   Number of children: 3   Years of education: Not on file   Highest education level: Not on file  Occupational History   Occupation: [employer]    Employer: RETIRED  Tobacco Use   Smoking status: Never   Smokeless tobacco: Never  Vaping Use   Vaping Use: Never used  Substance and Sexual Activity   Alcohol use: No    Alcohol/week: 0.0 standard drinks   Drug use: No   Sexual activity: Yes  Other Topics Concern   Not on file  Social History Narrative   Not on file   Social Determinants of Health   Financial  Resource Strain: Low Risk    Difficulty of Paying Living Expenses: Not hard at all  Food Insecurity: No Food Insecurity   Worried About Charity fundraiser in the Last Year: Never true   Ran Out of Food in the Last Year: Never true  Transportation Needs: No Transportation Needs   Lack of Transportation (Medical): No   Lack of Transportation (Non-Medical): No  Physical Activity: Sufficiently Active   Days of Exercise per Week: 3 days   Minutes of Exercise per Session: 60 min  Stress: No Stress Concern Present   Feeling of Stress : Not at all  Social Connections: Not on file  Intimate Partner Violence: Not At Risk   Fear of Current or Ex-Partner: No   Emotionally  Abused: No   Physically Abused: No   Sexually Abused: No     Review of Systems: General: negative for chills, fever, night sweats or weight changes.  Cardiovascular: negative for chest pain, dyspnea on exertion, edema, orthopnea, palpitations, paroxysmal nocturnal dyspnea or shortness of breath Dermatological: negative for rash Respiratory: negative for cough or wheezing Urologic: negative for hematuria Abdominal: negative for nausea, vomiting, diarrhea, bright red blood per rectum, melena, or hematemesis Neurologic: negative for visual changes, syncope, or dizziness All other systems reviewed and are otherwise negative except as noted above.    Blood pressure (!) 157/67, pulse 70, height '5\' 8"'$  (1.727 m), weight 181 lb 6.4 oz (82.3 kg), SpO2 98 %.  General appearance: alert and no distress Neck: no adenopathy, no JVD, supple, symmetrical, trachea midline, thyroid not enlarged, symmetric, no tenderness/mass/nodules, and soft high-frequency right carotid bruit Lungs: clear to auscultation bilaterally Heart: regular rate and rhythm, S1, S2 normal, no murmur, click, rub or gallop Extremities: extremities normal, atraumatic, no cyanosis or edema Pulses: 2+ and symmetric Skin: Skin color, texture, turgor normal. No rashes or lesions Neurologic: Grossly normal  EKG sinus rhythm at 70 with bigeminal PVCs.  I personally reviewed this EKG.  ASSESSMENT AND PLAN:   Dyslipidemia, goal LDL below 70 History of dyslipidemia on atorvastatin with lipid profile performed 09/30/2020 revealing total cholesterol of 138, LDL 65 and HDL 46.  Essential hypertension History of essential hypertension a blood pressure measured today at 157/67 although at home runs in the 120/70 range.  He is on carvedilol, valsartan and amlodipine.  Hx of CABG History of CAD status post CABG in 1994.  Because of exertional chest pain, dyspnea and fatigue I did perform outpatient diagnostic coronary angiography on him  12/02/2020 revealing normal LV systolic function, and occluded proximal LAD with a patent LIMA.  He had left to left collaterals from circumflex to an occluded diagonal branch.  He had an occluded RCA with an occluded vein graft to the RCA.  Since his procedure he denies recurrent chest pain or dyspnea.     Jerry Harp MD FACP,FACC,FAHA, [facility] 12/21/2020 9:40 AM

## 2021-01-03 ENCOUNTER — Other Ambulatory Visit: Payer: Self-pay

## 2021-01-03 MED ORDER — AMLODIPINE BESYLATE 5 MG PO TABS: 2.5000 mg | ORAL_TABLET | Freq: Two times a day (BID) | ORAL | 3 refills | Status: DC

## 2021-01-19 ENCOUNTER — Other Ambulatory Visit: Payer: Self-pay | Admitting: Cardiovascular Disease

## 2021-01-19 ENCOUNTER — Telehealth: Payer: Self-pay | Admitting: Cardiovascular Disease

## 2021-01-19 MED ORDER — HYDRALAZINE HCL 25 MG PO TABS: 25.0000 mg | ORAL_TABLET | Freq: Two times a day (BID) | ORAL | 3 refills | Status: DC

## 2021-01-19 NOTE — Telephone Encounter (Signed)
Refills has been sent to the pharmacy. 

## 2021-01-19 NOTE — Telephone Encounter (Signed)
*  STAT* If patient is at the pharmacy, call can be transferred to refill team.   1. Which medications need to be refilled? (please list name of each medication and dose if known)  hydrALAZINE (APRESOLINE) 25 MG tablet  2. Which pharmacy/location (including street and city if local pharmacy) is medication to be sent to? [id number] [place], [place] - [address]  3. Do they need a 30 day or 90 day supply?  90 day supply

## 2021-02-02 ENCOUNTER — Telehealth: Payer: Self-pay | Admitting: Cardiovascular Disease

## 2021-02-02 MED ORDER — ATORVASTATIN CALCIUM 40 MG PO TABS: 40.0000 mg | ORAL_TABLET | Freq: Every day | ORAL | 3 refills | Status: DC

## 2021-02-02 NOTE — Telephone Encounter (Signed)
*  STAT* If patient is at the pharmacy, call can be transferred to refill team.   1. Which medications need to be refilled? (please list name of each medication and dose if known)  atorvastatin (LIPITOR) 40 MG tablet  2. Which pharmacy/location (including street and city if local pharmacy) is medication to be sent to? [id number] [place], [place] - [address]  3. Do they need a 30 day or 90 day supply? 90 with refills  Patient does not use the [facility] pharmacy.  [facility]

## 2021-02-08 ENCOUNTER — Ambulatory Visit: Payer: Medicare Other | Attending: Internal Medicine

## 2021-02-08 ENCOUNTER — Other Ambulatory Visit (HOSPITAL_BASED_OUTPATIENT_CLINIC_OR_DEPARTMENT_OTHER): Payer: Self-pay

## 2021-02-08 DIAGNOSIS — Z23 Encounter for immunization: Secondary | ICD-10-CM

## 2021-02-08 MED ORDER — PFIZER COVID-19 VAC BIVALENT 30 MCG/0.3ML IM SUSP
INTRAMUSCULAR | 0 refills | Status: DC
  Filled 2021-02-08: qty 0.3, 1d supply, fill #0

## 2021-02-08 NOTE — Progress Notes (Signed)
   Covid-19 Vaccination Clinic  Name:  Jerry Farrell    MRN: [id number] DOB: 05/18/1945  02/08/2021  Jerry Farrell was observed post Covid-19 immunization for 15 minutes without incident. He was provided with Vaccine Information Sheet and instruction to access the V-Safe system.   Jerry Farrell was instructed to call 911 with any severe reactions post vaccine: Difficulty breathing  Swelling of face and throat  A fast heartbeat  A bad rash all over body  Dizziness and weakness

## 2021-02-16 ENCOUNTER — Other Ambulatory Visit (HOSPITAL_BASED_OUTPATIENT_CLINIC_OR_DEPARTMENT_OTHER): Payer: Self-pay

## 2021-02-16 MED ORDER — INFLUENZA VAC A&B SA ADJ QUAD 0.5 ML IM PRSY
PREFILLED_SYRINGE | INTRAMUSCULAR | 0 refills | Status: DC
  Filled 2021-02-16: qty 0.5, 1d supply, fill #0

## 2021-04-04 ENCOUNTER — Encounter: Payer: Self-pay | Admitting: Gastroenterology

## 2021-05-01 ENCOUNTER — Other Ambulatory Visit: Payer: Self-pay | Admitting: Family Medicine

## 2021-05-03 NOTE — Telephone Encounter (Signed)
Refill request Flomax Last office visit 10/07/20  Patient sees urology

## 2021-05-09 ENCOUNTER — Other Ambulatory Visit: Payer: Self-pay

## 2021-05-09 ENCOUNTER — Telehealth: Payer: Self-pay | Admitting: Gastroenterology

## 2021-05-09 NOTE — Telephone Encounter (Signed)
Patient called states he is having a lot of abdominal pain and burning hemorrhoids.

## 2021-05-09 NOTE — Telephone Encounter (Signed)
Patient will come in and see Carl Best, RNP tomorrow at 1:30

## 2021-05-10 ENCOUNTER — Ambulatory Visit: Payer: Medicare Other | Admitting: Nurse Practitioner

## 2021-05-10 ENCOUNTER — Other Ambulatory Visit (INDEPENDENT_AMBULATORY_CARE_PROVIDER_SITE_OTHER): Payer: Medicare Other

## 2021-05-10 ENCOUNTER — Encounter: Payer: Self-pay | Admitting: Nurse Practitioner

## 2021-05-10 ENCOUNTER — Other Ambulatory Visit: Payer: Self-pay | Admitting: Nurse Practitioner

## 2021-05-10 VITALS — BP 150/60 | HR 72 | Ht 67.5 in | Wt 185.1 lb

## 2021-05-10 DIAGNOSIS — Z86010 Personal history of colonic polyps: Secondary | ICD-10-CM

## 2021-05-10 DIAGNOSIS — K648 Other hemorrhoids: Secondary | ICD-10-CM | POA: Diagnosis not present

## 2021-05-10 DIAGNOSIS — R1030 Lower abdominal pain, unspecified: Secondary | ICD-10-CM | POA: Diagnosis not present

## 2021-05-10 LAB — COMPREHENSIVE METABOLIC PANEL
ALT: 36 U/L (ref 0–53)
AST: 23 U/L (ref 0–37)
Albumin: 4.5 g/dL (ref 3.5–5.2)
Alkaline Phosphatase: 127 U/L — ABNORMAL HIGH (ref 39–117)
BUN: 14 mg/dL (ref 6–23)
CO2: 28 mEq/L (ref 19–32)
Calcium: 9 mg/dL (ref 8.4–10.5)
Chloride: 103 mEq/L (ref 96–112)
Creatinine, Ser: 0.97 mg/dL (ref 0.40–1.50)
GFR: 75.12 mL/min (ref >60.00)
Glucose, Bld: 91 mg/dL (ref 70–99)
Potassium: 4.1 mEq/L (ref 3.5–5.1)
Sodium: 139 mEq/L (ref 135–145)
Total Bilirubin: 1.5 mg/dL — ABNORMAL HIGH (ref 0.2–1.2)
Total Protein: 7.3 g/dL (ref 6.0–8.3)

## 2021-05-10 LAB — CBC WITH DIFFERENTIAL/PLATELET
Basophils Absolute: 0 10*3/uL (ref 0.0–0.1)
Basophils Relative: 0.5 % (ref 0.0–3.0)
Eosinophils Absolute: 0 10*3/uL (ref 0.0–0.7)
Eosinophils Relative: 0.5 % (ref 0.0–5.0)
HCT: 46.5 % (ref 39.0–52.0)
Hemoglobin: 15.3 g/dL (ref 13.0–17.0)
Lymphocytes Relative: 16.1 % (ref 12.0–46.0)
Lymphs Abs: 1.2 10*3/uL (ref 0.7–4.0)
MCHC: 33 g/dL (ref 30.0–36.0)
MCV: 87 fl (ref 78.0–100.0)
Monocytes Absolute: 0.5 10*3/uL (ref 0.1–1.0)
Monocytes Relative: 6.9 % (ref 3.0–12.0)
Neutro Abs: 5.6 10*3/uL (ref 1.4–7.7)
Neutrophils Relative %: 76 % (ref 43.0–77.0)
Platelets: 128 10*3/uL — ABNORMAL LOW (ref 150.0–400.0)
RBC: 5.34 Mil/uL (ref 4.22–5.81)
RDW: 13.8 % (ref 11.5–15.5)
WBC: 7.3 10*3/uL (ref 4.0–10.5)

## 2021-05-10 MED ORDER — HYDROCORTISONE ACETATE 25 MG RE SUPP: 25.0000 mg | Freq: Every evening | RECTAL | 0 refills | Status: AC

## 2021-05-10 MED ORDER — NA SULFATE-K SULFATE-MG SULF 17.5-3.13-1.6 GM/177ML PO SOLN: 1.0000 | ORAL | 0 refills | Status: DC

## 2021-05-10 NOTE — Progress Notes (Signed)
Given his health status at age 76 and the polyp surveillance guideline changes, which would place his next colonoscopy at 7-10 years from his last colonoscopy and not 5 years, I do not feel the benefits outweigh the risks for a colon polyp surveillance colonoscopy. If there is a clinically significant finding on the CT AP that indicates a need for colonoscopy we will need to discuss.

## 2021-05-10 NOTE — Patient Instructions (Addendum)
It was great seeing you today! Thank you for entrusting me with your care and choosing [facility].  Noralyn Pick, CRNP  IMAGING: You will be contacted by [facility] Scheduling (Your caller ID will indicate phone # [phone]) in the next 7 days to schedule your Abdominal CT Scan. If you have not heard from them within 7 business days, please call [place] at [phone] to follow up on the status of your appointment.    PROCEDURES: You have been scheduled for a colonoscopy. Please follow the written instructions given to you at your visit today. Please pick up your prep supplies at the pharmacy within the next 1-3 days. If you use inhalers (even only as needed), please bring them with you on the day of your procedure.  MEDICATION: We have sent the following medication to your pharmacy for you to pick up at your convenience: Anusol rectal suppositories, use at night for 5 nights.  RECOMMENDATIONS: Miralax- Dissolve one capful in 8 ounces of water and drink before bed. Please contact our office if your symptoms worsen. LABS:  Lab work has been ordered for you today. Our lab is located in the basement. Press "B" on the elevator. The lab is located at the first door on the left as you exit the elevator.  HEALTHCARE LAWS AND MY CHART RESULTS: Due to recent changes in healthcare laws, you may see the results of your imaging and laboratory studies on MyChart before your provider has had a chance to review them.   We understand that in some cases there may be results that are confusing or concerning to you. Not all laboratory results come back in the same time frame and the provider may be waiting for multiple results in order to interpret others.  Please give [place] 48 hours in order for your provider to thoroughly review all the results before contacting the office for clarification of your results.   The [facility] providers would like to  encourage you to use [facility] to communicate with providers for non-urgent requests or questions.  Due to long hold times on the telephone, sending your provider a message by [facility] may be faster and more efficient way to get a response. Please allow 48 business hours for a response.  Please remember that this is for non-urgent requests/questions.  If you are age 78 or older, your body mass index should be between 23-30. Your Body mass index is 28.57 kg/m. If this is out of the aforementioned range listed, please consider follow up with your Primary Care Provider.  If you are age 78 or younger, your body mass index should be between 19-25. Your Body mass index is 28.57 kg/m. If this is out of the aformentioned range listed, please consider follow up with your Primary Care Provider.

## 2021-05-10 NOTE — Progress Notes (Signed)
05/10/2021 Jerry Farrell [id number] Dec 07, 1945   CHIEF COMPLAINT: Lower abdominal pain, anal itchiness  HISTORY OF PRESENT ILLNESS: Jerry Farrell is a 76 year old male with a past medical history of hypertension, hyperlipidemia, coronary artery disease s/p single vessel CABG 1994, gallstones, chronic lower back pain, PUD s/p partial gastrectomy 1980's, + H pylori treated with  Pylera 05/2016, GI bleed secondary to a gastric ulcer 05/2016 and colon polyps. He presents to our office today for or further evaluation regarding lower abdominal pain and anal itchiness.  He is a challenging historian, he has difficulty describing his symptoms.  He complains of having lower abdominal pain which sometimes is more noticeable to the LLQ area and when sitting which started approximately 1 month ago.  His abdominal pain comes and goes throughout the day does not awaken him from sleep.  No fevers or weight loss.  He is passing a normal formed brown bowel movement daily, however, he occasionally strains to pass a bowel movement.  He complains of anal itchiness.  No rectal bleeding.  He took Pepto-Bismol 2 days ago which resulted in 1 black stool.  His stool today was normal brown in color.  His most recent colonoscopy was 02/15/2016 which identified one 4 mm polyp which was removed from the cecum.  He is prescheduled for a colonoscopy with Dr. Fuller Plan on 05/31/2021.  He remains on Pantoprazole 40 mg daily.  He denies having any heartburn, dysphagia or upper abdominal pain.  He is on ASA 81 mg daily.  No other NSAIDs.  His most recent EGD was 06/07/2016 during his hospital admission for GI bleed.  The EGD identified a gastric ulcer with an adherent clot and a clip was placed with successful hemostasis.  An EGD 06/01/2016 showed gastritis without evidence of an ulcer at that time and biopsies were positive for H pylori.  He was treated with Pylera and PPI twice daily.  Posttreatment H. pylori stool antigen testing was not done  as he remained on PPI.  He has a history of coronary artery disease s/p single vessel CABG in 1994.  He underwent a cardiac angiography by Dr. Donnella Bi 12/02/2020 due to having chest pain, dyspnea and fatigue which showed normal LV systolic function, and occluded proximal LAD with a patent LIMA.  He had left to left collaterals from circumflex to an occluded diagonal branch.  He had an occluded RCA with an occluded vein graft to the RCA.  He was instructed to continue Lipitor, Carvedilol, Valsartan and Amlodipine.  He denies having any chest pain or dyspnea since having his cardiac angiography.  EGD 06/07/2016 during his hospital admission for GI bleed and anemia: - Normal esophagus. - Billroth I gastroduodenostomy was found. - Gastric ulcer with adherent clot. Clips (MR conditional) were placed. - Normal examined duodenum. - No specimens collected.  EGD 06/01/2016 done due to having dysphagia: - No endoscopic esophageal abnormality to explain patient's dysphagia. Esophagus dilated. - Patent Billroth I gastroduodenostomy was found, characterized by erythema. - Gastritis. Biopsied. - Normal second portion of the duodenum. - CHRONIC GASTRITIS. - WARTHIN-STARRY STAIN POSITIVE FOR HELICOBACTER PYLORI.  Colonoscopy 02/15/2016: -One 4 mm polyp in the cecum, removed with a cold biopsy forceps. Resected and retrieved. - Internal hemorrhoids. - The examination was otherwise normal on direct and retroflexion views - 5 years - TUBULAR ADENOMA. NO HIGH GRADE DYSPLASIA OR MALIGNANCY IDENTIFIED.  Cardiac catheterization 12/02/2020:   Ost RCA to Prox RCA lesion is 100% stenosed.   Colon Flattery  LAD to Mid LAD lesion is 100% stenosed.   2nd Diag lesion is 100% stenosed.   Origin to Prox Graft lesion is 100% stenosed.   Ramus lesion is 50% stenosed.   The left ventricular systolic function is normal.   The left ventricular ejection fraction is 55-65% by visual estimate.  Abdominal sonogram 07/02/2019: 1.  Cholelithiasis without acute cholecystitis or biliary duct dilatation. 2. Hepatic lesions which are likely cysts. Of note, no prior cysts are seen on the remote CT of 2005. If there is any history of primary malignancy such that hepatic metastasis would be a concern, consider dedicated pre and post contrast abdominal MRI.  Past Medical History:  Diagnosis Date   BPH (benign prostatic hyperplasia)    CAD (coronary artery disease)    cardiolite ok 3/05, stress test- low risk study 11/06   Chronic low back pain    with radiculopathy   ED (erectile dysfunction)    Gallstones    abd [place]- stable hamangioma, gallbladder sludge and tiny stones 09/2003   GERD (gastroesophageal reflux disease)    Hyperlipidemia    Hypertension    Transfusion history    Past Surgical History:  Procedure Laterality Date   CATARACT EXTRACTION W/PHACO  03/13/2012   Procedure: CATARACT EXTRACTION PHACO AND INTRAOCULAR LENS PLACEMENT ([place]);  Surgeon: Adonis Brook, MD;  Location: [facility];  Service: Ophthalmology;  Laterality: Left;   CORONARY ARTERY BYPASS GRAFT  1998   x 1  [facility]   ESOPHAGOGASTRODUODENOSCOPY (EGD) WITH PROPOFOL N/A 06/07/2016   Procedure: ESOPHAGOGASTRODUODENOSCOPY (EGD) WITH PROPOFOL;  Surgeon: Mauri Pole, MD;  [facility];  Service: Endoscopy;  Laterality: N/A;   GASTRECTOMY     partial   LEFT HEART CATH AND CORS/GRAFTS ANGIOGRAPHY N/A 12/02/2020   Procedure: LEFT HEART CATH AND CORS/GRAFTS ANGIOGRAPHY;  Surgeon: Lorretta Harp, MD;  Location: [place] [facility];  Service: Cardiovascular;  Laterality: N/A;   MEMBRANE PEEL  03/13/2012   Procedure: MEMBRANE PEEL;  Surgeon: Adonis Brook, MD;  Location: [place];  Service: Ophthalmology;  Laterality: Left;   PARS PLANA VITRECTOMY  03/13/2012   Procedure: PARS PLANA VITRECTOMY WITH 23 GAUGE;  Surgeon: Adonis Brook, MD;  Location: [place];  Service: Ophthalmology;  Laterality: Left;   Social History: He has 2 sons and 1  daughter.  He is retired.  Non-smoker.  No alcohol use.  No drug use.  Family History: Mother with history of hypertension.  71 brother had colon cancer, age of diagnosis unknown. Brother had thyroid cancer.   No Known Allergies    Outpatient Encounter Medications as of 05/10/2021  Medication Sig   aluminum hydroxide-magnesium carbonate (GAVISCON) 95-358 MG/15ML SUSP Take 15 mLs by mouth as needed for indigestion.   amLODipine (NORVASC) 5 MG tablet Take 0.5 tablets (2.5 mg total) by mouth 2 (two) times daily.   aspirin EC 81 MG tablet Take 1 tablet (81 mg total) by mouth daily.   atorvastatin (LIPITOR) 40 MG tablet Take 1 tablet (40 mg total) by mouth daily.   carvedilol (COREG) 3.125 MG tablet TAKE 1 TABLET BY MOUTH 2 TIMES DAILY WITH A MEAL.   doxazosin (CARDURA) 4 MG tablet Take 1 tablet (4 mg total) by mouth at bedtime.   hydrALAZINE (APRESOLINE) 25 MG tablet Take 1 tablet (25 mg total) by mouth 2 (two) times daily.   pantoprazole (PROTONIX) 40 MG tablet TAKE 1 TABLET BY MOUTH EVERY DAY   Phenylephrine-DM-GG (ROBAFEN CF MULTI-SYMPTOM COLD) 5-10-100 MG/5ML  LIQD Take 5 mLs by mouth daily as needed (cough).   Polyethyl Glycol-Propyl Glycol (SYSTANE OP) Place 1 drop into both eyes daily as needed (dry eyes).   tamsulosin (FLOMAX) 0.4 MG CAPS capsule TAKE 1 CAPSULES BY MOUTH ONCE DAILY.   valsartan (DIOVAN) 320 MG tablet TAKE 1 TABLET BY MOUTH EVERY DAY   No facility-administered encounter medications on file as of 05/10/2021.    REVIEW OF SYSTEMS:  Gen: Denies fever, sweats or chills. No weight loss.  CV: Denies chest pain, palpitations or edema. Resp: Denies cough, shortness of breath of hemoptysis.  GI: See HPI.   GU : Denies urinary burning, blood in urine, increased urinary frequency or incontinence. MS: Denies joint pain, muscles aches or weakness. Derm: Denies rash, itchiness, skin lesions or unhealing ulcers. Psych: Denies depression, anxiety, memory loss, suicidal ideation  and confusion. Heme: Denies bruising, bleeding. Neuro:  Denies headaches, dizziness or paresthesias. Endo:  Denies any problems with DM, thyroid or adrenal function.   PHYSICAL EXAM: BP (!) 150/60 (BP Location: Left Arm, Patient Position: Sitting, Cuff Size: Normal)    Pulse 72    Ht 5' 7.5" (1.715 m) Comment: height measured without shoes   Wt 185 lb 2 oz (84 kg)    BMI 28.57 kg/m   General: 76 year old male in no acute distress. Head: Normocephalic and atraumatic. Eyes:  Sclerae non-icteric, conjunctive pink. Ears: Normal auditory acuity. Mouth: Dentition intact. No ulcers or lesions.  Neck: Supple, no lymphadenopathy or thyromegaly.  Lungs: Clear bilaterally to auscultation without wheezes, crackles or rhonchi. Heart: Regular rate and rhythm. No murmur, rub or gallop appreciated.  Abdomen: Soft, nontender, non distended. No masses. No hepatosplenomegaly. Normoactive bowel sounds x 4 quadrants.  Rectal: No external hemorrhoids. Internal hemorrhoids without prolapse. No perianal rash.  Dark brown stool guaiac negative.  Melissa CMA present during exam. Musculoskeletal: Symmetrical with no gross deformities. Skin: Warm and dry. No rash or lesions on visible extremities. Extremities: No edema. Neurological: Alert oriented x 4, no focal deficits.  Psychological:  Alert and cooperative. Normal mood and affect.  ASSESSMENT AND PLAN:  32) 76 year old male with lower abdominal pain for the past month.  Intermittent constipation with straining. -CBC, CMP -CTAP with oral and IV contrast, BUN/creat level to be reviewed prior to the patient receiving IV contrast -MiraLAX nightly as needed  2) History of a 4 mm tubular adenomatous polyp removed from the cecum per colonoscopy 02/15/2016.  Half brother with history of colon cancer. -Patient is currently prescheduled for a colonoscopy with Dr. Fuller Plan on 05/31/2021. Await CTAP results to further verify if patient to proceed with his colonoscopy as  scheduled or to defer.  If his CTAP is unrevealing I am not sure the benefits outweigh the risks of a colon polyp surveillance colonoscopy in the setting of significant heart disease in a 76 year old male. -Await further recommendations per DrMarland Kitchen  3) History of PUD s/p partial gastrectomy 1980's, + H pylori  per EGD 06/01/2016, admitted to the hospital secondary to GI bleed and anemia requiring a repeat EGD 06/07/2016 which showed a gastric ulcer with adherent clot.  H. pylori was treated with Pylera and he was placed on PPI twice daily and subsequently transition to PPI daily.  No GERD symptoms or upper abdominal pain at this time. -Continue pantoprazole 40 mg daily  4) History of CAD s/p 1 vessel CABG 1994. Cardiac cath 12/02/2020 showed an occluded proximal LAD with a patent LIMA, left to left collaterals from circumflex to an  occluded diagonal branch, an occluded RCA with an occluded vein graft to the RCA.  He was instructed to continue Lipitor, Carvedilol, Valsartan and Amlodipine.  He denies having any further chest pain or dyspnea.      CC:  Tower, Wynelle Fanny, MD

## 2021-05-15 NOTE — Progress Notes (Signed)
His CTAP is scheduled on 05/20/2021, I will keep track of result. Will cancel his 05/2021 colonoscopy if colon normal on CT.

## 2021-05-20 ENCOUNTER — Ambulatory Visit (HOSPITAL_BASED_OUTPATIENT_CLINIC_OR_DEPARTMENT_OTHER)
Admission: RE | Admit: 2021-05-20 | Discharge: 2021-05-20 | Disposition: A | Discharge status: Home / Self Care | Payer: Medicare Other | Source: Ambulatory Visit | Attending: Nurse Practitioner | Admitting: Nurse Practitioner

## 2021-05-20 ENCOUNTER — Other Ambulatory Visit: Payer: Self-pay

## 2021-05-20 DIAGNOSIS — R109 Unspecified abdominal pain: Secondary | ICD-10-CM | POA: Diagnosis not present

## 2021-05-20 DIAGNOSIS — K648 Other hemorrhoids: Secondary | ICD-10-CM | POA: Insufficient documentation

## 2021-05-20 DIAGNOSIS — Z86010 Personal history of colonic polyps: Secondary | ICD-10-CM | POA: Diagnosis not present

## 2021-05-20 DIAGNOSIS — R1030 Lower abdominal pain, unspecified: Secondary | ICD-10-CM | POA: Diagnosis not present

## 2021-05-20 DIAGNOSIS — I700 Atherosclerosis of aorta: Secondary | ICD-10-CM | POA: Diagnosis not present

## 2021-05-20 MED ORDER — IOHEXOL 300 MG/ML  SOLN
100.0000 mL | Freq: Once | INTRAMUSCULAR | Status: AC | PRN
  Administered 2021-05-20: 100 mL via INTRAVENOUS

## 2021-05-25 ENCOUNTER — Other Ambulatory Visit: Payer: Self-pay

## 2021-05-25 DIAGNOSIS — I771 Stricture of artery: Secondary | ICD-10-CM

## 2021-05-25 DIAGNOSIS — R109 Unspecified abdominal pain: Secondary | ICD-10-CM

## 2021-05-31 ENCOUNTER — Encounter: Payer: Medicare Other | Admitting: Gastroenterology

## 2021-06-06 DIAGNOSIS — N281 Cyst of kidney, acquired: Secondary | ICD-10-CM | POA: Diagnosis not present

## 2021-06-14 ENCOUNTER — Telehealth: Payer: Self-pay

## 2021-06-14 NOTE — Telephone Encounter (Signed)
Patient to be scheduled follow up a appointment with Dr Fuller Plan. This is to take place after the appointment with the vascular surgeon which is 06/22/21. See the CT A/P on 05/20/21 for details.  Called the patient to assist with scheduling. No answer. No voicemail.

## 2021-06-16 ENCOUNTER — Other Ambulatory Visit: Payer: Self-pay

## 2021-06-16 NOTE — Telephone Encounter (Signed)
Letter and appointment card mailed to the patient.

## 2021-06-17 ENCOUNTER — Other Ambulatory Visit: Payer: Self-pay | Admitting: Physician Assistant

## 2021-06-22 ENCOUNTER — Ambulatory Visit: Payer: Medicare Other | Admitting: Vascular Surgery

## 2021-06-22 ENCOUNTER — Encounter: Payer: Self-pay | Admitting: Vascular Surgery

## 2021-06-22 ENCOUNTER — Other Ambulatory Visit: Payer: Self-pay

## 2021-06-22 VITALS — BP 158/75 | HR 70 | Temp 98.1°F | Resp 20 | Ht 67.5 in | Wt 180.0 lb

## 2021-06-22 DIAGNOSIS — I739 Peripheral vascular disease, unspecified: Secondary | ICD-10-CM

## 2021-06-22 NOTE — Progress Notes (Signed)
? ?Patient ID: Jerry Farrell, male   DOB: October 17, 1945, 76 y.o.   MRN: [id number] ? ?Reason for Consult: New Patient (Initial Visit) ?  ?Referred by Carl Best * ? ?Subjective:  ?   ?HPI: ? ?Jerry Farrell is a 76 y.o. male with significant history of coronary artery disease also has hyperlipidemia and hypertension.  He underwent CABG in 1994.  He is a lifelong non-smoker other than trying a couple cigarettes when he worked at [employer].  Recently complained of abdominal pain in his lower abdomen for 2 months.  This pain radiating down into his left groin but has since resolved.  He underwent CT scan which demonstrated stenoses of his bilateral internal iliac arteries.  He has never had any vascular invention.  He does take aspirin and a statin. ? ?Past Medical History:  ?Diagnosis Date  ? Adenomatous colon polyp   ? BPH (benign prostatic hyperplasia)   ? CAD (coronary artery disease)   ? cardiolite ok 3/05, stress test- low risk study 11/06  ? Chronic low back pain   ? with radiculopathy  ? ED (erectile dysfunction)   ? Gallstones   ? abd [place]- stable hamangioma, gallbladder sludge and tiny stones 09/2003  ? Gastritis   ? GERD (gastroesophageal reflux disease)   ? H pylori ulcer   ? Hyperlipidemia   ? Hypertension   ? Transfusion history   ? ?Family History  ?Problem Relation Age of Onset  ? Hypertension Mother   ? Throat cancer Brother   ?     pt feels alcohol related  ? Colon cancer Brother   ?     1/2 brother  ? Stomach cancer Neg Hx   ? Pancreatic cancer Neg Hx   ? ?Past Surgical History:  ?Procedure Laterality Date  ? CATARACT EXTRACTION W/PHACO  03/13/2012  ? Procedure: CATARACT EXTRACTION PHACO AND INTRAOCULAR LENS PLACEMENT ([facility]);  Surgeon: Adonis Brook, MD;  Location: [place];  Service: Ophthalmology;  Laterality: Left;  ? CORONARY ARTERY BYPASS GRAFT  1998  ? x 1  [facility]  ? ESOPHAGOGASTRODUODENOSCOPY (EGD) WITH PROPOFOL N/A 06/07/2016  ? Procedure: ESOPHAGOGASTRODUODENOSCOPY (EGD) WITH  PROPOFOL;  Surgeon: Mauri Pole, MD;  [facility];  Service: Endoscopy;  Laterality: N/A;  ? GASTRECTOMY    ? partial  ? LEFT HEART CATH AND CORS/GRAFTS ANGIOGRAPHY N/A 12/02/2020  ? Procedure: LEFT HEART CATH AND CORS/GRAFTS ANGIOGRAPHY;  Surgeon: Lorretta Harp, MD;  Location: [place] [facility];  Service: Cardiovascular;  Laterality: N/A;  ? MEMBRANE PEEL  03/13/2012  ? Procedure: MEMBRANE PEEL;  Surgeon: Adonis Brook, MD;  Location: [facility];  Service: Ophthalmology;  Laterality: Left;  ? PARS PLANA VITRECTOMY  03/13/2012  ? Procedure: PARS PLANA VITRECTOMY WITH 23 GAUGE;  Surgeon: Adonis Brook, MD;  Location: [place];  Service: Ophthalmology;  Laterality: Left;  ? ? ?Short Social History:  ?Social History  ? ?Tobacco Use  ? Smoking status: Never  ? Smokeless tobacco: Never  ?Substance Use Topics  ? Alcohol use: No  ?  Alcohol/week: 0.0 standard drinks  ? ? ?No Known Allergies ? ?Current Outpatient Medications  ?Medication Sig Dispense Refill  ? aluminum hydroxide-magnesium carbonate (GAVISCON) 95-358 MG/15ML SUSP Take 15 mLs by mouth as needed for indigestion.    ? amLODipine (NORVASC) 5 MG tablet Take 0.5 tablets (2.5 mg total) by mouth 2 (two) times daily. 90 tablet 3  ? aspirin EC 81 MG tablet Take 1 tablet (81  mg total) by mouth daily. 90 tablet 3  ? atorvastatin (LIPITOR) 40 MG tablet Take 1 tablet (40 mg total) by mouth daily. 90 tablet 3  ? carvedilol (COREG) 3.125 MG tablet TAKE 1 TABLET BY MOUTH 2 TIMES DAILY WITH A MEAL. 180 tablet 3  ? doxazosin (CARDURA) 4 MG tablet Take 1 tablet (4 mg total) by mouth at bedtime.    ? pantoprazole (PROTONIX) 40 MG tablet TAKE 1 TABLET BY MOUTH EVERY DAY 90 tablet 0  ? Phenylephrine-DM-GG (ROBAFEN CF MULTI-SYMPTOM COLD) 5-10-100 MG/5ML LIQD Take 5 mLs by mouth daily as needed (cough).    ? Polyethyl Glycol-Propyl Glycol (SYSTANE OP) Place 1 drop into both eyes daily as needed (dry eyes).    ? tamsulosin (FLOMAX) 0.4 MG CAPS capsule TAKE 1 CAPSULES BY  MOUTH ONCE DAILY. 90 capsule 1  ? valsartan (DIOVAN) 320 MG tablet TAKE 1 TABLET BY MOUTH EVERY DAY 90 tablet 3  ? hydrALAZINE (APRESOLINE) 25 MG tablet Take 1 tablet (25 mg total) by mouth 2 (two) times daily. 180 tablet 3  ? Na Sulfate-K Sulfate-Mg Sulf (SUPREP BOWEL PREP KIT) 17.5-3.13-1.6 GM/177ML SOLN Take 1 kit by mouth as directed. For colonoscopy prep (Patient not taking: Reported on 06/22/2021) 354 mL 0  ? ?No current facility-administered medications for this visit.  ? ? ?Review of Systems  ?Constitutional:  Constitutional negative. ?HENT: HENT negative.  ?Eyes: Eyes negative.  ?Respiratory: Respiratory negative.  ?Cardiovascular: Positive for leg swelling.  ?GI: Positive for abdominal pain.  ?GU:  ?     Erectile dysfunction ?Musculoskeletal: Musculoskeletal negative.  ?Skin: Skin negative.  ?Neurological: Neurological negative. ?Hematologic: Hematologic/lymphatic negative.  ?Psychiatric: Psychiatric negative.   ? ?   ?Objective:  ?Objective  ?Vitals:  ? 06/22/21 1411  ?BP: (!) 158/75  ?Pulse: 70  ?Resp: 20  ?Temp: 98.1 ?F (36.7 ?C)  ?SpO2: 96%  ? ? ? ?Physical Exam ?HENT:  ?   Head: Normocephalic.  ?   Nose:  ?   Comments: Wearing a mask ?Eyes:  ?   Pupils: Pupils are equal, round, and reactive to light.  ?Cardiovascular:  ?   Rate and Rhythm: Normal rate.  ?   Pulses: Normal pulses.  ?Pulmonary:  ?   Effort: Pulmonary effort is normal.  ?Abdominal:  ?   General: Abdomen is flat.  ?   Palpations: Abdomen is soft. There is no mass.  ?Musculoskeletal:     ?   General: Normal range of motion.  ?   Cervical back: Normal range of motion and neck supple.  ?   Right lower leg: Edema present.  ?   Left lower leg: Edema present.  ?Skin: ?   General: Skin is warm.  ?   Capillary Refill: Capillary refill takes less than 2 seconds.  ?Neurological:  ?   General: No focal deficit present.  ?   Mental Status: He is alert.  ?Psychiatric:     ?   Mood and Affect: Mood normal.  ? ? ?Data: ?CT abdomen/pelvis IMPRESSION: ?1.  No acute abdominal/pelvic findings, mass lesions or adenopathy. ?2. Cholelithiasis without CT findings for acute cholecystitis. ?3. Moderate atherosclerotic calcifications involving the aorta and ?branch vessels but no aneurysm or dissection. Suspect high-grade ?stenosis of both internal iliac arteries. ?4. Enlarged prostate gland with median lobe hypertrophy impressing ?on the base of the bladder. ?  ?    ?Assessment/Plan:  ?  ?76 year old male with recent diagnosis of abdominal pain underwent CT scan which demonstrated high-grade stenosis  bilateral internal iliac arteries.  We reviewed his CT scan together today and he states that his abdominal pain has resolved.  He does have erectile dysfunction which may be caused by his internal iliac stenoses/occlusions but otherwise he is asymptomatic.  He will continue aspirin and statin.  He can see me on an as-needed basis. ? ?  ? ?Waynetta Sandy MD ?[facility] ? ? ?

## 2021-06-23 NOTE — Progress Notes (Signed)
Subjective:   Jerry Farrell is a 76 y.o. male who presents for Medicare Annual/Subsequent preventive examination.  I connected with Jerry Farrell today by telephone and verified that I am speaking with the correct person using two identifiers. Location patient: home Location provider: work Persons participating in the virtual visit: patient, Engineer, civil (consulting).    I discussed the limitations, risks, security and privacy concerns of performing an evaluation and management service by telephone and the availability of in person appointments. I also discussed with the patient that there may be a patient responsible charge related to this service. The patient expressed understanding and verbally consented to this telephonic visit.    Interactive audio and video telecommunications were attempted between this provider and patient, however failed, due to patient having technical difficulties OR patient did not have access to video capability.  We continued and completed visit with audio only.  Some vital signs may be absent or patient reported.   Time Spent with patient on telephone encounter: 20 minutes  Review of Systems     Cardiac Risk Factors include: advanced age (>72men, >33 women);hypertension;dyslipidemia     Objective:    Today's Vitals   06/24/21 1357  Weight: 180 lb (81.6 kg)  Height: 5\' 7"  (1.702 m)   Body mass index is 28.19 kg/m.  Advanced Directives 06/24/2021 12/02/2020 12/02/2020 06/23/2020 05/14/2019 05/08/2018 06/06/2016  Does Patient Have a Medical Advance Directive? No No No Yes No No No  Type of Advance Directive - - 06/08/2016;Living will - - -  Copy of Healthcare Power of Attorney in Chart? - - - No - copy requested - - -  Would patient like information on creating a medical advance directive? Yes (MAU/Ambulatory/Procedural Areas - Information given) No - Patient declined No - Patient declined - No - Patient declined No - Patient declined No - Patient declined     Current Medications (verified) Outpatient Encounter Medications as of 06/24/2021  Medication Sig   aluminum hydroxide-magnesium carbonate (GAVISCON) 95-358 MG/15ML SUSP Take 15 mLs by mouth as needed for indigestion.   amLODipine (NORVASC) 5 MG tablet Take 0.5 tablets (2.5 mg total) by mouth 2 (two) times daily.   aspirin EC 81 MG tablet Take 1 tablet (81 mg total) by mouth daily.   atorvastatin (LIPITOR) 40 MG tablet Take 1 tablet (40 mg total) by mouth daily.   carvedilol (COREG) 3.125 MG tablet TAKE 1 TABLET BY MOUTH 2 TIMES DAILY WITH A MEAL.   doxazosin (CARDURA) 4 MG tablet Take 1 tablet (4 mg total) by mouth at bedtime.   Na Sulfate-K Sulfate-Mg Sulf (SUPREP BOWEL PREP KIT) 17.5-3.13-1.6 GM/177ML SOLN Take 1 kit by mouth as directed. For colonoscopy prep   pantoprazole (PROTONIX) 40 MG tablet TAKE 1 TABLET BY MOUTH EVERY DAY   Phenylephrine-DM-GG (ROBAFEN CF MULTI-SYMPTOM COLD) 5-10-100 MG/5ML LIQD Take 5 mLs by mouth daily as needed (cough).   Polyethyl Glycol-Propyl Glycol (SYSTANE OP) Place 1 drop into both eyes daily as needed (dry eyes).   tamsulosin (FLOMAX) 0.4 MG CAPS capsule TAKE 1 CAPSULES BY MOUTH ONCE DAILY.   valsartan (DIOVAN) 320 MG tablet TAKE 1 TABLET BY MOUTH EVERY DAY   hydrALAZINE (APRESOLINE) 25 MG tablet Take 1 tablet (25 mg total) by mouth 2 (two) times daily.   No facility-administered encounter medications on file as of 06/24/2021.    Allergies (verified) Patient has no known allergies.   History: Past Medical History:  Diagnosis Date   Adenomatous colon polyp  BPH (benign prostatic hyperplasia)    CAD (coronary artery disease)    cardiolite ok 3/05, stress test- low risk study 11/06   Chronic low back pain    with radiculopathy   ED (erectile dysfunction)    Gallstones    abd [place]- stable hamangioma, gallbladder sludge and tiny stones 09/2003   Gastritis    GERD (gastroesophageal reflux disease)    H pylori ulcer    Hyperlipidemia     Hypertension    Transfusion history    Past Surgical History:  Procedure Laterality Date   CATARACT EXTRACTION W/PHACO  03/13/2012   Procedure: CATARACT EXTRACTION PHACO AND INTRAOCULAR LENS PLACEMENT ([place]);  Surgeon: Adonis Brook, MD;  Location: [place];  Service: Ophthalmology;  Laterality: Left;   CORONARY ARTERY BYPASS GRAFT  1998   x 1  [facility]   ESOPHAGOGASTRODUODENOSCOPY (EGD) WITH PROPOFOL N/A 06/07/2016   Procedure: ESOPHAGOGASTRODUODENOSCOPY (EGD) WITH PROPOFOL;  Surgeon: Mauri Pole, MD;  Location: [facility];  Service: Endoscopy;  Laterality: N/A;   GASTRECTOMY     partial   LEFT HEART CATH AND CORS/GRAFTS ANGIOGRAPHY N/A 12/02/2020   Procedure: LEFT HEART CATH AND CORS/GRAFTS ANGIOGRAPHY;  Surgeon: Lorretta Harp, MD;  Location: [place] [facility];  Service: Cardiovascular;  Laterality: N/A;   MEMBRANE PEEL  03/13/2012   Procedure: MEMBRANE PEEL;  Surgeon: Adonis Brook, MD;  Location: [place];  Service: Ophthalmology;  Laterality: Left;   PARS PLANA VITRECTOMY  03/13/2012   Procedure: PARS PLANA VITRECTOMY WITH 23 GAUGE;  Surgeon: Adonis Brook, MD;  Location: [place];  Service: Ophthalmology;  Laterality: Left;   Family History  Problem Relation Age of Onset   Hypertension Mother    Throat cancer Brother        pt feels alcohol related   Colon cancer Brother        1/2 brother   Stomach cancer Neg Hx    Pancreatic cancer Neg Hx    Social History   Socioeconomic History   Marital status: Married    Spouse name: Not on file   Number of children: 3   Years of education: Not on file   Highest education level: Not on file  Occupational History   Occupation: [employer]    Employer: RETIRED  Tobacco Use   Smoking status: Never   Smokeless tobacco: Never  Vaping Use   Vaping Use: Never used  Substance and Sexual Activity   Alcohol use: No    Alcohol/week: 0.0 standard drinks   Drug use: No   Sexual activity: Yes  Other Topics Concern   Not on file   Social History Narrative   Not on file   Social Determinants of Health   Financial Resource Strain: Low Risk    Difficulty of Paying Living Expenses: Not hard at all  Food Insecurity: No Food Insecurity   Worried About Charity fundraiser in the Last Year: Never true   [place] in the Last Year: Never true  Transportation Needs: No Transportation Needs   Lack of Transportation (Medical): No   Lack of Transportation (Non-Medical): No  Physical Activity: Unknown   Days of Exercise per Week: 3 days   Minutes of Exercise per Session: Not on file  Stress: No Stress Concern Present   Feeling of Stress : Not at all  Social Connections: Socially Integrated   Frequency of Communication with Friends and Family: More than three times a week   Frequency of  Social Gatherings with Friends and Family: More than three times a week   Attends Religious Services: More than 4 times per year   Active Member of [employer] or Organizations: Yes   Attends Music therapist: More than 4 times per year   Marital Status: Married    Tobacco Counseling Counseling given: Not Answered   Clinical Intake:  Pre-visit preparation completed: Yes  Pain : No/denies pain     BMI - recorded: 28.19 Nutritional Status: BMI 25 -29 Overweight Nutritional Risks: None Diabetes: No  How often do you need to have someone help you when you read instructions, pamphlets, or other written materials from your doctor or pharmacy?: 1 - Never  Diabetic? No  Interpreter Needed?: No  Information entered by :: Orrin Brigham LPN   Activities of Daily Living In your present state of health, do you have any difficulty performing the following activities: 06/24/2021 12/02/2020  Hearing? Tempie Donning  Comment wears hearing aids -  Vision? N Y  Comment - left eye  Difficulty concentrating or making decisions? N N  Walking or climbing stairs? N N  Dressing or bathing? N N  Doing errands, shopping? N N  Preparing  Food and eating ? N -  Using the Toilet? N -  In the past six months, have you accidently leaked urine? N -  Do you have problems with loss of bowel control? N -  Managing your Medications? N -  Managing your Finances? N -  Housekeeping or managing your Housekeeping? N -  Some recent data might be hidden    Patient Care Team: Tower, Wynelle Fanny, MD as PCP - General Gwenlyn Found Pearletha Forge, MD as PCP - Cardiology (Cardiology) Lorretta Harp, MD as Consulting Physician (Cardiology) Camillo Flaming, OD as Consulting Physician (Optometry)  Indicate any recent Medical Services you may have received from other than [facility] providers in the past year (date may be approximate).     Assessment:   This is a routine wellness examination for Jerry Farrell.  Hearing/Vision screen Hearing Screening - Comments:: Wears hearing aids Vision Screening - Comments:: Last exam 2022, Dr. Simonne Come  Dietary issues and exercise activities discussed: Current Exercise Habits: Home exercise routine, Type of exercise: Other - see comments;walking (bowling), Frequency (Times/Week): 3, Intensity: Mild   Goals Addressed             This Visit's Progress    Patient Stated       Would like to maintain current routine        Depression Screen PHQ 2/9 Scores 06/24/2021 06/23/2020 05/14/2019 05/08/2018 03/26/2017 01/26/2016 10/28/2014  PHQ - 2 Score 0 0 0 0 0 0 0  PHQ- 9 Score - 0 0 0 - - -    Fall Risk Fall Risk  06/24/2021 06/23/2020 05/14/2019 05/08/2018 03/26/2017  Falls in the past year? 0 0 0 0 No  Number falls in past yr: 0 0 0 - -  Injury with Fall? 0 0 0 - -  Risk for fall due to : No Fall Risks Medication side effect Medication side effect - -  Follow up Falls prevention discussed Falls evaluation completed;Falls prevention discussed Falls evaluation completed;Falls prevention discussed - -    FALL RISK PREVENTION PERTAINING TO THE HOME:  Any stairs in or around the home? Yes  If so, are there any without handrails? No  Home  free of loose throw rugs in walkways, pet beds, electrical cords, etc? Yes  Adequate lighting in your home  to reduce risk of falls? Yes   ASSISTIVE DEVICES UTILIZED TO PREVENT FALLS:  Life alert? No  Use of a cane, walker or w/c? No  Grab bars in the bathroom? Yes  Shower chair or bench in shower? No  Elevated toilet seat or a handicapped toilet? Yes   TIMED UP AND GO:  Was the test performed? No .   Cognitive Function: Normal cognitive status assessed by this Nurse Health Advisor. No abnormalities found.   MMSE - Mini Mental State Exam 06/23/2020 05/14/2019 05/08/2018 01/26/2016  Orientation to time $Remov'5 5 5 5  'aDhHCZ$ Orientation to Place $Remove'5 5 5 5  'pjCvqNS$ Registration $Remov'3 3 3 3  'NwjzxO$ Attention/ Calculation 5 5 0 0  Recall $Remov'3 2 3 3  'IONDpg$ Language- name 2 objects - - 0 0  Language- repeat $RemoveBeforeDE'1 1 1 1  'SqhugqdrJvayCKO$ Language- follow 3 step command - - 3 3  Language- read & follow direction - - 0 0  Write a sentence - - 0 0  Copy design - - 0 0  Total score - - 20 20        Immunizations Immunization History  Administered Date(s) Administered   Fluad Quad(high Dose 65+) 01/01/2019, 01/19/2020   Influenza Split 03/17/2011, 04/30/2012   Influenza, High Dose Seasonal PF 02/20/2018   Influenza,inj,Quad PF,6+ Mos 04/08/2013, 05/28/2015, 01/26/2016, 03/26/2017   Influenza-Unspecified 02/22/2014   PFIZER(Purple Top)SARS-COV-2 Vaccination 05/15/2019, 06/05/2019, 01/28/2020   Pfizer Covid-19 Vaccine Bivalent Booster 32yrs & up 02/08/2021   Pneumococcal Conjugate-13 10/28/2014   Pneumococcal Polysaccharide-23 04/30/2012   Td 08/22/1997, 11/26/2007   Zoster Recombinat (Shingrix) 10/12/2020   Zoster, Live 08/19/2013    TDAP status: Due, Education has been provided regarding the importance of this vaccine. Advised may receive this vaccine at local pharmacy or Health Dept. Aware to provide a copy of the vaccination record if obtained from local pharmacy or Health Dept. Verbalized acceptance and understanding.  Flu Vaccine status: Up  to date  Pneumococcal vaccine status: Up to date  Covid-19 vaccine status: Completed vaccines  Qualifies for Shingles Vaccine? Yes   Zostavax completed Yes   Shingrix Completed?: Yes  Screening Tests Health Maintenance  Topic Date Due   INFLUENZA VACCINE  11/22/2020   Zoster Vaccines- Shingrix (2 of 2) 12/07/2020   COLONOSCOPY (Pts 45-48yrs Insurance coverage will need to be confirmed)  02/14/2021   TETANUS/TDAP  06/24/2023 (Originally 11/25/2017)   Pneumonia Vaccine 92+ Years old  Completed   COVID-19 Vaccine  Completed   Hepatitis C Screening  Completed   HPV VACCINES  Aged Out    Health Maintenance  Health Maintenance Due  Topic Date Due   INFLUENZA VACCINE  11/22/2020   Zoster Vaccines- Shingrix (2 of 2) 12/07/2020   COLONOSCOPY (Pts 45-70yrs Insurance coverage will need to be confirmed)  02/14/2021    Colorectal cancer screening: No longer required.   Lung Cancer Screening: (Low Dose CT Chest recommended if Age 60-80 years, 30 pack-year currently smoking OR have quit w/in 15years.) does not qualify.     Additional Screening:  Hepatitis C Screening: does qualify; Completed 01/26/16  Vision Screening: Recommended annual ophthalmology exams for early detection of glaucoma and other disorders of the eye. Is the patient up to date with their annual eye exam?  Yes  Who is the provider or what is the name of the office in which the patient attends annual eye exams? Dr. Lysle Morales   Dental Screening: Recommended annual dental exams for proper oral hygiene  Community Resource Referral /  Chronic Care Management: CRR required this visit?  No   CCM required this visit?  No      Plan:     I have personally reviewed and noted the following in the patients chart:   Medical and social history Use of alcohol, tobacco or illicit drugs  Current medications and supplements including opioid prescriptions. Patient is not currently taking opioid prescriptions. Functional ability  and status Nutritional status Physical activity Advanced directives List of other physicians Hospitalizations, surgeries, and ER visits in previous 12 months Vitals Screenings to include cognitive, depression, and falls Referrals and appointments  In addition, I have reviewed and discussed with patient certain preventive protocols, quality metrics, and best practice recommendations. A written personalized care plan for preventive services as well as general preventive health recommendations were provided to patient.   Due to this being a telephonic visit, the after visit summary with patients personalized plan was offered to patient via mail or my-chart.  Patient would like to access on my-chart.     Loma Messing, LPN   04/30/5299   Nurse Health Advisor  Nurse Notes:Patient plans on providing updated flu vaccine and 2nd dose of Shingix information.

## 2021-06-24 ENCOUNTER — Ambulatory Visit (INDEPENDENT_AMBULATORY_CARE_PROVIDER_SITE_OTHER): Payer: Medicare Other

## 2021-06-24 VITALS — Ht 67.0 in | Wt 180.0 lb

## 2021-06-24 DIAGNOSIS — Z0000 Encounter for general adult medical examination without abnormal findings: Secondary | ICD-10-CM

## 2021-06-24 NOTE — Patient Instructions (Signed)
Jerry Farrell , Thank you for taking time to complete your Medicare Wellness Visit. I appreciate your ongoing commitment to your health goals. Please review the following plan we discussed and let me know if I can assist you in the future.   Screening recommendations/referrals: Colonoscopy: no longer required  Recommended yearly ophthalmology/optometry visit for glaucoma screening and checkup Recommended yearly dental visit for hygiene and checkup  Vaccinations: Influenza vaccine: up to date, please provide updated vaccine information when available  Pneumococcal vaccine: up to date Tdap vaccine: due 11/25/17, medicare may cover in the event you are cut or injured  Shingles vaccine: up to date, please provide updated vaccine information when available    Covid-19: up to date   Advanced directives: information available at your next appointment  Conditions/risks identified: see problem list   Next appointment: Follow up in one year for your annual wellness visit. 06/27/22 @ 9:15am, this will be a telephone visit  Preventive Care 90 Years and Older, Male Preventive care refers to lifestyle choices and visits with your health care provider that can promote health and wellness. What does preventive care include? A yearly physical exam. This is also called an annual well check. Dental exams once or twice a year. Routine eye exams. Ask your health care provider how often you should have your eyes checked. Personal lifestyle choices, including: Daily care of your teeth and gums. Regular physical activity. Eating a healthy diet. Avoiding tobacco and drug use. Limiting alcohol use. Practicing safe sex. Taking low doses of aspirin every day. Taking vitamin and mineral supplements as recommended by your health care provider. What happens during an annual well check? The services and screenings done by your health care provider during your annual well check will depend on your age, overall health,  lifestyle risk factors, and family history of disease. Counseling  Your health care provider may ask you questions about your: Alcohol use. Tobacco use. Drug use. Emotional well-being. Home and relationship well-being. Sexual activity. Eating habits. History of falls. Memory and ability to understand (cognition). Work and work Statistician. Screening  You may have the following tests or measurements: Height, weight, and BMI. Blood pressure. Lipid and cholesterol levels. These may be checked every 5 years, or more frequently if you are over 17 years old. Skin check. Lung cancer screening. You may have this screening every year starting at age 60 if you have a 30-pack-year history of smoking and currently smoke or have quit within the past 15 years. Fecal occult blood test (FOBT) of the stool. You may have this test every year starting at age 47. Flexible sigmoidoscopy or colonoscopy. You may have a sigmoidoscopy every 5 years or a colonoscopy every 10 years starting at age 30. Prostate cancer screening. Recommendations will vary depending on your family history and other risks. Hepatitis C blood test. Hepatitis B blood test. Sexually transmitted disease (STD) testing. Diabetes screening. This is done by checking your blood sugar (glucose) after you have not eaten for a while (fasting). You may have this done every 1-3 years. Abdominal aortic aneurysm (AAA) screening. You may need this if you are a current or former smoker. Osteoporosis. You may be screened starting at age 70 if you are at high risk. Talk with your health care provider about your test results, treatment options, and if necessary, the need for more tests. Vaccines  Your health care provider may recommend certain vaccines, such as: Influenza vaccine. This is recommended every year. Tetanus, diphtheria, and acellular pertussis (  Tdap, Td) vaccine. You may need a Td booster every 10 years. Zoster vaccine. You may need this  after age 34. Pneumococcal 13-valent conjugate (PCV13) vaccine. One dose is recommended after age 37. Pneumococcal polysaccharide (PPSV23) vaccine. One dose is recommended after age 9. Talk to your health care provider about which screenings and vaccines you need and how often you need them. This information is not intended to replace advice given to you by your health care provider. Make sure you discuss any questions you have with your health care provider. Document Released: 05/07/2015 Document Revised: 12/29/2015 Document Reviewed: 02/09/2015 Elsevier Interactive Patient Education  2017 [employer] Prevention in the Home Falls can cause injuries. They can happen to people of all ages. There are many things you can do to make your home safe and to help prevent falls. What can I do on the outside of my home? Regularly fix the edges of walkways and driveways and fix any cracks. Remove anything that might make you trip as you walk through a door, such as a raised step or threshold. Trim any bushes or trees on the path to your home. Use bright outdoor lighting. Clear any walking paths of anything that might make someone trip, such as rocks or tools. Regularly check to see if handrails are loose or broken. Make sure that both sides of any steps have handrails. Any raised decks and porches should have guardrails on the edges. Have any leaves, snow, or ice cleared regularly. Use sand or salt on walking paths during winter. Clean up any spills in your garage right away. This includes oil or grease spills. What can I do in the bathroom? Use night lights. Install grab bars by the toilet and in the tub and shower. Do not use towel bars as grab bars. Use non-skid mats or decals in the tub or shower. If you need to sit down in the shower, use a plastic, non-slip stool. Keep the floor dry. Clean up any water that spills on the floor as soon as it happens. Remove soap buildup in the tub or  shower regularly. Attach bath mats securely with double-sided non-slip rug tape. Do not have throw rugs and other things on the floor that can make you trip. What can I do in the bedroom? Use night lights. Make sure that you have a light by your bed that is easy to reach. Do not use any sheets or blankets that are too big for your bed. They should not hang down onto the floor. Have a firm chair that has side arms. You can use this for support while you get dressed. Do not have throw rugs and other things on the floor that can make you trip. What can I do in the kitchen? Clean up any spills right away. Avoid walking on wet floors. Keep items that you use a lot in easy-to-reach places. If you need to reach something above you, use a strong step stool that has a grab bar. Keep electrical cords out of the way. Do not use floor polish or wax that makes floors slippery. If you must use wax, use non-skid floor wax. Do not have throw rugs and other things on the floor that can make you trip. What can I do with my stairs? Do not leave any items on the stairs. Make sure that there are handrails on both sides of the stairs and use them. Fix handrails that are broken or loose. Make sure that handrails are  as long as the stairways. Check any carpeting to make sure that it is firmly attached to the stairs. Fix any carpet that is loose or worn. Avoid having throw rugs at the top or bottom of the stairs. If you do have throw rugs, attach them to the floor with carpet tape. Make sure that you have a light switch at the top of the stairs and the bottom of the stairs. If you do not have them, ask someone to add them for you. What else can I do to help prevent falls? Wear shoes that: Do not have high heels. Have rubber bottoms. Are comfortable and fit you well. Are closed at the toe. Do not wear sandals. If you use a stepladder: Make sure that it is fully opened. Do not climb a closed stepladder. Make  sure that both sides of the stepladder are locked into place. Ask someone to hold it for you, if possible. Clearly mark and make sure that you can see: Any grab bars or handrails. First and last steps. Where the edge of each step is. Use tools that help you move around (mobility aids) if they are needed. These include: Canes. Walkers. Scooters. Crutches. Turn on the lights when you go into a dark area. Replace any light bulbs as soon as they burn out. Set up your furniture so you have a clear path. Avoid moving your furniture around. If any of your floors are uneven, fix them. If there are any pets around you, be aware of where they are. Review your medicines with your doctor. Some medicines can make you feel dizzy. This can increase your chance of falling. Ask your doctor what other things that you can do to help prevent falls. This information is not intended to replace advice given to you by your health care provider. Make sure you discuss any questions you have with your health care provider. Document Released: 02/04/2009 Document Revised: 09/16/2015 Document Reviewed: 05/15/2014 Elsevier Interactive Patient Education  2017 [employer].

## 2021-07-12 ENCOUNTER — Ambulatory Visit: Payer: Medicare Other | Admitting: Gastroenterology

## 2021-07-12 ENCOUNTER — Encounter: Payer: Self-pay | Admitting: Gastroenterology

## 2021-07-12 VITALS — BP 140/82 | HR 64 | Ht 67.0 in | Wt 186.0 lb

## 2021-07-12 DIAGNOSIS — R1030 Lower abdominal pain, unspecified: Secondary | ICD-10-CM | POA: Diagnosis not present

## 2021-07-12 DIAGNOSIS — K5900 Constipation, unspecified: Secondary | ICD-10-CM | POA: Diagnosis not present

## 2021-07-12 NOTE — Progress Notes (Signed)
? ? ?  History of Present Illness: This is a 76 year old male returning for follow-up of lower abdominal pain and constipation.  CTAP as below.  Since treating constipation with MiraLAX his constipation has improved and his lower abdominal pain has completely resolved. ? ?CT AP 05/21/2021 ?1. No acute abdominal/pelvic findings, mass lesions or adenopathy. ?2. Cholelithiasis without CT findings for acute cholecystitis. ?3. Moderate atherosclerotic calcifications involving the aorta and branch vessels but no aneurysm or dissection. Suspect high-grade stenosis of both internal iliac arteries. ?4. Enlarged prostate gland with median lobe hypertrophy impressing on the base of the bladder. ?  ? ?Current Medications, Allergies, Past Medical History, Past Surgical History, Family History and Social History were reviewed in [employer] record. ? ? ?Physical Exam: ?General: Well developed, well nourished, no acute distress ?Head: Normocephalic and atraumatic ?Eyes: Sclerae anicteric, EOMI ?Ears: Normal auditory acuity ?Mouth: Not examined, mask on during Covid-19 pandemic ?Lungs: Clear throughout to auscultation ?Heart: Regular rate and rhythm; no murmurs, rubs or bruits ?Abdomen: Soft, non tender and non distended. No masses, hepatosplenomegaly or hernias noted. Normal Bowel sounds ?Rectal: Not done ?Musculoskeletal: Symmetrical with no gross deformities  ?Pulses:  Normal pulses noted ?Extremities: No clubbing, cyanosis, edema or deformities noted ?Neurological: Alert oriented x 4, grossly nonfocal ?Psychological:  Alert and cooperative. Normal mood and affect ? ? ?Assessment and Recommendations: ? ?Lower abdominal pain, resolved, likely due to constipation. Follow up with PCP. GI follow up prn.  ?Constipation, mild, intermittent. Miralax qd prn. Follow up with PCP. GI follow up prn.  ?Cholelithiasis, asymptomatic.  ?S/P partial gastrectomy for ulcer disease, 1980s.  ?Personal history of adenomatous  colon polyps.  90 brother with colon cancer.  Given his age and comorbidities future surveillance colonoscopies are not planned. ?

## 2021-07-12 NOTE — Patient Instructions (Signed)
Continue Miralax daily as needed. ? ?Follow up as needed.  ? ?The [facility] providers would like to encourage you to use [facility] to communicate with providers for non-urgent requests or questions.  Due to long hold times on the telephone, sending your provider a message by [facility] may be a faster and more efficient way to get a response.  Please allow 48 business hours for a response.  Please remember that this is for non-urgent requests.  ? ?Thank you for choosing me and [facility]. ? ?Malcolm T. Dagoberto Ligas., MD., [facility] ? ?

## 2021-07-25 ENCOUNTER — Other Ambulatory Visit: Payer: Self-pay | Admitting: Cardiovascular Disease

## 2021-08-29 DIAGNOSIS — H5202 Hypermetropia, left eye: Secondary | ICD-10-CM | POA: Diagnosis not present

## 2021-08-29 DIAGNOSIS — H26493 Other secondary cataract, bilateral: Secondary | ICD-10-CM | POA: Diagnosis not present

## 2021-08-29 DIAGNOSIS — H35352 Cystoid macular degeneration, left eye: Secondary | ICD-10-CM | POA: Diagnosis not present

## 2021-08-29 DIAGNOSIS — H11441 Conjunctival cysts, right eye: Secondary | ICD-10-CM | POA: Diagnosis not present

## 2021-08-29 DIAGNOSIS — H40051 Ocular hypertension, right eye: Secondary | ICD-10-CM | POA: Diagnosis not present

## 2021-09-05 ENCOUNTER — Other Ambulatory Visit: Payer: Self-pay | Admitting: Cardiovascular Disease

## 2021-09-06 DIAGNOSIS — H5789 Other specified disorders of eye and adnexa: Secondary | ICD-10-CM | POA: Diagnosis not present

## 2021-09-09 ENCOUNTER — Other Ambulatory Visit: Payer: Self-pay | Admitting: Physician Assistant

## 2021-09-12 ENCOUNTER — Emergency Department (HOSPITAL_COMMUNITY): Payer: Medicare Other

## 2021-09-12 ENCOUNTER — Encounter (HOSPITAL_COMMUNITY): Payer: Self-pay | Admitting: Emergency Medicine

## 2021-09-12 ENCOUNTER — Other Ambulatory Visit: Payer: Self-pay

## 2021-09-12 ENCOUNTER — Telehealth: Payer: Self-pay

## 2021-09-12 ENCOUNTER — Emergency Department (HOSPITAL_COMMUNITY)
Admission: EM | Admit: 2021-09-12 | Discharge: 2021-09-12 | Disposition: A | Discharge status: Home / Self Care | Payer: Medicare Other | Attending: Emergency Medicine | Admitting: Emergency Medicine

## 2021-09-12 DIAGNOSIS — Z951 Presence of aortocoronary bypass graft: Secondary | ICD-10-CM | POA: Insufficient documentation

## 2021-09-12 DIAGNOSIS — Z79899 Other long term (current) drug therapy: Secondary | ICD-10-CM | POA: Insufficient documentation

## 2021-09-12 DIAGNOSIS — R0789 Other chest pain: Secondary | ICD-10-CM | POA: Diagnosis not present

## 2021-09-12 DIAGNOSIS — R1011 Right upper quadrant pain: Secondary | ICD-10-CM | POA: Insufficient documentation

## 2021-09-12 DIAGNOSIS — K760 Fatty (change of) liver, not elsewhere classified: Secondary | ICD-10-CM | POA: Diagnosis not present

## 2021-09-12 DIAGNOSIS — I10 Essential (primary) hypertension: Secondary | ICD-10-CM | POA: Insufficient documentation

## 2021-09-12 DIAGNOSIS — Z7982 Long term (current) use of aspirin: Secondary | ICD-10-CM | POA: Insufficient documentation

## 2021-09-12 DIAGNOSIS — I2510 Atherosclerotic heart disease of native coronary artery without angina pectoris: Secondary | ICD-10-CM | POA: Diagnosis not present

## 2021-09-12 DIAGNOSIS — R1010 Upper abdominal pain, unspecified: Secondary | ICD-10-CM | POA: Diagnosis not present

## 2021-09-12 DIAGNOSIS — R079 Chest pain, unspecified: Secondary | ICD-10-CM | POA: Diagnosis not present

## 2021-09-12 LAB — BASIC METABOLIC PANEL
Anion gap: 6 (ref 5–15)
BUN: 13 mg/dL (ref 8–23)
CO2: 25 mmol/L (ref 22–32)
Calcium: 9 mg/dL (ref 8.9–10.3)
Chloride: 108 mmol/L (ref 98–111)
Creatinine, Ser: 0.95 mg/dL (ref 0.61–1.24)
GFR, Estimated: >60 mL/min (ref >60)
Glucose, Bld: 112 mg/dL — ABNORMAL HIGH (ref 70–99)
Potassium: 3.9 mmol/L (ref 3.5–5.1)
Sodium: 139 mmol/L (ref 135–145)

## 2021-09-12 LAB — CBC
HCT: 45.5 % (ref 39.0–52.0)
Hemoglobin: 15.3 g/dL (ref 13.0–17.0)
MCH: 29.3 pg (ref 26.0–34.0)
MCHC: 33.6 g/dL (ref 30.0–36.0)
MCV: 87.2 fL (ref 80.0–100.0)
Platelets: 160 10*3/uL (ref 150–400)
RBC: 5.22 MIL/uL (ref 4.22–5.81)
RDW: 12.8 % (ref 11.5–15.5)
WBC: 6.6 10*3/uL (ref 4.0–10.5)
nRBC: 0 % (ref 0.0–0.2)

## 2021-09-12 LAB — TROPONIN I (HIGH SENSITIVITY)
Troponin I (High Sensitivity): 4 ng/L (ref <18)
Troponin I (High Sensitivity): 5 ng/L (ref <18)

## 2021-09-12 LAB — LIPASE, BLOOD: Lipase: 25 U/L (ref 11–51)

## 2021-09-12 LAB — HEPATIC FUNCTION PANEL
ALT: 31 U/L (ref 0–44)
AST: 24 U/L (ref 15–41)
Albumin: 3.8 g/dL (ref 3.5–5.0)
Alkaline Phosphatase: 113 U/L (ref 38–126)
Bilirubin, Direct: 0.2 mg/dL (ref 0.0–0.2)
Indirect Bilirubin: 1.2 mg/dL — ABNORMAL HIGH (ref 0.3–0.9)
Total Bilirubin: 1.4 mg/dL — ABNORMAL HIGH (ref 0.3–1.2)
Total Protein: 6.7 g/dL (ref 6.5–8.1)

## 2021-09-12 MED ORDER — ALUM & MAG HYDROXIDE-SIMETH 200-200-20 MG/5ML PO SUSP
30.0000 mL | Freq: Once | ORAL | Status: AC
  Administered 2021-09-12: 30 mL via ORAL
  Filled 2021-09-12: qty 30

## 2021-09-12 MED ORDER — LIDOCAINE VISCOUS HCL 2 % MT SOLN
15.0000 mL | Freq: Once | OROMUCOSAL | Status: AC
Start: 2021-09-12 — End: 2021-09-12
  Administered 2021-09-12: 15 mL via ORAL
  Filled 2021-09-12: qty 15

## 2021-09-12 NOTE — ED Provider Triage Note (Signed)
Emergency Medicine Provider Triage Evaluation Note  Jerry Farrell , a 76 y.o. male  was evaluated in triage.  Pt complains of chest pain.  Patient states he has been having similar symptoms for "a while", follows up with cardiology.  Had a cath several months ago which showed a few blockages".  States his symptoms are worse after eating, and he thinks that the carrot salad he had yesterday seems to have flared up his symptoms.  Review of Systems  Positive: Chest pain Negative: Shortness of breath, fatigue, weakness, syncope, diaphoresis  Physical Exam  BP (!) 142/77 (BP Location: Right Arm)   Pulse 82   Temp 97.9 F (36.6 C)   Resp 16   SpO2 99%  Gen:   Awake, no distress   Resp:  Normal effort  MSK:   Moves extremities without difficulty  Other:    Medical Decision Making  Medically screening exam initiated at 10:57 AM.  Appropriate orders placed.  [name] was informed that the remainder of the evaluation will be completed by another provider, this initial triage assessment does not replace that evaluation, and the importance of remaining in the ED until their evaluation is complete.     Wisdom Seybold T, PA-C 09/12/21 1059

## 2021-09-12 NOTE — Telephone Encounter (Signed)
Aware, will watch for correspondence  

## 2021-09-12 NOTE — Telephone Encounter (Signed)
Pt is currently in ER for chest pain

## 2021-09-12 NOTE — ED Triage Notes (Signed)
Patient coming from home, complaint of episodes of chest discomfort over the last few days. States it seems to come on after eating.

## 2021-09-12 NOTE — Telephone Encounter (Signed)
[facility] Day - Client TELEPHONE ADVICE RECORD [facility] Patient Name: Jerry Farrell Gender: Male DOB: 02/14/1946 Age: 76 Y 2 M 7 D Return Phone Number: [phone] (Primary), [phone] (Secondary) Address: City/ State/ Zip: [place] [address] Client [facility] - Client Client Site [place] Provider Glori Bickers, Roque Lias - MD Contact Type Call Who Is Calling Patient / Member / Family / Caregiver Call Type Triage / Clinical Relationship To Patient Self Return Phone Number [phone] (Primary) Chief Complaint BREATHING - shortness of breath or sounds breathless Reason for Call Symptomatic / Request for [name] Translation No Nurse Assessment Nurse: Thad Ranger, RN, Denise Date/Time (Eastern Time): 09/12/2021 9:40:22 AM Confirm and document reason for call. If symptomatic, describe symptoms. ---Caller states he is having chest pain and difficulty breathing. States it feels like gas. Does the patient have any new or worsening symptoms? ---Yes Will a triage be completed? ---Yes Related visit to physician within the last 2 weeks? ---No Does the PT have any chronic conditions? (i.e. diabetes, asthma, this includes High risk factors for pregnancy, etc.) ---Yes List chronic conditions. ---CAD Is this a behavioral health or substance abuse call? ---No Guidelines Guideline Title Affirmed Question Affirmed Notes Nurse Date/Time (Eastern Time) Chest Pain [1] Chest pain lasts > 5 minutes AND [2] age > 55 Carmon, RN, Langley Gauss 09/12/2021 9:41:37 AM Disp. Time [name] Time) Disposition Final User 09/12/2021 9:36:01 AM Send to Urgent [name] 09/12/2021 9:46:31 AM 911 Outcome Documentation Carmon, RN, Langley Gauss Reason: Pt refused to call 911 and will have spouse take him to the ER now. PLEASE  NOTE: All timestamps contained within this report are represented as [place] Standard Time. CONFIDENTIALTY NOTICE: This fax transmission is intended only for the addressee. It contains information that is legally privileged, confidential or otherwise protected from use or disclosure. If you are not the intended recipient, you are strictly prohibited from reviewing, disclosing, copying using or disseminating any of this information or taking any action in reliance on or regarding this information. If you have received this fax in error, please notify us immediately by telephone so that we can arrange for its return to [place]. Phone: [phone], Toll-Free: [phone], Fax: [phone] Page: 2 of 2 Call Id: [id number] [place]. Time (Eastern Time) Disposition Final User Advised to take all meds he takes with him to ER 09/12/2021 9:45:42 AM Call EMS 911 Now Yes Carmon, RN, Yevette Edwards Disagree/Comply Disagree Caller Understands Yes PreDisposition Call Doctor Care Advice Given Per Guideline CALL EMS 911 NOW: CARE ADVICE given per Chest Pain (Adult) guideline. Referrals [place]

## 2021-09-12 NOTE — Telephone Encounter (Signed)
Per chart review pt is presently at [facility] ED. Sending note to Dr Glori Bickers and [place] CMA.

## 2021-09-12 NOTE — ED Provider Notes (Signed)
[facility] EMERGENCY DEPARTMENT Provider Note   CSN: [id number] Arrival date & time: 09/12/21  1047     History  Chief Complaint  Patient presents with   Chest Pain    Jerry Farrell is a 76 y.o. male.  Patient is a 76 year old male with a history of CAD status post CABG of the left main and RCA, PAD, cholelithiasis, hypertension, hyperlipidemia who is presenting today with discomfort in the center of his chest that goes up into his throat and goes around to the right side.  He reports this started yesterday after eating some carrots salad.  He reports always having a lot of burping with eating but the discomfort did not seem to go away yesterday.  He did try to take a few some Tums and felt that that did help but when he woke up this morning the discomfort was still present.  He reports it feels about the same but denies any nausea or vomiting.  He did eat breakfast this morning but did not seem to improve his symptoms.  He has not had significant shortness of breath, lower abdominal pain, lightheadedness or syncope.  He denies any new leg swelling or change in medications.  He does still take pantoprazole daily.  He has not had cough, fever or other acute symptoms.  He has followed up with GI due to some lower abdominal pain and had a CT done several months ago which showed peripheral artery disease but no other acute findings for his abdominal pain.  The history is provided by the patient.  Chest Pain     Home Medications Prior to Admission medications   Medication Sig Start Date End Date Taking? Authorizing Provider  aluminum hydroxide-magnesium carbonate (GAVISCON) 95-358 MG/15ML SUSP Take 15 mLs by mouth as needed for indigestion.    [provider]  amLODipine (NORVASC) 5 MG tablet Take 0.5 tablets (2.5 mg total) by mouth 2 (two) times daily. 01/03/21   Lorretta Harp, MD  aspirin EC 81 MG tablet Take 1 tablet (81 mg total) by mouth daily. 06/02/16    Almyra Deforest, PA  atorvastatin (LIPITOR) 40 MG tablet Take 1 tablet (40 mg total) by mouth daily. 02/02/21   Lorretta Harp, MD  carvedilol (COREG) 3.125 MG tablet TAKE 1 TABLET BY MOUTH 2 TIMES DAILY WITH A MEAL. 11/16/20   Lorretta Harp, MD  doxazosin (CARDURA) 4 MG tablet TAKE 1 TABLET EVERY DAY 07/26/21   Lorretta Harp, MD  hydrALAZINE (APRESOLINE) 25 MG tablet Take 1 tablet (25 mg total) by mouth 2 (two) times daily. 01/19/21 05/10/21  Lorretta Harp, MD  Na Sulfate-K Sulfate-Mg Sulf (SUPREP BOWEL PREP KIT) 17.5-3.13-1.6 GM/177ML SOLN Take 1 kit by mouth as directed. For colonoscopy prep 05/10/21   Noralyn Pick, NP  pantoprazole (PROTONIX) 40 MG tablet TAKE 1 TABLET BY MOUTH EVERY DAY 09/09/21   Esterwood, Amy S, PA-C  Phenylephrine-DM-GG (ROBAFEN CF MULTI-SYMPTOM COLD) 5-10-100 MG/5ML LIQD Take 5 mLs by mouth daily as needed (cough).    [provider]  Polyethyl Glycol-Propyl Glycol (SYSTANE OP) Place 1 drop into both eyes daily as needed (dry eyes).    [provider]  tamsulosin (FLOMAX) 0.4 MG CAPS capsule TAKE 1 CAPSULES BY MOUTH ONCE DAILY. 05/03/21   Tower, Wynelle Fanny, MD  valsartan (DIOVAN) 320 MG tablet TAKE 1 TABLET BY MOUTH EVERY DAY 09/05/21   Lorretta Harp, MD      Allergies  Patient has no known allergies.    Review of Systems   Review of Systems  Cardiovascular:  Positive for chest pain.   Physical Exam Updated Vital Signs BP 140/74   Pulse (!) 56   Temp 97.9 F (36.6 C)   Resp 14   SpO2 100%  Physical Exam Vitals and nursing note reviewed.  Constitutional:      General: He is not in acute distress.    Appearance: He is well-developed.  HENT:     Head: Normocephalic and atraumatic.  Eyes:     Conjunctiva/sclera: Conjunctivae normal.     Pupils: Pupils are equal, round, and reactive to light.  Cardiovascular:     Rate and Rhythm: Normal rate and regular rhythm.     Heart sounds: No murmur heard. Pulmonary:     Effort:  Pulmonary effort is normal. No respiratory distress.     Breath sounds: Normal breath sounds. No wheezing or rales.  Abdominal:     General: There is no distension.     Palpations: Abdomen is soft.     Tenderness: There is no abdominal tenderness. There is no guarding or rebound.  Musculoskeletal:        General: No tenderness. Normal range of motion.     Cervical back: Normal range of motion and neck supple.  Skin:    General: Skin is warm and dry.     Findings: No erythema or rash.  Neurological:     Mental Status: He is alert and oriented to person, place, and time.  Psychiatric:        Behavior: Behavior normal.    ED Results / Procedures / Treatments   Labs (all labs ordered are listed, but only abnormal results are displayed) Labs Reviewed  BASIC METABOLIC PANEL - Abnormal; Notable for the following components:      Result Value   Glucose, Bld 112 (*)    All other components within normal limits  CBC  HEPATIC FUNCTION PANEL  LIPASE, BLOOD  TROPONIN I (HIGH SENSITIVITY)  TROPONIN I (HIGH SENSITIVITY)    EKG EKG Interpretation  Date/Time:  Monday Sep 12 2021 10:51:54 EDT Ventricular Rate:  75 PR Interval:  150 QRS Duration: 82 QT Interval:  378 QTC Calculation: 422 R Axis:   86 Text Interpretation: Sinus rhythm with occasional Premature ventricular complexes Nonspecific T wave abnormality T wave inversion anterior improved since prior tracing When compared with ECG of 03-Dec-2020 06:36, PREVIOUS ECG IS PRESENT Confirmed by Blanchie Dessert [phone]) on 09/12/2021 11:50:09 AM  Radiology DG Chest 2 View  Result Date: 09/12/2021 CLINICAL DATA:  Chest pain EXAM: CHEST - 2 VIEW COMPARISON:  None Available. FINDINGS: Prior median sternotomy and CABG. The heart size and mediastinal contours are within normal limits. No focal airspace consolidation. No pleural effusion. No pneumothorax. No acute osseous abnormality. Thoracic spondylosis. Surgical clips overlie the epigastric  area. IMPRESSION: No acute cardiopulmonary disease. Electronically Signed   By: Dahlia Bailiff M.D.   On: 09/12/2021 11:14   US Abdomen Limited RUQ (LIVER/GB)  Result Date: 09/12/2021 CLINICAL DATA:  Upper abdominal pain EXAM: ULTRASOUND ABDOMEN LIMITED RIGHT UPPER QUADRANT COMPARISON:  None Available. FINDINGS: Gallbladder: No gallstones or wall thickening visualized. No sonographic Murphy sign noted by sonographer. Common bile duct: Diameter: 0.6 cm Liver: No focal lesion identified. Increased parenchymal echogenicity. Portal vein is patent on color Doppler imaging with normal direction of blood flow towards the liver. Other: None. IMPRESSION: 1. No acute ultrasound findings of the right upper  quadrant to explain pain. Consider CT or MRI to further evaluate otherwise unexplained abdominal pain. 2. Hepatic steatosis. Electronically Signed   By: Delanna Ahmadi M.D.   On: 09/12/2021 13:33    Procedures Procedures    Medications Ordered in ED Medications  alum & mag hydroxide-simeth (MAALOX/MYLANTA) 200-200-20 MG/5ML suspension 30 mL (30 mLs Oral Given 09/12/21 1418)    And  lidocaine (XYLOCAINE) 2 % viscous mouth solution 15 mL (15 mLs Oral Given 09/12/21 1418)    ED Course/ Medical Decision Making/ A&P                           Medical Decision Making Amount and/or Complexity of Data Reviewed Labs: ordered. Radiology: ordered.  Risk OTC drugs. Prescription drug management.   Pt with multiple medical problems and comorbidities and presenting today with a complaint that caries a high risk for morbidity and mortality.  Presenting today with upper abdominal and chest discomfort.  Patient is well-appearing and in no acute distress at this time.  Patient does have a significant cardiac history and had catheterization done in August of last year that showed no clinically significant lesions but he does have multiple areas of 100% stenosis with collateral flow.  Patient also has a history of  ongoing GERD on pantoprazole and prior history of cholelithiasis.  Concern for possible atypical ACS or acute GI pathology such as esophageal spasm and GERD versus cholecystitis.  Patient has no Murphy sign or significant right upper quadrant tenderness but complains more of epigastric and sternal discomfort.  He was given a GI cocktail.  I independently interpreted patient's EKG and labs.  EKG today with improved T wave inversion anteriorly, otherwise no significant changes, CBC, BMP, troponin are all within normal limits.  We will add LFTs and lipase.  We will do a right upper quadrant ultrasound.  I have independently visualized and interpreted pt's images today. With normal chest x-ray today.  Findings discussed with the patient and his wife.  Will return after therapy and remainder of the imaging but suspect that patient's symptoms are GI in nature today and low suspicion for ACS given above normal findings.  3:02 PM Us/ neg for acute findings.         Final Clinical Impression(s) / ED Diagnoses Final diagnoses:  None    Rx / DC Orders ED Discharge Orders     None         Blanchie Dessert, MD 09/12/21 1502

## 2021-09-12 NOTE — ED Provider Notes (Signed)
Patient seen after prior EDP.  Patient is asymptomatic at this time  Patient reports that he feels significantly improved.  Screening labs obtained are without significant abnormality  Patient offered additional observation and/or admission.  He declined same.  Patient is aware of need for close outpatient follow-up.  Both he and his spouse who is at bedside understand need for close follow-up.  Strict return precautions given and understood.   Valarie Merino, MD 09/12/21 [phone]

## 2021-09-12 NOTE — Telephone Encounter (Signed)
[  9:34 AM] Jerry Farrell, can you call Jerry Farrell  MRN [id number]. I sent him to triage and it did not let me do a warm transfer for some reason   I called pt and no answer and no v/m on home phone.I called cell and pts wife (DPR signed) said that pt was already on the phone with [facility] due to Chest and back pain and a lot of burping. Pts wife said "they" are helping pt on the other line. Advised if needed to call [place] back. Mrs Scrima voiced understanding and thanked me for the call. Sending note to Dr Glori Bickers and [place] CMA.

## 2021-09-12 NOTE — Discharge Instructions (Addendum)
Return for any problem.  ?

## 2021-09-13 ENCOUNTER — Inpatient Hospital Stay: Payer: Medicare Other | Admitting: Family Medicine

## 2021-09-20 ENCOUNTER — Encounter: Payer: Self-pay | Admitting: Family Medicine

## 2021-09-20 ENCOUNTER — Ambulatory Visit (INDEPENDENT_AMBULATORY_CARE_PROVIDER_SITE_OTHER): Payer: Medicare Other | Admitting: Family Medicine

## 2021-09-20 VITALS — BP 132/60 | HR 82 | Temp 98.0°F | Ht 67.0 in | Wt 184.1 lb

## 2021-09-20 DIAGNOSIS — I10 Essential (primary) hypertension: Secondary | ICD-10-CM

## 2021-09-20 DIAGNOSIS — M546 Pain in thoracic spine: Secondary | ICD-10-CM

## 2021-09-20 DIAGNOSIS — R55 Syncope and collapse: Secondary | ICD-10-CM | POA: Diagnosis not present

## 2021-09-20 DIAGNOSIS — R079 Chest pain, unspecified: Secondary | ICD-10-CM

## 2021-09-20 DIAGNOSIS — K2100 Gastro-esophageal reflux disease with esophagitis, without bleeding: Secondary | ICD-10-CM | POA: Diagnosis not present

## 2021-09-20 MED ORDER — FAMOTIDINE 20 MG PO TABS: 20.0000 mg | ORAL_TABLET | Freq: Two times a day (BID) | ORAL | 1 refills | Status: DC

## 2021-09-20 NOTE — Patient Instructions (Addendum)
Continue your current medicines  Add pepcid 20 mg twice daily for stomach acid  Watch your diet for foods that worsen symptoms or cause burping   Keep up a good fluid intake   I want to work on a follow up with GI and cardiology  Go ahead and call both offices tomorrow to make appt with Dr Fuller Plan and Dr Gwenlyn Found   If symptoms worsen or suddenly become severe, go back to the ER

## 2021-09-20 NOTE — Assessment & Plan Note (Signed)
Chest and upper R back/scapula area  Reassuring ER w/u Reviewed hospital records, lab results and studies in detail    Plan f/u with cardiology and also GI (did add pepcid for poss GERD today)

## 2021-09-20 NOTE — Assessment & Plan Note (Signed)
bp in fair control at this time  BP Readings from Last 1 Encounters:  09/20/21 132/60   No changes needed Most recent labs reviewed  Disc lifstyle change with low sodium diet and exercise  Under cardiology care, continues Coreg 3.125 mg bid Diovan 320 mg daily Cardura 4 mg daily Amlodipine 2.5 mg bid  Hydralazine 25 mg bid

## 2021-09-20 NOTE — Assessment & Plan Note (Signed)
This occ wraps to chest  Reassuring ER w/u  [place] reviewed-no gallstones/does have fatty liver  Nl exam   ? If related to GERD Will add pepcid 20 mg bid GI and cardiol f/u

## 2021-09-20 NOTE — Progress Notes (Signed)
Subjective:    Patient ID: Jerry Farrell, male    DOB: 1945-12-16, 76 y.o.   MRN: [id number]  HPI Pt presents for f/u of ED visit for chest discomfort   Wt Readings from Last 3 Encounters:  09/20/21 184 lb 2 oz (83.5 kg)  07/12/21 186 lb (84.4 kg)  06/24/21 180 lb (81.6 kg)   28.84 kg/m   History of CAD with CABG and GERD (takes ppi) and remote h/o partial gastrectomy for ulcer dz  Presented with cp and burping that did not improve with tums   Results for orders placed or performed during the hospital encounter of 33/00/76  Basic metabolic panel  Result Value Ref Range   Sodium 139 135 - 145 mmol/L   Potassium 3.9 3.5 - 5.1 mmol/L   Chloride 108 98 - 111 mmol/L   CO2 25 22 - 32 mmol/L   Glucose, Bld 112 (H) 70 - 99 mg/dL   BUN 13 8 - 23 mg/dL   Creatinine, Ser 0.95 0.61 - 1.24 mg/dL   Calcium 9.0 8.9 - 10.3 mg/dL   GFR, Estimated >60 >60 mL/min   Anion gap 6 5 - 15  CBC  Result Value Ref Range   WBC 6.6 4.0 - 10.5 K/uL   RBC 5.22 4.22 - 5.81 MIL/uL   Hemoglobin 15.3 13.0 - 17.0 g/dL   HCT 45.5 39.0 - 52.0 %   MCV 87.2 80.0 - 100.0 fL   MCH 29.3 26.0 - 34.0 pg   MCHC 33.6 30.0 - 36.0 g/dL   RDW 12.8 11.5 - 15.5 %   Platelets 160 150 - 400 K/uL   nRBC 0.0 0.0 - 0.2 %  Hepatic function panel  Result Value Ref Range   Total Protein 6.7 6.5 - 8.1 g/dL   Albumin 3.8 3.5 - 5.0 g/dL   AST 24 15 - 41 U/L   ALT 31 0 - 44 U/L   Alkaline Phosphatase 113 38 - 126 U/L   Total Bilirubin 1.4 (H) 0.3 - 1.2 mg/dL   Bilirubin, Direct 0.2 0.0 - 0.2 mg/dL   Indirect Bilirubin 1.2 (H) 0.3 - 0.9 mg/dL  Lipase, blood  Result Value Ref Range   Lipase 25 11 - 51 U/L  Troponin I (High Sensitivity)  Result Value Ref Range   Troponin I (High Sensitivity) 4 <18 ng/L  Troponin I (High Sensitivity)  Result Value Ref Range   Troponin I (High Sensitivity) 5 <18 ng/L    EKG was baseline (T wave inversion anteriorly)  DG Chest 2 View  Result Date: 09/12/2021 CLINICAL DATA:  Chest  pain EXAM: CHEST - 2 VIEW COMPARISON:  None Available. FINDINGS: Prior median sternotomy and CABG. The heart size and mediastinal contours are within normal limits. No focal airspace consolidation. No pleural effusion. No pneumothorax. No acute osseous abnormality. Thoracic spondylosis. Surgical clips overlie the epigastric area. IMPRESSION: No acute cardiopulmonary disease. Electronically Signed   By: Dahlia Bailiff M.D.   On: 09/12/2021 11:14   US Abdomen Limited RUQ (LIVER/GB)  Result Date: 09/12/2021 CLINICAL DATA:  Upper abdominal pain EXAM: ULTRASOUND ABDOMEN LIMITED RIGHT UPPER QUADRANT COMPARISON:  None Available. FINDINGS: Gallbladder: No gallstones or wall thickening visualized. No sonographic Murphy sign noted by sonographer. Common bile duct: Diameter: 0.6 cm Liver: No focal lesion identified. Increased parenchymal echogenicity. Portal vein is patent on color Doppler imaging with normal direction of blood flow towards the liver. Other: None. IMPRESSION: 1. No acute ultrasound findings of the right upper quadrant  to explain pain. Consider CT or MRI to further evaluate otherwise unexplained abdominal pain. 2. Hepatic steatosis. Electronically Signed   By: Delanna Ahmadi M.D.   On: 09/12/2021 13:33    He was tx with a GI cocktail Unsure if this helped   Urged to continue protonix 40 mg daily from GI  BP Readings from Last 3 Encounters:  09/20/21 132/60  09/12/21 (!) 143/78  07/12/21 140/82    Pulse Readings from Last 3 Encounters:  09/20/21 82  09/12/21 63  07/12/21 64   Coreg 3.125 mg bid Diovan 320 mg daily Cardura 4 mg daily Amlodipine 2.5 mg bid  Hydralazine 25 mg bid   Today his pain is under R shoulder blade /into back  Gets sob when he bends forward  Feels faint/weak at times   No n/v Lots of belching recently    Sees Dr Fuller Plan for GI   Patient Active Problem List   Diagnosis Date Noted   Right-sided thoracic back pain 09/20/2021   Pre-syncope 09/20/2021   CAD  in native artery 12/02/2020   Chest pain of uncertain etiology    Thrombocytopenia ([place]) 10/07/2020   Pedal edema 03/28/2020   Gallstones 06/26/2019   Coronary artery disease 05/20/2019   Constipation 08/30/2018   Fatigue 08/30/2018   Hyponatremia 08/30/2018   Dizziness 08/26/2018   Peptic ulcer disease 06/14/2016   Iron deficiency anemia 06/14/2016   B12 deficiency 06/14/2016   Prediabetes 01/31/2016   Need for hepatitis C screening test 01/25/2016   Post-nasal drip 12/10/2015   GERD (gastroesophageal reflux disease) 07/20/2015   Routine general medical examination at a health care facility 10/28/2014   Elevated alkaline phosphatase level 10/28/2014   BPH (benign prostatic hyperplasia) 04/29/2014   Prostate cancer screening 08/11/2013   Encounter for Medicare annual wellness exam 08/11/2013   Dyslipidemia, goal LDL below 70 01/09/2007   ERECTILE DYSFUNCTION 01/09/2007   Essential hypertension 01/09/2007   Hx of CABG 01/09/2007   ESOPHAGITIS 01/09/2007   Past Medical History:  Diagnosis Date   Adenomatous colon polyp    BPH (benign prostatic hyperplasia)    CAD (coronary artery disease)    cardiolite ok 3/05, stress test- low risk study 11/06   Chronic low back pain    with radiculopathy   ED (erectile dysfunction)    Gallstones    abd [place]- stable hamangioma, gallbladder sludge and tiny stones 09/2003   Gastritis    GERD (gastroesophageal reflux disease)    H pylori ulcer    Hyperlipidemia    Hypertension    Transfusion history    Past Surgical History:  Procedure Laterality Date   CATARACT EXTRACTION W/PHACO  03/13/2012   Procedure: CATARACT EXTRACTION PHACO AND INTRAOCULAR LENS PLACEMENT ([place]);  Surgeon: Adonis Brook, MD;  Location: [place];  Service: Ophthalmology;  Laterality: Left;   CORONARY ARTERY BYPASS GRAFT  1998   x 1  done here [facility]   ESOPHAGOGASTRODUODENOSCOPY (EGD) WITH PROPOFOL N/A 06/07/2016   Procedure: ESOPHAGOGASTRODUODENOSCOPY (EGD) WITH  PROPOFOL;  Surgeon: Mauri Pole, MD;  Location: [facility];  Service: Endoscopy;  Laterality: N/A;   GASTRECTOMY     partial   LEFT HEART CATH AND CORS/GRAFTS ANGIOGRAPHY N/A 12/02/2020   Procedure: LEFT HEART CATH AND CORS/GRAFTS ANGIOGRAPHY;  Surgeon: Lorretta Harp, MD;  Location: [place] [facility];  Service: Cardiovascular;  Laterality: N/A;   MEMBRANE PEEL  03/13/2012   Procedure: MEMBRANE PEEL;  Surgeon: Adonis Brook, MD;  Location: [place];  Service: Ophthalmology;  Laterality: Left;   PARS PLANA VITRECTOMY  03/13/2012   Procedure: PARS PLANA VITRECTOMY WITH 23 GAUGE;  Surgeon: Adonis Brook, MD;  Location: [place];  Service: Ophthalmology;  Laterality: Left;   Social History   Tobacco Use   Smoking status: Never   Smokeless tobacco: Never  Vaping Use   Vaping Use: Never used  Substance Use Topics   Alcohol use: No    Alcohol/week: 0.0 standard drinks   Drug use: No   Family History  Problem Relation Age of Onset   Hypertension Mother    Throat cancer Brother        pt feels alcohol related   Colon cancer Brother        1/2 brother   Stomach cancer Neg Hx    Pancreatic cancer Neg Hx    No Known Allergies Current Outpatient Medications on File Prior to Visit  Medication Sig Dispense Refill   aluminum hydroxide-magnesium carbonate (GAVISCON) 95-358 MG/15ML SUSP Take 15 mLs by mouth as needed for indigestion.     amLODipine (NORVASC) 5 MG tablet Take 0.5 tablets (2.5 mg total) by mouth 2 (two) times daily. 90 tablet 3   aspirin EC 81 MG tablet Take 1 tablet (81 mg total) by mouth daily. 90 tablet 3   atorvastatin (LIPITOR) 40 MG tablet Take 1 tablet (40 mg total) by mouth daily. 90 tablet 3   carvedilol (COREG) 3.125 MG tablet TAKE 1 TABLET BY MOUTH 2 TIMES DAILY WITH A MEAL. 180 tablet 3   doxazosin (CARDURA) 4 MG tablet TAKE 1 TABLET EVERY DAY 90 tablet 2   pantoprazole (PROTONIX) 40 MG tablet TAKE 1 TABLET BY MOUTH EVERY DAY 90 tablet 0   Phenylephrine-DM-GG  (ROBAFEN CF MULTI-SYMPTOM COLD) 5-10-100 MG/5ML LIQD Take 5 mLs by mouth daily as needed (cough).     Polyethyl Glycol-Propyl Glycol (SYSTANE OP) Place 1 drop into both eyes daily as needed (dry eyes).     tamsulosin (FLOMAX) 0.4 MG CAPS capsule TAKE 1 CAPSULES BY MOUTH ONCE DAILY. 90 capsule 1   valsartan (DIOVAN) 320 MG tablet TAKE 1 TABLET BY MOUTH EVERY DAY 90 tablet 3   hydrALAZINE (APRESOLINE) 25 MG tablet Take 1 tablet (25 mg total) by mouth 2 (two) times daily. 180 tablet 3   No current facility-administered medications on file prior to visit.    Review of Systems  Constitutional:  Negative for activity change, appetite change, fatigue, fever and unexpected weight change.  HENT:  Negative for congestion, rhinorrhea, sore throat and trouble swallowing.   Eyes:  Negative for pain, redness, itching and visual disturbance.  Respiratory:  Negative for apnea, cough, choking, chest tightness, shortness of breath, wheezing and stridor.   Cardiovascular:  Negative for chest pain, palpitations and leg swelling.  Gastrointestinal:  Negative for abdominal pain, blood in stool, constipation, diarrhea and nausea.       Some acid reflux   Endocrine: Negative for cold intolerance, heat intolerance, polydipsia and polyuria.  Genitourinary:  Negative for difficulty urinating, dysuria, frequency and urgency.  Musculoskeletal:  Positive for back pain. Negative for arthralgias, joint swelling and myalgias.  Skin:  Negative for pallor and rash.  Neurological:  Negative for dizziness, tremors, weakness, numbness and headaches.  Hematological:  Negative for adenopathy. Does not bruise/bleed easily.  Psychiatric/Behavioral:  Negative for decreased concentration and dysphoric mood. The patient is not nervous/anxious.       Objective:   Physical Exam Constitutional:      General: He is not in  acute distress.    Appearance: Normal appearance. He is well-developed and normal weight. He is not ill-appearing  or diaphoretic.  HENT:     Head: Normocephalic and atraumatic.  Eyes:     Conjunctiva/sclera: Conjunctivae normal.     Pupils: Pupils are equal, round, and reactive to light.  Neck:     Thyroid: No thyromegaly.     Vascular: No carotid bruit or JVD.  Cardiovascular:     Rate and Rhythm: Normal rate and regular rhythm.     Heart sounds: Normal heart sounds.    No gallop.  Pulmonary:     Effort: Pulmonary effort is normal. No respiratory distress.     Breath sounds: Normal breath sounds. No wheezing or rales.  Abdominal:     General: Abdomen is flat. Bowel sounds are normal. There is no distension or abdominal bruit.     Palpations: Abdomen is soft. There is no hepatomegaly, splenomegaly, mass or pulsatile mass.     Tenderness: There is no abdominal tenderness. There is no right CVA tenderness, left CVA tenderness, guarding or rebound. Negative signs include Murphy's sign and McBurney's sign.  Musculoskeletal:     Cervical back: Normal range of motion and neck supple.     Right lower leg: No edema.     Left lower leg: No edema.     Comments: No TS tenderness No muscular tenderness No cva tenderness   Lymphadenopathy:     Cervical: No cervical adenopathy.  Skin:    General: Skin is warm and dry.     Coloration: Skin is not pale.     Findings: No rash.     Comments: No rash/skin changes   Neurological:     Mental Status: He is alert.     Cranial Nerves: No cranial nerve deficit.     Motor: No weakness.     Coordination: Coordination normal.     Deep Tendon Reflexes: Reflexes are normal and symmetric. Reflexes normal.  Psychiatric:        Mood and Affect: Mood normal.          Assessment & Plan:   Problem List Items Addressed This Visit       Cardiovascular and Mediastinum   Essential hypertension    bp in fair control at this time  BP Readings from Last 1 Encounters:  09/20/21 132/60  No changes needed Most recent labs reviewed  Disc lifstyle change with low  sodium diet and exercise  Under cardiology care, continues Coreg 3.125 mg bid Diovan 320 mg daily Cardura 4 mg daily Amlodipine 2.5 mg bid  Hydralazine 25 mg bid       Pre-syncope    Episodes of feeling faint at home No orthostatic changes  Some chest/back pain as well   Rev recent ER notes and reassuring [place]   Recommend cardiology f/u       Relevant Orders   Ambulatory referral to Cardiology     Digestive   GERD (gastroesophageal reflux disease) - Primary    In setting of past peptic ulcer dz, and recent reassuring ER w/u  ? If adding to his R scapula area and chest pain  abd [place] was nl  ? Past gallstones   Will continue protonix  Add pepcid 20 mg bid and update  Ref for f/u with GI       Relevant Medications   famotidine (PEPCID) 20 MG tablet   Other Relevant Orders   Ambulatory referral to Gastroenterology  Other   Chest pain of uncertain etiology    Chest and upper R back/scapula area  Reassuring ER w/u Reviewed hospital records, lab results and studies in detail    Plan f/u with cardiology and also GI (did add pepcid for poss GERD today)       Relevant Orders   Ambulatory referral to Cardiology   Ambulatory referral to Gastroenterology   Right-sided thoracic back pain    This occ wraps to chest  Reassuring ER w/u  [place] reviewed-no gallstones/does have fatty liver  Nl exam   ? If related to GERD Will add pepcid 20 mg bid GI and cardiol f/u       Relevant Orders   Ambulatory referral to Cardiology   Ambulatory referral to Gastroenterology

## 2021-09-20 NOTE — Assessment & Plan Note (Signed)
Episodes of feeling faint at home No orthostatic changes  Some chest/back pain as well   Rev recent ER notes and reassuring [place]   Recommend cardiology f/u

## 2021-09-20 NOTE — Assessment & Plan Note (Signed)
In setting of past peptic ulcer dz, and recent reassuring ER w/u  ? If adding to his R scapula area and chest pain  abd [place] was nl  ? Past gallstones   Will continue protonix  Add pepcid 20 mg bid and update  Ref for f/u with GI

## 2021-09-22 NOTE — Progress Notes (Unsigned)
Cardiology Office Note:    Date:  09/23/2021   ID:  Jerry Farrell, Jerry Farrell 07-06-1945, MRN [id number]  PCP:  Abner Greenspan, MD   [facility] Providers Cardiologist:  Quay Burow, MD     Referring MD: Abner Greenspan, MD   Chief Complaint: follow-up chest pain  History of Present Illness:    Jerry Farrell is a very pleasant 76 y.o. male with a hx of CAD s/p CABG in 1994, hypertension, hyperlipidemia, GERD, and BPH.   Had hyponatremia on HCTZ.   Cardiac catheterization 12/02/2020 revealed occluded proximal LAD with a patent LIMA to LAD, left to left collaterals from the circumflex to an occluded diagonal branch, and an occluded native RCA and RCA vein graft, normal LVEF  He was last seen in our office 12/21/20 by Dr. Gwenlyn Found at which time 12 month follow-up was recommended.  ED visit 09/12/21 for discomfort in center of chest that goes up to his throat and around to right side. Troponins negative x 2. EKG showed no significant change. Upper right quadrant u/s revealed nothing acute. Saw PCP 5/30 and referral was made to GI.  Today, he is here today alone for follow-up. Reports some bloating and gas pressure in upper chest, right shoulder blade, and in back, more noticeable after meals.  Feels better after he eats since since starting GI meds. No chest discomfort that he feels is angina but felt better a few months ago. Gets more tired over the last few months when he is push mowing the front lawn. Mild nonspecific discomfort walking in from parking lot today. Gets lightheaded on occasion when he gets up and starts moving around - not every day and no specific time of day. Palpitations occurring recently and leg swelling when sitting on computer for a while.  He denies orthopnea, PND, presyncope, syncope.  No bleeding concerns.  Past Medical History:  Diagnosis Date   Adenomatous colon polyp    BPH (benign prostatic hyperplasia)    CAD (coronary artery disease)    cardiolite ok 3/05,  stress test- low risk study 11/06   Chronic low back pain    with radiculopathy   ED (erectile dysfunction)    Gallstones    abd [place]- stable hamangioma, gallbladder sludge and tiny stones 09/2003   Gastritis    GERD (gastroesophageal reflux disease)    H pylori ulcer    Hyperlipidemia    Hypertension    Transfusion history     Past Surgical History:  Procedure Laterality Date   CATARACT EXTRACTION W/PHACO  03/13/2012   Procedure: CATARACT EXTRACTION PHACO AND INTRAOCULAR LENS PLACEMENT ([place]);  Surgeon: Adonis Brook, MD;  Location: [place];  Service: Ophthalmology;  Laterality: Left;   CORONARY ARTERY BYPASS GRAFT  1998   x 1  [facility]   ESOPHAGOGASTRODUODENOSCOPY (EGD) WITH PROPOFOL N/A 06/07/2016   Procedure: ESOPHAGOGASTRODUODENOSCOPY (EGD) WITH PROPOFOL;  Surgeon: Mauri Pole, MD;  Location: [facility];  Service: Endoscopy;  Laterality: N/A;   GASTRECTOMY     partial   LEFT HEART CATH AND CORS/GRAFTS ANGIOGRAPHY N/A 12/02/2020   Procedure: LEFT HEART CATH AND CORS/GRAFTS ANGIOGRAPHY;  Surgeon: Lorretta Harp, MD;  Location: [place] [facility];  Service: Cardiovascular;  Laterality: N/A;   MEMBRANE PEEL  03/13/2012   Procedure: MEMBRANE PEEL;  Surgeon: Adonis Brook, MD;  Location: [place];  Service: Ophthalmology;  Laterality: Left;   PARS PLANA VITRECTOMY  03/13/2012   Procedure: PARS PLANA VITRECTOMY WITH 23  GAUGE;  Surgeon: Adonis Brook, MD;  Location: [place];  Service: Ophthalmology;  Laterality: Left;    Current Medications: Current Meds  Medication Sig   aluminum hydroxide-magnesium carbonate (GAVISCON) 95-358 MG/15ML SUSP Take 15 mLs by mouth as needed for indigestion.   amLODipine (NORVASC) 5 MG tablet Take 0.5 tablets (2.5 mg total) by mouth 2 (two) times daily.   aspirin EC 81 MG tablet Take 1 tablet (81 mg total) by mouth daily.   atorvastatin (LIPITOR) 40 MG tablet Take 1 tablet (40 mg total) by mouth daily.   carvedilol (COREG) 3.125 MG tablet TAKE 1  TABLET BY MOUTH 2 TIMES DAILY WITH A MEAL.   doxazosin (CARDURA) 4 MG tablet TAKE 1 TABLET EVERY DAY   famotidine (PEPCID) 20 MG tablet Take 1 tablet (20 mg total) by mouth 2 (two) times daily.   hydrALAZINE (APRESOLINE) 25 MG tablet Take 1 tablet (25 mg total) by mouth 2 (two) times daily.   pantoprazole (PROTONIX) 40 MG tablet TAKE 1 TABLET BY MOUTH EVERY DAY   Phenylephrine-DM-GG (ROBAFEN CF MULTI-SYMPTOM COLD) 5-10-100 MG/5ML LIQD Take 5 mLs by mouth daily as needed (cough).   Polyethyl Glycol-Propyl Glycol (SYSTANE OP) Place 1 drop into both eyes daily as needed (dry eyes).   tamsulosin (FLOMAX) 0.4 MG CAPS capsule TAKE 1 CAPSULES BY MOUTH ONCE DAILY.   valsartan (DIOVAN) 320 MG tablet TAKE 1 TABLET BY MOUTH EVERY DAY     Allergies:   Patient has no known allergies.   Social History   Socioeconomic History   Marital status: Married    Spouse name: Not on file   Number of children: 3   Years of education: Not on file   Highest education level: Not on file  Occupational History   Occupation: [employer]    Employer: RETIRED  Tobacco Use   Smoking status: Never   Smokeless tobacco: Never  Vaping Use   Vaping Use: Never used  Substance and Sexual Activity   Alcohol use: No    Alcohol/week: 0.0 standard drinks   Drug use: No   Sexual activity: Yes  Other Topics Concern   Not on file  Social History Narrative   Not on file   Social Determinants of Health   Financial Resource Strain: Low Risk    Difficulty of Paying Living Expenses: Not hard at all  Food Insecurity: No Food Insecurity   Worried About Charity fundraiser in the Last Year: Never true   [place] in the Last Year: Never true  Transportation Needs: No Transportation Needs   Lack of Transportation (Medical): No   Lack of Transportation (Non-Medical): No  Physical Activity: Unknown   Days of Exercise per Week: 3 days   Minutes of Exercise per Session: Not on file  Stress: No Stress Concern Present    Feeling of Stress : Not at all  Social Connections: Socially Integrated   Frequency of Communication with Friends and Family: More than three times a week   Frequency of Social Gatherings with Friends and Family: More than three times a week   Attends Religious Services: More than 4 times per year   Active Member of [employer] or Organizations: Yes   Attends Music therapist: More than 4 times per year   Marital Status: Married     Family History: The patient's family history includes Colon cancer in his brother; Hypertension in his mother; Throat cancer in his brother. There is no history of Stomach cancer  or Pancreatic cancer.  ROS:   Please see the history of present illness.    + palpitations + bloating + activity intolerance All other systems reviewed and are negative.  Labs/Other Studies Reviewed:    The following studies were reviewed today:  LHC 12/02/20    Ost RCA to Prox RCA lesion is 100% stenosed.   Ost LAD to Mid LAD lesion is 100% stenosed.   2nd Diag lesion is 100% stenosed.   Origin to Prox Graft lesion is 100% stenosed.   Ramus lesion is 50% stenosed.   The left ventricular systolic function is normal.   The left ventricular ejection fraction is 55-65% by visual estimate.   VAS US Carotid Duplex 06/27/19  Right Carotid: Velocities in the right ICA are consistent with a 1-39%  stenosis.   Left Carotid: There is no evidence of stenosis in the left ICA.   Vertebrals:  Bilateral vertebral arteries demonstrate antegrade flow.  Subclavians: Right subclavian artery flow was disturbed. Normal flow               hemodynamics were seen in the left subclavian artery.   Myoview 04/11/16  The left ventricular ejection fraction is normal (55-65%). Nuclear stress EF: 63%. Blood pressure demonstrated a hypertensive response to exercise. Horizontal ST segment depression ST segment depression of 4 mm was noted during stress in the II, III, aVF, V6, V5 and V4  leads, and returning to baseline after 1-5 minutes of recovery. This is a low risk study.   Normal perfusion. Mildly reduced exercise capacity with hypertensive response to exercise. Significant horizontal ST depression of 4 mm inferolaterally in the setting of LVH - may represent repolarization abnormality given normal perfusion. LVEF 63% with normal wall motion. This is a low risk study.    Recent Labs: 09/30/2020: TSH 0.62 09/12/2021: ALT 31; BUN 13; Creatinine, Ser 0.95; Hemoglobin 15.3; Platelets 160; Potassium 3.9; Sodium 139  Recent Lipid Panel    Component Value Date/Time   CHOL 138 09/30/2020 0738   CHOL 148 06/18/2020 1023   TRIG 130.0 09/30/2020 0738   HDL 46.80 09/30/2020 0738   HDL 59 06/18/2020 1023   CHOLHDL 3 09/30/2020 0738   VLDL 26.0 09/30/2020 0738   LDLCALC 65 09/30/2020 0738   LDLCALC 69 06/18/2020 1023   LDLDIRECT 80.0 05/08/2018 0920     Risk Assessment/Calculations:      Physical Exam:    VS:  BP (!) 128/56   Pulse 72   Ht '5\' 7"'$  (1.702 m)   Wt 182 lb 12.8 oz (82.9 kg)   SpO2 98%   BMI 28.63 kg/m     Wt Readings from Last 3 Encounters:  09/23/21 182 lb 12.8 oz (82.9 kg)  09/20/21 184 lb 2 oz (83.5 kg)  07/12/21 186 lb (84.4 kg)     GEN:  Well nourished, well developed in no acute distress HEENT: Normal NECK: No JVD; No carotid bruits CARDIAC: RRR, no murmurs, rubs, gallops RESPIRATORY:  Clear to auscultation without rales, wheezing or rhonchi  ABDOMEN: Soft, non-tender, non-distended MUSCULOSKELETAL:  No edema; No deformity. 2+ pedal pulses, equal bilaterally SKIN: Warm and dry NEUROLOGIC:  Alert and oriented x 3 PSYCHIATRIC:  Normal affect   EKG:  EKG is not ordered today.   Diagnoses:    1. Dyslipidemia, goal LDL below 70   2. Palpitations   3. Coronary artery disease involving native coronary artery of native heart without angina pectoris   4. Hyperlipidemia LDL goal <70  5. Essential hypertension   6. Hx of CABG    Assessment  and Plan:     Palpitations: He reports occasional palpitations that occur at various times, most noticeable when he lies down. Has occasional lightheadedness as well, cannot specifically identify that these occur in sync. We will place a 14 day Zio monitor for evaluation of arrhythmia.  CAD s/p CABG without angina: He does not feel recent chest discomfort is anginal equivalent.  No indication for further ischemic evaluation at this time.  Continue statin, amlodipine, aspirin, carvedilol.   Hypertension: BP is well controlled today.  No reports of recent concerns over elevated BP.  No medication changes today.  Hyperlipidemia: LDL 65 in 09/2020.  He will go to [place] on lab next week for fasting liver and lipid panel.  Continue atorvastatin.  Disposition: 3 months with Dr. Gwenlyn Found or APP     Medication Adjustments/Labs and Tests Ordered: Current medicines are reviewed at length with the patient today.  Concerns regarding medicines are outlined above.  Orders Placed This Encounter  Procedures   Hepatic function panel   Lipid Profile   LONG TERM MONITOR (3-14 DAYS)   No orders of the defined types were placed in this encounter.   Patient Instructions  Medication Instructions:  Your physician recommends that you continue on your current medications as directed. Please refer to the Current Medication list given to you today.  *If you need a refill on your cardiac medications before your next appointment, please call your pharmacy*   Lab Work: Lipid and liver next week at the [facility] office If you have labs (blood work) drawn today and your tests are completely normal, you will receive your results only by: [place] (if you have MyChart) OR A paper copy in the mail If you have any lab test that is abnormal or we need to change your treatment, we will call you to review the results.  Follow-Up: At [facility], you and your health needs are our priority.  As part of our  continuing mission to provide you with exceptional heart care, we have created designated Provider Care Teams.  These Care Teams include your primary Cardiologist (physician) and Advanced Practice Providers (APPs -  Physician Assistants and Nurse Practitioners) who all work together to provide you with the care you need, when you need it.   Your next appointment:   3 month(s)  The format for your next appointment:   In Person  Provider:   Quay Burow, MD or APP  If primary card or EP is not listed click here to update    :1}    Other Instructions ZIO XT- Long Term Monitor Instructions  Your physician has requested you wear a ZIO patch monitor for 14 days.  This is a single patch monitor. [employer] supplies one patch monitor per enrollment. Additional stickers are not available. Please do not apply patch if you will be having a Nuclear Stress Test,  Echocardiogram, Cardiac CT, MRI, or Chest Xray during the period you would be wearing the  monitor. The patch cannot be worn during these tests. You cannot remove and re-apply the  ZIO XT patch monitor.  Your ZIO patch monitor will be mailed 3 day USPS to your address on file. It may take 3-5 days  to receive your monitor after you have been enrolled.  Once you have received your monitor, please review the enclosed instructions. Your monitor  has already been registered assigning a specific monitor serial #  to you.  Billing and Patient Assistance Program Information  We have supplied [employer] with any of your insurance information on file for billing purposes. [employer] offers a sliding scale Patient Assistance Program for patients that do not have  insurance, or whose insurance does not completely cover the cost of the ZIO monitor.  You must apply for the Patient Assistance Program to qualify for this discounted rate.  To apply, please call [employer] at [phone], select option 4, select option 2, ask to apply for  Patient Assistance  Program. Theodore Demark will ask your household income, and how many people  are in your household. They will quote your out-of-pocket cost based on that information.  [employer] will also be able to set up a 20-month interest-free payment plan if needed.  Applying the monitor   Shave hair from upper left chest.  Hold abrader disc by orange tab. Rub abrader in 40 strokes over the upper left chest as  indicated in your monitor instructions.  Clean area with 4 enclosed alcohol pads. Let dry.  Apply patch as indicated in monitor instructions. Patch will be placed under collarbone on left  side of chest with arrow pointing upward.  Rub patch adhesive wings for 2 minutes. Remove white label marked "1". Remove the white  label marked "2". Rub patch adhesive wings for 2 additional minutes.  While looking in a mirror, press and release button in center of patch. A small green light will  flash 3-4 times. This will be your only indicator that the monitor has been turned on.  Do not shower for the first 24 hours. You may shower after the first 24 hours.  Press the button if you feel a symptom. You will hear a small click. Record Date, Time and  Symptom in the Patient Logbook.  When you are ready to remove the patch, follow instructions on the last 2 pages of Patient  Logbook. Stick patch monitor onto the last page of Patient Logbook.  Place Patient Logbook in the blue and white box. Use locking tab on box and tape box closed  securely. The blue and white box has prepaid postage on it. Please place it in the mailbox as  soon as possible. Your physician should have your test results approximately 7 days after the  monitor has been [facility] to [facility]  Call [place] [phone] you have questions regarding  your ZIO XT patch monitor. Call them immediately if you see an orange light blinking on your  monitor.  If your monitor falls off in less than 4 days, contact our  Monitor department at [phone]  If your monitor becomes loose or falls off after 4 days call [employer] at [phone]  suggestions on securing your monitor   Important Information About Sugar         Signed, SEmmaline Life NP  09/23/2021 5:34 PM    [place]

## 2021-09-23 ENCOUNTER — Ambulatory Visit: Payer: Medicare Other | Admitting: Nurse Practitioner

## 2021-09-23 ENCOUNTER — Ambulatory Visit (INDEPENDENT_AMBULATORY_CARE_PROVIDER_SITE_OTHER): Payer: Medicare Other

## 2021-09-23 ENCOUNTER — Encounter: Payer: Self-pay | Admitting: Nurse Practitioner

## 2021-09-23 VITALS — BP 128/56 | HR 72 | Ht 67.0 in | Wt 182.8 lb

## 2021-09-23 DIAGNOSIS — I2510 Atherosclerotic heart disease of native coronary artery without angina pectoris: Secondary | ICD-10-CM

## 2021-09-23 DIAGNOSIS — I10 Essential (primary) hypertension: Secondary | ICD-10-CM

## 2021-09-23 DIAGNOSIS — E785 Hyperlipidemia, unspecified: Secondary | ICD-10-CM | POA: Diagnosis not present

## 2021-09-23 DIAGNOSIS — R002 Palpitations: Secondary | ICD-10-CM

## 2021-09-23 DIAGNOSIS — Z951 Presence of aortocoronary bypass graft: Secondary | ICD-10-CM | POA: Diagnosis not present

## 2021-09-23 NOTE — Progress Notes (Unsigned)
Enrolled patient for a 14 day Zio XT monitor to be mailed to patients home   Dr Berry to read 

## 2021-09-23 NOTE — Patient Instructions (Signed)
Medication Instructions:  Your physician recommends that you continue on your current medications as directed. Please refer to the Current Medication list given to you today.  *If you need a refill on your cardiac medications before your next appointment, please call your pharmacy*   Lab Work: Lipid and liver next week at the [facility] office If you have labs (blood work) drawn today and your tests are completely normal, you will receive your results only by: [place] (if you have MyChart) OR A paper copy in the mail If you have any lab test that is abnormal or we need to change your treatment, we will call you to review the results.  Follow-Up: At [facility], you and your health needs are our priority.  As part of our continuing mission to provide you with exceptional heart care, we have created designated Provider Care Teams.  These Care Teams include your primary Cardiologist (physician) and Advanced Practice Providers (APPs -  Physician Assistants and Nurse Practitioners) who all work together to provide you with the care you need, when you need it.   Your next appointment:   3 month(s)  The format for your next appointment:   In Person  Provider:   Quay Burow, MD or APP  If primary card or EP is not listed click here to update    :1}    Other Instructions ZIO XT- Long Term Monitor Instructions  Your physician has requested you wear a ZIO patch monitor for 14 days.  This is a single patch monitor. [employer] supplies one patch monitor per enrollment. Additional stickers are not available. Please do not apply patch if you will be having a Nuclear Stress Test,  Echocardiogram, Cardiac CT, MRI, or Chest Xray during the period you would be wearing the  monitor. The patch cannot be worn during these tests. You cannot remove and re-apply the  ZIO XT patch monitor.  Your ZIO patch monitor will be mailed 3 day USPS to your address on file. It may take 3-5 days  to receive  your monitor after you have been enrolled.  Once you have received your monitor, please review the enclosed instructions. Your monitor  has already been registered assigning a specific monitor serial # to you.  Billing and Patient Assistance Program Information  We have supplied [employer] with any of your insurance information on file for billing purposes. [employer] offers a sliding scale Patient Assistance Program for patients that do not have  insurance, or whose insurance does not completely cover the cost of the ZIO monitor.  You must apply for the Patient Assistance Program to qualify for this discounted rate.  To apply, please call [employer] at [phone], select option 4, select option 2, ask to apply for  Patient Assistance Program. Theodore Demark will ask your household income, and how many people  are in your household. They will quote your out-of-pocket cost based on that information.  [employer] will also be able to set up a 29-month interest-free payment plan if needed.  Applying the monitor   Shave hair from upper left chest.  Hold abrader disc by orange tab. Rub abrader in 40 strokes over the upper left chest as  indicated in your monitor instructions.  Clean area with 4 enclosed alcohol pads. Let dry.  Apply patch as indicated in monitor instructions. Patch will be placed under collarbone on left  side of chest with arrow pointing upward.  Rub patch adhesive wings for 2 minutes. Remove white label marked "1". Remove  the white  label marked "2". Rub patch adhesive wings for 2 additional minutes.  While looking in a mirror, press and release button in center of patch. A small green light will  flash 3-4 times. This will be your only indicator that the monitor has been turned on.  Do not shower for the first 24 hours. You may shower after the first 24 hours.  Press the button if you feel a symptom. You will hear a small click. Record Date, Time and  Symptom in the Patient Logbook.  When  you are ready to remove the patch, follow instructions on the last 2 pages of Patient  Logbook. Stick patch monitor onto the last page of Patient Logbook.  Place Patient Logbook in the blue and white box. Use locking tab on box and tape box closed  securely. The blue and white box has prepaid postage on it. Please place it in the mailbox as  soon as possible. Your physician should have your test results approximately 7 days after the  monitor has been mailed back to [facility].  Call [place] at [phone] if you have questions regarding  your ZIO XT patch monitor. Call them immediately if you see an orange light blinking on your  monitor.  If your monitor falls off in less than 4 days, contact our Monitor department at [phone].  If your monitor becomes loose or falls off after 4 days call [employer] at [phone] for  suggestions on securing your monitor   Important Information About Sugar

## 2021-09-26 ENCOUNTER — Other Ambulatory Visit: Payer: Self-pay

## 2021-09-26 DIAGNOSIS — E785 Hyperlipidemia, unspecified: Secondary | ICD-10-CM | POA: Diagnosis not present

## 2021-09-26 LAB — HEPATIC FUNCTION PANEL
ALT: 29 IU/L (ref 0–44)
AST: 22 IU/L (ref 0–40)
Albumin: 4.5 g/dL (ref 3.7–4.7)
Alkaline Phosphatase: 139 IU/L — ABNORMAL HIGH (ref 44–121)
Bilirubin Total: 1.2 mg/dL (ref 0.0–1.2)
Bilirubin, Direct: 0.29 mg/dL (ref 0.00–0.40)
Total Protein: 6.7 g/dL (ref 6.0–8.5)

## 2021-09-26 LAB — LIPID PANEL
Chol/HDL Ratio: 1.9 ratio (ref 0.0–5.0)
Cholesterol, Total: 110 mg/dL (ref 100–199)
HDL: 58 mg/dL (ref >39)
LDL Chol Calc (NIH): 37 mg/dL (ref 0–99)
Triglycerides: 70 mg/dL (ref 0–149)
VLDL Cholesterol Cal: 15 mg/dL (ref 5–40)

## 2021-09-27 ENCOUNTER — Telehealth: Payer: Self-pay | Admitting: Nurse Practitioner

## 2021-09-27 DIAGNOSIS — R002 Palpitations: Secondary | ICD-10-CM | POA: Diagnosis not present

## 2021-09-27 NOTE — Telephone Encounter (Signed)
Went over labs with Pt from yesterday. Will send copies to PCP via Epic.

## 2021-09-27 NOTE — Telephone Encounter (Signed)
Pt returning call. Requesting call.

## 2021-10-12 ENCOUNTER — Other Ambulatory Visit: Payer: Self-pay | Admitting: Family Medicine

## 2021-10-17 DIAGNOSIS — R002 Palpitations: Secondary | ICD-10-CM | POA: Diagnosis not present

## 2021-10-20 ENCOUNTER — Encounter: Payer: Self-pay | Admitting: Gastroenterology

## 2021-10-20 ENCOUNTER — Ambulatory Visit: Payer: Medicare Other | Admitting: Gastroenterology

## 2021-10-20 VITALS — HR 63 | Ht 67.0 in | Wt 186.0 lb

## 2021-10-20 DIAGNOSIS — R079 Chest pain, unspecified: Secondary | ICD-10-CM | POA: Diagnosis not present

## 2021-10-20 DIAGNOSIS — R142 Eructation: Secondary | ICD-10-CM

## 2021-10-20 NOTE — Patient Instructions (Signed)
You have been scheduled for an endoscopy. Please follow written instructions given to you at your visit today. If you use inhalers (even only as needed), please bring them with you on the day of your procedure.  Please take over the counter Gaviscon four times a day as needed for gas and bloating.   Start a low gas diet.   The  GI providers would like to encourage you to use [facility] to communicate with providers for non-urgent requests or questions.  Due to long hold times on the telephone, sending your provider a message by [facility] may be a faster and more efficient way to get a response.  Please allow 48 business hours for a response.  Please remember that this is for non-urgent requests.   Due to recent changes in healthcare laws, you may see the results of your imaging and laboratory studies on MyChart before your provider has had a chance to review them.  We understand that in some cases there may be results that are confusing or concerning to you. Not all laboratory results come back in the same time frame and the provider may be waiting for multiple results in order to interpret others.  Please give [place] 48 hours in order for your provider to thoroughly review all the results before contacting the office for clarification of your results.   Thank you for choosing me and [facility].  Pricilla Riffle. Dagoberto Ligas., MD., Marval Regal

## 2021-10-20 NOTE — Progress Notes (Signed)
    Assessment     Chest pain, back pain likely musculoskeletal, gas, belching. R/O GERD, esophagitis. ulcer   Recommendations    Schedule EGD. The risks (including bleeding, perforation, infection, missed lesions, medication reactions and possible hospitalization or surgery if complications occur), benefits, and alternatives to endoscopy with possible biopsy and possible dilation were discussed with the patient and they consent to proceed.   Gaviscon 4 times daily as needed.  Low gas diet.   HPI    This is a 76 year old male with frequent upper chest pain, back pain, right scapular pain.  He was evaluated in the ED on May 22.  He has a history of gastric ulcer and has been treated for GERD.  He has been maintained on pantoprazole daily and famotidine was recently added and he has not noted improvement in symptoms.  He notes frequent belching which occasionally helps his symptoms.  He states, movement and rotation of his shoulders and neck sometimes helps his symptoms as well.  CT AP in January 2023 showed cholelithiasis, suspected internal iliac stenoses, an enlarged prostate and aortic atherosclerosis. Abd [place] in May 2023 showed hepatic steatosis, no gallstones, normal CBD diameter. Denies weight loss, abdominal pain, constipation, diarrhea, change in stool caliber, melena, hematochezia, nausea, vomiting, dysphagia.    Labs / Imaging       Latest Ref Rng & Units 09/26/2021    9:56 AM 09/12/2021    3:26 PM 05/10/2021    2:36 PM  Hepatic Function  Total Protein 6.0 - 8.5 g/dL 6.7  6.7  7.3   Albumin 3.7 - 4.7 g/dL 4.5  3.8  4.5   AST 0 - 40 IU/L $Remov'22  24  23   'hhjdTh$ ALT 0 - 44 IU/L 29  31  36   Alk Phosphatase 44 - 121 IU/L 139  113  127   Total Bilirubin 0.0 - 1.2 mg/dL 1.2  1.4  1.5   Bilirubin, Direct 0.00 - 0.40 mg/dL 0.29  0.2        Current Medications, Allergies, Past Medical History, Past Surgical History, Family History and Social History were reviewed in [employer] record.   Physical Exam: General: Well developed, well nourished, no acute distress Head: Normocephalic and atraumatic Eyes: Sclerae anicteric, EOMI Ears: Normal auditory acuity Mouth: Not examined Lungs: Clear throughout to auscultation Heart: Regular rate and rhythm; no murmurs, rubs or bruits Abdomen: Soft, non tender and non distended. No masses, hepatosplenomegaly or hernias noted. Normal Bowel sounds Rectal: Not done Musculoskeletal: Symmetrical with no gross deformities  Pulses:  Normal pulses noted Extremities: No clubbing, cyanosis, edema or deformities noted Neurological: Alert oriented x 4, grossly nonfocal Psychological:  Alert and cooperative. Normal mood and affect   Mahlia Fernando T. Fuller Plan, MD 10/20/2021, 9:33 AM

## 2021-10-20 NOTE — Addendum Note (Signed)
Addended by: Dorisann Frames L on: 10/20/2021 03:53 PM   Modules accepted: Orders

## 2021-10-31 ENCOUNTER — Other Ambulatory Visit: Payer: Self-pay | Admitting: Family Medicine

## 2021-11-09 ENCOUNTER — Other Ambulatory Visit: Payer: Self-pay | Admitting: Family Medicine

## 2021-11-15 ENCOUNTER — Encounter: Payer: Self-pay | Admitting: Gastroenterology

## 2021-11-15 ENCOUNTER — Ambulatory Visit (AMBULATORY_SURGERY_CENTER): Payer: Medicare Other | Admitting: Gastroenterology

## 2021-11-15 VITALS — BP 131/72 | HR 63 | Temp 97.8°F | Resp 16 | Ht 67.0 in | Wt 186.0 lb

## 2021-11-15 DIAGNOSIS — R079 Chest pain, unspecified: Secondary | ICD-10-CM | POA: Diagnosis not present

## 2021-11-15 DIAGNOSIS — K31A0 Gastric intestinal metaplasia, unspecified: Secondary | ICD-10-CM | POA: Diagnosis not present

## 2021-11-15 DIAGNOSIS — K2951 Unspecified chronic gastritis with bleeding: Secondary | ICD-10-CM | POA: Diagnosis not present

## 2021-11-15 DIAGNOSIS — K259 Gastric ulcer, unspecified as acute or chronic, without hemorrhage or perforation: Secondary | ICD-10-CM

## 2021-11-15 DIAGNOSIS — R0789 Other chest pain: Secondary | ICD-10-CM | POA: Diagnosis not present

## 2021-11-15 DIAGNOSIS — K2970 Gastritis, unspecified, without bleeding: Secondary | ICD-10-CM | POA: Diagnosis not present

## 2021-11-15 MED ORDER — SODIUM CHLORIDE 0.9 % IV SOLN: 500.0000 mL | Freq: Once | INTRAVENOUS | Status: DC

## 2021-11-15 NOTE — Progress Notes (Signed)
See 10/20/2021 H&P, no changes

## 2021-11-15 NOTE — Patient Instructions (Signed)
Resume previous diet and medications. Awaiting pathology results.  YOU HAD AN ENDOSCOPIC PROCEDURE TODAY AT [facility]:   Refer to the procedure report that was given to you for any specific questions about what was found during the examination.  If the procedure report does not answer your questions, please call your gastroenterologist to clarify.  If you requested that your care partner not be given the details of your procedure findings, then the procedure report has been included in a sealed envelope for you to review at your convenience later.  YOU SHOULD EXPECT: Some feelings of bloating in the abdomen. Passage of more gas than usual.  Walking can help get rid of the air that was put into your GI tract during the procedure and reduce the bloating. If you had a lower endoscopy (such as a colonoscopy or flexible sigmoidoscopy) you may notice spotting of blood in your stool or on the toilet paper. If you underwent a bowel prep for your procedure, you may not have a normal bowel movement for a few days.  Please Note:  You might notice some irritation and congestion in your nose or some drainage.  This is from the oxygen used during your procedure.  There is no need for concern and it should clear up in a day or so.  SYMPTOMS TO REPORT IMMEDIATELY:  Following upper endoscopy (EGD)  Vomiting of blood or coffee ground material  New chest pain or pain under the shoulder blades  Painful or persistently difficult swallowing  New shortness of breath  Fever of 100F or higher  Black, tarry-looking stools  For urgent or emergent issues, a gastroenterologist can be reached at any hour by calling [phone]. Do not use MyChart messaging for urgent concerns.    DIET:  We do recommend a small meal at first, but then you may proceed to your regular diet.  Drink plenty of fluids but you should avoid alcoholic beverages for 24 hours.  ACTIVITY:  You should plan to take it easy for the  rest of today and you should NOT DRIVE or use heavy machinery until tomorrow (because of the sedation medicines used during the test).    FOLLOW UP: Our staff will call the number listed on your records the next business day following your procedure.  We will call around 7:15- 8:00 am to check on you and address any questions or concerns that you may have regarding the information given to you following your procedure. If we do not reach you, we will leave a message.  If you develop any symptoms (ie: fever, flu-like symptoms, shortness of breath, cough etc.) before then, please call [phone].  If you test positive for Covid 19 in the 2 weeks post procedure, please call and report this information to [place].    If any biopsies were taken you will be contacted by phone or by letter within the next 1-3 weeks.  Please call us at [phone] if you have not heard about the biopsies in 3 weeks.    SIGNATURES/CONFIDENTIALITY: You and/or your care partner have signed paperwork which will be entered into your electronic medical record.  These signatures attest to the fact that that the information above on your After Visit Summary has been reviewed and is understood.  Full responsibility of the confidentiality of this discharge information lies with you and/or your care-partner.

## 2021-11-15 NOTE — Progress Notes (Signed)
Pt in recovery with monitors in place, VSS. Report given to receiving RN. Bite guard was placed with pt awake to ensure comfort. No dental or soft tissue damage noted. 

## 2021-11-15 NOTE — Op Note (Signed)
[facility] Patient Name: Jerry Farrell Procedure Date: 11/15/2021 7:34 AM MRN: [id number] Endoscopist: Ladene Artist , MD Age: 76 Referring MD:  Date of Birth: 10-Aug-1945 Gender: Male Account #: [id number] Procedure:                Upper GI endoscopy Indications:              Unexplained chest pain Medicines:                Monitored Anesthesia Care Procedure:                Pre-Anesthesia Assessment:                           - Prior to the procedure, a History and Physical                            was performed, and patient medications and                            allergies were reviewed. The patient's tolerance of                            previous anesthesia was also reviewed. The risks                            and benefits of the procedure and the sedation                            options and risks were discussed with the patient.                            All questions were answered, and informed consent                            was obtained. Prior Anticoagulants: The patient has                            taken no previous anticoagulant or antiplatelet                            agents. ASA Grade Assessment: III - A patient with                            severe systemic disease. After reviewing the risks                            and benefits, the patient was deemed in                            satisfactory condition to undergo the procedure.                           After obtaining informed consent, the endoscope was  passed under direct vision. Throughout the                            procedure, the patient's blood pressure, pulse, and                            oxygen saturations were monitored continuously. The                            GIF [id number] #[id number] was introduced through the                            mouth, and advanced to the second part of duodenum.                            The upper GI endoscopy was  accomplished without                            difficulty. The patient tolerated the procedure                            well. Scope In: Scope Out: Findings:                 The examined esophagus was normal.                           Evidence of a patent Billroth I gastroduodenostomy                            was found. A gastric pouch was found. The                            gastroduodenal anastomosis was characterized by                            healthy appearing mucosa. This was traversed.                           Striped moderately erythematous mucosa without                            bleeding was found in the entire examined stomach.                            Biopsies were taken with a cold forceps for                            histology.                           The exam of the stomach was otherwise normal except                            for a clip on the greater curvature.  The examined duodenum was normal. Complications:            No immediate complications. Estimated Blood Loss:     Estimated blood loss was minimal. Impression:               - Normal esophagus.                           - Patent Billroth I gastroduodenostomy was found,                            characterized by healthy appearing mucosa.                           - Greater curvature clip.                           - Erythematous mucosa in the stomach. Biopsied.                           - Normal examined duodenum. Recommendation:           - Patient has a contact number available for                            emergencies. The signs and symptoms of potential                            delayed complications were discussed with the                            patient. Return to normal activities tomorrow.                            Written discharge instructions were provided to the                            patient.                           - Resume previous diet.                            - Continue present medications.                           - Await pathology results.                           - No GI cause for chest pain. Return to PCP. Ladene Artist, MD 11/15/2021 8:18:31 AM This report has been signed electronically.

## 2021-11-15 NOTE — Progress Notes (Signed)
Called to room to assist during endoscopic procedure.  Patient ID and intended procedure confirmed with present staff. Received instructions for my participation in the procedure from the performing physician.  

## 2021-11-16 ENCOUNTER — Telehealth: Payer: Self-pay

## 2021-11-16 NOTE — Telephone Encounter (Signed)
Left message

## 2021-11-20 ENCOUNTER — Inpatient Hospital Stay (HOSPITAL_COMMUNITY)
Admission: EM | Admit: 2021-11-20 | Discharge: 2021-11-24 | DRG: 919 | Disposition: A | Discharge status: Home / Self Care | Payer: Medicare Other | Attending: Internal Medicine | Admitting: Internal Medicine

## 2021-11-20 ENCOUNTER — Encounter (HOSPITAL_COMMUNITY): Payer: Self-pay

## 2021-11-20 ENCOUNTER — Other Ambulatory Visit: Payer: Self-pay

## 2021-11-20 ENCOUNTER — Emergency Department (HOSPITAL_COMMUNITY): Payer: Medicare Other

## 2021-11-20 DIAGNOSIS — M545 Low back pain, unspecified: Secondary | ICD-10-CM | POA: Diagnosis not present

## 2021-11-20 DIAGNOSIS — I959 Hypotension, unspecified: Secondary | ICD-10-CM | POA: Diagnosis present

## 2021-11-20 DIAGNOSIS — Z8619 Personal history of other infectious and parasitic diseases: Secondary | ICD-10-CM | POA: Diagnosis not present

## 2021-11-20 DIAGNOSIS — K922 Gastrointestinal hemorrhage, unspecified: Secondary | ICD-10-CM | POA: Diagnosis present

## 2021-11-20 DIAGNOSIS — E785 Hyperlipidemia, unspecified: Secondary | ICD-10-CM | POA: Diagnosis not present

## 2021-11-20 DIAGNOSIS — Z961 Presence of intraocular lens: Secondary | ICD-10-CM | POA: Diagnosis present

## 2021-11-20 DIAGNOSIS — Z7982 Long term (current) use of aspirin: Secondary | ICD-10-CM

## 2021-11-20 DIAGNOSIS — I10 Essential (primary) hypertension: Secondary | ICD-10-CM | POA: Diagnosis not present

## 2021-11-20 DIAGNOSIS — Z79899 Other long term (current) drug therapy: Secondary | ICD-10-CM

## 2021-11-20 DIAGNOSIS — R55 Syncope and collapse: Secondary | ICD-10-CM

## 2021-11-20 DIAGNOSIS — N19 Unspecified kidney failure: Secondary | ICD-10-CM | POA: Diagnosis not present

## 2021-11-20 DIAGNOSIS — D62 Acute posthemorrhagic anemia: Secondary | ICD-10-CM | POA: Diagnosis present

## 2021-11-20 DIAGNOSIS — K921 Melena: Secondary | ICD-10-CM | POA: Diagnosis not present

## 2021-11-20 DIAGNOSIS — Z86010 Personal history of colonic polyps: Secondary | ICD-10-CM | POA: Diagnosis not present

## 2021-11-20 DIAGNOSIS — Z808 Family history of malignant neoplasm of other organs or systems: Secondary | ICD-10-CM | POA: Diagnosis not present

## 2021-11-20 DIAGNOSIS — R9431 Abnormal electrocardiogram [ECG] [EKG]: Secondary | ICD-10-CM

## 2021-11-20 DIAGNOSIS — Z9842 Cataract extraction status, left eye: Secondary | ICD-10-CM | POA: Diagnosis not present

## 2021-11-20 DIAGNOSIS — Z980 Intestinal bypass and anastomosis status: Secondary | ICD-10-CM | POA: Diagnosis not present

## 2021-11-20 DIAGNOSIS — G8929 Other chronic pain: Secondary | ICD-10-CM | POA: Diagnosis present

## 2021-11-20 DIAGNOSIS — R42 Dizziness and giddiness: Secondary | ICD-10-CM

## 2021-11-20 DIAGNOSIS — Z8249 Family history of ischemic heart disease and other diseases of the circulatory system: Secondary | ICD-10-CM

## 2021-11-20 DIAGNOSIS — K91840 Postprocedural hemorrhage and hematoma of a digestive system organ or structure following a digestive system procedure: Secondary | ICD-10-CM | POA: Diagnosis not present

## 2021-11-20 DIAGNOSIS — E860 Dehydration: Secondary | ICD-10-CM | POA: Diagnosis not present

## 2021-11-20 DIAGNOSIS — Z8719 Personal history of other diseases of the digestive system: Secondary | ICD-10-CM | POA: Diagnosis not present

## 2021-11-20 DIAGNOSIS — I2510 Atherosclerotic heart disease of native coronary artery without angina pectoris: Secondary | ICD-10-CM | POA: Diagnosis present

## 2021-11-20 DIAGNOSIS — K254 Chronic or unspecified gastric ulcer with hemorrhage: Secondary | ICD-10-CM | POA: Diagnosis not present

## 2021-11-20 DIAGNOSIS — Z903 Acquired absence of stomach [part of]: Secondary | ICD-10-CM

## 2021-11-20 DIAGNOSIS — R079 Chest pain, unspecified: Secondary | ICD-10-CM | POA: Diagnosis not present

## 2021-11-20 DIAGNOSIS — K219 Gastro-esophageal reflux disease without esophagitis: Secondary | ICD-10-CM | POA: Diagnosis present

## 2021-11-20 DIAGNOSIS — N400 Enlarged prostate without lower urinary tract symptoms: Secondary | ICD-10-CM | POA: Diagnosis present

## 2021-11-20 DIAGNOSIS — Y838 Other surgical procedures as the cause of abnormal reaction of the patient, or of later complication, without mention of misadventure at the time of the procedure: Secondary | ICD-10-CM | POA: Diagnosis present

## 2021-11-20 DIAGNOSIS — Z951 Presence of aortocoronary bypass graft: Secondary | ICD-10-CM | POA: Diagnosis not present

## 2021-11-20 DIAGNOSIS — Q399 Congenital malformation of esophagus, unspecified: Secondary | ICD-10-CM | POA: Diagnosis not present

## 2021-11-20 DIAGNOSIS — Z800 Family history of malignant neoplasm of digestive organs: Secondary | ICD-10-CM | POA: Diagnosis not present

## 2021-11-20 DIAGNOSIS — D509 Iron deficiency anemia, unspecified: Secondary | ICD-10-CM | POA: Diagnosis not present

## 2021-11-20 LAB — URINALYSIS, ROUTINE W REFLEX MICROSCOPIC
Bilirubin Urine: NEGATIVE
Glucose, UA: NEGATIVE mg/dL
Hgb urine dipstick: NEGATIVE
Ketones, ur: 5 mg/dL — AB
Leukocytes,Ua: NEGATIVE
Nitrite: NEGATIVE
Protein, ur: NEGATIVE mg/dL
Specific Gravity, Urine: 1.009 (ref 1.005–1.030)
pH: 7 (ref 5.0–8.0)

## 2021-11-20 LAB — COMPREHENSIVE METABOLIC PANEL
ALT: 34 U/L (ref 0–44)
AST: 20 U/L (ref 15–41)
Albumin: 3.7 g/dL (ref 3.5–5.0)
Alkaline Phosphatase: 84 U/L (ref 38–126)
Anion gap: 8 (ref 5–15)
BUN: 26 mg/dL — ABNORMAL HIGH (ref 8–23)
CO2: 25 mmol/L (ref 22–32)
Calcium: 8.7 mg/dL — ABNORMAL LOW (ref 8.9–10.3)
Chloride: 107 mmol/L (ref 98–111)
Creatinine, Ser: 1.03 mg/dL (ref 0.61–1.24)
GFR, Estimated: >60 mL/min (ref >60)
Glucose, Bld: 101 mg/dL — ABNORMAL HIGH (ref 70–99)
Potassium: 4.3 mmol/L (ref 3.5–5.1)
Sodium: 140 mmol/L (ref 135–145)
Total Bilirubin: 1.3 mg/dL — ABNORMAL HIGH (ref 0.3–1.2)
Total Protein: 6.5 g/dL (ref 6.5–8.1)

## 2021-11-20 LAB — CBC
HCT: 40.5 % (ref 39.0–52.0)
Hemoglobin: 13.3 g/dL (ref 13.0–17.0)
MCH: 29.4 pg (ref 26.0–34.0)
MCHC: 32.8 g/dL (ref 30.0–36.0)
MCV: 89.4 fL (ref 80.0–100.0)
Platelets: 135 10*3/uL — ABNORMAL LOW (ref 150–400)
RBC: 4.53 MIL/uL (ref 4.22–5.81)
RDW: 13.1 % (ref 11.5–15.5)
WBC: 9.5 10*3/uL (ref 4.0–10.5)
nRBC: 0 % (ref 0.0–0.2)

## 2021-11-20 LAB — TROPONIN I (HIGH SENSITIVITY)
Troponin I (High Sensitivity): 4 ng/L (ref <18)
Troponin I (High Sensitivity): 4 ng/L (ref <18)

## 2021-11-20 MED ORDER — FAMOTIDINE 20 MG PO TABS
20.0000 mg | ORAL_TABLET | Freq: Two times a day (BID) | ORAL | Status: DC
Start: 2021-11-20 — End: 2021-11-24
  Administered 2021-11-20 – 2021-11-24 (×8): 20 mg via ORAL
  Filled 2021-11-20 (×8): qty 1

## 2021-11-20 MED ORDER — LACTATED RINGERS IV SOLN: INTRAVENOUS | Status: DC

## 2021-11-20 MED ORDER — CARVEDILOL 3.125 MG PO TABS
3.1250 mg | ORAL_TABLET | Freq: Two times a day (BID) | ORAL | Status: DC
  Administered 2021-11-20: 3.125 mg via ORAL
  Filled 2021-11-20: qty 1

## 2021-11-20 MED ORDER — LACTATED RINGERS IV SOLN
INTRAVENOUS | Status: AC
Start: 2021-11-20 — End: 2021-11-21

## 2021-11-20 MED ORDER — IRBESARTAN 300 MG PO TABS
300.0000 mg | ORAL_TABLET | Freq: Every day | ORAL | Status: DC
  Filled 2021-11-20: qty 1

## 2021-11-20 MED ORDER — ATORVASTATIN CALCIUM 40 MG PO TABS
40.0000 mg | ORAL_TABLET | Freq: Every day | ORAL | Status: DC
Start: 2021-11-20 — End: 2021-11-24
  Administered 2021-11-20 – 2021-11-23 (×4): 40 mg via ORAL
  Filled 2021-11-20 (×4): qty 1

## 2021-11-20 MED ORDER — ACETAMINOPHEN 325 MG PO TABS: 650.0000 mg | ORAL_TABLET | Freq: Four times a day (QID) | ORAL | Status: DC | PRN

## 2021-11-20 MED ORDER — ASPIRIN 81 MG PO TBEC
81.0000 mg | DELAYED_RELEASE_TABLET | Freq: Every day | ORAL | Status: DC
  Administered 2021-11-21: 81 mg via ORAL
  Filled 2021-11-20: qty 1

## 2021-11-20 MED ORDER — PANTOPRAZOLE SODIUM 40 MG PO TBEC
40.0000 mg | DELAYED_RELEASE_TABLET | Freq: Every day | ORAL | Status: DC
  Administered 2021-11-21: 40 mg via ORAL
  Filled 2021-11-20: qty 1

## 2021-11-20 MED ORDER — ACETAMINOPHEN 650 MG RE SUPP: 650.0000 mg | Freq: Four times a day (QID) | RECTAL | Status: DC | PRN

## 2021-11-20 MED ORDER — LACTATED RINGERS IV BOLUS
1000.0000 mL | Freq: Once | INTRAVENOUS | Status: AC
  Administered 2021-11-20: 1000 mL via INTRAVENOUS

## 2021-11-20 NOTE — ED Triage Notes (Signed)
Patient reports that  he began "feeling bad while at church and felt like fainting while eating" this afternoon.

## 2021-11-20 NOTE — ED Provider Triage Note (Signed)
Emergency Medicine Provider Triage Evaluation Note  Jerry Farrell , a 76 y.o. male  was evaluated in triage.  Pt complains of lightheadedness and near syncope while at church earlier this morning. Has previously been having chest discomfort, and some still today as well. Felt nauseous, no vomiting. Checked BP before taking morning meds and BP was lower than normal. Says symptoms have mostly resolved but BP still lower than normal.   Review of Systems  Positive: As above Negative: Syncope, vomiting  Physical Exam  BP (!) 112/51 (BP Location: Right Arm)   Pulse 71   Temp 98 F (36.7 C) (Oral)   Resp 16   Ht '5\' 7"'$  (1.702 m)   Wt 83.9 kg   SpO2 98%   BMI 28.98 kg/m  Gen:   Awake, no distress   Resp:  Normal effort  MSK:   Moves extremities without difficulty  Other:    Medical Decision Making  Medically screening exam initiated at 4:58 PM.  Appropriate orders placed.  Jerry Farrell was informed that the remainder of the evaluation will be completed by another provider, this initial triage assessment does not replace that evaluation, and the importance of remaining in the ED until their evaluation is complete.     Jerry Goodridge T, PA-C 11/20/21 1659

## 2021-11-20 NOTE — H&P (Signed)
History and Physical    PLEASE NOTE THAT DRAGON DICTATION SOFTWARE WAS USED IN THE CONSTRUCTION OF THIS NOTE.   Jerry Farrell WUX:[id number] DOB: 12/11/1945 DOA: 11/20/2021  PCP: Judy Pimple, MD  Patient coming from: home   I have personally briefly reviewed patient's old medical records in [facility]  Chief Complaint: Episode of lightheadedness  HPI: Jerry Farrell is a 76 y.o. male with medical history significant for coronary disease status post CABG x1 in January, BPH, hypertension, hyperlipidemia who is admitted to [facility] on 11/20/2021 with single episode of presyncope after presenting from home to [facility] ED complaining of episode of lightheadedness.   The patient reports a single episode of lightheadedness, dizziness, associated with diaphoresis and nausea in the absence of any vomiting, with this episode occurring around 1530 on 11/20/2021.  Episode occurred at a local restaurant when the patient attempted to stand from a seated position before eating lunch.  He notes that the symptoms lasted for approximately 10 minutes before spontaneously resolving without any subsequent recurrence.  He notes that this episode was also associated with some sharp left-sided chest pain radiating to the left neck, conveying that this pain started after the onset of the dizziness/lightheadedness.  Pain was nonexertional, nonpleuritic, and spontaneously resolved in the absence of any interval sublingual nitroglycerin.  He denies any ensuing chest discomfort.   Symptoms not associate with any tonic-clonic activity nor any acute focal neurologic deficits.   He conveys that he has been experiencing intermittent chest discomfort over the course the last several months, for which he is been following with his outpatient cardiologist Dr. Gery Pray.  Component of evaluation for this chest discomfort included outpatient heart monitor, which she wore in June 2023.  Additionally, for further evaluation  of this chest discomfort, the patient underwent EGD on 11/15/2021, which showed evidence of mild erythema associated with the gastric mucosa, but without overt gastritis, and no evidence of peptic ulcer disease.  Biopsy taken of the gastric mucosa, with results currently pending.  Otherwise, EGD showed no evidence of acute process.  Per chart review, it appears that the patient's most recent heart cath occurred in August 2022 at [facility].  The patient reports that he has been spending additional time outside, working around his house, including in the recent ambient temperatures up to the mid 90s.  Denies any recent vomiting, diarrhea, melena, or hematochezia.   Medical history notable for essential hypertension, for which his outpatient antihypertensive regimen includes valsartan, Norvasc, Coreg, doxazosin, hydralazine.  In addition to doxazosin, he is also on tamsulosin for history of BPH.  He reports that the most recent modification to his outpatient antihypertensive regimen occurred in 2019.    ED Course:  Vital signs in the ED were notable for the following: Afebrile; heart rate 67-70; blood pressure 112/51 - 152/74; respiratory 1518, oxygen saturation 98 to 99% on room air.  Labs were notable for the following: CMP notable for the following: Potassium 4.3, bicarbonate 23, creatinine 1.03 relative to most recent prior serum creatinine data point of 0.95 on 09/12/2021, BUN agree ratio 25.2, glucose 101, liver enzymes within normal limits.  High-sensitivity troponin I x2 values are without before.  CBC notable for white blood cell count 9500, hemoglobin 13.3.  Urinalysis showed no white blood cells.  Imaging and additional notable ED work-up: EKG, in comparison to most recent prior from 09/13/2021 shows sinus rhythm with single PVC, heart rate 64, normal intervals, nonspecific T wave inversion in  lead I, which appears new from most recent prior EKG, while also showing nonspecific T wave inversion in  V4/V5, which appears unchanged from most recent prior EKG, will demonstrate no evidence of ST changes, including no evidence of ST elevation.  Chest x-ray showed no evidence of acute cardiopulmonary process.  While in the ED, the following were administered: LR bolus x1 L.  Subsequently, the patient was admitted for overnight observation for further evaluation management of single episode of presyncope.    Review of Systems: As per HPI otherwise 10 point review of systems negative.   Past Medical History:  Diagnosis Date   Adenomatous colon polyp    BPH (benign prostatic hyperplasia)    CAD (coronary artery disease)    cardiolite ok 3/05, stress test- low risk study 11/06   Chronic low back pain    with radiculopathy   ED (erectile dysfunction)    Gallstones    abd [place]- stable hamangioma, gallbladder sludge and tiny stones 09/2003   Gastritis    GERD (gastroesophageal reflux disease)    H pylori ulcer    Hyperlipidemia    Hypertension    Transfusion history     Past Surgical History:  Procedure Laterality Date   CATARACT EXTRACTION W/PHACO  03/13/2012   Procedure: CATARACT EXTRACTION PHACO AND INTRAOCULAR LENS PLACEMENT (IOC);  Surgeon: Shade Flood, MD;  Location: [facility] OR;  Service: Ophthalmology;  Laterality: Left;   CORONARY ARTERY BYPASS GRAFT  1998   x 1  [facility]   ESOPHAGOGASTRODUODENOSCOPY (EGD) WITH PROPOFOL N/A 06/07/2016   Procedure: ESOPHAGOGASTRODUODENOSCOPY (EGD) WITH PROPOFOL;  Surgeon: Napoleon Form, MD;  Location: [facility] ENDOSCOPY;  Service: Endoscopy;  Laterality: N/A;   GASTRECTOMY     partial   LEFT HEART CATH AND CORS/GRAFTS ANGIOGRAPHY N/A 12/02/2020   Procedure: LEFT HEART CATH AND CORS/GRAFTS ANGIOGRAPHY;  Surgeon: Runell Gess, MD;  Location: [facility] INVASIVE CV LAB;  Service: Cardiovascular;  Laterality: N/A;   MEMBRANE PEEL  03/13/2012   Procedure: MEMBRANE PEEL;  Surgeon: Shade Flood, MD;  Location: [facility] OR;  Service: Ophthalmology;  Laterality:  Left;   PARS PLANA VITRECTOMY  03/13/2012   Procedure: PARS PLANA VITRECTOMY WITH 23 GAUGE;  Surgeon: Shade Flood, MD;  Location: [facility] OR;  Service: Ophthalmology;  Laterality: Left;    Social History:  reports that he has never smoked. He has never used smokeless tobacco. He reports that he does not drink alcohol and does not use drugs.   No Known Allergies  Family History  Problem Relation Age of Onset   Hypertension Mother    Throat cancer Brother        pt feels alcohol related   Colon cancer Brother        1/2 brother   Stomach cancer Neg Hx    Pancreatic cancer Neg Hx     Family history reviewed and not pertinent    Prior to Admission medications   Medication Sig Start Date End Date Taking? Authorizing Provider  acetaminophen (TYLENOL) 500 MG tablet Take 1,000 mg by mouth every 6 (six) hours as needed for mild pain or headache.   Yes [provider]  aluminum hydroxide-magnesium carbonate (GAVISCON) 95-358 MG/15ML SUSP Take 15 mLs by mouth as needed for indigestion.   Yes [provider]  amLODipine (NORVASC) 5 MG tablet Take 0.5 tablets (2.5 mg total) by mouth 2 (two) times daily. 01/03/21  Yes Runell Gess, MD  aspirin EC 81 MG tablet Take 1 tablet (  81 mg total) by mouth daily. 06/02/16  Yes Azalee Course, PA  atorvastatin (LIPITOR) 40 MG tablet Take 1 tablet (40 mg total) by mouth daily. Patient taking differently: Take 40 mg by mouth at bedtime. 02/02/21  Yes Runell Gess, MD  carvedilol (COREG) 3.125 MG tablet TAKE 1 TABLET BY MOUTH 2 TIMES DAILY WITH A MEAL. Patient taking differently: Take 3.125 mg by mouth 2 (two) times daily with a meal. 11/16/20  Yes Runell Gess, MD  doxazosin (CARDURA) 4 MG tablet TAKE 1 TABLET EVERY DAY Patient taking differently: Take 4 mg by mouth daily. 07/26/21  Yes Runell Gess, MD  famotidine (PEPCID) 20 MG tablet TAKE 1 TABLET BY MOUTH TWICE A DAY Patient taking differently: Take 20 mg by mouth 2 (two) times  daily. 11/09/21  Yes Tower, Audrie Gallus, MD  hydrALAZINE (APRESOLINE) 25 MG tablet Take 1 tablet (25 mg total) by mouth 2 (two) times daily. 01/19/21 11/20/21 Yes Runell Gess, MD  pantoprazole (PROTONIX) 40 MG tablet TAKE 1 TABLET BY MOUTH EVERY DAY Patient taking differently: Take 40 mg by mouth daily before breakfast. 09/09/21  Yes Esterwood, Amy S, PA-C  Phenylephrine-DM-GG (ROBAFEN CF MULTI-SYMPTOM COLD) 5-10-100 MG/5ML LIQD Take 5 mLs by mouth daily as needed (cough).   Yes [provider]  SYSTANE BALANCE 0.6 % SOLN Place 1 drop into both eyes 3 (three) times daily as needed (for dryness).   Yes [provider]  tamsulosin (FLOMAX) 0.4 MG CAPS capsule TAKE 1 CAPSULES BY MOUTH ONCE DAILY. Patient taking differently: Take 0.4 mg by mouth at bedtime. 10/31/21  Yes Tower, Audrie Gallus, MD  valsartan (DIOVAN) 320 MG tablet TAKE 1 TABLET BY MOUTH EVERY DAY Patient taking differently: Take 320 mg by mouth daily. 09/05/21  Yes Runell Gess, MD     Objective    Physical Exam: Vitals:   11/20/21 1930 11/20/21 2022 11/20/21 2040 11/20/21 2052  BP: 140/67 132/71 (!) 152/74 (!) 152/67  Pulse: 67 67 69 66  Resp: 18 15 (!) 25 16  Temp:    97.6 F (36.4 C)  TempSrc:      SpO2: 99% 98% 98% 99%  Weight:      Height:        General: appears to be stated age; alert, oriented Skin: warm, dry, no rash Head:  AT/[place] Mouth:  Oral mucosa membranes appear dry, normal dentition Neck: supple; trachea midline Heart:  RRR; did not appreciate any M/R/G Lungs: CTAB, did not appreciate any wheezes, rales, or rhonchi Abdomen: + BS; soft, ND, NT Vascular: 2+ pedal pulses b/l; 2+ radial pulses b/l Extremities: no peripheral edema, no muscle wasting Neuro: strength and sensation intact in upper and lower extremities b/l      Labs on Admission: I have personally reviewed following labs and imaging studies  CBC: Recent Labs  Lab 11/20/21 1707  WBC 9.5  HGB 13.3  HCT 40.5  MCV 89.4   PLT 135*   Basic Metabolic Panel: Recent Labs  Lab 11/20/21 1707  NA 140  K 4.3  CL 107  CO2 25  GLUCOSE 101*  BUN 26*  CREATININE 1.03  CALCIUM 8.7*   GFR: Estimated Creatinine Clearance: 61.2 mL/min (by C-G formula based on SCr of 1.03 mg/dL). Liver Function Tests: Recent Labs  Lab 11/20/21 1707  AST 20  ALT 34  ALKPHOS 84  BILITOT 1.3*  PROT 6.5  ALBUMIN 3.7   No results for input(s): "LIPASE", "AMYLASE" in the last 168  hours. No results for input(s): "AMMONIA" in the last 168 hours. Coagulation Profile: No results for input(s): "INR", "PROTIME" in the last 168 hours. Cardiac Enzymes: No results for input(s): "CKTOTAL", "CKMB", "CKMBINDEX", "TROPONINI" in the last 168 hours. BNP (last 3 results) No results for input(s): "PROBNP" in the last 8760 hours. HbA1C: No results for input(s): "HGBA1C" in the last 72 hours. CBG: No results for input(s): "GLUCAP" in the last 168 hours. Lipid Profile: No results for input(s): "CHOL", "HDL", "LDLCALC", "TRIG", "CHOLHDL", "LDLDIRECT" in the last 72 hours. Thyroid Function Tests: No results for input(s): "TSH", "T4TOTAL", "FREET4", "T3FREE", "THYROIDAB" in the last 72 hours. Anemia Panel: No results for input(s): "VITAMINB12", "FOLATE", "FERRITIN", "TIBC", "IRON", "RETICCTPCT" in the last 72 hours. Urine analysis:    Component Value Date/Time   COLORURINE STRAW (A) 11/20/2021 2005   APPEARANCEUR CLEAR 11/20/2021 2005   LABSPEC 1.009 11/20/2021 2005   PHURINE 7.0 11/20/2021 2005   GLUCOSEU NEGATIVE 11/20/2021 2005   HGBUR NEGATIVE 11/20/2021 2005   BILIRUBINUR NEGATIVE 11/20/2021 2005   BILIRUBINUR Negative 03/26/2017 1601   KETONESUR 5 (A) 11/20/2021 2005   PROTEINUR NEGATIVE 11/20/2021 2005   UROBILINOGEN 0.2 03/26/2017 1601   NITRITE NEGATIVE 11/20/2021 2005   LEUKOCYTESUR NEGATIVE 11/20/2021 2005    Radiological Exams on Admission: DG Chest 2 View  Result Date: 11/20/2021 CLINICAL DATA:  Chest pain. EXAM:  CHEST - 2 VIEW COMPARISON:  Chest x-ray 09/12/2021 FINDINGS: Patient is status post cardiac surgery. The heart size and mediastinal contours are within normal limits. Both lungs are clear. The visualized skeletal structures are unremarkable. IMPRESSION: No active cardiopulmonary disease. Electronically Signed   By: Darliss Cheney M.D.   On: 11/20/2021 17:32     EKG: Independently reviewed, with result as described above.    Assessment/Plan    Principal Problem:   Postural dizziness with presyncope Active Problems:   Dyslipidemia, goal LDL below 70   Essential hypertension   BPH (benign prostatic hyperplasia)   Dehydration   Acute prerenal azotemia      #) Presyncope: 1 episode of presyncope that occurred when the patient was moving from a seated to standing position, w/o fall or LOC, suggestive of potential orthostatic hypotension in the setting of increased risk for dehydration given recent times been out in increasing ambient temperatures, with clinical evidence of dehydration at presentation, as further detailed below, and exacerbated by home pharmacologic factors serving to diminish compensatory tachycardic/peripheral vasoconstrictive response in the setting of outpatient Coreg as well as valsartan, Norvasc, hydralazine, and multiple alpha 1 blockers including doxazosin and tamsulosin. Will check orthostatic vital signs, but with the caveat that the patient has already received IVF's in the ED, potentially altering the results of this evaluation.    Not associated with any overt acute focal neurologic deficits. Clinically, acute ischemic stroke versus seizures appear less likely at this time.  Presentation is associated with a transient episode of atypical chest pain, which started after onset of dizziness lightheadedness, raising the possibility of a supply/demand mismatch process from transient decrease in systemic perfusion.  ACS is felt to be less likely given the absence of typical  chest pain, while EKG shows no evidence of acute ischemic changes, while high-sensitivity troponin I x2 are found to be nonelevated and flat.    Plan: I have placed a nursing communication order requesting that orthostatic vital signs x 1 set be checked and documented, following which will initiate gentle IVF's in the form of LR at 75 cc/h x 8 hours.  Monitor on telemetry.  We will resume home beta-blocker, with plan to resume valsartan tomorrow, while holding patient's other home antihypertensive medications for now to closely monitor ensuing blood pressure including orthostatic vital signs. Monitor strict I's and O's.  Add-on serum Mg level. Check CMP, CBC, serum Mg level in the AM. Fall precautions ordered. Trend trop.  Echocardiogram ordered for the morning.  Add on D-dimer, with consideration for pursuit of CTA chest in the setting of presyncope and chest pain.  Bilateral carotid ultrasounds ordered.          #) Dehydration: Clinical suspicion for such, including the appearance of dry oral mucous membranes as well as laboratory findings notable for acute prerenal azotemia, with suspicion for contributions from ostensible losses relating to increase in ambient temperature, as above.  Suspect contribution towards presenting episode of presyncope, as above.   Plan: Monitor strict I's and O's.  Daily weights.  Repeat CMP in the morning.  Check orthostatic vital signs followed by initiation of gentle continuous IV fluids, as quantified above.             #) Essential Hypertension: documented h/o such, on several antihypertensive medications as an outpatient, including multiple alpha 1 blockers, as further detailed above.  Will assess for orthostatic hypotension in setting of episode of presyncope, with conservative resumption of outpatient hypertensive medications as outlined above.  Plan: Close monitoring of subsequent BP via routine VS. orthostatic vital signs, as above.  Resume Coreg.   Order placed for resumption of valsartan in the morning.  Holding other hypertensive medications for now, as above.  Monitor strict I's and O's and daily weights.          #) Hyperlipidemia: documented h/o such. On high intensity atorvastatin as outpatient.    Plan: continue home statin.             #) Benign Prostatic Hyperplasia:  documented h/o such; on tamsulosin as outpatient.  In the context of presenting symptom presyncope along with additional alpha 1 blocker on her medication list, will hold home Flomax this evening, will closely monitoring ensuing vital signs and results orthostatic VS, as above.  Plan: monitor strict I's & O's and daily weights. Repeat BMP in AM.  Holding home tamsulosin for now, as above.      DVT prophylaxis: SCD's   Code Status: Full code Family Communication: none Disposition Plan: Per Rounding Team Consults called: none;  Admission status: Observation   PLEASE NOTE THAT DRAGON DICTATION SOFTWARE WAS USED IN THE CONSTRUCTION OF THIS NOTE.   Chaney Born Eydan Chianese DO [facility] From 7PM - 7AM   11/20/2021, 10:48 PM

## 2021-11-20 NOTE — ED Provider Notes (Signed)
[facility] DEPT Provider Note   CSN: [id number] Arrival date & time: 11/20/21  1540     History {Add pertinent medical, surgical, social history, OB history to HPI:1} Chief Complaint  Patient presents with   Near Syncope   Hypotension    CARDIN NITSCHKE is a 76 y.o. male.  76 year old male with a history of CAD status post CABG, hypertension, hyperlipidemia who presents emergency department with lightheadedness.  Patient states that he woke this morning and has not been feeling well all day.  Says that he has been generally fatigued.  Says that he was at [place] with his family at approximately 3 PM when he was seated at the table and started experiencing diaphoresis, lightheadedness, and discomfort in his left neck.  Says that it lasted for approximately 10 minutes and then resolved.  Felt nauseous but denies any vomiting.  Denies any other recent illnesses including fevers, chills, shortness of breath, dysuria or frequency.  No diarrhea or constipation.  Denies any chest discomfort during this episode.   Near Syncope   Past Medical History:  Diagnosis Date   Adenomatous colon polyp    BPH (benign prostatic hyperplasia)    CAD (coronary artery disease)    cardiolite ok 3/05, stress test- low risk study 11/06   Chronic low back pain    with radiculopathy   ED (erectile dysfunction)    Gallstones    abd [place]- stable hamangioma, gallbladder sludge and tiny stones 09/2003   Gastritis    GERD (gastroesophageal reflux disease)    H pylori ulcer    Hyperlipidemia    Hypertension    Transfusion history         Home Medications Prior to Admission medications   Medication Sig Start Date End Date Taking? Authorizing Provider  aluminum hydroxide-magnesium carbonate (GAVISCON) 95-358 MG/15ML SUSP Take 15 mLs by mouth as needed for indigestion.    [provider]  amLODipine (NORVASC) 5 MG tablet Take 0.5 tablets (2.5 mg total) by mouth 2  (two) times daily. 01/03/21   Lorretta Harp, MD  aspirin EC 81 MG tablet Take 1 tablet (81 mg total) by mouth daily. 06/02/16   Almyra Deforest, PA  atorvastatin (LIPITOR) 40 MG tablet Take 1 tablet (40 mg total) by mouth daily. 02/02/21   Lorretta Harp, MD  carvedilol (COREG) 3.125 MG tablet TAKE 1 TABLET BY MOUTH 2 TIMES DAILY WITH A MEAL. 11/16/20   Lorretta Harp, MD  doxazosin (CARDURA) 4 MG tablet TAKE 1 TABLET EVERY DAY 07/26/21   Lorretta Harp, MD  famotidine (PEPCID) 20 MG tablet TAKE 1 TABLET BY MOUTH TWICE A DAY 11/09/21   Tower, Wynelle Fanny, MD  hydrALAZINE (APRESOLINE) 25 MG tablet Take 1 tablet (25 mg total) by mouth 2 (two) times daily. 01/19/21 09/23/21  Lorretta Harp, MD  pantoprazole (PROTONIX) 40 MG tablet TAKE 1 TABLET BY MOUTH EVERY DAY 09/09/21   Esterwood, Amy S, PA-C  Phenylephrine-DM-GG (ROBAFEN CF MULTI-SYMPTOM COLD) 5-10-100 MG/5ML LIQD Take 5 mLs by mouth daily as needed (cough).    [provider]  Polyethyl Glycol-Propyl Glycol (SYSTANE OP) Place 1 drop into both eyes daily as needed (dry eyes).    [provider]  tamsulosin (FLOMAX) 0.4 MG CAPS capsule TAKE 1 CAPSULES BY MOUTH ONCE DAILY. 10/31/21   Tower, Wynelle Fanny, MD  valsartan (DIOVAN) 320 MG tablet TAKE 1 TABLET BY MOUTH EVERY DAY 09/05/21   Lorretta Harp, MD  Allergies    Patient has no known allergies.    Review of Systems   Review of Systems  Cardiovascular:  Positive for near-syncope.    Physical Exam Updated Vital Signs BP (!) 126/58   Pulse 65   Temp 98 F (36.7 C) (Oral)   Resp 13   Ht '5\' 7"'$  (1.702 m)   Wt 83.9 kg   SpO2 98%   BMI 28.98 kg/m  Physical Exam Vitals and nursing note reviewed.  Constitutional:      General: He is not in acute distress.    Appearance: He is well-developed.  HENT:     Head: Normocephalic and atraumatic.     Right Ear: External ear normal.     Left Ear: External ear normal.     Nose: Nose normal.     Mouth/Throat:     Mouth: Mucous  membranes are moist.     Pharynx: Oropharynx is clear.  Eyes:     Extraocular Movements: Extraocular movements intact.     Conjunctiva/sclera: Conjunctivae normal.     Pupils: Pupils are equal, round, and reactive to light.  Cardiovascular:     Rate and Rhythm: Normal rate and regular rhythm.     Pulses: Normal pulses.     Heart sounds: Normal heart sounds. No murmur heard.    Comments: Sternotomy scar present Pulmonary:     Effort: Pulmonary effort is normal. No respiratory distress.     Breath sounds: Normal breath sounds.  Abdominal:     General: There is no distension.     Palpations: Abdomen is soft. There is no mass.     Tenderness: There is no abdominal tenderness. There is no guarding.  Musculoskeletal:        General: No swelling.     Cervical back: Neck supple.     Right lower leg: No edema.     Left lower leg: No edema.  Skin:    General: Skin is warm and dry.     Capillary Refill: Capillary refill takes less than 2 seconds.  Neurological:     General: No focal deficit present.     Mental Status: He is alert and oriented to person, place, and time. Mental status is at baseline.  Psychiatric:        Mood and Affect: Mood normal.     ED Results / Procedures / Treatments   Labs (all labs ordered are listed, but only abnormal results are displayed) Labs Reviewed  CBC - Abnormal; Notable for the following components:      Result Value   Platelets 135 (*)    All other components within normal limits  COMPREHENSIVE METABOLIC PANEL - Abnormal; Notable for the following components:   Glucose, Bld 101 (*)    BUN 26 (*)    Calcium 8.7 (*)    Total Bilirubin 1.3 (*)    All other components within normal limits  TROPONIN I (HIGH SENSITIVITY)  TROPONIN I (HIGH SENSITIVITY)    EKG EKG Interpretation  Date/Time:  Sunday November 20 2021 16:25:27 EDT Ventricular Rate:  69 PR Interval:  163 QRS Duration: 105 QT Interval:  382 QTC Calculation: 410 R Axis:   75 Text  Interpretation: Normal Sinus rhythm Nonspecific T abnormalities, lateral leads When compared with ECG of 09/12/2021 new t wave inversion in lead I though interpretation limited by poor baseline. Confirmed by Margaretmary Eddy ([id number]) on 11/20/2021 6:38:09 PM  Radiology DG Chest 2 View  Result Date: 11/20/2021 CLINICAL DATA:  Chest pain. EXAM: CHEST - 2 VIEW COMPARISON:  Chest x-ray 09/12/2021 FINDINGS: Patient is status post cardiac surgery. The heart size and mediastinal contours are within normal limits. Both lungs are clear. The visualized skeletal structures are unremarkable. IMPRESSION: No active cardiopulmonary disease. Electronically Signed   By: Ronney Asters M.D.   On: 11/20/2021 17:32    Procedures Procedures  {Document cardiac monitor, telemetry assessment procedure when appropriate:1}  Medications Ordered in ED Medications  lactated ringers bolus 1,000 mL (has no administration in time range)    ED Course/ Medical Decision Making/ A&P                           Medical Decision Making  ***  {Document critical care time when appropriate:1} {Document review of labs and clinical decision tools ie heart score, Chads2Vasc2 etc:1}  {Document your independent review of radiology images, and any outside records:1} {Document your discussion with family members, caretakers, and with consultants:1} {Document social determinants of health affecting pt's care:1} {Document your decision making why or why not admission, treatments were needed:1} Final Clinical Impression(s) / ED Diagnoses Final diagnoses:  None    Rx / DC Orders ED Discharge Orders     None

## 2021-11-20 NOTE — ED Notes (Signed)
During Ortho VS, pt felt fine while laying and sitting. The moment pt stood up and had to remain standing, pt stated they felt very light-headed, start losing balance a little, and starting sweating.

## 2021-11-21 ENCOUNTER — Observation Stay (HOSPITAL_COMMUNITY): Payer: Medicare Other

## 2021-11-21 DIAGNOSIS — R42 Dizziness and giddiness: Secondary | ICD-10-CM | POA: Diagnosis not present

## 2021-11-21 DIAGNOSIS — K219 Gastro-esophageal reflux disease without esophagitis: Secondary | ICD-10-CM | POA: Diagnosis present

## 2021-11-21 DIAGNOSIS — D509 Iron deficiency anemia, unspecified: Secondary | ICD-10-CM | POA: Diagnosis present

## 2021-11-21 DIAGNOSIS — I959 Hypotension, unspecified: Secondary | ICD-10-CM | POA: Diagnosis present

## 2021-11-21 DIAGNOSIS — R55 Syncope and collapse: Secondary | ICD-10-CM

## 2021-11-21 DIAGNOSIS — Z903 Acquired absence of stomach [part of]: Secondary | ICD-10-CM | POA: Diagnosis not present

## 2021-11-21 DIAGNOSIS — K921 Melena: Secondary | ICD-10-CM | POA: Diagnosis not present

## 2021-11-21 DIAGNOSIS — Z951 Presence of aortocoronary bypass graft: Secondary | ICD-10-CM | POA: Diagnosis not present

## 2021-11-21 DIAGNOSIS — E860 Dehydration: Secondary | ICD-10-CM | POA: Diagnosis present

## 2021-11-21 DIAGNOSIS — Z8249 Family history of ischemic heart disease and other diseases of the circulatory system: Secondary | ICD-10-CM | POA: Diagnosis not present

## 2021-11-21 DIAGNOSIS — Z8619 Personal history of other infectious and parasitic diseases: Secondary | ICD-10-CM | POA: Diagnosis not present

## 2021-11-21 DIAGNOSIS — Z9842 Cataract extraction status, left eye: Secondary | ICD-10-CM | POA: Diagnosis not present

## 2021-11-21 DIAGNOSIS — K91840 Postprocedural hemorrhage and hematoma of a digestive system organ or structure following a digestive system procedure: Secondary | ICD-10-CM | POA: Diagnosis present

## 2021-11-21 DIAGNOSIS — Y838 Other surgical procedures as the cause of abnormal reaction of the patient, or of later complication, without mention of misadventure at the time of the procedure: Secondary | ICD-10-CM | POA: Diagnosis present

## 2021-11-21 DIAGNOSIS — Q399 Congenital malformation of esophagus, unspecified: Secondary | ICD-10-CM | POA: Diagnosis not present

## 2021-11-21 DIAGNOSIS — Z8719 Personal history of other diseases of the digestive system: Secondary | ICD-10-CM | POA: Diagnosis not present

## 2021-11-21 DIAGNOSIS — R9431 Abnormal electrocardiogram [ECG] [EKG]: Secondary | ICD-10-CM | POA: Diagnosis present

## 2021-11-21 DIAGNOSIS — M545 Low back pain, unspecified: Secondary | ICD-10-CM | POA: Diagnosis present

## 2021-11-21 DIAGNOSIS — I2510 Atherosclerotic heart disease of native coronary artery without angina pectoris: Secondary | ICD-10-CM | POA: Diagnosis present

## 2021-11-21 DIAGNOSIS — K922 Gastrointestinal hemorrhage, unspecified: Secondary | ICD-10-CM | POA: Diagnosis present

## 2021-11-21 DIAGNOSIS — Z7982 Long term (current) use of aspirin: Secondary | ICD-10-CM | POA: Diagnosis not present

## 2021-11-21 DIAGNOSIS — D62 Acute posthemorrhagic anemia: Secondary | ICD-10-CM | POA: Diagnosis present

## 2021-11-21 DIAGNOSIS — I10 Essential (primary) hypertension: Secondary | ICD-10-CM | POA: Diagnosis not present

## 2021-11-21 DIAGNOSIS — N19 Unspecified kidney failure: Secondary | ICD-10-CM | POA: Diagnosis present

## 2021-11-21 DIAGNOSIS — Z86010 Personal history of colonic polyps: Secondary | ICD-10-CM | POA: Diagnosis not present

## 2021-11-21 DIAGNOSIS — N400 Enlarged prostate without lower urinary tract symptoms: Secondary | ICD-10-CM | POA: Diagnosis present

## 2021-11-21 DIAGNOSIS — K254 Chronic or unspecified gastric ulcer with hemorrhage: Secondary | ICD-10-CM | POA: Diagnosis present

## 2021-11-21 DIAGNOSIS — Z961 Presence of intraocular lens: Secondary | ICD-10-CM | POA: Diagnosis present

## 2021-11-21 DIAGNOSIS — G8929 Other chronic pain: Secondary | ICD-10-CM | POA: Diagnosis present

## 2021-11-21 DIAGNOSIS — Z980 Intestinal bypass and anastomosis status: Secondary | ICD-10-CM | POA: Diagnosis not present

## 2021-11-21 DIAGNOSIS — Z800 Family history of malignant neoplasm of digestive organs: Secondary | ICD-10-CM | POA: Diagnosis not present

## 2021-11-21 DIAGNOSIS — E785 Hyperlipidemia, unspecified: Secondary | ICD-10-CM | POA: Diagnosis present

## 2021-11-21 DIAGNOSIS — Z808 Family history of malignant neoplasm of other organs or systems: Secondary | ICD-10-CM | POA: Diagnosis not present

## 2021-11-21 LAB — ECHOCARDIOGRAM COMPLETE
AR max vel: 2.89 cm2
AV Peak grad: 6.7 mmHg
Ao pk vel: 1.3 m/s
Area-P 1/2: 4.46 cm2
Height: 67 in
S' Lateral: 1.1 cm
Weight: 2960 oz

## 2021-11-21 LAB — COMPREHENSIVE METABOLIC PANEL
ALT: 24 U/L (ref 0–44)
AST: 15 U/L (ref 15–41)
Albumin: 2.8 g/dL — ABNORMAL LOW (ref 3.5–5.0)
Alkaline Phosphatase: 59 U/L (ref 38–126)
Anion gap: 5 (ref 5–15)
BUN: 43 mg/dL — ABNORMAL HIGH (ref 8–23)
CO2: 24 mmol/L (ref 22–32)
Calcium: 8.3 mg/dL — ABNORMAL LOW (ref 8.9–10.3)
Chloride: 110 mmol/L (ref 98–111)
Creatinine, Ser: 1.04 mg/dL (ref 0.61–1.24)
GFR, Estimated: >60 mL/min (ref >60)
Glucose, Bld: 108 mg/dL — ABNORMAL HIGH (ref 70–99)
Potassium: 4.4 mmol/L (ref 3.5–5.1)
Sodium: 139 mmol/L (ref 135–145)
Total Bilirubin: 1.1 mg/dL (ref 0.3–1.2)
Total Protein: 4.9 g/dL — ABNORMAL LOW (ref 6.5–8.1)

## 2021-11-21 LAB — CBC WITH DIFFERENTIAL/PLATELET
Abs Immature Granulocytes: 0.04 10*3/uL (ref 0.00–0.07)
Basophils Absolute: 0 10*3/uL (ref 0.0–0.1)
Basophils Relative: 0 %
Eosinophils Absolute: 0 10*3/uL (ref 0.0–0.5)
Eosinophils Relative: 0 %
HCT: 28.5 % — ABNORMAL LOW (ref 39.0–52.0)
Hemoglobin: 9.5 g/dL — ABNORMAL LOW (ref 13.0–17.0)
Immature Granulocytes: 1 %
Lymphocytes Relative: 15 %
Lymphs Abs: 1.2 10*3/uL (ref 0.7–4.0)
MCH: 29.6 pg (ref 26.0–34.0)
MCHC: 33.3 g/dL (ref 30.0–36.0)
MCV: 88.8 fL (ref 80.0–100.0)
Monocytes Absolute: 0.5 10*3/uL (ref 0.1–1.0)
Monocytes Relative: 6 %
Neutro Abs: 6.4 10*3/uL (ref 1.7–7.7)
Neutrophils Relative %: 78 %
Platelets: 126 10*3/uL — ABNORMAL LOW (ref 150–400)
RBC: 3.21 MIL/uL — ABNORMAL LOW (ref 4.22–5.81)
RDW: 13.1 % (ref 11.5–15.5)
WBC: 8.2 10*3/uL (ref 4.0–10.5)
nRBC: 0 % (ref 0.0–0.2)

## 2021-11-21 LAB — MAGNESIUM: Magnesium: 2.2 mg/dL (ref 1.7–2.4)

## 2021-11-21 LAB — GLUCOSE, CAPILLARY: Glucose-Capillary: 124 mg/dL — ABNORMAL HIGH (ref 70–99)

## 2021-11-21 LAB — HEMOGLOBIN AND HEMATOCRIT, BLOOD
HCT: 23.3 % — ABNORMAL LOW (ref 39.0–52.0)
Hemoglobin: 7.8 g/dL — ABNORMAL LOW (ref 13.0–17.0)

## 2021-11-21 LAB — TROPONIN I (HIGH SENSITIVITY): Troponin I (High Sensitivity): 7 ng/L (ref <18)

## 2021-11-21 LAB — PREPARE RBC (CROSSMATCH)

## 2021-11-21 MED ORDER — LACTATED RINGERS IV SOLN
INTRAVENOUS | Status: DC
Start: 2021-11-21 — End: 2021-11-23

## 2021-11-21 MED ORDER — SODIUM CHLORIDE 0.9 % IV BOLUS
1000.0000 mL | Freq: Once | INTRAVENOUS | Status: AC
  Administered 2021-11-21: 1000 mL via INTRAVENOUS

## 2021-11-21 MED ORDER — PANTOPRAZOLE SODIUM 40 MG IV SOLR
40.0000 mg | Freq: Two times a day (BID) | INTRAVENOUS | Status: DC
  Administered 2021-11-21 – 2021-11-23 (×4): 40 mg via INTRAVENOUS
  Filled 2021-11-21 (×4): qty 10

## 2021-11-21 MED ORDER — ONDANSETRON HCL 4 MG/2ML IJ SOLN
4.0000 mg | Freq: Four times a day (QID) | INTRAMUSCULAR | Status: DC | PRN
  Administered 2021-11-21: 4 mg via INTRAVENOUS
  Filled 2021-11-21: qty 2

## 2021-11-21 MED ORDER — CHLORHEXIDINE GLUCONATE CLOTH 2 % EX PADS
6.0000 | MEDICATED_PAD | Freq: Every day | CUTANEOUS | Status: DC
  Administered 2021-11-21 – 2021-11-23 (×2): 6 via TOPICAL

## 2021-11-21 MED ORDER — LACTATED RINGERS IV BOLUS
500.0000 mL | Freq: Once | INTRAVENOUS | Status: AC
Start: 2021-11-21 — End: 2021-11-21
  Administered 2021-11-21: 500 mL via INTRAVENOUS

## 2021-11-21 MED ORDER — SODIUM CHLORIDE 0.9% IV SOLUTION
Freq: Once | INTRAVENOUS | Status: AC
Start: 2021-11-21 — End: 2021-11-21

## 2021-11-21 NOTE — Consult Note (Addendum)
Consultation  Referring Provider:    Primary Care Physician:  Tower, Wynelle Fanny, MD Primary Gastroenterologist:  Dr. Fuller Plan       Reason for Consultation:   Anemia, presyncope/hypotension   Impression    Anemia with hemodynamic compromise of hypertension HGB 9.5 MCV 88.8 Platelets 126 BUN 43 (26) Cr 1.04 (1.03) endoscopy 11/15/2021 which showed normal esophagus, Billroth I with healthy-appearing mucosa, greater curvature clip, erythematous mucosa in stomach, normal duodenum pending biopsies.  Hypotension Continue resuscitation, monitor closely. Having inpatient work-up with primary team  Personal history of adenomatous polyps had colonoscopy 07/2015 4 mm tubular adenoma, no recall due to age   Coronary artery disease status post bypass 1 episode of brief chest pain, presyncopal episodes. Unremarkable EKGs at this time, negative D-dimer. Pending echocardiogram.  History of gastric ulcer 2018, previously treated H. pylori, partial gastrectomy Recent unremarkable EGD Pending biopsies    Plan   -Pending biopsies from EGD 11/15/2021. -Protonix 40 mg IV BID. --Continue to monitor H&H with transfusion as needed to maintain hemoglobin greater than 8 given cardiac history. -We will repeat CBC to assure no lab error however from history and information likely GI bleed, get anemia panel -Patient with recent endoscopy that was negative, having black stools on exam Hemoccult positive, elevated BUN with normal creatinine, hemoglobin 13.3 down to 9.5.   Possible component of hemoconcentration, had 1 L fluids however reporting dark stools outpatient, likely upper GI bleed.   -With patient's soft blood pressures, continuing symptomatic dizziness/orthostatics we will plan for resuscitation today with push enteroscopy tomorrow. Patient was also due for colonoscopy 2022, had colonoscopy 07/2015 4 mm tubular adenoma, no recall due to age.  Due to anemia and FOBT positive stool, would likely  benefit from a colonoscopy as well to rule out right-sided bleeding if enteroscopy negative this admission, uncertain patient would be able to tolerate prep at this time..  I thoroughly discussed the procedure to include nature, alternatives, benefits, and risks including but not limited to bleeding, perforation, infection, anesthesia/cardiac and pulmonary complications. Patient provides understanding and gave verbal consent to proceed.  Thank you for your kind consultation, we will continue to follow.         HPI:   Jerry Farrell is a 76 y.o. male with past medical history significant for hypertension, hyperlipidemia, coronary disease status post single-vessel CABG 94, history of gallstones, chronic back pain, peptic ulcer disease status post partial gastrectomy 1980s, previous H. pylori treatment 2018, history of GI bleed secondary to gastric ulcer 2018, history of colon polyps.  10/20/2021 patient seen in the office by Dr. Fuller Plan complaining of reflux, gas belching, pericardial chest pain, had endoscopy 11/15/2021 which showed normal esophagus, Billroth I with healthy-appearing mucosa, greater curvature clip, erythematous mucosa in stomach, normal duodenum pending biopsies.   Patient presented to the ER with presyncope and episodes of lightheadedness.   Patient states he was not feeling well Sunday morning, went to [employer] to eat and started to have sweating, dizziness, nausea without vomiting. Also describes some minimal left-sided sharp pain radiating to the left neck.  Follows with Dr. Alvester Chou. Patient states prior to this he has been having some dark stool at home no iron, takes Mylanta and Gaviscon.  Denies Pepto-Bismol. Patient's been having lower abdominal discomfort better after a bowel movement.  Last bowel movement was this morning loose stools. Patient's also had some nausea this morning without vomiting.  Denies any dyspnea, cough, wheezing, fever, chills, any more  chest  pain. Denies NSAIDs, patient does not drink, no drug use no smoking.  Significant labs in the ER include BUN of 26 on 11/20/2021 increasing to 43 on 11/21/2021, creatinine 1.03.  States she was drop in hemoglobin from 13.3 on 11/20/2021 down to 9.5 on 11/21/2021.  There is no overt GI bleeding, normocytic anemia 88.8 platelets are decreased 126.  Patient is received 1 L saline. Patient's had negative troponins Normal liver function.  EGD 06/07/2016 during his hospital admission for GI bleed and anemia: - Normal esophagus. - Billroth I gastroduodenostomy was found. - Gastric ulcer with adherent clot. Clips (MR conditional) were placed. - Normal examined duodenum. - No specimens collected.   EGD 06/01/2016 done due to having dysphagia: - No endoscopic esophageal abnormality to explain patient's dysphagia. Esophagus dilated. - Patent Billroth I gastroduodenostomy was found, characterized by erythema. - Gastritis. Biopsied. - Normal second portion of the duodenum. - CHRONIC GASTRITIS. - WARTHIN-STARRY STAIN POSITIVE FOR HELICOBACTER PYLORI.   Colonoscopy 02/15/2016: -One 4 mm polyp in the cecum, removed with a cold biopsy forceps. Resected and retrieved. - Internal hemorrhoids. - The examination was otherwise normal on direct and retroflexion views - 5 years - TUBULAR ADENOMA. NO HIGH GRADE DYSPLASIA OR MALIGNANCY IDENTIFIED.  Abnormal ED labs: Abnormal Labs Reviewed  CBC - Abnormal; Notable for the following components:      Result Value   Platelets 135 (*)    All other components within normal limits  COMPREHENSIVE METABOLIC PANEL - Abnormal; Notable for the following components:   Glucose, Bld 101 (*)    BUN 26 (*)    Calcium 8.7 (*)    Total Bilirubin 1.3 (*)    All other components within normal limits  URINALYSIS, ROUTINE W REFLEX MICROSCOPIC - Abnormal; Notable for the following components:   Color, Urine STRAW (*)    Ketones, ur 5 (*)    All other components within  normal limits  CBC WITH DIFFERENTIAL/PLATELET - Abnormal; Notable for the following components:   RBC 3.21 (*)    Hemoglobin 9.5 (*)    HCT 28.5 (*)    Platelets 126 (*)    All other components within normal limits  COMPREHENSIVE METABOLIC PANEL - Abnormal; Notable for the following components:   Glucose, Bld 108 (*)    BUN 43 (*)    Calcium 8.3 (*)    Total Protein 4.9 (*)    Albumin 2.8 (*)    All other components within normal limits     Past Medical History:  Diagnosis Date   Adenomatous colon polyp    BPH (benign prostatic hyperplasia)    CAD (coronary artery disease)    cardiolite ok 3/05, stress test- low risk study 11/06   Chronic low back pain    with radiculopathy   ED (erectile dysfunction)    Gallstones    abd [place]- stable hamangioma, gallbladder sludge and tiny stones 09/2003   Gastritis    GERD (gastroesophageal reflux disease)    H pylori ulcer    Hyperlipidemia    Hypertension    Transfusion history     Surgical History:  He  has a past surgical history that includes Gastrectomy; Coronary artery bypass graft (1998); Pars plana vitrectomy (03/13/2012); Membrane peel (03/13/2012); Cataract extraction w/PHACO (03/13/2012); Esophagogastroduodenoscopy (egd) with propofol (N/A, 06/07/2016); and LEFT HEART CATH AND CORS/GRAFTS ANGIOGRAPHY (N/A, 12/02/2020). Family History:  His family history includes Colon cancer in his brother; Hypertension in his mother; Throat cancer in his brother. Social History:  reports that he has never smoked. He has never used smokeless tobacco. He reports that he does not drink alcohol and does not use drugs.  Prior to Admission medications   Medication Sig Start Date End Date Taking? Authorizing Provider  acetaminophen (TYLENOL) 500 MG tablet Take 1,000 mg by mouth every 6 (six) hours as needed for mild pain or headache.   Yes [provider]  aluminum hydroxide-magnesium carbonate (GAVISCON) 95-358 MG/15ML SUSP Take 15 mLs by  mouth as needed for indigestion.   Yes [provider]  amLODipine (NORVASC) 5 MG tablet Take 0.5 tablets (2.5 mg total) by mouth 2 (two) times daily. 01/03/21  Yes Lorretta Harp, MD  aspirin EC 81 MG tablet Take 1 tablet (81 mg total) by mouth daily. 06/02/16  Yes Almyra Deforest, PA  atorvastatin (LIPITOR) 40 MG tablet Take 1 tablet (40 mg total) by mouth daily. Patient taking differently: Take 40 mg by mouth at bedtime. 02/02/21  Yes Lorretta Harp, MD  carvedilol (COREG) 3.125 MG tablet TAKE 1 TABLET BY MOUTH 2 TIMES DAILY WITH A MEAL. Patient taking differently: Take 3.125 mg by mouth 2 (two) times daily with a meal. 11/16/20  Yes Lorretta Harp, MD  doxazosin (CARDURA) 4 MG tablet TAKE 1 TABLET EVERY DAY Patient taking differently: Take 4 mg by mouth daily. 07/26/21  Yes Lorretta Harp, MD  famotidine (PEPCID) 20 MG tablet TAKE 1 TABLET BY MOUTH TWICE A DAY Patient taking differently: Take 20 mg by mouth 2 (two) times daily. 11/09/21  Yes Tower, Wynelle Fanny, MD  hydrALAZINE (APRESOLINE) 25 MG tablet Take 1 tablet (25 mg total) by mouth 2 (two) times daily. 01/19/21 11/20/21 Yes Lorretta Harp, MD  pantoprazole (PROTONIX) 40 MG tablet TAKE 1 TABLET BY MOUTH EVERY DAY Patient taking differently: Take 40 mg by mouth daily before breakfast. 09/09/21  Yes Esterwood, Amy S, PA-C  Phenylephrine-DM-GG (ROBAFEN CF MULTI-SYMPTOM COLD) 5-10-100 MG/5ML LIQD Take 5 mLs by mouth daily as needed (cough).   Yes [provider]  SYSTANE BALANCE 0.6 % SOLN Place 1 drop into both eyes 3 (three) times daily as needed (for dryness).   Yes [provider]  tamsulosin (FLOMAX) 0.4 MG CAPS capsule TAKE 1 CAPSULES BY MOUTH ONCE DAILY. Patient taking differently: Take 0.4 mg by mouth at bedtime. 10/31/21  Yes Tower, Wynelle Fanny, MD  valsartan (DIOVAN) 320 MG tablet TAKE 1 TABLET BY MOUTH EVERY DAY Patient taking differently: Take 320 mg by mouth daily. 09/05/21  Yes Lorretta Harp, MD     Current Facility-Administered Medications  Medication Dose Route Frequency Provider Last Rate Last Admin   acetaminophen (TYLENOL) tablet 650 mg  650 mg Oral Q6H PRN Howerter, Justin B, DO       Or   acetaminophen (TYLENOL) suppository 650 mg  650 mg Rectal Q6H PRN Howerter, Justin B, DO       aspirin EC tablet 81 mg  81 mg Oral Daily Howerter, Justin B, DO       atorvastatin (LIPITOR) tablet 40 mg  40 mg Oral QHS Howerter, Justin B, DO   40 mg at 11/20/21 2355   famotidine (PEPCID) tablet 20 mg  20 mg Oral BID Howerter, Justin B, DO   20 mg at 11/20/21 2355   lactated ringers bolus 500 mL  500 mL Intravenous Once Vashti Hey, MD       lactated ringers infusion   Intravenous Continuous Jamse Arn Kyra Searles, MD  ondansetron (ZOFRAN) injection 4 mg  4 mg Intravenous Q6H PRN Howerter, Justin B, DO   4 mg at 11/21/21 0451   pantoprazole (PROTONIX) EC tablet 40 mg  40 mg Oral QAC breakfast Howerter, Justin B, DO   40 mg at 11/21/21 9381   Current Outpatient Medications  Medication Sig Dispense Refill   acetaminophen (TYLENOL) 500 MG tablet Take 1,000 mg by mouth every 6 (six) hours as needed for mild pain or headache.     aluminum hydroxide-magnesium carbonate (GAVISCON) 95-358 MG/15ML SUSP Take 15 mLs by mouth as needed for indigestion.     amLODipine (NORVASC) 5 MG tablet Take 0.5 tablets (2.5 mg total) by mouth 2 (two) times daily. 90 tablet 3   aspirin EC 81 MG tablet Take 1 tablet (81 mg total) by mouth daily. 90 tablet 3   atorvastatin (LIPITOR) 40 MG tablet Take 1 tablet (40 mg total) by mouth daily. (Patient taking differently: Take 40 mg by mouth at bedtime.) 90 tablet 3   carvedilol (COREG) 3.125 MG tablet TAKE 1 TABLET BY MOUTH 2 TIMES DAILY WITH A MEAL. (Patient taking differently: Take 3.125 mg by mouth 2 (two) times daily with a meal.) 180 tablet 3   doxazosin (CARDURA) 4 MG tablet TAKE 1 TABLET EVERY DAY (Patient taking differently: Take 4 mg by mouth  daily.) 90 tablet 2   famotidine (PEPCID) 20 MG tablet TAKE 1 TABLET BY MOUTH TWICE A DAY (Patient taking differently: Take 20 mg by mouth 2 (two) times daily.) 60 tablet 1   hydrALAZINE (APRESOLINE) 25 MG tablet Take 1 tablet (25 mg total) by mouth 2 (two) times daily. 180 tablet 3   pantoprazole (PROTONIX) 40 MG tablet TAKE 1 TABLET BY MOUTH EVERY DAY (Patient taking differently: Take 40 mg by mouth daily before breakfast.) 90 tablet 0   Phenylephrine-DM-GG (ROBAFEN CF MULTI-SYMPTOM COLD) 5-10-100 MG/5ML LIQD Take 5 mLs by mouth daily as needed (cough).     SYSTANE BALANCE 0.6 % SOLN Place 1 drop into both eyes 3 (three) times daily as needed (for dryness).     tamsulosin (FLOMAX) 0.4 MG CAPS capsule TAKE 1 CAPSULES BY MOUTH ONCE DAILY. (Patient taking differently: Take 0.4 mg by mouth at bedtime.) 90 capsule 1   valsartan (DIOVAN) 320 MG tablet TAKE 1 TABLET BY MOUTH EVERY DAY (Patient taking differently: Take 320 mg by mouth daily.) 90 tablet 3    Allergies as of 11/20/2021   (No Known Allergies)    Review of Systems:    Constitutional: No weight loss, fever, chills, weakness or fatigue HEENT: Eyes: No change in vision               Ears, Nose, Throat:  No change in hearing or congestion Skin: No rash or itching Cardiovascular: No chest pain, chest pressure or palpitations   Respiratory: No SOB or cough Gastrointestinal: See HPI and otherwise negative Genitourinary: No dysuria or change in urinary frequency Neurological: No headache, dizziness or syncope Musculoskeletal: No new muscle or joint pain Hematologic: No bleeding or bruising Psychiatric: No history of depression or anxiety     Physical Exam:  Vital signs in last 24 hours: Temp:  [97.6 F (36.4 C)-98.8 F (37.1 C)] 98.8 F (37.1 C) (07/31 0444) Pulse Rate:  [65-106] 106 (07/31 0730) Resp:  [13-25] 17 (07/31 0730) BP: (95-152)/(48-74) 95/60 (07/31 0730) SpO2:  [95 %-99 %] 99 % (07/31 0730) Weight:  [83.9 kg] 83.9 kg  (07/30 1638)   Last BM recorded by  nurses in past 5 days No data recorded  General:   Pleasant, well developed male in no acute distress Head:  Normocephalic and atraumatic. Eyes: sclerae anicteric,conjunctive pink  Heart:  regular rate and rhythm Pulm: Clear anteriorly; no wheezing Abdomen:  Soft, Obese AB, Active bowel sounds. mild tenderness in the epigastrium. Without guarding and Without rebound, No organomegaly appreciated. Extremities:  Without edema. Msk:  Symmetrical without gross deformities. Peripheral pulses intact.  Neurologic:  Alert and  oriented x4;  No focal deficits.  Skin:   Dry and intact without significant lesions or rashes. Psychiatric:  Cooperative. Normal mood and affect.  LAB RESULTS: Recent Labs    11/20/21 1707 11/21/21 0418  WBC 9.5 8.2  HGB 13.3 9.5*  HCT 40.5 28.5*  PLT 135* 126*   BMET Recent Labs    11/20/21 1707 11/21/21 0418  NA 140 139  K 4.3 4.4  CL 107 110  CO2 25 24  GLUCOSE 101* 108*  BUN 26* 43*  CREATININE 1.03 1.04  CALCIUM 8.7* 8.3*   LFT Recent Labs    11/21/21 0418  PROT 4.9*  ALBUMIN 2.8*  AST 15  ALT 24  ALKPHOS 59  BILITOT 1.1   PT/INR No results for input(s): "LABPROT", "INR" in the last 72 hours.  STUDIES: DG Chest 2 View  Result Date: 11/20/2021 CLINICAL DATA:  Chest pain. EXAM: CHEST - 2 VIEW COMPARISON:  Chest x-ray 09/12/2021 FINDINGS: Patient is status post cardiac surgery. The heart size and mediastinal contours are within normal limits. Both lungs are clear. The visualized skeletal structures are unremarkable. IMPRESSION: No active cardiopulmonary disease. Electronically Signed   By: Ronney Asters M.D.   On: 11/20/2021 17:32     Vladimir Crofts  11/21/2021, 8:24 AM    --------------------------------------------------------------------------------------------------------------------  I have taken a history, reviewed the chart and examined the patient. I performed a substantive portion of this  encounter, including complete performance of at least one of the key components, in conjunction with the APP. I agree with the APP's note, impression and recommendations.  76 year old male admitted with presyncopal episodes, found to have acute drop in Hgb with elevated BUN.  Patient not able to provide detailed account of his stool appearance, but after experiencing a melenic bowel movement in the hospital today, did agree that his stools have been looking like that for a few days.  His blood pressure has been labile in the hospital and he had a melenic stool about an hour ago. High degree of suspicion for upper GI bleed, but etiology unclear, as patient just had an EGD a week ago.  EGD was without any notable bleeding sources (and was performed to evaluate chest pain) and gastric biopsies were taken.  Although clinically significant bleeding from biopsy sites is extremely rare, this could be a plausible explanation for him.   Will plan for push enteroscopy tomorrow to evaluate deeper into the small bowel for other potential causes of bleeding, such as AVMs.  - Please keep patient on clear liquid diet, NPO after midnight - If patient shows further evidence of bleeding this afternoon, would make NPO - Continue BID PPI IV - CBC q6 hours - Transfuse for hgb <7 or hemodynamic instability - Agree with transfer to SDU given labile BP and recent melenic stool  Muhannad Bignell E. Candis Schatz, MD [facility]

## 2021-11-21 NOTE — Anesthesia Preprocedure Evaluation (Addendum)
Anesthesia Evaluation  Patient identified by MRN, date of birth, ID band Patient awake    Reviewed: Allergy & Precautions, NPO status , Patient's Chart, lab work & pertinent test results  Airway Mallampati: II  TM Distance: >3 FB Neck ROM: Full    Dental no notable dental hx. (+) Teeth Intact, Dental Advisory Given   Pulmonary neg pulmonary ROS,    Pulmonary exam normal breath sounds clear to auscultation       Cardiovascular hypertension, Normal cardiovascular exam Rhythm:Regular Rate:Normal     Neuro/Psych negative neurological ROS     GI/Hepatic Neg liver ROS, GERD  ,  Endo/Other    Renal/GU negative Renal ROS     Musculoskeletal   Abdominal   Peds  Hematology   Anesthesia Other Findings   Reproductive/Obstetrics                           Anesthesia Physical Anesthesia Plan  ASA: 3  Anesthesia Plan: MAC   Post-op Pain Management:    Induction:   PONV Risk Score and Plan: Treatment may vary due to age or medical condition  Airway Management Planned: Natural Airway and Nasal Cannula  Additional Equipment: None  Intra-op Plan:   Post-operative Plan:   Informed Consent: I have reviewed the patients History and Physical, chart, labs and discussed the procedure including the risks, benefits and alternatives for the proposed anesthesia with the patient or authorized representative who has indicated his/her understanding and acceptance.     Dental advisory given  Plan Discussed with: CRNA  Anesthesia Plan Comments: (Enteroscopy for Melena was anemia)      Anesthesia Quick Evaluation

## 2021-11-21 NOTE — Progress Notes (Signed)
Pt had large black type 5 BM upon admission to [facility]. Pt became dizzy and weak and needed to be assisted back to bed. Vitals taken, see flow sheet. Pt A &O, GI and Chatterjee notified, see new orders.

## 2021-11-21 NOTE — Progress Notes (Signed)
Carotid artery duplex has been completed. Preliminary results can be found in CV Proc through chart review.   11/21/21 11:15 AM Carlos Levering RVT

## 2021-11-21 NOTE — ED Notes (Signed)
Pt had a large black tarry stool.

## 2021-11-21 NOTE — Progress Notes (Addendum)
PROGRESS NOTE    Jerry Farrell  MLY:[id number]  DOB: 10/03/1945  DOA: 11/20/2021 PCP: Abner Greenspan, MD Outpatient Specialists:   Hospital course:  76 year old man was admitted yesterday with recurrent presyncope after outpatient work-up.  He was thought to be dehydrated however overnight patient dropped his hemoglobin from 13-9 and since this morning patient has had 2 large black stool.  Hemoglobin has now decreased to 7.8.  Patient has responded well to IV fluid resuscitation.  Subjective:  Patient has no acute complaints.  He does think he has had black stools in the past but he had attributed that to Gaviscon as he knows that Pepto-Bismol can cause black stool.  Patient denies chest pain, does admit to feeling a little dizzy and weak.  Objective: Vitals:   11/21/21 1000 11/21/21 1200 11/21/21 1244 11/21/21 1325  BP: (!) 92/49 (!) 120/53  (!) 143/60  Pulse: (!) 104 92  84  Resp: (!) 23 18    Temp:   100.1 F (37.8 C)   TempSrc:   Oral   SpO2: 98% 98%  98%  Weight:      Height:        Intake/Output Summary (Last 24 hours) at 11/21/2021 1614 Last data filed at 11/21/2021 0008 Gross per 24 hour  Intake 1000 ml  Output --  Net 1000 ml   Filed Weights   11/20/21 1638  Weight: 83.9 kg     Exam:  General: Somewhat pale patient lying in bed with attentive wife at bedside Eyes: sclera anicteric, conjuctiva mild injection bilaterally CVS: S1-S2, regular  Respiratory:  decreased air entry bilaterally secondary to decreased inspiratory effort, rales at bases  GI: NABS, soft, NT  LE: No edema.  Neuro: A/O x 3, Moving all extremities equally with normal strength, CN 3-12 intact, grossly nonfocal.  Psych: patient is logical and coherent, judgement and insight appear normal, mood and affect appropriate to situation.   Assessment & Plan:   Ongoing GI bleed/melena Patient with significant drop in H&H over the past 24 hours. He has responded well to IV fluid  resuscitation and has been hemodynamically stable. Given ongoing black stool and drop in H&H, will transfuse 1 unit PRBC to try to keep hemoglobin greater than 8, will follow H&H closely and transfuse as warranted. Aggressive fluid resuscitation is warranted as well. Patient is being transferred to stepdown. Continue Protonix Of note patient had EGD 7/25 which showed erythema in gastric mucosa but no obvious ulcers, biopsies pending. GI has consulted and is planning for possible push enteroscopy tomorrow and possible colonoscopy. Keep patient n.p.o.  HTN Hold all antihypertensives Hold aspirin  BPH Hold Flomax    DVT prophylaxis: SCD Code Status: Full Family Communication: Patient's wife was at bedside throughout Disposition Plan:   Patient is from: Home  Anticipated Discharge Location: Home  Barriers to Discharge: Ongoing GI bleed  Is patient medically stable for Discharge: Not yet   Scheduled Meds:  sodium chloride   Intravenous Once   atorvastatin  40 mg Oral QHS   famotidine  20 mg Oral BID   pantoprazole (PROTONIX) IV  40 mg Intravenous Q12H   Continuous Infusions:  lactated ringers 150 mL/hr at 11/21/21 1323    Data Reviewed:  Basic Metabolic Panel: Recent Labs  Lab 11/20/21 1707 11/21/21 0418  NA 140 139  K 4.3 4.4  CL 107 110  CO2 25 24  GLUCOSE 101* 108*  BUN 26* 43*  CREATININE 1.03 1.04  CALCIUM 8.7*  8.3*  MG  --  2.2    CBC: Recent Labs  Lab 11/20/21 1707 11/21/21 0418 11/21/21 1348  WBC 9.5 8.2  --   NEUTROABS  --  6.4  --   HGB 13.3 9.5* 7.8*  HCT 40.5 28.5* 23.3*  MCV 89.4 88.8  --   PLT 135* 126*  --     Studies: ECHOCARDIOGRAM COMPLETE  Result Date: 11/21/2021    ECHOCARDIOGRAM REPORT   Patient Name:   Jerry Farrell Date of Exam: 11/21/2021 Medical Rec #:  [id number]      Height:       67.0 in Accession #:    [id number]     Weight:       185.0 lb Date of Birth:  29-Dec-1945      BSA:          1.956 m Patient Age:    20 years        BP:           95/60 mmHg Patient Gender: M              HR:           87 bpm. Exam Location:  Inpatient Procedure: 2D Echo, Cardiac Doppler and Color Doppler Indications:     Syncope  History:         Patient has no prior history of Echocardiogram examinations.                  CAD.  Sonographer:     Jefferey Pica Referring Phys:  [id number] Rhetta Mura Diagnosing Phys: Gwyndolyn Kaufman MD IMPRESSIONS  1. Left ventricular ejection fraction, by estimation, is 70 to 75%. The left ventricle has normal function. The left ventricle has no regional wall motion abnormalities. There is severe hypertrophy of the LV septum measuring 1.9cm. The rest of the LV segments demonstrate moderate left ventricular hypertrophy. Left ventricular diastolic parameters are consistent with Grade I diastolic dysfunction (impaired relaxation). LV internal cavity appears small and underfilled. There is an intracavitary gradient measuring 26mHg without significant LVOT gradient/obstruction.  2. Right ventricular systolic function is normal. The right ventricular size is normal.  3. The mitral valve is normal in structure. No evidence of mitral valve regurgitation. No evidence of mitral stenosis.  4. The aortic valve is tricuspid. There is mild calcification of the aortic valve. There is mild thickening of the aortic valve. Aortic valve regurgitation is trivial. Aortic valve sclerosis/calcification is present, without any evidence of aortic stenosis.  5. Consider CMR to work-up for possible HCM given severe septal hypertrophy. Comparison(s): No prior Echocardiogram. FINDINGS  Left Ventricle: Left ventricular ejection fraction, by estimation, is 70 to 75%. The left ventricle has normal function. The left ventricle has no regional wall motion abnormalities. The left ventricular internal cavity size was small. There is severe hypertrophy of the LV septum measuring 1.9cm. The rest of the LV segments demonstrate moderate left ventricular  hypertrophy. Left ventricular diastolic parameters are consistent with Grade I diastolic dysfunction (impaired relaxation). Right Ventricle: The right ventricular size is normal. No increase in right ventricular wall thickness. Right ventricular systolic function is normal. Left Atrium: Left atrial size was normal in size. Right Atrium: Right atrial size was normal in size. Pericardium: There is no evidence of pericardial effusion. Mitral Valve: The mitral valve is normal in structure. No evidence of mitral valve regurgitation. No evidence of mitral valve stenosis. Tricuspid Valve: The tricuspid valve is normal in structure. Tricuspid valve  regurgitation is trivial. Aortic Valve: The aortic valve is tricuspid. There is mild calcification of the aortic valve. There is mild thickening of the aortic valve. Aortic valve regurgitation is trivial. Aortic valve sclerosis/calcification is present, without any evidence of aortic stenosis. Aortic valve peak gradient measures 6.7 mmHg. Pulmonic Valve: The pulmonic valve was not well visualized. Pulmonic valve regurgitation is trivial. Aorta: The aortic root and ascending aorta are structurally normal, with no evidence of dilitation. Venous: The inferior vena cava was not well visualized. IAS/Shunts: The atrial septum is grossly normal.  LEFT VENTRICLE PLAX 2D LVIDd:         1.95 cm   Diastology LVIDs:         1.10 cm   LV e' medial:    11.50 cm/s LV PW:         1.70 cm   LV E/e' medial:  3.5 LV IVS:        2.25 cm   LV e' lateral:   11.00 cm/s LVOT diam:     2.00 cm   LV E/e' lateral: 3.7 LV SV:         57 LV SV Index:   29 LVOT Area:     3.14 cm  RIGHT VENTRICLE RV S prime:     11.20 cm/s LEFT ATRIUM             Index        RIGHT ATRIUM           Index LA diam:        3.20 cm 1.64 cm/m   RA Area:     13.30 cm LA Vol (A2C):   31.7 ml 16.20 ml/m  RA Volume:   30.30 ml  15.49 ml/m LA Vol (A4C):   36.7 ml 18.76 ml/m LA Biplane Vol: 34.6 ml 17.69 ml/m  AORTIC VALVE                  PULMONIC VALVE AV Area (Vmax): 2.89 cm     PV Vmax:       1.16 m/s AV Vmax:        129.50 cm/s  PV Peak grad:  5.4 mmHg AV Peak Grad:   6.7 mmHg LVOT Vmax:      119.00 cm/s LVOT Vmean:     66.900 cm/s LVOT VTI:       0.180 m  AORTA Ao Root diam: 3.70 cm Ao Asc diam:  3.20 cm MITRAL VALVE MV Area (PHT): 4.46 cm    SHUNTS MV Decel Time: 170 msec    Systemic VTI:  0.18 m MV E velocity: 40.30 cm/s  Systemic Diam: 2.00 cm MV A velocity: 68.90 cm/s MV E/A ratio:  0.58 Gwyndolyn Kaufman MD Electronically signed by Gwyndolyn Kaufman MD Signature Date/Time: 11/21/2021/11:44:13 AM    Final (Updated)    VAS US CAROTID  Result Date: 11/21/2021 Carotid Arterial Duplex Study Patient Name:  LOXLEY SCHMALE  Date of Exam:   11/21/2021 Medical Rec #: [id number]       Accession #:    [id number] Date of Birth: 05/31/1945       Patient Gender: M Patient Age:   69 years Exam Location:  [facility] Procedure:      VAS US CAROTID Referring Phys: Babs Bertin --------------------------------------------------------------------------------  Indications:       Syncope. Risk Factors:      Hypertension. Comparison Study:  No prior studies. Performing Technologist: Oliver Hum RVT  Examination Guidelines: A complete  evaluation includes B-mode imaging, spectral Doppler, color Doppler, and power Doppler as needed of all accessible portions of each vessel. Bilateral testing is considered an integral part of a complete examination. Limited examinations for reoccurring indications may be performed as noted.  Right Carotid Findings: +----------+-------+--------+--------+---------------------+-------------------+           PSV    EDV cm/sStenosisPlaque Description   Comments                      cm/s                                                            +----------+-------+--------+--------+---------------------+-------------------+ CCA Prox  130    19              smooth and           High resistant  flow                                  heterogenous                             +----------+-------+--------+--------+---------------------+-------------------+ CCA Distal90     13              smooth and           High resistant flow                                  heterogenous                             +----------+-------+--------+--------+---------------------+-------------------+ ICA Prox  115    20              smooth and           High resistant flow                                  heterogenous                             +----------+-------+--------+--------+---------------------+-------------------+ ICA Distal112    27                                   Highl resistant                                                           flow                +----------+-------+--------+--------+---------------------+-------------------+ ECA       128    12                                                       +----------+-------+--------+--------+---------------------+-------------------+ +----------+--------+-------+--------+-------------------+  PSV cm/sEDV cmsDescribeArm Pressure (mmHG) +----------+--------+-------+--------+-------------------+ [id number]                                        +----------+--------+-------+--------+-------------------+ +---------+--------+--+--------+--+----------------------------+ VertebralPSV cm/s90EDV cm/s15High resistant and Antegrade +---------+--------+--+--------+--+----------------------------+  Left Carotid Findings: +----------+--------+--------+--------+------------------+---------------------+           PSV cm/sEDV cm/sStenosisPlaque DescriptionComments              +----------+--------+--------+--------+------------------+---------------------+ CCA Prox  188     31              smooth and        High resistant flow                                     heterogenous       noted in the proximal                                                     CCA.                  +----------+--------+--------+--------+------------------+---------------------+ CCA Distal154     26              smooth and        High resistant flow                                     heterogenous                            +----------+--------+--------+--------+------------------+---------------------+ ICA Prox  106     28              smooth and        High resistant flow                                     heterogenous                            +----------+--------+--------+--------+------------------+---------------------+ ICA Distal117     33                                High resistant flow   +----------+--------+--------+--------+------------------+---------------------+ ECA       98      6                                                       +----------+--------+--------+--------+------------------+---------------------+ +----------+--------+--------+--------+-------------------+           PSV cm/sEDV cm/sDescribeArm Pressure (mmHG) +----------+--------+--------+--------+-------------------+ Subclavian202                                         +----------+--------+--------+--------+-------------------+ +---------+--------+--+--------+-+----------------------------+  VertebralPSV cm/s53EDV cm/s5Antegrade and High resistant +---------+--------+--+--------+-+----------------------------+   Summary: Right Carotid: Velocities in the right ICA are consistent with a 1-39% stenosis. Left Carotid: Velocities in the left ICA are consistent with a 1-39% stenosis. Vertebrals: Bilateral vertebral arteries demonstrate high resistant flow. *See table(s) above for measurements and observations.     Preliminary    DG Chest 2 View  Result Date: 11/20/2021 CLINICAL DATA:  Chest pain. EXAM: CHEST - 2 VIEW COMPARISON:  Chest x-ray 09/12/2021  FINDINGS: Patient is status post cardiac surgery. The heart size and mediastinal contours are within normal limits. Both lungs are clear. The visualized skeletal structures are unremarkable. IMPRESSION: No active cardiopulmonary disease. Electronically Signed   By: Ronney Asters M.D.   On: 11/20/2021 17:32    Principal Problem:   Postural dizziness with presyncope Active Problems:   Dyslipidemia, goal LDL below 70   Essential hypertension   BPH (benign prostatic hyperplasia)   Dehydration   Acute prerenal azotemia   GI bleed     Vashti Hey, [facility]  If 7PM-7AM, please contact night-coverage [email or web address]   LOS: 0 days

## 2021-11-22 ENCOUNTER — Encounter (HOSPITAL_COMMUNITY): Payer: Self-pay | Admitting: Internal Medicine

## 2021-11-22 ENCOUNTER — Inpatient Hospital Stay (HOSPITAL_COMMUNITY): Payer: Medicare Other | Admitting: Anesthesiology

## 2021-11-22 ENCOUNTER — Encounter (HOSPITAL_COMMUNITY)
Admission: EM | Disposition: A | Discharge status: Home / Self Care | Payer: Self-pay | Source: Home / Self Care | Attending: Internal Medicine

## 2021-11-22 ENCOUNTER — Telehealth: Payer: Self-pay | Admitting: Physician Assistant

## 2021-11-22 DIAGNOSIS — I10 Essential (primary) hypertension: Secondary | ICD-10-CM

## 2021-11-22 DIAGNOSIS — K254 Chronic or unspecified gastric ulcer with hemorrhage: Secondary | ICD-10-CM

## 2021-11-22 DIAGNOSIS — R55 Syncope and collapse: Secondary | ICD-10-CM | POA: Diagnosis not present

## 2021-11-22 DIAGNOSIS — Q399 Congenital malformation of esophagus, unspecified: Secondary | ICD-10-CM

## 2021-11-22 DIAGNOSIS — Z980 Intestinal bypass and anastomosis status: Secondary | ICD-10-CM

## 2021-11-22 DIAGNOSIS — R42 Dizziness and giddiness: Secondary | ICD-10-CM | POA: Diagnosis not present

## 2021-11-22 HISTORY — PX: ENTEROSCOPY: SHX5533

## 2021-11-22 HISTORY — PX: HEMOSTASIS CLIP PLACEMENT: SHX6857

## 2021-11-22 LAB — BPAM RBC
Blood Product Expiration Date: 202308252359
ISSUE DATE / TIME: 202307311820
Unit Type and Rh: 7300

## 2021-11-22 LAB — TYPE AND SCREEN
ABO/RH(D): B POS
Antibody Screen: NEGATIVE
Unit division: 0

## 2021-11-22 LAB — CBC WITH DIFFERENTIAL/PLATELET
Abs Immature Granulocytes: 0.04 10*3/uL (ref 0.00–0.07)
Basophils Absolute: 0 10*3/uL (ref 0.0–0.1)
Basophils Relative: 0 %
Eosinophils Absolute: 0 10*3/uL (ref 0.0–0.5)
Eosinophils Relative: 0 %
HCT: 24.7 % — ABNORMAL LOW (ref 39.0–52.0)
Hemoglobin: 8.4 g/dL — ABNORMAL LOW (ref 13.0–17.0)
Immature Granulocytes: 0 %
Lymphocytes Relative: 13 %
Lymphs Abs: 1.6 10*3/uL (ref 0.7–4.0)
MCH: 30.1 pg (ref 26.0–34.0)
MCHC: 34 g/dL (ref 30.0–36.0)
MCV: 88.5 fL (ref 80.0–100.0)
Monocytes Absolute: 0.9 10*3/uL (ref 0.1–1.0)
Monocytes Relative: 8 %
Neutro Abs: 9.1 10*3/uL — ABNORMAL HIGH (ref 1.7–7.7)
Neutrophils Relative %: 79 %
Platelets: 104 10*3/uL — ABNORMAL LOW (ref 150–400)
RBC: 2.79 MIL/uL — ABNORMAL LOW (ref 4.22–5.81)
RDW: 13.6 % (ref 11.5–15.5)
WBC: 11.7 10*3/uL — ABNORMAL HIGH (ref 4.0–10.5)
nRBC: 0 % (ref 0.0–0.2)

## 2021-11-22 LAB — HEMOGLOBIN AND HEMATOCRIT, BLOOD
HCT: 27.5 % — ABNORMAL LOW (ref 39.0–52.0)
HCT: 26.5 % — ABNORMAL LOW (ref 39.0–52.0)
HCT: 25.9 % — ABNORMAL LOW (ref 39.0–52.0)
Hemoglobin: 9.3 g/dL — ABNORMAL LOW (ref 13.0–17.0)
Hemoglobin: 9.1 g/dL — ABNORMAL LOW (ref 13.0–17.0)
Hemoglobin: 8.8 g/dL — ABNORMAL LOW (ref 13.0–17.0)

## 2021-11-22 LAB — BASIC METABOLIC PANEL
Anion gap: 5 (ref 5–15)
BUN: 36 mg/dL — ABNORMAL HIGH (ref 8–23)
CO2: 24 mmol/L (ref 22–32)
Calcium: 8 mg/dL — ABNORMAL LOW (ref 8.9–10.3)
Chloride: 113 mmol/L — ABNORMAL HIGH (ref 98–111)
Creatinine, Ser: 0.91 mg/dL (ref 0.61–1.24)
GFR, Estimated: >60 mL/min (ref >60)
Glucose, Bld: 106 mg/dL — ABNORMAL HIGH (ref 70–99)
Potassium: 4.1 mmol/L (ref 3.5–5.1)
Sodium: 142 mmol/L (ref 135–145)

## 2021-11-22 LAB — GLUCOSE, CAPILLARY: Glucose-Capillary: 94 mg/dL (ref 70–99)

## 2021-11-22 SURGERY — ENTEROSCOPY
Anesthesia: Monitor Anesthesia Care

## 2021-11-22 MED ORDER — PROPOFOL 500 MG/50ML IV EMUL
INTRAVENOUS | Status: DC | PRN
  Administered 2021-11-22: 100 ug/kg/min via INTRAVENOUS

## 2021-11-22 MED ORDER — SUCRALFATE 1 GM/10ML PO SUSP
1.0000 g | Freq: Three times a day (TID) | ORAL | Status: DC
  Administered 2021-11-22 – 2021-11-24 (×8): 1 g via ORAL
  Filled 2021-11-22 (×8): qty 10

## 2021-11-22 MED ORDER — LIDOCAINE 2% (20 MG/ML) 5 ML SYRINGE
INTRAMUSCULAR | Status: DC | PRN
  Administered 2021-11-22: 80 mg via INTRAVENOUS

## 2021-11-22 MED ORDER — SODIUM CHLORIDE 0.9 % IV SOLN: INTRAVENOUS | Status: DC

## 2021-11-22 MED ORDER — PROPOFOL 10 MG/ML IV BOLUS
INTRAVENOUS | Status: DC | PRN
  Administered 2021-11-22: 40 mg via INTRAVENOUS

## 2021-11-22 MED ORDER — PHENYLEPHRINE 80 MCG/ML (10ML) SYRINGE FOR IV PUSH (FOR BLOOD PRESSURE SUPPORT)
PREFILLED_SYRINGE | INTRAVENOUS | Status: DC | PRN
  Administered 2021-11-22 (×5): 160 ug via INTRAVENOUS

## 2021-11-22 MED ORDER — LACTATED RINGERS IV SOLN: INTRAVENOUS | Status: DC

## 2021-11-22 NOTE — Anesthesia Postprocedure Evaluation (Signed)
Anesthesia Post Note  Patient: Jerry Farrell  Procedure(s) Performed: ENTEROSCOPY HEMOSTASIS CLIP PLACEMENT     Patient location during evaluation: Endoscopy Anesthesia Type: MAC Level of consciousness: awake and alert Pain management: pain level controlled Vital Signs Assessment: post-procedure vital signs reviewed and stable Respiratory status: spontaneous breathing, nonlabored ventilation, respiratory function stable and patient connected to nasal cannula oxygen Cardiovascular status: blood pressure returned to baseline and stable Postop Assessment: no apparent nausea or vomiting Anesthetic complications: no   No notable events documented.  Last Vitals:  Vitals:   11/22/21 1200 11/22/21 1352  BP: (!) 140/61 (!) 133/58  Pulse: 69 74  Resp: 16 16  Temp:  36.8 C  SpO2: 96% 98%    Last Pain:  Vitals:   11/22/21 1352  TempSrc: Oral  PainSc:                  [place]

## 2021-11-22 NOTE — Progress Notes (Signed)
Pt stable at time of transfer. Wife & son at beside. Report given

## 2021-11-22 NOTE — Transfer of Care (Signed)
Immediate Anesthesia Transfer of Care Note  Patient: MEGAN HAYDUK  Procedure(s) Performed: ENTEROSCOPY HEMOSTASIS CLIP PLACEMENT  Patient Location: PACU  Anesthesia Type:MAC  Level of Consciousness: sedated  Airway & Oxygen Therapy: Patient Spontanous Breathing and Patient connected to face mask oxygen  Post-op Assessment: Report given to RN and Post -op Vital signs reviewed and stable, treated initital pressure in recovery. Documented on OR record.  Post vital signs: Reviewed and stable  Last Vitals:  Vitals Value Taken Time  BP    Temp    Pulse 69 11/22/21 1058  Resp 13 11/22/21 1058  SpO2 100 % 11/22/21 1058  Vitals shown include unvalidated device data.  Last Pain:  Vitals:   11/22/21 0923  TempSrc: Temporal  PainSc: 0-No pain         Complications: No notable events documented.

## 2021-11-22 NOTE — Telephone Encounter (Addendum)
Dr. Percival Spanish (DOD) fielded a call from Dr. Sabino Gasser about this patient. Per their discussion, Dr. Percival Spanish feels he can be seen as OP in close f/u - the patient already had a visit scheduled 8/14 with Dr. Gwenlyn Found and Dr. Percival Spanish recommends to keep this. Appt info outlined on AVS.  The pt also appears to have a second appt scheduled in September as well. Will defer to Dr. Gwenlyn Found in follow-up whether or not he wants to keep that scheduled.

## 2021-11-22 NOTE — Progress Notes (Addendum)
Progress Note    TAHSIN BENYO   MVE:[id number]  DOB: [birth year]  DOA: 11/20/2021     1 PCP: Abner Greenspan, MD  Initial CC: [facility] Course: [name] is a 76 year old male with PMH colonic polyps, CAD, BPH, GERD, HLD, HTN who presented with dizziness and lightheadedness. He had recently undergone EGD on 11/15/2021, and biopsies taken. He was noted to have a hemoglobin of 13.3 g/dL initially on admission however after fluids hemoglobin had diluted down to 9.5 g/dL the following morning.  He was evaluated by GI and admitted for further work-up. He underwent repeat EGD on 11/22/2021 which revealed clotted blood in the gastric body/fundus and multiple nonbleeding gastric ulcers.  He was also started on twice daily PPI on admission.  Interval History:  Seen in his room this afternoon after returning from EGD.  He had no concerns or complaints.  Wife and son were present bedside as well.  Assessment and Plan:  GI bleed -Recent EGD performed 11/15/2021 with gastric biopsies taken - Repeat EGD performed on 11/22/2021 revealed clotted blood in the gastric body/fundus and multiple gastric ulcers treated with hemostatic clips - Continue twice daily PPI - GI following, greatly appreciate assistance - Full liquid diet started after EGD - Continue trending hemoglobin  Presyncope -Underwent work-up on admission - Some etiology considered due to hemoglobin drop from GI bleed as noted - Carotid ultrasound negative for ICA stenosis -Echo reviewed and notes severe hypertrophy of the LV septum; LV internal cavity "appears small and underfilled" - will discuss findings with cardiology to determine if any further workup needed inpt vs outpt   HTN Hold all antihypertensives Hold aspirin   BPH Hold Flomax     Old records reviewed in assessment of this patient  Antimicrobials:   DVT prophylaxis:  SCDs Start: 11/20/21 2053   Code Status:   Code Status: Full Code  Mobility  Assessment (last 72 hours)     Mobility Assessment     Row Name 11/21/21 1324           Does patient have an order for bedrest or is patient medically unstable No - Continue assessment       What is the highest level of mobility based on the progressive mobility assessment? Level 4 (Walks with assist in room) - Balance while marching in place and cannot step forward and back - Complete                Disposition Plan: Home Wednesday Status is: Inpatient  Objective: Blood pressure (!) 140/61, pulse 69, temperature 98.5 F (36.9 C), temperature source Temporal, resp. rate 16, height '5\' 7"'$  (1.702 m), weight 86.4 kg, SpO2 96 %.  Examination:  Physical Exam Constitutional:      General: He is not in acute distress.    Appearance: Normal appearance.  HENT:     Head: Normocephalic and atraumatic.     Mouth/Throat:     Mouth: Mucous membranes are moist.  Eyes:     Extraocular Movements: Extraocular movements intact.  Cardiovascular:     Rate and Rhythm: Normal rate and regular rhythm.     Heart sounds: Normal heart sounds.  Pulmonary:     Effort: Pulmonary effort is normal. No respiratory distress.     Breath sounds: Normal breath sounds. No wheezing.  Abdominal:     General: Bowel sounds are normal. There is no distension.     Palpations: Abdomen is soft.     Tenderness: There  is no abdominal tenderness.  Musculoskeletal:        General: Normal range of motion.     Cervical back: Normal range of motion and neck supple.  Skin:    General: Skin is warm and dry.  Neurological:     General: No focal deficit present.     Mental Status: He is alert.  Psychiatric:        Mood and Affect: Mood normal.        Behavior: Behavior normal.      Consultants:  GI  Procedures:  EGD, 11/22/2021  Data Reviewed: Results for orders placed or performed during the hospital encounter of 11/20/21 (from the past 24 hour(s))  Hemoglobin and hematocrit, blood     Status: Abnormal    Collection Time: 11/21/21  1:48 PM  Result Value Ref Range   Hemoglobin 7.8 (L) 13.0 - 17.0 g/dL   HCT 23.3 (L) 39.0 - 52.0 %  Type and screen [place]     Status: None   Collection Time: 11/21/21  1:48 PM  Result Value Ref Range   ABO/RH(D) B POS    Antibody Screen NEG    Sample Expiration 11/24/2021,2359    Unit Number [id number]    Blood Component Type RED CELLS,LR    Unit division 00    Status of Unit ISSUED,FINAL    Transfusion Status OK TO TRANSFUSE    Crossmatch Result      Compatible Performed at [facility] [address] [place], [place] [address]   Prepare RBC (crossmatch)     Status: None   Collection Time: 11/21/21  4:00 PM  Result Value Ref Range   Order Confirmation      ORDER PROCESSED BY BLOOD BANK Performed at [facility] [address], [place], [place] [address]   CBC with Differential/Platelet     Status: Abnormal   Collection Time: 11/22/21 12:02 AM  Result Value Ref Range   WBC 11.7 (H) 4.0 - 10.5 K/uL   RBC 2.79 (L) 4.22 - 5.81 MIL/uL   Hemoglobin 8.4 (L) 13.0 - 17.0 g/dL   HCT 24.7 (L) 39.0 - 52.0 %   MCV 88.5 80.0 - 100.0 fL   MCH 30.1 26.0 - 34.0 pg   MCHC 34.0 30.0 - 36.0 g/dL   RDW 13.6 11.5 - 15.5 %   Platelets 104 (L) 150 - 400 K/uL   nRBC 0.0 0.0 - 0.2 %   Neutrophils Relative % 79 %   Neutro Abs 9.1 (H) 1.7 - 7.7 K/uL   Lymphocytes Relative 13 %   Lymphs Abs 1.6 0.7 - 4.0 K/uL   Monocytes Relative 8 %   Monocytes Absolute 0.9 0.1 - 1.0 K/uL   Eosinophils Relative 0 %   Eosinophils Absolute 0.0 0.0 - 0.5 K/uL   Basophils Relative 0 %   Basophils Absolute 0.0 0.0 - 0.1 K/uL   Immature Granulocytes 0 %   Abs Immature Granulocytes 0.04 0.00 - 0.07 K/uL  Basic metabolic panel     Status: Abnormal   Collection Time: 11/22/21  6:04 AM  Result Value Ref Range   Sodium 142 135 - 145 mmol/L   Potassium 4.1 3.5 - 5.1 mmol/L   Chloride 113 (H) 98 - 111 mmol/L   CO2 24 22 -  32 mmol/L   Glucose, Bld 106 (H) 70 - 99 mg/dL   BUN 36 (H) 8 - 23 mg/dL   Creatinine, Ser 0.91 0.61 -  1.24 mg/dL   Calcium 8.0 (L) 8.9 - 10.3 mg/dL   GFR, Estimated >60 >60 mL/min   Anion gap 5 5 - 15  Hemoglobin and hematocrit, blood     Status: Abnormal   Collection Time: 11/22/21  6:04 AM  Result Value Ref Range   Hemoglobin 9.3 (L) 13.0 - 17.0 g/dL   HCT 27.5 (L) 39.0 - 52.0 %  Hemoglobin and hematocrit, blood     Status: Abnormal   Collection Time: 11/22/21 12:39 PM  Result Value Ref Range   Hemoglobin 9.1 (L) 13.0 - 17.0 g/dL   HCT 26.5 (L) 39.0 - 52.0 %    I have Reviewed nursing notes, Vitals, and Lab results since pt's last encounter. Pertinent lab results : see above I have ordered test including BMP, CBC, Mg I have reviewed the last note from staff over past 24 hours I have discussed pt's care plan and test results with nursing staff, case manager   LOS: 1 day   Dwyane Dee, MD [facility] 11/22/2021, 1:30 PM

## 2021-11-22 NOTE — Hospital Course (Signed)
Jerry Farrell is a 76 year old male with PMH colonic polyps, CAD, BPH, GERD, HLD, HTN who presented with dizziness and lightheadedness. He had recently undergone EGD on 11/15/2021, and biopsies taken. He was noted to have a hemoglobin of 13.3 g/dL initially on admission however after fluids hemoglobin had diluted down to 9.5 g/dL the following morning.  He was evaluated by GI and admitted for further work-up. He underwent repeat EGD on 11/22/2021 which revealed clotted blood in the gastric body/fundus and multiple nonbleeding gastric ulcers.  He was also started on twice daily PPI on admission.

## 2021-11-22 NOTE — Op Note (Signed)
[facility] Patient Name: Jerry Farrell Procedure Date: 11/22/2021 MRN: [id number] Attending MD: Gladstone Pih. Candis Schatz , MD Date of Birth: Aug 20, 1945 CSN: [id number] Age: 76 Admit Type: Inpatient Procedure:                Small bowel enteroscopy Indications:              Melena. Patient underwent an outpatient EGD last                            week to evaluate chest pain. EGD at that time with                            gastric erythema, no ulcer or evidence of bleeding.                            Gastric biopsies taken, but no other interventions                            performed. Admitted with presyncope and several                            days of melenic stool, hgb drop from 13 to 9. Providers:                Gladstone Pih. Candis Schatz, MD, Burtis Junes, RN, Cherylynn Ridges, Technician, Marla Roe, CRNA Referring MD:              Medicines:                Monitored Anesthesia Care Complications:            No immediate complications. Estimated Blood Loss:     Estimated blood loss was minimal. Procedure:                Pre-Anesthesia Assessment:                           - Prior to the procedure, a History and Physical                            was performed, and patient medications and                            allergies were reviewed. The patient's tolerance of                            previous anesthesia was also reviewed. The risks                            and benefits of the procedure and the sedation                            options and risks were discussed with the patient.  All questions were answered, and informed consent                            was obtained. Prior Anticoagulants: The patient has                            taken no previous anticoagulant or antiplatelet                            agents. ASA Grade Assessment: III - A patient with                            severe systemic disease.  After reviewing the risks                            and benefits, the patient was deemed in                            satisfactory condition to undergo the procedure.                           After obtaining informed consent, the endoscope was                            passed under direct vision. Throughout the                            procedure, the patient's blood pressure, pulse, and                            oxygen saturations were monitored continuously. The                            PCF-H190TL ([id number]) Olympus slim colonoscope was                            introduced through the mouth and advanced to the                            proximal jejunum. The small bowel enteroscopy was                            accomplished without difficulty. The patient                            tolerated the procedure well. Scope In: Scope Out: Findings:      The distal esophagus was moderately tortuous.      The exam of the esophagus was otherwise normal.      A large amount of clotted blood was found in the gastric body/fundus.       This was not able to aspirated through the endoscope. A Jabier Mutton net was       used to remove the bulk of the clot. The mucosa underlying the clot was  unremarkable.      Four or five non-bleeding superficial gastric ulcers were found in the       gastric body. The largest lesion was 8 mm in largest dimension and a       visible vessel was present. The other ulcers had no stigmata. For       hemostasis, three hemostatic clips were successfully placed (MR       conditional). A fourth clip was placed, but subsequently became dislodge       during the clot removal. There was no bleeding at the end of the       procedure.      The exam of the stomach was otherwise normal.      Evidence of a patent Billroth I gastroduodenostomy was found. A gastric       pouch with a large size was found. The gastroduodenal anastomosis was       characterized by healthy  appearing mucosa. This was traversed.      There was no evidence of significant pathology in the entire examined       duodenum, other than small flecks of hematin.      There was no evidence of significant pathology in the proximal jejunum. Impression:               - Tortuous esophagus.                           - Clotted blood in the gastric body.                           - Non-bleeding gastric ulcers with a visible                            vessel. Clips (MR conditional) were placed. These                            ulcers were likely secondary to gastric biopsies                            taken July 25th. Further biopsies were not taken.                           - Patent Billroth I gastroduodenostomy was found,                            characterized by healthy appearing mucosa.                           - Normal examined duodenum.                           - The examined portion of the jejunum was normal.                           - No specimens collected. Moderate Sedation:      Not Applicable - Patient had care per Anesthesia. Recommendation:           - Return patient to hospital ward for ongoing care.                           -  Full liquid diet today. Can advance tomorrow if                            no evidence of bleeding.                           - Use Protonix (pantoprazole) 40 mg IV twice daily                            today. Would transition to PO Protonix tomorrow if                            no evidence of bleeding. Continue PO Protonix twice                            daily for 8 weeks total after discharge.                           - Use sucralfate suspension 1 gram PO QID for 2                            weeks. Procedure Code(s):        --- Professional ---                           [phone], Small intestinal endoscopy, enteroscopy                            beyond second portion of duodenum, not including                            ileum; with control of  bleeding (eg, injection,                            bipolar cautery, unipolar cautery, laser, heater                            probe, stapler, plasma coagulator) Diagnosis Code(s):        --- Professional ---                           Q39.9, Congenital malformation of esophagus,                            unspecified                           K92.2, Gastrointestinal hemorrhage, unspecified                           K25.4, Chronic or unspecified gastric ulcer with                            hemorrhage  Z98.0, Intestinal bypass and anastomosis status                           K92.1, Melena (includes Hematochezia) CPT copyright 2019 American Medical Association. All rights reserved. The codes documented in this report are preliminary and upon coder review may  be revised to meet current compliance requirements. Sherylann Vangorden E. Candis Schatz, MD 11/22/2021 11:14:41 AM This report has been signed electronically. Number of Addenda: 0

## 2021-11-22 NOTE — TOC Initial Note (Signed)
Transition of Care [facility]) - Initial/Assessment Note    Patient Details  Name: Jerry Farrell MRN: [id number] Date of Birth: 1946-03-22  Transition of Care [facility]) CM/SW Contact:    Leeroy Cha, RN Phone Number: 11/22/2021, 8:43 AM  Clinical Narrative:                  Transition of Care [facility]) Screening Note   Patient Details  Name: Jerry Farrell Date of Birth: Aug 06, 1945   Transition of Care [facility]) CM/SW Contact:    Leeroy Cha, RN Phone Number: 11/22/2021, 8:43 AM    Transition of Care Department [facility]) has reviewed patient and no TOC needs have been identified at this time. We will continue to monitor patient advancement through interdisciplinary progression rounds. If new patient transition needs arise, please place a TOC consult.    Expected Discharge Plan: Home/Self Care Barriers to Discharge: Continued Medical Work up   Patient Goals and CMS Choice Patient states their goals for this hospitalization and ongoing recovery are:: to go home CMS Medicare.gov Compare Post Acute Care list provided to:: Patient Choice offered to / list presented to : Patient  Expected Discharge Plan and Services Expected Discharge Plan: Home/Self Care   Discharge Planning Services: CM Consult   Living arrangements for the past 2 months: Single Family Home                                      Prior Living Arrangements/Services Living arrangements for the past 2 months: Single Family Home Lives with:: Spouse Patient language and need for interpreter reviewed:: Yes Do you feel safe going back to the place where you live?: Yes            Criminal Activity/Legal Involvement Pertinent to Current Situation/Hospitalization: No - Comment as needed  Activities of Daily Living Home Assistive Devices/Equipment: None ADL Screening (condition at time of admission) Patient's cognitive ability adequate to safely complete daily activities?: Yes Is the patient deaf or have  difficulty hearing?: Yes Does the patient have difficulty seeing, even when wearing glasses/contacts?: No Does the patient have difficulty concentrating, remembering, or making decisions?: No Patient able to express need for assistance with ADLs?: Yes Does the patient have difficulty dressing or bathing?: No Independently performs ADLs?: Yes (appropriate for developmental age) Does the patient have difficulty walking or climbing stairs?: No Weakness of Legs: None Weakness of Arms/Hands: None  Permission Sought/Granted                  Emotional Assessment Appearance:: Appears stated age Attitude/Demeanor/Rapport: Engaged Affect (typically observed): Calm Orientation: : Oriented to Place, Oriented to Self, Oriented to  Time, Oriented to Situation Alcohol / Substance Use: Not Applicable Psych Involvement: No (comment)  Admission diagnosis:  EKG abnormalities [R94.31] Near syncope [R55] Postural dizziness with presyncope [R42, R55] GI bleed [K92.2] Patient Active Problem List   Diagnosis Date Noted   Dehydration 11/21/2021   Acute prerenal azotemia 11/21/2021   GI bleed 11/21/2021   Postural dizziness with presyncope 11/20/2021   Right-sided thoracic back pain 09/20/2021   Pre-syncope 09/20/2021   CAD in native artery 12/02/2020   Chest pain of uncertain etiology    Thrombocytopenia ([place]) 10/07/2020   Pedal edema 03/28/2020   Gallstones 06/26/2019   Coronary artery disease 05/20/2019   Constipation 08/30/2018   Fatigue 08/30/2018   Hyponatremia 08/30/2018   Dizziness 08/26/2018  Peptic ulcer disease 06/14/2016   Iron deficiency anemia 06/14/2016   B12 deficiency 06/14/2016   Prediabetes 01/31/2016   Need for hepatitis C screening test 01/25/2016   Post-nasal drip 12/10/2015   GERD (gastroesophageal reflux disease) 07/20/2015   Routine general medical examination at a health care facility 10/28/2014   Elevated alkaline phosphatase level 10/28/2014   BPH (benign  prostatic hyperplasia) 04/29/2014   Prostate cancer screening 08/11/2013   Encounter for Medicare annual wellness exam 08/11/2013   Dyslipidemia, goal LDL below 70 01/09/2007   ERECTILE DYSFUNCTION 01/09/2007   Essential hypertension 01/09/2007   Hx of CABG 01/09/2007   ESOPHAGITIS 01/09/2007   PCP:  Abner Greenspan, MD Pharmacy:   [id number] [place] [id number]: [phone]: [phone]    Social Determinants of Health (SDOH) Interventions    Readmission Risk Interventions     No data to display

## 2021-11-23 DIAGNOSIS — R42 Dizziness and giddiness: Secondary | ICD-10-CM | POA: Diagnosis not present

## 2021-11-23 DIAGNOSIS — K254 Chronic or unspecified gastric ulcer with hemorrhage: Secondary | ICD-10-CM | POA: Diagnosis not present

## 2021-11-23 DIAGNOSIS — R55 Syncope and collapse: Secondary | ICD-10-CM | POA: Diagnosis not present

## 2021-11-23 LAB — HEMOGLOBIN AND HEMATOCRIT, BLOOD
HCT: 23 % — ABNORMAL LOW (ref 39.0–52.0)
HCT: 23.8 % — ABNORMAL LOW (ref 39.0–52.0)
HCT: 24 % — ABNORMAL LOW (ref 39.0–52.0)
HCT: 24.2 % — ABNORMAL LOW (ref 39.0–52.0)
HCT: 24.5 % — ABNORMAL LOW (ref 39.0–52.0)
Hemoglobin: 8 g/dL — ABNORMAL LOW (ref 13.0–17.0)
Hemoglobin: 8.1 g/dL — ABNORMAL LOW (ref 13.0–17.0)
Hemoglobin: 8.2 g/dL — ABNORMAL LOW (ref 13.0–17.0)
Hemoglobin: 8.4 g/dL — ABNORMAL LOW (ref 13.0–17.0)
Hemoglobin: 8.5 g/dL — ABNORMAL LOW (ref 13.0–17.0)

## 2021-11-23 LAB — BASIC METABOLIC PANEL
Anion gap: 6 (ref 5–15)
Anion gap: 6 (ref 5–15)
BUN: 14 mg/dL (ref 8–23)
BUN: 19 mg/dL (ref 8–23)
CO2: 24 mmol/L (ref 22–32)
CO2: 25 mmol/L (ref 22–32)
Calcium: 7.8 mg/dL — ABNORMAL LOW (ref 8.9–10.3)
Calcium: 7.9 mg/dL — ABNORMAL LOW (ref 8.9–10.3)
Chloride: 105 mmol/L (ref 98–111)
Chloride: 109 mmol/L (ref 98–111)
Creatinine, Ser: 0.86 mg/dL (ref 0.61–1.24)
Creatinine, Ser: 0.95 mg/dL (ref 0.61–1.24)
GFR, Estimated: >60 mL/min (ref >60)
GFR, Estimated: >60 mL/min (ref >60)
Glucose, Bld: 114 mg/dL — ABNORMAL HIGH (ref 70–99)
Glucose, Bld: 123 mg/dL — ABNORMAL HIGH (ref 70–99)
Potassium: 3.4 mmol/L — ABNORMAL LOW (ref 3.5–5.1)
Potassium: 3.6 mmol/L (ref 3.5–5.1)
Sodium: 136 mmol/L (ref 135–145)
Sodium: 139 mmol/L (ref 135–145)

## 2021-11-23 LAB — MAGNESIUM: Magnesium: 1.7 mg/dL (ref 1.7–2.4)

## 2021-11-23 MED ORDER — PANTOPRAZOLE SODIUM 40 MG PO TBEC
40.0000 mg | DELAYED_RELEASE_TABLET | Freq: Two times a day (BID) | ORAL | Status: DC
  Administered 2021-11-23 – 2021-11-24 (×2): 40 mg via ORAL
  Filled 2021-11-23 (×2): qty 1

## 2021-11-23 MED ORDER — TAMSULOSIN HCL 0.4 MG PO CAPS
0.4000 mg | ORAL_CAPSULE | Freq: Every day | ORAL | Status: DC
  Administered 2021-11-23: 0.4 mg via ORAL
  Filled 2021-11-23: qty 1

## 2021-11-23 NOTE — Progress Notes (Signed)
Progress Note    Jerry Farrell   QMG:[id number]  DOB: [birth year]  DOA: 11/20/2021     2 PCP: Jerry Greenspan, MD  Initial CC: Jerry Farrell Course: Jerry Farrell is a 76 year old male with PMH colonic polyps, CAD, BPH, GERD, HLD, HTN who presented with dizziness and lightheadedness. He had recently undergone EGD on 11/15/2021, and biopsies taken. He was noted to have a hemoglobin of 13.3 g/dL initially on admission however after fluids hemoglobin had diluted down to 9.5 g/dL the following morning.  He was evaluated by GI and admitted for further work-up. He underwent repeat EGD on 11/22/2021 which revealed clotted blood in the gastric body/fundus and multiple nonbleeding gastric ulcers.  He was also started on twice daily PPI on admission.  Interval History:  Dark stool noted in the toilet this morning.  He had an episode of feeling a little dizzy when standing.  No overt bleeding and he remains hemodynamically stable with stable hemoglobin as well.  Assessment and Plan:  GI bleed -Recent EGD performed 11/15/2021 with gastric biopsies taken - Repeat EGD performed on 11/22/2021 revealed clotted blood in the gastric body/fundus and multiple gastric ulcers treated with hemostatic clips - Continue twice daily PPI - GI following, greatly appreciate assistance - Advanced to soft diet today per GI - Continue trending hemoglobin -Possible DC home tomorrow if remaining stable  Presyncope -Underwent work-up on admission - Some etiology considered due to hemoglobin drop from GI bleed as noted - Carotid ultrasound negative for ICA stenosis -Echo reviewed and notes severe hypertrophy of the LV septum; LV internal cavity "appears small and underfilled" -Repeat orthostatic blood pressure  Septal hypertrophy - noted on echo for presyncope workup  - Discussed with cardiology on 11/22/2021.  Patient felt to be stable for outpatient follow-up with Jerry Farrell on 12/05/2021   HTN Hold all  antihypertensives Hold aspirin   BPH Hold Flomax     Old records reviewed in assessment of this patient  Antimicrobials:   DVT prophylaxis:  SCDs Start: 11/20/21 2053   Code Status:   Code Status: Full Code  Mobility Assessment (last 72 hours)     Mobility Assessment     Row Name 11/22/21 2200 11/21/21 1324         Does patient have an order for bedrest or is patient medically unstable No - Continue assessment No - Continue assessment      What is the highest level of mobility based on the progressive mobility assessment? Level 4 (Walks with assist in room) - Balance while marching in place and cannot step forward and back - Complete Level 4 (Walks with assist in room) - Balance while marching in place and cannot step forward and back - Complete               Disposition Plan: Home Thus Status is: Inpatient  Objective: Blood pressure 128/66, pulse 77, temperature 98.6 F (37 C), temperature source Oral, resp. rate 18, height '5\' 7"'$  (1.702 m), weight 80.3 kg, SpO2 98 %.  Examination:  Physical Exam Constitutional:      General: He is not in acute distress.    Appearance: Normal appearance.  HENT:     Head: Normocephalic and atraumatic.     Mouth/Throat:     Mouth: Mucous membranes are moist.  Eyes:     Extraocular Movements: Extraocular movements intact.  Cardiovascular:     Rate and Rhythm: Normal rate and regular rhythm.     Heart sounds:  Normal heart sounds.  Pulmonary:     Effort: Pulmonary effort is normal. No respiratory distress.     Breath sounds: Normal breath sounds. No wheezing.  Abdominal:     General: Bowel sounds are normal. There is no distension.     Palpations: Abdomen is soft.     Tenderness: There is no abdominal tenderness.  Musculoskeletal:        General: Normal range of motion.     Cervical back: Normal range of motion and neck supple.  Skin:    General: Skin is warm and dry.  Neurological:     General: No focal deficit present.      Mental Status: He is alert.  Psychiatric:        Mood and Affect: Mood normal.        Behavior: Behavior normal.      Consultants:  GI  Procedures:  EGD, 11/22/2021  Data Reviewed: Results for orders placed or performed during the Farrell encounter of 11/20/21 (from the past 24 hour(s))  Hemoglobin and hematocrit, blood     Status: Abnormal   Collection Time: 11/22/21  7:26 PM  Result Value Ref Range   Hemoglobin 8.8 (L) 13.0 - 17.0 g/dL   HCT 25.9 (L) 39.0 - 52.0 %  Glucose, capillary     Status: None   Collection Time: 11/22/21  9:15 PM  Result Value Ref Range   Glucose-Capillary 94 70 - 99 mg/dL  Hemoglobin and hematocrit, blood     Status: Abnormal   Collection Time: 11/23/21 12:37 AM  Result Value Ref Range   Hemoglobin 8.0 (L) 13.0 - 17.0 g/dL   HCT 23.0 (L) 39.0 - 78.2 %  Basic metabolic panel     Status: Abnormal   Collection Time: 11/23/21 12:37 AM  Result Value Ref Range   Sodium 139 135 - 145 mmol/L   Potassium 3.6 3.5 - 5.1 mmol/L   Chloride 109 98 - 111 mmol/L   CO2 24 22 - 32 mmol/L   Glucose, Bld 114 (H) 70 - 99 mg/dL   BUN 19 8 - 23 mg/dL   Creatinine, Ser 0.86 0.61 - 1.24 mg/dL   Calcium 7.8 (L) 8.9 - 10.3 mg/dL   GFR, Estimated >60 >60 mL/min   Anion gap 6 5 - 15  Hemoglobin and hematocrit, blood     Status: Abnormal   Collection Time: 11/23/21  6:05 AM  Result Value Ref Range   Hemoglobin 8.1 (L) 13.0 - 17.0 g/dL   HCT 23.8 (L) 39.0 - 52.0 %  Hemoglobin and hematocrit, blood     Status: Abnormal   Collection Time: 11/23/21 11:58 AM  Result Value Ref Range   Hemoglobin 8.5 (L) 13.0 - 17.0 g/dL   HCT 24.5 (L) 39.0 - 52.0 %    I have Reviewed nursing notes, Vitals, and Lab results since pt's last encounter. Pertinent lab results : see above I have ordered test including BMP, CBC, Mg I have reviewed the last note from staff over past 24 hours I have discussed pt's care plan and test results with nursing staff, case manager   LOS: 2 days    Jerry Dee, MD [facility] 11/23/2021, 3:16 PM

## 2021-11-23 NOTE — Progress Notes (Addendum)
[facility] Progress Note  CC: Anemia, presyncope/hypotension  Subjective: He passed a small black stool around 7 PM last night.  No abdominal pain.  No nausea or vomiting.  He tolerated full liquid diet for breakfast.  No chest pain or shortness of breath.  Felt a little "woozy" when walking up in the hall. His wife is at the bedside.  Objective:   Small bowel enteroscopy 11/22/2021: - Tortuous esophagus. - Clotted blood in the gastric body. - Non-bleeding gastric ulcers with a visible vessel. Clips (MR conditional) were placed. These ulcers were likely secondary to gastric biopsies taken July 25th. Further biopsies were not taken. - Patent Billroth I gastroduodenostomy was found, characterized by healthy appearing mucosa. - Normal examined duodenum. - The examined portion of the jejunum was normal. - No specimens collected  Vital signs in last 24 hours: Temp:  [98.2 F (36.8 C)-98.8 F (37.1 C)] 98.4 F (36.9 C) (08/02 0432) Pulse Rate:  [68-93] 83 (08/02 0432) Resp:  [13-24] 18 (08/02 0432) BP: (90-154)/(34-71) 133/68 (08/02 0432) SpO2:  [96 %-100 %] 99 % (08/02 0432) Weight:  [80.3 kg] 80.3 kg (08/02 0434) Last BM Date : 11/21/21  General: 76 year old male alert in no acute distress Heart: Irregular rhythm, no murmurs Pulm: Breath sounds clear throughout Abdomen: Soft, nondistended.  Nontender.  Positive bowel sounds to all 4 quadrants Extremities:  Without edema. Neurologic:  Alert and  oriented x 4. Grossly normal neurologically. Psych:  Alert and cooperative. Normal mood and affect.  Intake/Output from previous day: 08/01 0701 - 08/02 0700 In: 2602.5 [P.O.:240; I.V.:2362.5] Out: 2550 [Urine:2550] Intake/Output this shift: No intake/output data recorded.  Lab Results: Recent Labs    11/20/21 1707 11/21/21 0418 11/21/21 1348 11/22/21 0002 11/22/21 0604 11/22/21 1926 11/23/21 0037 11/23/21 0605  WBC 9.5 8.2  --  11.7*  --   --   --    --   HGB 13.3 9.5*   < > 8.4*   < > 8.8* 8.0* 8.1*  HCT 40.5 28.5*   < > 24.7*   < > 25.9* 23.0* 23.8*  PLT 135* 126*  --  104*  --   --   --   --    < > = values in this interval not displayed.   BMET Recent Labs    11/21/21 0418 11/22/21 0604 11/23/21 0037  NA 139 142 139  K 4.4 4.1 3.6  CL 110 113* 109  CO2 '24 24 24  '$ GLUCOSE 108* 106* 114*  BUN 43* 36* 19  CREATININE 1.04 0.91 0.86  CALCIUM 8.3* 8.0* 7.8*   LFT Recent Labs    11/21/21 0418  PROT 4.9*  ALBUMIN 2.8*  AST 15  ALT 24  ALKPHOS 59  BILITOT 1.1   PT/INR No results for input(s): "LABPROT", "INR" in the last 72 hours. Hepatitis Panel No results for input(s): "HEPBSAG", "HCVAB", "HEPAIGM", "HEPBIGM" in the last 72 hours.  VAS US CAROTID  Result Date: 11/22/2021 Carotid Arterial Duplex Study Patient Name:  Jerry Farrell  Date of Exam:   11/21/2021 Medical Rec #: [id number]       Accession #:    [id number] Date of Birth: Jun 18, 1945       Patient Gender: M Patient Age:   1 years Exam Location:  [facility] Procedure:      VAS US CAROTID Referring Phys: Babs Bertin --------------------------------------------------------------------------------  Indications:       Syncope. Risk Factors:      Hypertension.  Comparison Study:  No prior studies. Performing Technologist: Oliver Hum RVT  Examination Guidelines: A complete evaluation includes B-mode imaging, spectral Doppler, color Doppler, and power Doppler as needed of all accessible portions of each vessel. Bilateral testing is considered an integral part of a complete examination. Limited examinations for reoccurring indications may be performed as noted.  Right Carotid Findings: +----------+-------+--------+--------+---------------------+-------------------+           PSV    EDV cm/sStenosisPlaque Description   Comments                      cm/s                                                             +----------+-------+--------+--------+---------------------+-------------------+ CCA Prox  130    19              smooth and           High resistant flow                                  heterogenous                             +----------+-------+--------+--------+---------------------+-------------------+ CCA Distal90     13              smooth and           High resistant flow                                  heterogenous                             +----------+-------+--------+--------+---------------------+-------------------+ ICA Prox  115    20              smooth and           High resistant flow                                  heterogenous                             +----------+-------+--------+--------+---------------------+-------------------+ ICA Distal112    27                                   Highl resistant                                                           flow                +----------+-------+--------+--------+---------------------+-------------------+ ECA       128    12                                                       +----------+-------+--------+--------+---------------------+-------------------+ +----------+--------+-------+--------+-------------------+  PSV cm/sEDV cmsDescribeArm Pressure (mmHG) +----------+--------+-------+--------+-------------------+ [id number]                                        +----------+--------+-------+--------+-------------------+ +---------+--------+--+--------+--+----------------------------+ VertebralPSV cm/s90EDV cm/s15High resistant and Antegrade +---------+--------+--+--------+--+----------------------------+  Left Carotid Findings: +----------+--------+--------+--------+------------------+---------------------+           PSV cm/sEDV cm/sStenosisPlaque DescriptionComments               +----------+--------+--------+--------+------------------+---------------------+ CCA Prox  188     31              smooth and        High resistant flow                                     heterogenous      noted in the proximal                                                     CCA.                  +----------+--------+--------+--------+------------------+---------------------+ CCA Distal154     26              smooth and        High resistant flow                                     heterogenous                            +----------+--------+--------+--------+------------------+---------------------+ ICA Prox  106     28              smooth and        High resistant flow                                     heterogenous                            +----------+--------+--------+--------+------------------+---------------------+ ICA Distal117     33                                High resistant flow   +----------+--------+--------+--------+------------------+---------------------+ ECA       98      6                                                       +----------+--------+--------+--------+------------------+---------------------+ +----------+--------+--------+--------+-------------------+           PSV cm/sEDV cm/sDescribeArm Pressure (mmHG) +----------+--------+--------+--------+-------------------+ Subclavian202                                         +----------+--------+--------+--------+-------------------+ +---------+--------+--+--------+-+----------------------------+  VertebralPSV cm/s53EDV cm/s5Antegrade and High resistant +---------+--------+--+--------+-+----------------------------+   Summary: Right Carotid: Velocities in the right ICA are consistent with a 1-39% stenosis. Left Carotid: Velocities in the left ICA are consistent with a 1-39% stenosis. Vertebrals: Bilateral vertebral arteries demonstrate high resistant  flow. *See table(s) above for measurements and observations.  Electronically signed by Orlie Pollen on 11/22/2021 at 12:46:35 PM.    Final     Assessment / Plan:  76 year old male admitted to the hospital 11/20/2021 with presyncope, hypotension and anemia. Hg 13.3 -> 9.4 -> 7.8 -> transfused 1 unit of PRBCs -> Hg 8.4-> today Hg 8.0 -> 8.1 at 6am.  S/P endoscopy 11/15/2021 which showed normal esophagus, Billroth I with healthy-appearing mucosa, greater curvature clip, erythematous mucosa in stomach, normal duodenum. S/P small bowel enteroscopy 8/1 showed clotted blood in the gastric body, nonbleeding gastric ulcers with a visible vessel which was clipped, these ulcers were likely secondary to gastric biopsies taken during his 7/25 EGD.  Patient reported passing a small black stool yesterday evening.  Hemodynamically stable. -Advance to soft diet -Change Protonix to p.o. twice daily, to continue for a total of 8 weeks -Continue sucralfate suspension 1 g p.o. 4 times daily for 2-week -Continue to monitor the patient closely for GI bleed -CBC in a.m. -Await further recommendations per Dr. Candis Schatz  History of gastric ulcer 2018, previously treated H. pylori, past partial gastrectomy/Bilroth I in the 1980s  Personal history of adenomatous polyps. Colonoscopy 07/2015 4 mm tubular adenoma -No further colon polyp surveillance colonoscopy secondary to age   Coronary artery disease status post bypass 1 episode of brief chest pain, presyncopal episodes. Unremarkable EKG, negative D-dimer. ECHO LV EF 70 - 75%, aortic valve sclerosis/calcification without aortic stenosis      Principal Problem:   Postural dizziness with presyncope Active Problems:   Dyslipidemia, goal LDL below 70   Essential hypertension   BPH (benign prostatic hyperplasia)   Dehydration   Acute prerenal azotemia   GI bleed    LOS: 2 days   Noralyn Pick  11/23/2021,  10:20AM  --------------------------------------------------------------------  I have taken a history, reviewed the chart and examined the patient. I performed a substantive portion of this encounter, including complete performance of at least one of the key components, in conjunction with the APP. I agree with the APP's note, impression and recommendations  Patient stable overnight following EGD.  He had a small black stool last night which is not unexpected.  His hemoglobin is remained stable.  His BUN decreased nicely, and I suspect he is no longer bleeding, but I think it is reasonable to continue to observe him overnight. Agree with advancing diet, transitioning to p.o. Protonix and repeat CBC tomorrow morning  Aliviana Burdell E. Candis Schatz, MD [facility]

## 2021-11-24 DIAGNOSIS — R42 Dizziness and giddiness: Secondary | ICD-10-CM | POA: Diagnosis not present

## 2021-11-24 DIAGNOSIS — K254 Chronic or unspecified gastric ulcer with hemorrhage: Secondary | ICD-10-CM | POA: Diagnosis not present

## 2021-11-24 DIAGNOSIS — R55 Syncope and collapse: Secondary | ICD-10-CM | POA: Diagnosis not present

## 2021-11-24 LAB — HEMOGLOBIN AND HEMATOCRIT, BLOOD
HCT: 24.5 % — ABNORMAL LOW (ref 39.0–52.0)
Hemoglobin: 8.5 g/dL — ABNORMAL LOW (ref 13.0–17.0)

## 2021-11-24 LAB — IRON AND TIBC
Iron: 43 ug/dL — ABNORMAL LOW (ref 45–182)
Saturation Ratios: 16 % — ABNORMAL LOW (ref 17.9–39.5)
TIBC: 267 ug/dL (ref 250–450)
UIBC: 224 ug/dL

## 2021-11-24 LAB — FERRITIN: Ferritin: 25 ng/mL (ref 24–336)

## 2021-11-24 MED ORDER — PANTOPRAZOLE SODIUM 40 MG PO TBEC: 40.0000 mg | DELAYED_RELEASE_TABLET | Freq: Every day | ORAL | 1 refills | Status: DC

## 2021-11-24 MED ORDER — SUCRALFATE 1 GM/10ML PO SUSP: 1.0000 g | Freq: Three times a day (TID) | ORAL | 0 refills | Status: DC

## 2021-11-24 MED ORDER — ASPIRIN EC 81 MG PO TBEC: 81.0000 mg | DELAYED_RELEASE_TABLET | Freq: Every day | ORAL | 3 refills | Status: AC

## 2021-11-24 MED ORDER — SODIUM CHLORIDE 0.9 % IV SOLN
250.0000 mg | Freq: Once | INTRAVENOUS | Status: AC
  Administered 2021-11-24: 250 mg via INTRAVENOUS
  Filled 2021-11-24: qty 20

## 2021-11-24 MED ORDER — FERROUS SULFATE 325 (65 FE) MG PO TBEC: 325.0000 mg | DELAYED_RELEASE_TABLET | Freq: Every day | ORAL | Status: AC

## 2021-11-24 MED ORDER — PANTOPRAZOLE SODIUM 40 MG PO TBEC: 40.0000 mg | DELAYED_RELEASE_TABLET | Freq: Two times a day (BID) | ORAL | 0 refills | Status: AC

## 2021-11-24 NOTE — Progress Notes (Addendum)
Jerry Farrell  CC: Anemia, presyncope/hypotension Primary GI: Dr. Fuller Plan  Subjective: Jerry Farrell states he had small-volume stools yesterday evening, states has not had any bowel movements overnight.  No abdominal pain, nausea or vomiting.  Jerry Farrell advanced diet to full diet and has done well.  Denies chest pain or shortness of breath.  States he feels better walking more steady on his feet  Objective:   Small bowel enteroscopy 11/22/2021: - Tortuous esophagus. - Clotted blood in the gastric body. - Non-bleeding gastric ulcers with a visible vessel. Clips (MR conditional) were placed. These ulcers were likely secondary to gastric biopsies taken July 25th. Further biopsies were not taken. - Patent Billroth I gastroduodenostomy was found, characterized by healthy appearing mucosa. - Normal examined duodenum. - The examined portion of the jejunum was normal. - No specimens collected  Vital signs in last 24 hours: Temp:  [98.3 F (36.8 C)-98.6 F (37 C)] 98.3 F (36.8 C) (08/03 0407) Pulse Rate:  [77-131] 100 (08/03 0809) Resp:  [18] 18 (08/03 0807) BP: (120-158)/(62-74) 137/67 (08/03 0808) SpO2:  [98 %-100 %] 100 % (08/03 0808) Weight:  [82.2 kg] 82.2 kg (08/03 0411) Last BM Date : 11/21/21  General: 76 year old male alert in no acute distress Heart: Irregular rhythm, no murmurs Pulm: Breath sounds clear throughout Abdomen: Soft, nondistended.  Nontender.  Positive bowel sounds to all 4 quadrants Extremities:  Without edema. Neurologic:  Alert and  oriented x 4. Grossly normal neurologically. Psych:  Alert and cooperative. Normal mood and affect.  Intake/Output from previous day: 08/02 0701 - 08/03 0700 In: 840 [P.O.:840] Out: 2175 [Urine:2175] Intake/Output this shift: No intake/output data recorded.  Lab Results: Recent Labs    11/22/21 0002 11/22/21 0604 11/23/21 1833 11/23/21 2318 11/24/21 0740  WBC 11.7*  --   --   --   --   HGB  8.4*   < > 8.2* 8.4* 8.5*  HCT 24.7*   < > 24.0* 24.2* 24.5*  PLT 104*  --   --   --   --    < > = values in this interval not displayed.   BMET Recent Labs    11/22/21 0604 11/23/21 0037 11/23/21 2318  NA 142 139 136  K 4.1 3.6 3.4*  CL 113* 109 105  CO2 '24 24 25  '$ GLUCOSE 106* 114* 123*  BUN 36* 19 14  CREATININE 0.91 0.86 0.95  CALCIUM 8.0* 7.8* 7.9*   LFT No results for input(s): "PROT", "ALBUMIN", "AST", "ALT", "ALKPHOS", "BILITOT", "BILIDIR", "IBILI" in the last 72 hours.  PT/INR No results for input(s): "LABPROT", "INR" in the last 72 hours. Hepatitis Panel No results for input(s): "HEPBSAG", "HCVAB", "HEPAIGM", "HEPBIGM" in the last 72 hours.  No results found.    Jerry Farrell profile:   76 year old male admitted to the hospitalist 11/20/2021 presyncope hypotension and anemia. 07/25 EGD showed normal esophagus, Billroth I with healthy-appearing mucosa, greater curvature clip, erythematous mucosa in stomach, normal duodenum pending biopsies. EGD biopsies negative for H. pylori, gastritis, negative for dysplasia   Impression/Plan:   Anemia with hypotension/presyncoe CBC on 11/22/2021   WBC 11.7 HGB 8.5 MCV 88.5 Platelets 104 S/P small bowel enteroscopy 8/1 showed clotted blood in the gastric body, nonbleeding gastric ulcers with a visible vessel which was clipped, these ulcers were likely secondary to gastric biopsies taken during his 7/25 EGD.  Hemoglobin stable, no signs of active GI bleeding at this time. We will get anemia panel today, may benefit from IV  iron  -Advance to soft diet -Protonix to p.o. twice daily, to continue for a total of 8 weeks -Continue sucralfate suspension 1 g p.o. 4 times daily for 2-week -We will need outpatient follow-up in our clinic in 6 to 8 weeks. -Await further recommendations per Dr. Candis Farrell  History of gastric ulcer 2018, previously treated H. pylori, past partial gastrectomy/Bilroth I in the 1980s  Personal history of  adenomatous polyps. Colonoscopy 07/2015 4 mm tubular adenoma -No further colon polyp surveillance colonoscopy secondary to age   Coronary artery disease status post bypass ECHO LV EF 70 - 75%, aortic valve sclerosis/calcification without aortic stenosis      LOS: 3 days   Jerry Farrell  11/24/2021, 10:20AM  ------------------------------------------------------------------------------------------  I have reviewed the chart but the Jerry Farrell was discharged home before I was able to see the Jerry Farrell.  I agree with the APP's Farrell, impression and recommendations  Jerry Cumbie E. Candis Schatz, MD [facility]

## 2021-11-24 NOTE — Plan of Care (Signed)
  Problem: Clinical Measurements: Goal: Ability to maintain clinical measurements within normal limits will improve Outcome: Progressing   Problem: Clinical Measurements: Goal: Diagnostic test results will improve Outcome: Progressing   Problem: Coping: Goal: Level of anxiety will decrease Outcome: Progressing

## 2021-11-24 NOTE — Care Management Important Message (Signed)
Important Message  Patient Details IM Letter given to the Patient. Name: Jerry Farrell MRN: [id number] Date of Birth: December 24, 1945   Medicare Important Message Given:  Yes     Kerin Salen 11/24/2021, 11:32 AM

## 2021-11-24 NOTE — Progress Notes (Signed)
Discharge instructions given to patient and all questions were answered.  

## 2021-11-24 NOTE — Discharge Instructions (Addendum)
Follow up with gastroenterology after discharge  Follow up with Dr. Gwenlyn Found on 12/05/21 regarding the echo that was performed in the hospital   Hold aspirin for 2 weeks

## 2021-11-24 NOTE — Discharge Summary (Signed)
Physician Discharge Summary   Jerry Farrell NKN:[id number] DOB: 1945/09/30 DOA: 11/20/2021  PCP: Abner Greenspan, MD  Admit date: 11/20/2021 Discharge date:  11/24/2021  Admitted From: Home Disposition:  Home Discharging physician: Dwyane Dee, MD  Recommendations for Outpatient Follow-up:  Follow up with GI Follow up with cardiology regarding echo   Home Health:  Equipment/Devices:   Discharge Condition: stable CODE STATUS: Full Diet recommendation:  Diet Orders (From admission, onward)     Start     Ordered   11/23/21 1017  DIET SOFT Room service appropriate? Yes; Fluid consistency: Thin  Diet effective now       Question Answer Comment  Room service appropriate? Yes   Fluid consistency: Thin      11/23/21 1016            Hospital Course: Jerry Farrell is a 76 year old male with PMH colonic polyps, CAD, BPH, GERD, HLD, HTN who presented with dizziness and lightheadedness. He had recently undergone EGD on 11/15/2021, and biopsies taken. He was noted to have a hemoglobin of 13.3 g/dL initially on admission however after fluids hemoglobin had diluted down to 9.5 g/dL the following morning.  He was evaluated by GI and admitted for further work-up. He underwent repeat EGD on 11/22/2021 which revealed clotted blood in the gastric body/fundus and multiple nonbleeding gastric ulcers.  He was also started on twice daily PPI on admission.  Assessment and Plan:  GI bleed -Recent EGD performed 11/15/2021 with gastric biopsies taken - Repeat EGD performed on 11/22/2021 revealed clotted blood in the gastric body/fundus and multiple gastric ulcers treated with hemostatic clips - Continue twice daily PPI x 8 weeks then back to daily -Carafate continued at discharge for 2 weeks per GI recommendations - asa held for 2 weeks at discharge  -Tolerating soft diet  Iron deficiency anemia -Iron stores mildly low - IV dose of Ferrlecit given prior to discharge and continued on oral  iron -Repeat levels outpatient for follow-up in approximately 3 to 4 months   Presyncope -Underwent work-up on admission - Some etiology considered due to hemoglobin drop from GI bleed as noted - Carotid ultrasound negative for ICA stenosis -Echo reviewed and notes severe hypertrophy of the LV septum; LV internal cavity "appears small and underfilled" -Repeat orthostatic blood pressure   Septal hypertrophy - noted on echo for presyncope workup  - Discussed with cardiology on 11/22/2021.  Patient felt to be stable for outpatient follow-up with Dr. Gwenlyn Found on 12/05/2021   HTN - resume home regimen    BPH Hold Flomax      The patient's chronic medical conditions were treated accordingly per the patient's home medication regimen except as noted.  On day of discharge, patient was felt deemed stable for discharge. Patient/family member advised to call PCP or come back to ER if needed.   Principal Diagnosis: Postural dizziness with presyncope  Discharge Diagnoses: Active Hospital Problems   Diagnosis Date Noted   Postural dizziness with presyncope 11/20/2021   Dehydration 11/21/2021   Acute prerenal azotemia 11/21/2021   GI bleed 11/21/2021   BPH (benign prostatic hyperplasia) 04/29/2014   Dyslipidemia, goal LDL below 70 01/09/2007   Essential hypertension 01/09/2007    Resolved Hospital Problems  No resolved problems to display.     Discharge Instructions     Increase activity slowly   Complete by: As directed       Allergies as of 11/24/2021   No Known Allergies      Medication List  TAKE these medications    acetaminophen 500 MG tablet Commonly known as: TYLENOL Take 1,000 mg by mouth every 6 (six) hours as needed for mild pain or headache.   aluminum hydroxide-magnesium carbonate 95-358 MG/15ML Susp Commonly known as: GAVISCON Take 15 mLs by mouth as needed for indigestion.   amLODipine 5 MG tablet Commonly known as: NORVASC Take 0.5 tablets (2.5 mg total)  by mouth 2 (two) times daily.   aspirin EC 81 MG tablet Take 1 tablet (81 mg total) by mouth daily.   atorvastatin 40 MG tablet Commonly known as: LIPITOR Take 1 tablet (40 mg total) by mouth daily. What changed: when to take this   carvedilol 3.125 MG tablet Commonly known as: COREG TAKE 1 TABLET BY MOUTH 2 TIMES DAILY WITH A MEAL.   doxazosin 4 MG tablet Commonly known as: CARDURA TAKE 1 TABLET EVERY DAY   famotidine 20 MG tablet Commonly known as: PEPCID TAKE 1 TABLET BY MOUTH TWICE A DAY   ferrous sulfate 325 (65 FE) MG EC tablet Take 1 tablet (325 mg total) by mouth daily with breakfast.   hydrALAZINE 25 MG tablet Commonly known as: APRESOLINE Take 1 tablet (25 mg total) by mouth 2 (two) times daily.   pantoprazole 40 MG tablet Commonly known as: PROTONIX Take 1 tablet (40 mg total) by mouth 2 (two) times daily. What changed: You were already taking a medication with the same name, and this prescription was added. Make sure you understand how and when to take each.   pantoprazole 40 MG tablet Commonly known as: PROTONIX Take 1 tablet (40 mg total) by mouth daily. Start taking on: January 20, 2022 What changed: These instructions start on January 20, 2022. If you are unsure what to do until then, ask your doctor or other care provider.   Robafen CF Multi-Symptom Cold 5-10-100 MG/5ML Liqd Generic drug: Phenylephrine-DM-GG Take 5 mLs by mouth daily as needed (cough).   sucralfate 1 GM/10ML suspension Commonly known as: CARAFATE Take 10 mLs (1 g total) by mouth 4 (four) times daily -  with meals and at bedtime for 11 days.   Systane Balance 0.6 % Soln Generic drug: Propylene Glycol Place 1 drop into both eyes 3 (three) times daily as needed (for dryness).   tamsulosin 0.4 MG Caps capsule Commonly known as: FLOMAX TAKE 1 CAPSULES BY MOUTH ONCE DAILY. What changed: See the new instructions.   valsartan 320 MG tablet Commonly known as: DIOVAN TAKE 1 TABLET  BY MOUTH EVERY DAY        Follow-up Information     Lorretta Harp, MD Follow up.   Specialties: Cardiology, Radiology Why: [facility] - keep follow-up as planned with Dr. Gwenlyn Found on Monday Dec 05, 2021 at 2:30 PM (Arrive by 2:15 PM). Contact information: [address] [place] [address] [phone]                No Known Allergies  Consultations: GI  Procedures: EGD 11/22/21  Discharge Exam: BP 137/67 (BP Location: Right Arm)   Pulse 100   Temp 98.3 F (36.8 C) (Oral)   Resp 18   Ht '5\' 7"'$  (1.702 m)   Wt 82.2 kg   SpO2 100%   BMI 28.38 kg/m  Physical Exam Constitutional:      General: He is not in acute distress.    Appearance: Normal appearance.  HENT:     Head: Normocephalic and atraumatic.     Mouth/Throat:  Mouth: Mucous membranes are moist.  Eyes:     Extraocular Movements: Extraocular movements intact.  Cardiovascular:     Rate and Rhythm: Normal rate and regular rhythm.     Heart sounds: Normal heart sounds.  Pulmonary:     Effort: Pulmonary effort is normal. No respiratory distress.     Breath sounds: Normal breath sounds. No wheezing.  Abdominal:     General: Bowel sounds are normal. There is no distension.     Palpations: Abdomen is soft.     Tenderness: There is no abdominal tenderness.  Musculoskeletal:        General: Normal range of motion.     Cervical back: Normal range of motion and neck supple.  Skin:    General: Skin is warm and dry.  Neurological:     General: No focal deficit present.     Mental Status: He is alert.  Psychiatric:        Mood and Affect: Mood normal.        Behavior: Behavior normal.      The results of significant diagnostics from this hospitalization (including imaging, microbiology, ancillary and laboratory) are listed below for reference.   Microbiology: No results found for this or any previous visit (from the past 240 hour(s)).   Labs: BNP (last 3 results) No results  for input(s): "BNP" in the last 8760 hours. Basic Metabolic Panel: Recent Labs  Lab 11/20/21 1707 11/21/21 0418 11/22/21 0604 11/23/21 0037 11/23/21 2318  NA 140 139 142 139 136  K 4.3 4.4 4.1 3.6 3.4*  CL 107 110 113* 109 105  CO2 '25 24 24 24 25  '$ GLUCOSE 101* 108* 106* 114* 123*  BUN 26* 43* 36* 19 14  CREATININE 1.03 1.04 0.91 0.86 0.95  CALCIUM 8.7* 8.3* 8.0* 7.8* 7.9*  MG  --  2.2  --   --  1.7   Liver Function Tests: Recent Labs  Lab 11/20/21 1707 11/21/21 0418  AST 20 15  ALT 34 24  ALKPHOS 84 59  BILITOT 1.3* 1.1  PROT 6.5 4.9*  ALBUMIN 3.7 2.8*   No results for input(s): "LIPASE", "AMYLASE" in the last 168 hours. No results for input(s): "AMMONIA" in the last 168 hours. CBC: Recent Labs  Lab 11/20/21 1707 11/21/21 0418 11/21/21 1348 11/22/21 0002 11/22/21 0604 11/23/21 0605 11/23/21 1158 11/23/21 1833 11/23/21 2318 11/24/21 0740  WBC 9.5 8.2  --  11.7*  --   --   --   --   --   --   NEUTROABS  --  6.4  --  9.1*  --   --   --   --   --   --   HGB 13.3 9.5*   < > 8.4*   < > 8.1* 8.5* 8.2* 8.4* 8.5*  HCT 40.5 28.5*   < > 24.7*   < > 23.8* 24.5* 24.0* 24.2* 24.5*  MCV 89.4 88.8  --  88.5  --   --   --   --   --   --   PLT 135* 126*  --  104*  --   --   --   --   --   --    < > = values in this interval not displayed.   Cardiac Enzymes: No results for input(s): "CKTOTAL", "CKMB", "CKMBINDEX", "TROPONINI" in the last 168 hours. BNP: Invalid input(s): "POCBNP" CBG: Recent Labs  Lab 11/21/21 1329 11/22/21 2115  GLUCAP 124* 94   D-Dimer No  results for input(s): "DDIMER" in the last 72 hours. Hgb A1c No results for input(s): "HGBA1C" in the last 72 hours. Lipid Profile No results for input(s): "CHOL", "HDL", "LDLCALC", "TRIG", "CHOLHDL", "LDLDIRECT" in the last 72 hours. Thyroid function studies No results for input(s): "TSH", "T4TOTAL", "T3FREE", "THYROIDAB" in the last 72 hours.  Invalid input(s): "FREET3" Anemia work up Recent Labs     11/24/21 0740  FERRITIN 25  TIBC 267  IRON 43*   Urinalysis    Component Value Date/Time   COLORURINE STRAW (A) 11/20/2021 2005   APPEARANCEUR CLEAR 11/20/2021 2005   LABSPEC 1.009 11/20/2021 2005   PHURINE 7.0 11/20/2021 2005   GLUCOSEU NEGATIVE 11/20/2021 2005   HGBUR NEGATIVE 11/20/2021 2005   BILIRUBINUR NEGATIVE 11/20/2021 2005   BILIRUBINUR Negative 03/26/2017 1601   KETONESUR 5 (A) 11/20/2021 2005   PROTEINUR NEGATIVE 11/20/2021 2005   UROBILINOGEN 0.2 03/26/2017 1601   NITRITE NEGATIVE 11/20/2021 2005   LEUKOCYTESUR NEGATIVE 11/20/2021 2005   Sepsis Labs Recent Labs  Lab 11/20/21 1707 11/21/21 0418 11/22/21 0002  WBC 9.5 8.2 11.7*   Microbiology No results found for this or any previous visit (from the past 240 hour(s)).  Procedures/Studies: VAS US CAROTID  Result Date: 11/22/2021 Carotid Arterial Duplex Study Patient Name:  Jerry Farrell  Date of Exam:   11/21/2021 Medical Rec #: [id number]       Accession #:    [id number] Date of Birth: [birth year]       Patient Gender: M Patient Age:   43 years Exam Location:  [facility] Procedure:      VAS US CAROTID Referring Phys: Babs Bertin --------------------------------------------------------------------------------  Indications:       Syncope. Risk Factors:      Hypertension. Comparison Study:  No prior studies. Performing Technologist: Oliver Hum RVT  Examination Guidelines: A complete evaluation includes B-mode imaging, spectral Doppler, color Doppler, and power Doppler as needed of all accessible portions of each vessel. Bilateral testing is considered an integral part of a complete examination. Limited examinations for reoccurring indications may be performed as noted.  Right Carotid Findings: +----------+-------+--------+--------+---------------------+-------------------+           PSV    EDV cm/sStenosisPlaque Description   Comments                      cm/s                                                             +----------+-------+--------+--------+---------------------+-------------------+ CCA Prox  130    19              smooth and           High resistant flow                                  heterogenous                             +----------+-------+--------+--------+---------------------+-------------------+ CCA Distal90     13              smooth and           High resistant flow  heterogenous                             +----------+-------+--------+--------+---------------------+-------------------+ ICA Prox  115    20              smooth and           High resistant flow                                  heterogenous                             +----------+-------+--------+--------+---------------------+-------------------+ ICA Distal112    27                                   Highl resistant                                                           flow                +----------+-------+--------+--------+---------------------+-------------------+ ECA       128    12                                                       +----------+-------+--------+--------+---------------------+-------------------+ +----------+--------+-------+--------+-------------------+           PSV cm/sEDV cmsDescribeArm Pressure (mmHG) +----------+--------+-------+--------+-------------------+ [id number]                                        +----------+--------+-------+--------+-------------------+ +---------+--------+--+--------+--+----------------------------+ VertebralPSV cm/s90EDV cm/s15High resistant and Antegrade +---------+--------+--+--------+--+----------------------------+  Left Carotid Findings: +----------+--------+--------+--------+------------------+---------------------+           PSV cm/sEDV cm/sStenosisPlaque DescriptionComments               +----------+--------+--------+--------+------------------+---------------------+ CCA Prox  188     31              smooth and        High resistant flow                                     heterogenous      noted in the proximal                                                     CCA.                  +----------+--------+--------+--------+------------------+---------------------+ CCA Distal154     26              smooth and        High resistant flow  heterogenous                            +----------+--------+--------+--------+------------------+---------------------+ ICA Prox  106     28              smooth and        High resistant flow                                     heterogenous                            +----------+--------+--------+--------+------------------+---------------------+ ICA Distal117     33                                High resistant flow   +----------+--------+--------+--------+------------------+---------------------+ ECA       98      6                                                       +----------+--------+--------+--------+------------------+---------------------+ +----------+--------+--------+--------+-------------------+           PSV cm/sEDV cm/sDescribeArm Pressure (mmHG) +----------+--------+--------+--------+-------------------+ Subclavian202                                         +----------+--------+--------+--------+-------------------+ +---------+--------+--+--------+-+----------------------------+ VertebralPSV cm/s53EDV cm/s5Antegrade and High resistant +---------+--------+--+--------+-+----------------------------+   Summary: Right Carotid: Velocities in the right ICA are consistent with a 1-39% stenosis. Left Carotid: Velocities in the left ICA are consistent with a 1-39% stenosis. Vertebrals: Bilateral vertebral arteries demonstrate high resistant  flow. *See table(s) above for measurements and observations.  Electronically signed by Orlie Pollen on 11/22/2021 at 12:46:35 PM.    Final    ECHOCARDIOGRAM COMPLETE  Result Date: 11/21/2021    ECHOCARDIOGRAM REPORT   Patient Name:   Jerry Farrell Date of Exam: 11/21/2021 Medical Rec #:  [id number]      Height:       67.0 in Accession #:    [id number]     Weight:       185.0 lb Date of Birth:  Aug 10, 1945      BSA:          1.956 m Patient Age:    36 years       BP:           95/60 mmHg Patient Gender: M              HR:           87 bpm. Exam Location:  Inpatient Procedure: 2D Echo, Cardiac Doppler and Color Doppler Indications:     Syncope  History:         Patient has no prior history of Echocardiogram examinations.                  CAD.  Sonographer:     Jefferey Pica Referring Phys:  [id number] Rhetta Mura Diagnosing Phys: Gwyndolyn Kaufman MD IMPRESSIONS  1. Left ventricular ejection fraction, by estimation, is 70 to 75%. The left ventricle  has normal function. The left ventricle has no regional wall motion abnormalities. There is severe hypertrophy of the LV septum measuring 1.9cm. The rest of the LV segments demonstrate moderate left ventricular hypertrophy. Left ventricular diastolic parameters are consistent with Grade I diastolic dysfunction (impaired relaxation). LV internal cavity appears small and underfilled. There is an intracavitary gradient measuring 48mHg without significant LVOT gradient/obstruction.  2. Right ventricular systolic function is normal. The right ventricular size is normal.  3. The mitral valve is normal in structure. No evidence of mitral valve regurgitation. No evidence of mitral stenosis.  4. The aortic valve is tricuspid. There is mild calcification of the aortic valve. There is mild thickening of the aortic valve. Aortic valve regurgitation is trivial. Aortic valve sclerosis/calcification is present, without any evidence of aortic stenosis.  5. Consider CMR to work-up  for possible HCM given severe septal hypertrophy. Comparison(s): No prior Echocardiogram. FINDINGS  Left Ventricle: Left ventricular ejection fraction, by estimation, is 70 to 75%. The left ventricle has normal function. The left ventricle has no regional wall motion abnormalities. The left ventricular internal cavity size was small. There is severe hypertrophy of the LV septum measuring 1.9cm. The rest of the LV segments demonstrate moderate left ventricular hypertrophy. Left ventricular diastolic parameters are consistent with Grade I diastolic dysfunction (impaired relaxation). Right Ventricle: The right ventricular size is normal. No increase in right ventricular wall thickness. Right ventricular systolic function is normal. Left Atrium: Left atrial size was normal in size. Right Atrium: Right atrial size was normal in size. Pericardium: There is no evidence of pericardial effusion. Mitral Valve: The mitral valve is normal in structure. No evidence of mitral valve regurgitation. No evidence of mitral valve stenosis. Tricuspid Valve: The tricuspid valve is normal in structure. Tricuspid valve regurgitation is trivial. Aortic Valve: The aortic valve is tricuspid. There is mild calcification of the aortic valve. There is mild thickening of the aortic valve. Aortic valve regurgitation is trivial. Aortic valve sclerosis/calcification is present, without any evidence of aortic stenosis. Aortic valve peak gradient measures 6.7 mmHg. Pulmonic Valve: The pulmonic valve was not well visualized. Pulmonic valve regurgitation is trivial. Aorta: The aortic root and ascending aorta are structurally normal, with no evidence of dilitation. Venous: The inferior vena cava was not well visualized. IAS/Shunts: The atrial septum is grossly normal.  LEFT VENTRICLE PLAX 2D LVIDd:         1.95 cm   Diastology LVIDs:         1.10 cm   LV e' medial:    11.50 cm/s LV PW:         1.70 cm   LV E/e' medial:  3.5 LV IVS:        2.25 cm   LV e'  lateral:   11.00 cm/s LVOT diam:     2.00 cm   LV E/e' lateral: 3.7 LV SV:         57 LV SV Index:   29 LVOT Area:     3.14 cm  RIGHT VENTRICLE RV S prime:     11.20 cm/s LEFT ATRIUM             Index        RIGHT ATRIUM           Index LA diam:        3.20 cm 1.64 cm/m   RA Area:     13.30 cm LA Vol (A2C):   31.7 ml 16.20 ml/m  RA Volume:  30.30 ml  15.49 ml/m LA Vol (A4C):   36.7 ml 18.76 ml/m LA Biplane Vol: 34.6 ml 17.69 ml/m  AORTIC VALVE                 PULMONIC VALVE AV Area (Vmax): 2.89 cm     PV Vmax:       1.16 m/s AV Vmax:        129.50 cm/s  PV Peak grad:  5.4 mmHg AV Peak Grad:   6.7 mmHg LVOT Vmax:      119.00 cm/s LVOT Vmean:     66.900 cm/s LVOT VTI:       0.180 m  AORTA Ao Root diam: 3.70 cm Ao Asc diam:  3.20 cm MITRAL VALVE MV Area (PHT): 4.46 cm    SHUNTS MV Decel Time: 170 msec    Systemic VTI:  0.18 m MV E velocity: 40.30 cm/s  Systemic Diam: 2.00 cm MV A velocity: 68.90 cm/s MV E/A ratio:  0.58 Gwyndolyn Kaufman MD Electronically signed by Gwyndolyn Kaufman MD Signature Date/Time: 11/21/2021/11:44:13 AM    Final (Updated)    DG Chest 2 View  Result Date: 11/20/2021 CLINICAL DATA:  Chest pain. EXAM: CHEST - 2 VIEW COMPARISON:  Chest x-ray 09/12/2021 FINDINGS: Patient is status post cardiac surgery. The heart size and mediastinal contours are within normal limits. Both lungs are clear. The visualized skeletal structures are unremarkable. IMPRESSION: No active cardiopulmonary disease. Electronically Signed   By: Ronney Asters M.D.   On: 11/20/2021 17:32     Time coordinating discharge: Over 30 minutes    Dwyane Dee, MD  [facility] 11/24/2021, 12:09 PM

## 2021-11-25 ENCOUNTER — Emergency Department (HOSPITAL_COMMUNITY): Payer: Medicare Other

## 2021-11-25 ENCOUNTER — Telehealth: Payer: Self-pay

## 2021-11-25 ENCOUNTER — Emergency Department (HOSPITAL_COMMUNITY)
Admission: EM | Admit: 2021-11-25 | Discharge: 2021-11-25 | Disposition: A | Discharge status: Home / Self Care | Payer: Medicare Other | Attending: Student | Admitting: Student

## 2021-11-25 ENCOUNTER — Encounter (HOSPITAL_COMMUNITY): Payer: Self-pay

## 2021-11-25 ENCOUNTER — Other Ambulatory Visit: Payer: Self-pay

## 2021-11-25 DIAGNOSIS — I10 Essential (primary) hypertension: Secondary | ICD-10-CM | POA: Diagnosis not present

## 2021-11-25 DIAGNOSIS — R079 Chest pain, unspecified: Secondary | ICD-10-CM | POA: Diagnosis not present

## 2021-11-25 DIAGNOSIS — Z7982 Long term (current) use of aspirin: Secondary | ICD-10-CM | POA: Diagnosis not present

## 2021-11-25 DIAGNOSIS — Z79899 Other long term (current) drug therapy: Secondary | ICD-10-CM | POA: Insufficient documentation

## 2021-11-25 DIAGNOSIS — R55 Syncope and collapse: Secondary | ICD-10-CM | POA: Insufficient documentation

## 2021-11-25 DIAGNOSIS — R002 Palpitations: Secondary | ICD-10-CM | POA: Insufficient documentation

## 2021-11-25 DIAGNOSIS — I2510 Atherosclerotic heart disease of native coronary artery without angina pectoris: Secondary | ICD-10-CM | POA: Diagnosis not present

## 2021-11-25 DIAGNOSIS — R0602 Shortness of breath: Secondary | ICD-10-CM | POA: Insufficient documentation

## 2021-11-25 DIAGNOSIS — Z951 Presence of aortocoronary bypass graft: Secondary | ICD-10-CM | POA: Insufficient documentation

## 2021-11-25 LAB — CBC WITH DIFFERENTIAL/PLATELET
Abs Immature Granulocytes: 0.05 10*3/uL (ref 0.00–0.07)
Basophils Absolute: 0 10*3/uL (ref 0.0–0.1)
Basophils Relative: 0 %
Eosinophils Absolute: 0.1 10*3/uL (ref 0.0–0.5)
Eosinophils Relative: 1 %
HCT: 26 % — ABNORMAL LOW (ref 39.0–52.0)
Hemoglobin: 8.7 g/dL — ABNORMAL LOW (ref 13.0–17.0)
Immature Granulocytes: 1 %
Lymphocytes Relative: 10 %
Lymphs Abs: 0.8 10*3/uL (ref 0.7–4.0)
MCH: 30.3 pg (ref 26.0–34.0)
MCHC: 33.5 g/dL (ref 30.0–36.0)
MCV: 90.6 fL (ref 80.0–100.0)
Monocytes Absolute: 0.5 10*3/uL (ref 0.1–1.0)
Monocytes Relative: 5 %
Neutro Abs: 7.2 10*3/uL (ref 1.7–7.7)
Neutrophils Relative %: 83 %
Platelets: 164 10*3/uL (ref 150–400)
RBC: 2.87 MIL/uL — ABNORMAL LOW (ref 4.22–5.81)
RDW: 14.7 % (ref 11.5–15.5)
WBC: 8.6 10*3/uL (ref 4.0–10.5)
nRBC: 0.5 % — ABNORMAL HIGH (ref 0.0–0.2)

## 2021-11-25 LAB — COMPREHENSIVE METABOLIC PANEL
ALT: 34 U/L (ref 0–44)
AST: 28 U/L (ref 15–41)
Albumin: 3.7 g/dL (ref 3.5–5.0)
Alkaline Phosphatase: 72 U/L (ref 38–126)
Anion gap: 8 (ref 5–15)
BUN: 17 mg/dL (ref 8–23)
CO2: 25 mmol/L (ref 22–32)
Calcium: 8.6 mg/dL — ABNORMAL LOW (ref 8.9–10.3)
Chloride: 107 mmol/L (ref 98–111)
Creatinine, Ser: 1.17 mg/dL (ref 0.61–1.24)
GFR, Estimated: >60 mL/min (ref >60)
Glucose, Bld: 136 mg/dL — ABNORMAL HIGH (ref 70–99)
Potassium: 3.1 mmol/L — ABNORMAL LOW (ref 3.5–5.1)
Sodium: 140 mmol/L (ref 135–145)
Total Bilirubin: 1.1 mg/dL (ref 0.3–1.2)
Total Protein: 6 g/dL — ABNORMAL LOW (ref 6.5–8.1)

## 2021-11-25 LAB — TROPONIN I (HIGH SENSITIVITY)
Troponin I (High Sensitivity): 7 ng/L (ref <18)
Troponin I (High Sensitivity): 7 ng/L (ref <18)

## 2021-11-25 LAB — BRAIN NATRIURETIC PEPTIDE: B Natriuretic Peptide: 100.8 pg/mL — ABNORMAL HIGH (ref 0.0–100.0)

## 2021-11-25 MED ORDER — POTASSIUM CHLORIDE CRYS ER 20 MEQ PO TBCR
40.0000 meq | EXTENDED_RELEASE_TABLET | Freq: Once | ORAL | Status: AC
  Administered 2021-11-25: 40 meq via ORAL
  Filled 2021-11-25: qty 2

## 2021-11-25 MED ORDER — MAGNESIUM OXIDE -MG SUPPLEMENT 400 (240 MG) MG PO TABS
800.0000 mg | ORAL_TABLET | Freq: Once | ORAL | Status: AC
  Administered 2021-11-25: 800 mg via ORAL
  Filled 2021-11-25: qty 2

## 2021-11-25 MED ORDER — LACTATED RINGERS IV BOLUS
1000.0000 mL | Freq: Once | INTRAVENOUS | Status: AC
  Administered 2021-11-25: 1000 mL via INTRAVENOUS

## 2021-11-25 NOTE — Chronic Care Management (AMB) (Signed)
Chronic Care Management Pharmacy Assistant   Name: Jerry Farrell  MRN: [id number] DOB: 1945/11/11   Reason for Encounter: Hospital Follow Up   Non- CCM   Medications: Outpatient Encounter Medications as of 11/25/2021  Medication Sig   acetaminophen (TYLENOL) 500 MG tablet Take 1,000 mg by mouth every 6 (six) hours as needed for mild pain or headache.   aluminum hydroxide-magnesium carbonate (GAVISCON) 95-358 MG/15ML SUSP Take 15 mLs by mouth as needed for indigestion.   amLODipine (NORVASC) 5 MG tablet Take 0.5 tablets (2.5 mg total) by mouth 2 (two) times daily.   [START ON 12/09/2021] aspirin EC 81 MG tablet Take 1 tablet (81 mg total) by mouth daily.   atorvastatin (LIPITOR) 40 MG tablet Take 1 tablet (40 mg total) by mouth daily. (Patient taking differently: Take 40 mg by mouth at bedtime.)   carvedilol (COREG) 3.125 MG tablet TAKE 1 TABLET BY MOUTH 2 TIMES DAILY WITH A MEAL. (Patient taking differently: Take 3.125 mg by mouth 2 (two) times daily with a meal.)   doxazosin (CARDURA) 4 MG tablet TAKE 1 TABLET EVERY DAY (Patient taking differently: Take 4 mg by mouth daily.)   famotidine (PEPCID) 20 MG tablet TAKE 1 TABLET BY MOUTH TWICE A DAY (Patient taking differently: Take 20 mg by mouth 2 (two) times daily.)   ferrous sulfate 325 (65 FE) MG EC tablet Take 1 tablet (325 mg total) by mouth daily with breakfast.   hydrALAZINE (APRESOLINE) 25 MG tablet Take 1 tablet (25 mg total) by mouth 2 (two) times daily.   pantoprazole (PROTONIX) 40 MG tablet Take 1 tablet (40 mg total) by mouth 2 (two) times daily.   [START ON 01/20/2022] pantoprazole (PROTONIX) 40 MG tablet Take 1 tablet (40 mg total) by mouth daily.   Phenylephrine-DM-GG (ROBAFEN CF MULTI-SYMPTOM COLD) 5-10-100 MG/5ML LIQD Take 5 mLs by mouth daily as needed (cough).   sucralfate (CARAFATE) 1 GM/10ML suspension Take 10 mLs (1 g total) by mouth 4 (four) times daily -  with meals and at bedtime for 11 days.   SYSTANE BALANCE 0.6 %  SOLN Place 1 drop into both eyes 3 (three) times daily as needed (for dryness).   tamsulosin (FLOMAX) 0.4 MG CAPS capsule TAKE 1 CAPSULES BY MOUTH ONCE DAILY. (Patient taking differently: Take 0.4 mg by mouth at bedtime.)   valsartan (DIOVAN) 320 MG tablet TAKE 1 TABLET BY MOUTH EVERY DAY (Patient taking differently: Take 320 mg by mouth daily.)   No facility-administered encounter medications on file as of 11/25/2021.     Reviewed hospital notes for details of recent visit. Patient has been contacted by Transitions of Care team: No  Admitted to the ED on 11/25/21 -  currently there Discharge diagnosis (Principal Problem): chest pain- near syncope Discharge  11/25/21- [facility] Brief summary of hospital course: Patient seen emergency room for evaluation of palpitations, shortness of breath and presyncope-Of note, intermittent PVCs seen on the cardiac monitor and may explain the patient's intermittent palpitations.  Patient safe for discharge with outpatient follow-up   Medications that remain the same after Hospital Discharge:??  -All other medications will remain the same.      Reviewed hospital notes for details of recent visit. Patient has been contacted by Transitions of Care team: No  Admitted to the ED on 11/20/21. Discharge date was 11/24/21.  Discharged from [facility].   Discharge diagnosis (Principal Problem): Near syncope, Patient was discharged to Home  Brief summary of hospital  course: Subsequently, the patient was admitted for overnight observation for further evaluation management of single episode of presyncope.   New?Medications Started at [facility] Discharge:?? -Started Ferrous sulfate  sucralfate   Medication Changes at Hospital Discharge: -Changed aspirin EC  pantoprazole  Medications that remain the same after Hospital Discharge:??  -All other medications will remain the same.    Next CCM appt: none  Other upcoming appts: Cardiology appointment  on 12/05/21  Charlene Brooke, PharmD notified and will determine if action is needed.

## 2021-11-25 NOTE — ED Triage Notes (Signed)
Pt reports he was seen here yesterday for bleeding ulcers. Pt reports today he woke up feeling weak, SHOB, palpitations, and chest pain.

## 2021-11-25 NOTE — ED Provider Notes (Signed)
[facility] DEPT Provider Note  CSN: [id number] Arrival date & time: 11/25/21 1308  Chief Complaint(s) Chest Pain and Shortness of Breath  HPI Jerry Farrell is a 76 y.o. male with PMH CAD, H. pylori gastric ulcers with associated upper GI bleed recently released from the hospital yesterday who presents emergency department for evaluation of shortness of breath, palpitations and presyncope.  Chief complaint mentions chest pain but patient is not complaining of any chest pain.  He states that after leaving the hospital yesterday, he went to sleep, woke up and went to the bathroom.  Upon getting up to go to the bathroom, he had an episode of palpitations with a feeling like he was going to pass out.  He denies abdominal pain, nausea, vomiting, headache, fever or other systemic symptoms.  Here in the emergency department he states that his symptoms are no longer present.   Past Medical History Past Medical History:  Diagnosis Date   Adenomatous colon polyp    BPH (benign prostatic hyperplasia)    CAD (coronary artery disease)    cardiolite ok 3/05, stress test- low risk study 11/06   Chronic low back pain    with radiculopathy   ED (erectile dysfunction)    Gallstones    abd [place]- stable hamangioma, gallbladder sludge and tiny stones 09/2003   Gastritis    GERD (gastroesophageal reflux disease)    H pylori ulcer    Hyperlipidemia    Hypertension    Transfusion history    Patient Active Problem List   Diagnosis Date Noted   Dehydration 11/21/2021   Acute prerenal azotemia 11/21/2021   GI bleed 11/21/2021   Postural dizziness with presyncope 11/20/2021   Right-sided thoracic back pain 09/20/2021   Pre-syncope 09/20/2021   CAD in native artery 12/02/2020   Chest pain of uncertain etiology    Thrombocytopenia ([place]) 10/07/2020   Pedal edema 03/28/2020   Gallstones 06/26/2019   Coronary artery disease 05/20/2019   Constipation 08/30/2018   Fatigue  08/30/2018   Hyponatremia 08/30/2018   Dizziness 08/26/2018   Peptic ulcer disease 06/14/2016   Iron deficiency anemia 06/14/2016   B12 deficiency 06/14/2016   Prediabetes 01/31/2016   Need for hepatitis C screening test 01/25/2016   Post-nasal drip 12/10/2015   GERD (gastroesophageal reflux disease) 07/20/2015   Routine general medical examination at a health care facility 10/28/2014   Elevated alkaline phosphatase level 10/28/2014   BPH (benign prostatic hyperplasia) 04/29/2014   Prostate cancer screening 08/11/2013   Encounter for Medicare annual wellness exam 08/11/2013   Dyslipidemia, goal LDL below 70 01/09/2007   ERECTILE DYSFUNCTION 01/09/2007   Essential hypertension 01/09/2007   Hx of CABG 01/09/2007   ESOPHAGITIS 01/09/2007   Home Medication(s) Prior to Admission medications   Medication Sig Start Date End Date Taking? Authorizing Provider  acetaminophen (TYLENOL) 500 MG tablet Take 1,000 mg by mouth every 6 (six) hours as needed for mild pain or headache.    [provider]  aluminum hydroxide-magnesium carbonate (GAVISCON) 95-358 MG/15ML SUSP Take 15 mLs by mouth as needed for indigestion.    [provider]  amLODipine (NORVASC) 5 MG tablet Take 0.5 tablets (2.5 mg total) by mouth 2 (two) times daily. 01/03/21   Lorretta Harp, MD  aspirin EC 81 MG tablet Take 1 tablet (81 mg total) by mouth daily. 12/09/21   Dwyane Dee, MD  atorvastatin (LIPITOR) 40 MG tablet Take 1 tablet (40 mg total) by mouth daily. Patient taking differently:  Take 40 mg by mouth at bedtime. 02/02/21   Lorretta Harp, MD  carvedilol (COREG) 3.125 MG tablet TAKE 1 TABLET BY MOUTH 2 TIMES DAILY WITH A MEAL. Patient taking differently: Take 3.125 mg by mouth 2 (two) times daily with a meal. 11/16/20   Lorretta Harp, MD  doxazosin (CARDURA) 4 MG tablet TAKE 1 TABLET EVERY DAY Patient taking differently: Take 4 mg by mouth daily. 07/26/21   Lorretta Harp, MD  famotidine  (PEPCID) 20 MG tablet TAKE 1 TABLET BY MOUTH TWICE A DAY Patient taking differently: Take 20 mg by mouth 2 (two) times daily. 11/09/21   Tower, Wynelle Fanny, MD  ferrous sulfate 325 (65 FE) MG EC tablet Take 1 tablet (325 mg total) by mouth daily with breakfast. 11/24/21 11/24/22  Dwyane Dee, MD  hydrALAZINE (APRESOLINE) 25 MG tablet Take 1 tablet (25 mg total) by mouth 2 (two) times daily. 01/19/21 11/20/21  Lorretta Harp, MD  pantoprazole (PROTONIX) 40 MG tablet Take 1 tablet (40 mg total) by mouth 2 (two) times daily. 11/24/21 01/19/22  Dwyane Dee, MD  pantoprazole (PROTONIX) 40 MG tablet Take 1 tablet (40 mg total) by mouth daily. 01/20/22   Dwyane Dee, MD  Phenylephrine-DM-GG (ROBAFEN CF MULTI-SYMPTOM COLD) 5-10-100 MG/5ML LIQD Take 5 mLs by mouth daily as needed (cough).    [provider]  sucralfate (CARAFATE) 1 GM/10ML suspension Take 10 mLs (1 g total) by mouth 4 (four) times daily -  with meals and at bedtime for 11 days. 11/24/21 12/05/21  Dwyane Dee, MD  SYSTANE BALANCE 0.6 % SOLN Place 1 drop into both eyes 3 (three) times daily as needed (for dryness).    [provider]  tamsulosin (FLOMAX) 0.4 MG CAPS capsule TAKE 1 CAPSULES BY MOUTH ONCE DAILY. Patient taking differently: Take 0.4 mg by mouth at bedtime. 10/31/21   Tower, Wynelle Fanny, MD  valsartan (DIOVAN) 320 MG tablet TAKE 1 TABLET BY MOUTH EVERY DAY Patient taking differently: Take 320 mg by mouth daily. 09/05/21   Lorretta Harp, MD                                                                                                                                    Past Surgical History Past Surgical History:  Procedure Laterality Date   CATARACT EXTRACTION [facility]  03/13/2012   Procedure: CATARACT EXTRACTION PHACO AND INTRAOCULAR LENS PLACEMENT ([place]);  Surgeon: Adonis Brook, MD;  Location: [place];  Service: Ophthalmology;  Laterality: Left;   CORONARY ARTERY BYPASS GRAFT  1998   x 1  done here at [facility]    ENTEROSCOPY N/A 11/22/2021   Procedure: ENTEROSCOPY;  Surgeon: Daryel November, MD;  Location: [facility];  Service: Gastroenterology;  Laterality: N/A;   ESOPHAGOGASTRODUODENOSCOPY (EGD) WITH PROPOFOL N/A 06/07/2016   Procedure: ESOPHAGOGASTRODUODENOSCOPY (EGD) WITH PROPOFOL;  Surgeon: Mauri Pole, MD;  Location: [facility];  Service: Endoscopy;  Laterality: N/A;   GASTRECTOMY     partial   HEMOSTASIS CLIP PLACEMENT  11/22/2021   Procedure: HEMOSTASIS CLIP PLACEMENT;  Surgeon: Daryel November, MD;  Location: [facility];  Service: Gastroenterology;;   LEFT HEART CATH AND CORS/GRAFTS ANGIOGRAPHY N/A 12/02/2020   Procedure: LEFT HEART CATH AND CORS/GRAFTS ANGIOGRAPHY;  Surgeon: Lorretta Harp, MD;  Location: [place] [facility];  Service: Cardiovascular;  Laterality: N/A;   MEMBRANE PEEL  03/13/2012   Procedure: MEMBRANE PEEL;  Surgeon: Adonis Brook, MD;  Location: [place];  Service: Ophthalmology;  Laterality: Left;   PARS PLANA VITRECTOMY  03/13/2012   Procedure: PARS PLANA VITRECTOMY WITH 23 GAUGE;  Surgeon: Adonis Brook, MD;  Location: [place];  Service: Ophthalmology;  Laterality: Left;   Family History Family History  Problem Relation Age of Onset   Hypertension Mother    Throat cancer Brother        pt feels alcohol related   Colon cancer Brother        1/2 brother   Stomach cancer Neg Hx    Pancreatic cancer Neg Hx     Social History Social History   Tobacco Use   Smoking status: Never   Smokeless tobacco: Never  Vaping Use   Vaping Use: Never used  Substance Use Topics   Alcohol use: No    Alcohol/week: 0.0 standard drinks of alcohol   Drug use: No   Allergies Patient has no known allergies.  Review of Systems Review of Systems  Respiratory:  Positive for shortness of breath.   Cardiovascular:  Positive for palpitations.  Neurological:  Positive for syncope.    Physical Exam Vital Signs  I have reviewed the triage vital signs BP (!) 141/54    Pulse 79   Temp (!) 97.4 F (36.3 C) (Oral)   Resp 18   Ht '5\' 7"'$  (1.702 m)   Wt 82.2 kg   SpO2 99%   BMI 28.38 kg/m   Physical Exam Constitutional:      General: He is not in acute distress.    Appearance: Normal appearance.  HENT:     Head: Normocephalic and atraumatic.     Nose: No congestion or rhinorrhea.  Eyes:     General:        Right eye: No discharge.        Left eye: No discharge.     Extraocular Movements: Extraocular movements intact.     Pupils: Pupils are equal, round, and reactive to light.  Cardiovascular:     Rate and Rhythm: Normal rate and regular rhythm.     Heart sounds: No murmur heard. Pulmonary:     Effort: No respiratory distress.     Breath sounds: No wheezing or rales.  Abdominal:     General: There is no distension.     Tenderness: There is no abdominal tenderness.  Musculoskeletal:        General: Normal range of motion.     Cervical back: Normal range of motion.  Skin:    General: Skin is warm and dry.  Neurological:     General: No focal deficit present.     Mental Status: He is alert.     ED Results and Treatments Labs (all labs ordered are listed, but only abnormal results are displayed) Labs Reviewed  COMPREHENSIVE METABOLIC PANEL - Abnormal; Notable for the following components:      Result Value   Potassium 3.1 (*)    Glucose, Bld 136 (*)  Calcium 8.6 (*)    Total Protein 6.0 (*)    All other components within normal limits  CBC WITH DIFFERENTIAL/PLATELET - Abnormal; Notable for the following components:   RBC 2.87 (*)    Hemoglobin 8.7 (*)    HCT 26.0 (*)    nRBC 0.5 (*)    All other components within normal limits  BRAIN NATRIURETIC PEPTIDE - Abnormal; Notable for the following components:   B Natriuretic Peptide 100.8 (*)    All other components within normal limits  TROPONIN I (HIGH SENSITIVITY)  TROPONIN I (HIGH SENSITIVITY)                                                                                                                           Radiology DG Chest 2 View  Result Date: 11/25/2021 CLINICAL DATA:  Chest pain EXAM: CHEST - 2 VIEW COMPARISON:  11/20/2021 chest radiograph. FINDINGS: Intact sternotomy wires. CABG clips overlie the mediastinum. Stable cardiomediastinal silhouette with normal heart size. No pneumothorax. No pleural effusion. Mild elevation of the left hemidiaphragm. No pulmonary edema. No acute consolidative airspace disease. IMPRESSION: No active cardiopulmonary disease. Mild elevation of the left hemidiaphragm. Electronically Signed   By: Ilona Sorrel M.D.   On: 11/25/2021 08:00    Pertinent labs & imaging results that were available during my care of the patient were reviewed by me and considered in my medical decision making (see MDM for details).  Medications Ordered in ED Medications  potassium chloride SA (KLOR-CON M) CR tablet 40 mEq (40 mEq Oral Given 11/25/21 0926)  magnesium oxide (MAG-OX) tablet 800 mg (800 mg Oral Given 11/25/21 0925)  lactated ringers bolus 1,000 mL (0 mLs Intravenous Stopped 11/25/21 1043)                                                                                                                                     Procedures Procedures  (including critical care time)  Medical Decision Making / ED Course   This patient presents to the ED for concern of presyncope, palpitations shortness of breath, this involves an extensive number of treatment options, and is a complaint that carries with it a high risk of complications and morbidity.  The differential diagnosis includes vagal presyncope, orthostatic presyncope, pneumonia, ACS  MDM: Patient seen emergency room for evaluation of palpitations, shortness of breath and presyncope.  Physical exam largely unremarkable.  An with a hemoglobin of 8.7 which is stable from patient's hospital stay.  Mild hypokalemia to 3.1 which was repleted here in the emergency department.  BNP mildly elevated to 100.8  but chest x-ray with no evidence of pulmonary edema.  Patient clinically does not appear fluid overloaded either.  He was given a liter of fluids and orthostatic vital signs were obtained as well as a walking trial and the patient only had a very mild elevation of his heart rate to 104 which is acceptable in the setting of active exercise and return to normal heart rate upon rest.  He had no recurrence of his presyncopal symptoms as well as no chest pain or shortness of breath.  Patient will reach out to his primary care physician for follow-up.  ECG nonischemic and initial troponin is normal.  Of note, intermittent PVCs seen on the cardiac monitor and may explain the patient's intermittent palpitations.  Patient safe for discharge with outpatient follow-up.  Low suspicion for pulmonary embolism given no tachycardia, no pleurisy, and no shortness of breath here in the emergency department.   Additional history obtained: -Additional history obtained from wife -External records from outside source obtained and reviewed including: Chart review including previous notes, labs, imaging, consultation notes   Lab Tests: -I ordered, reviewed, and interpreted labs.   The pertinent results include:   Labs Reviewed  COMPREHENSIVE METABOLIC PANEL - Abnormal; Notable for the following components:      Result Value   Potassium 3.1 (*)    Glucose, Bld 136 (*)    Calcium 8.6 (*)    Total Protein 6.0 (*)    All other components within normal limits  CBC WITH DIFFERENTIAL/PLATELET - Abnormal; Notable for the following components:   RBC 2.87 (*)    Hemoglobin 8.7 (*)    HCT 26.0 (*)    nRBC 0.5 (*)    All other components within normal limits  BRAIN NATRIURETIC PEPTIDE - Abnormal; Notable for the following components:   B Natriuretic Peptide 100.8 (*)    All other components within normal limits  TROPONIN I (HIGH SENSITIVITY)  TROPONIN I (HIGH SENSITIVITY)      EKG   EKG  Interpretation  Date/Time:  Friday November 25 2021 07:41:31 EDT Ventricular Rate:  85 PR Interval:  150 QRS Duration: 92 QT Interval:  439 QTC Calculation: 523 R Axis:   68 Text Interpretation: Sinus rhythm T wave inversions unchanged from previous ECG 12/03/20 Prolonged QT interval Confirmed by [place] ([id number]) on 11/25/2021 7:45:29 AM         Imaging Studies ordered: I ordered imaging studies including chest x-ray I independently visualized and interpreted imaging. I agree with the radiologist interpretation   Medicines ordered and prescription drug management: Meds ordered this encounter  Medications   potassium chloride SA (KLOR-CON M) CR tablet 40 mEq   magnesium oxide (MAG-OX) tablet 800 mg   lactated ringers bolus 1,000 mL    -I have reviewed the patients home medicines and have made adjustments as needed  Critical interventions none   Cardiac Monitoring: The patient was maintained on a cardiac monitor.  I personally viewed and interpreted the cardiac monitored which showed an underlying rhythm of: NSR, intermittent PVCs  Social Determinants of Health:  Factors impacting patients care include: none   Reevaluation: After the interventions noted above, I reevaluated the patient and found that they have :improved  Co morbidities that complicate the patient evaluation  Past Medical History:  Diagnosis Date  Adenomatous colon polyp    BPH (benign prostatic hyperplasia)    CAD (coronary artery disease)    cardiolite ok 3/05, stress test- low risk study 11/06   Chronic low back pain    with radiculopathy   ED (erectile dysfunction)    Gallstones    abd [place]- stable hamangioma, gallbladder sludge and tiny stones 09/2003   Gastritis    GERD (gastroesophageal reflux disease)    H pylori ulcer    Hyperlipidemia    Hypertension    Transfusion history       Dispostion: I considered admission for this patient, but the patient currently does not meet  inpatient criteria for admission and is safe for discharge with outpatient follow-up     Final Clinical Impression(s) / ED Diagnoses Final diagnoses:  Near syncope     '@PCDICTATION'$ @    Teressa Lower, MD 11/25/21 1058

## 2021-11-28 ENCOUNTER — Encounter: Payer: Self-pay | Admitting: Family Medicine

## 2021-11-28 ENCOUNTER — Telehealth: Payer: Self-pay

## 2021-11-28 ENCOUNTER — Ambulatory Visit (INDEPENDENT_AMBULATORY_CARE_PROVIDER_SITE_OTHER): Payer: Medicare Other | Admitting: Family Medicine

## 2021-11-28 VITALS — BP 140/62 | HR 75 | Ht 67.0 in | Wt 180.0 lb

## 2021-11-28 DIAGNOSIS — D509 Iron deficiency anemia, unspecified: Secondary | ICD-10-CM | POA: Diagnosis not present

## 2021-11-28 DIAGNOSIS — R55 Syncope and collapse: Secondary | ICD-10-CM | POA: Diagnosis not present

## 2021-11-28 DIAGNOSIS — K254 Chronic or unspecified gastric ulcer with hemorrhage: Secondary | ICD-10-CM

## 2021-11-28 DIAGNOSIS — I10 Essential (primary) hypertension: Secondary | ICD-10-CM | POA: Diagnosis not present

## 2021-11-28 DIAGNOSIS — I422 Other hypertrophic cardiomyopathy: Secondary | ICD-10-CM | POA: Insufficient documentation

## 2021-11-28 DIAGNOSIS — K279 Peptic ulcer, site unspecified, unspecified as acute or chronic, without hemorrhage or perforation: Secondary | ICD-10-CM | POA: Diagnosis not present

## 2021-11-28 NOTE — Patient Instructions (Addendum)
Continue the protonix and carafate  Also iron  For constipation use miralax as needed   Lab today  Give yourself time to get your energy back  Move slower

## 2021-11-28 NOTE — Assessment & Plan Note (Signed)
Recent GI bleed Reviewed hospital records, lab results and studies in detail  Improved now

## 2021-11-28 NOTE — Assessment & Plan Note (Signed)
Recent GI bleeding   Taking ferrous sulfate Ferritin was nl   Cbc today

## 2021-11-28 NOTE — Telephone Encounter (Signed)
Per Estill Bamberg, [place]: "Patient being discharged from hospital, can use 4-6 week OV in office with me or Dr. Fuller Plan."  Pt scheduled for follow up with Estill Bamberg, [place] on

## 2021-11-28 NOTE — Telephone Encounter (Signed)
Error on previous note. Pt scheduled for 01/04/22 at 11:30 am with Estill Bamberg, [place]. Pt notified via mychart.

## 2021-11-28 NOTE — Assessment & Plan Note (Signed)
S/p hosp times 2 with GI bleed and also abn echocardiogram Reviewed hospital records, lab results and studies in detail   Taking ppi bid and carafate , no abd pain Cbc today  GI f/u next mo

## 2021-11-28 NOTE — Progress Notes (Signed)
Subjective:    Patient ID: Jerry Farrell, male    DOB: [birth year], 76 y.o.   MRN: [id number]  HPI Pt presents for f/u of hospitalization for presyncope  Wt Readings from Last 3 Encounters:  11/28/21 180 lb (81.6 kg)  11/25/21 181 lb 3.5 oz (82.2 kg)  11/24/21 181 lb 3.5 oz (82.2 kg)   28.19 kg/m   Was originally admited 7/30  Prev to that EGD with biopsies and his Hb dropped from 13.3 to 9.5 Repeat EGD during hosp note clotted blood in gastric body/fundus and multiple non bleeding ulcers  He has remote h/o bleeding ulcers   Was inst to take bid ppi for 8 wk then back to daily  Also carafate for 2 weeks and f/u GI  IV ferrecrit given prior to d/c and inst to continue oral iron  Carotid US was neg for ICA stenosis  Echol noted severe hypertrophy of LV septum (planned f/u with Dr Olevia Perches on 8/14)  He presented back to ER on 8/4 with sob/palpitations and presyncope again (when getting up at night to go to the bathroom) K was slt low at 3.1- was repleted Hb 8.7, with HR of 104  They noted some pvc on ekg but no other acute changes  Was d/c with outpt f/u     Lab Results  Component Value Date   WBC 8.6 11/25/2021   HGB 8.7 (L) 11/25/2021   HCT 26.0 (L) 11/25/2021   MCV 90.6 11/25/2021   PLT 164 11/25/2021   Lab Results  Component Value Date   IRON 43 (L) 11/24/2021   TIBC 267 11/24/2021   FERRITIN 25 11/24/2021   Takes ferrous sulfate 325 mg daily   Protonix 40 mg bid   Today was feeling better  Tired after walking outside  Not pre syncopal like he was  Little light headed when he gets up   No GI upset or stomach pain  Appetite is great  Careful about his diet   No more palpitations  No ankle swelling     Lab Results  Component Value Date   CREATININE 1.17 11/25/2021   BUN 17 11/25/2021   NA 140 11/25/2021   K 3.1 (L) 11/25/2021   CL 107 11/25/2021   CO2 25 11/25/2021   Lab Results  Component Value Date   ALT 34 11/25/2021   AST 28  11/25/2021   ALKPHOS 72 11/25/2021   BILITOT 1.1 11/25/2021   Lab Results  Component Value Date   VITAMINB12 349 09/30/2020    HTN  BP Readings from Last 3 Encounters:  11/28/21 (!) 140/62  11/25/21 (!) 141/54  11/24/21 135/67    Pulse Readings from Last 3 Encounters:  11/28/21 75  11/25/21 79  11/24/21 83     Has GI f/u planned for 9/13   Patient Active Problem List   Diagnosis Date Noted   Dehydration 11/21/2021   Acute prerenal azotemia 11/21/2021   GI bleed 11/21/2021   Postural dizziness with presyncope 11/20/2021   Right-sided thoracic back pain 09/20/2021   Pre-syncope 09/20/2021   CAD in native artery 12/02/2020   Chest pain of uncertain etiology    Thrombocytopenia ([place]) 10/07/2020   Pedal edema 03/28/2020   Gallstones 06/26/2019   Coronary artery disease 05/20/2019   Constipation 08/30/2018   Fatigue 08/30/2018   Hyponatremia 08/30/2018   Dizziness 08/26/2018   Peptic ulcer disease 06/14/2016   Iron deficiency anemia 06/14/2016   B12 deficiency 06/14/2016   Prediabetes 01/31/2016  Need for hepatitis C screening test 01/25/2016   Post-nasal drip 12/10/2015   GERD (gastroesophageal reflux disease) 07/20/2015   Routine general medical examination at a health care facility 10/28/2014   Elevated alkaline phosphatase level 10/28/2014   BPH (benign prostatic hyperplasia) 04/29/2014   Prostate cancer screening 08/11/2013   Encounter for Medicare annual wellness exam 08/11/2013   Dyslipidemia, goal LDL below 70 01/09/2007   ERECTILE DYSFUNCTION 01/09/2007   Essential hypertension 01/09/2007   Hx of CABG 01/09/2007   ESOPHAGITIS 01/09/2007   Past Medical History:  Diagnosis Date   Adenomatous colon polyp    BPH (benign prostatic hyperplasia)    CAD (coronary artery disease)    cardiolite ok 3/05, stress test- low risk study 11/06   Chronic low back pain    with radiculopathy   ED (erectile dysfunction)    Gallstones    abd [place]- stable  hamangioma, gallbladder sludge and tiny stones 09/2003   Gastritis    GERD (gastroesophageal reflux disease)    H pylori ulcer    Hyperlipidemia    Hypertension    Transfusion history    Past Surgical History:  Procedure Laterality Date   CATARACT EXTRACTION W/PHACO  03/13/2012   Procedure: CATARACT EXTRACTION PHACO AND INTRAOCULAR LENS PLACEMENT ([place]);  Surgeon: Adonis Brook, MD;  Location: [facility];  Service: Ophthalmology;  Laterality: Left;   CORONARY ARTERY BYPASS GRAFT  1998   x 1  done [facility]   ENTEROSCOPY N/A 11/22/2021   Procedure: ENTEROSCOPY;  Surgeon: Daryel November, MD;  Location: [facility];  Service: Gastroenterology;  Laterality: N/A;   ESOPHAGOGASTRODUODENOSCOPY (EGD) WITH PROPOFOL N/A 06/07/2016   Procedure: ESOPHAGOGASTRODUODENOSCOPY (EGD) WITH PROPOFOL;  Surgeon: Mauri Pole, MD;  Location: [facility];  Service: Endoscopy;  Laterality: N/A;   GASTRECTOMY     partial   HEMOSTASIS CLIP PLACEMENT  11/22/2021   Procedure: HEMOSTASIS CLIP PLACEMENT;  Surgeon: Daryel November, MD;  Location: [facility];  Service: Gastroenterology;;   LEFT HEART CATH AND CORS/GRAFTS ANGIOGRAPHY N/A 12/02/2020   Procedure: LEFT HEART CATH AND CORS/GRAFTS ANGIOGRAPHY;  Surgeon: Lorretta Harp, MD;  Location: [facility];  Service: Cardiovascular;  Laterality: N/A;   MEMBRANE PEEL  03/13/2012   Procedure: MEMBRANE PEEL;  Surgeon: Adonis Brook, MD;  Location: [facility];  Service: Ophthalmology;  Laterality: Left;   PARS PLANA VITRECTOMY  03/13/2012   Procedure: PARS PLANA VITRECTOMY WITH 23 GAUGE;  Surgeon: Adonis Brook, MD;  Location: [facility];  Service: Ophthalmology;  Laterality: Left;   Social History   Tobacco Use   Smoking status: Never   Smokeless tobacco: Never  Vaping Use   Vaping Use: Never used  Substance Use Topics   Alcohol use: No    Alcohol/week: 0.0 standard drinks of alcohol   Drug use: No   Family History  Problem Relation Age of Onset    Hypertension Mother    Throat cancer Brother        pt feels alcohol related   Colon cancer Brother        1/2 brother   Stomach cancer Neg Hx    Pancreatic cancer Neg Hx    No Known Allergies Current Outpatient Medications on File Prior to Visit  Medication Sig Dispense Refill   acetaminophen (TYLENOL) 500 MG tablet Take 1,000 mg by mouth every 6 (six) hours as needed for mild pain or headache.     aluminum hydroxide-magnesium carbonate (GAVISCON) 95-358 MG/15ML SUSP Take 15 mLs by  mouth as needed for indigestion.     amLODipine (NORVASC) 5 MG tablet Take 0.5 tablets (2.5 mg total) by mouth 2 (two) times daily. 90 tablet 3   atorvastatin (LIPITOR) 40 MG tablet Take 1 tablet (40 mg total) by mouth daily. (Patient taking differently: Take 40 mg by mouth at bedtime.) 90 tablet 3   carvedilol (COREG) 3.125 MG tablet TAKE 1 TABLET BY MOUTH 2 TIMES DAILY WITH A MEAL. (Patient taking differently: Take 3.125 mg by mouth 2 (two) times daily with a meal.) 180 tablet 3   doxazosin (CARDURA) 4 MG tablet TAKE 1 TABLET EVERY DAY (Patient taking differently: Take 4 mg by mouth daily.) 90 tablet 2   famotidine (PEPCID) 20 MG tablet TAKE 1 TABLET BY MOUTH TWICE A DAY (Patient taking differently: Take 20 mg by mouth 2 (two) times daily.) 60 tablet 1   ferrous sulfate 325 (65 FE) MG EC tablet Take 1 tablet (325 mg total) by mouth daily with breakfast.     pantoprazole (PROTONIX) 40 MG tablet Take 1 tablet (40 mg total) by mouth 2 (two) times daily. 112 tablet 0   [START ON 01/20/2022] pantoprazole (PROTONIX) 40 MG tablet Take 1 tablet (40 mg total) by mouth daily. 90 tablet 1   Phenylephrine-DM-GG (ROBAFEN CF MULTI-SYMPTOM COLD) 5-10-100 MG/5ML LIQD Take 5 mLs by mouth daily as needed (cough).     sucralfate (CARAFATE) 1 GM/10ML suspension Take 10 mLs (1 g total) by mouth 4 (four) times daily -  with meals and at bedtime for 11 days. 440 mL 0   SYSTANE BALANCE 0.6 % SOLN Place 1 drop into both eyes 3 (three)  times daily as needed (for dryness).     tamsulosin (FLOMAX) 0.4 MG CAPS capsule TAKE 1 CAPSULES BY MOUTH ONCE DAILY. (Patient taking differently: Take 0.4 mg by mouth at bedtime.) 90 capsule 1   valsartan (DIOVAN) 320 MG tablet TAKE 1 TABLET BY MOUTH EVERY DAY (Patient taking differently: Take 320 mg by mouth daily.) 90 tablet 3   [START ON 12/09/2021] aspirin EC 81 MG tablet Take 1 tablet (81 mg total) by mouth daily. (Patient not taking: Reported on 11/28/2021) 90 tablet 3   hydrALAZINE (APRESOLINE) 25 MG tablet Take 1 tablet (25 mg total) by mouth 2 (two) times daily. 180 tablet 3   No current facility-administered medications on file prior to visit.    Review of Systems  Constitutional:  Positive for fatigue. Negative for activity change, appetite change, fever and unexpected weight change.  HENT:  Negative for congestion, rhinorrhea, sore throat and trouble swallowing.   Eyes:  Negative for pain, redness, itching and visual disturbance.  Respiratory:  Negative for cough, chest tightness, shortness of breath and wheezing.   Cardiovascular:  Negative for chest pain and palpitations.  Gastrointestinal:  Negative for abdominal pain, blood in stool, constipation, diarrhea and nausea.  Endocrine: Negative for cold intolerance, heat intolerance, polydipsia and polyuria.  Genitourinary:  Negative for difficulty urinating, dysuria, frequency and urgency.  Musculoskeletal:  Negative for arthralgias, joint swelling and myalgias.  Skin:  Negative for pallor and rash.  Neurological:  Positive for light-headedness. Negative for dizziness, tremors, weakness, numbness and headaches.       Occ feels light headed initially with standing   Hematological:  Negative for adenopathy. Does not bruise/bleed easily.  Psychiatric/Behavioral:  Negative for decreased concentration and dysphoric mood. The patient is not nervous/anxious.        Objective:   Physical Exam Constitutional:  General: He is not in  acute distress.    Appearance: Normal appearance. He is well-developed and normal weight. He is not ill-appearing or diaphoretic.  HENT:     Head: Normocephalic and atraumatic.     Mouth/Throat:     Mouth: Mucous membranes are moist.  Eyes:     General: No scleral icterus.    Conjunctiva/sclera: Conjunctivae normal.     Pupils: Pupils are equal, round, and reactive to light.  Neck:     Thyroid: No thyromegaly.     Vascular: No carotid bruit or JVD.  Cardiovascular:     Rate and Rhythm: Normal rate and regular rhythm.     Heart sounds: Normal heart sounds.     No gallop.  Pulmonary:     Effort: Pulmonary effort is normal. No respiratory distress.     Breath sounds: Normal breath sounds. No wheezing or rales.  Abdominal:     General: There is no distension or abdominal bruit.     Palpations: Abdomen is soft.     Comments: Baseline scars No tenderness   Musculoskeletal:     Cervical back: Normal range of motion and neck supple.     Right lower leg: No edema.     Left lower leg: No edema.  Lymphadenopathy:     Cervical: No cervical adenopathy.  Skin:    General: Skin is warm and dry.     Coloration: Skin is not jaundiced or pale.     Findings: No bruising or rash.  Neurological:     Mental Status: He is alert.     Cranial Nerves: No cranial nerve deficit.     Motor: No weakness.     Coordination: Coordination normal.     Deep Tendon Reflexes: Reflexes are normal and symmetric. Reflexes normal.  Psychiatric:        Mood and Affect: Mood normal.           Assessment & Plan:   Problem List Items Addressed This Visit       Cardiovascular and Mediastinum   Asymmetric septal hypertrophy (HCC)    LV septal hypertrophy noted on echo in hospital   Not currently sob bp stable For f/u with cardiology next week      Essential hypertension    bp is stable  BP: (!) 140/62    For cardiology f/u later this mo with severe hypertrophy of LV septum noted during last  hospitalization       Relevant Orders   Basic metabolic panel   Pre-syncope - Primary    S/p hosp times 2 with GI bleed and also abn echocardiogram Reviewed hospital records, lab results and studies in detail   Taking ppi bid and carafate , no abd pain Cbc today  GI f/u next mo      Relevant Orders   Basic metabolic panel     Digestive   GI bleed    Reviewed hospital records, lab results and studies in detail  Thought to be from most recent EGD biopsies and/or peptic ulcers  Taking bid protoinx and carafate Clinically improved  fe sulfate daily  For GI f/u next month Cbc today      Relevant Orders   CBC with Differential/Platelet   Basic metabolic panel   Peptic ulcer disease    Recent GI bleed Reviewed hospital records, lab results and studies in detail  Improved now         Other   Iron deficiency anemia  Recent GI bleeding   Taking ferrous sulfate Ferritin was nl   Cbc today      Relevant Orders   CBC with Differential/Platelet

## 2021-11-28 NOTE — Assessment & Plan Note (Signed)
bp is stable  BP: (!) 140/62    For cardiology f/u later this mo with severe hypertrophy of LV septum noted during last hospitalization

## 2021-11-28 NOTE — Assessment & Plan Note (Signed)
LV septal hypertrophy noted on echo in hospital   Not currently sob bp stable For f/u with cardiology next week

## 2021-11-28 NOTE — Assessment & Plan Note (Signed)
Reviewed hospital records, lab results and studies in detail  Thought to be from most recent EGD biopsies and/or peptic ulcers  Taking bid protoinx and carafate Clinically improved  fe sulfate daily  For GI f/u next month Cbc today

## 2021-11-29 ENCOUNTER — Encounter: Payer: Self-pay | Admitting: Gastroenterology

## 2021-11-29 LAB — BASIC METABOLIC PANEL
BUN: 12 mg/dL (ref 7–25)
CO2: 25 mmol/L (ref 20–32)
Calcium: 8.5 mg/dL — ABNORMAL LOW (ref 8.6–10.3)
Chloride: 105 mmol/L (ref 98–110)
Creat: 1.11 mg/dL (ref 0.70–1.28)
Glucose, Bld: 113 mg/dL — ABNORMAL HIGH (ref 65–99)
Potassium: 3.9 mmol/L (ref 3.5–5.3)
Sodium: 140 mmol/L (ref 135–146)

## 2021-11-29 LAB — CBC WITH DIFFERENTIAL/PLATELET
Absolute Monocytes: 449 cells/uL (ref 200–950)
Basophils Absolute: 20 cells/uL (ref 0–200)
Basophils Relative: 0.3 %
Eosinophils Absolute: 27 cells/uL (ref 15–500)
Eosinophils Relative: 0.4 %
HCT: 26.9 % — ABNORMAL LOW (ref 38.5–50.0)
Hemoglobin: 9.1 g/dL — ABNORMAL LOW (ref 13.2–17.1)
Lymphs Abs: 1047 cells/uL (ref 850–3900)
MCH: 31.2 pg (ref 27.0–33.0)
MCHC: 33.8 g/dL (ref 32.0–36.0)
MCV: 92.1 fL (ref 80.0–100.0)
MPV: 12.1 fL (ref 7.5–12.5)
Monocytes Relative: 6.6 %
Neutro Abs: 5256 cells/uL (ref 1500–7800)
Neutrophils Relative %: 77.3 %
Platelets: 205 10*3/uL (ref 140–400)
RBC: 2.92 10*6/uL — ABNORMAL LOW (ref 4.20–5.80)
RDW: 15.3 % — ABNORMAL HIGH (ref 11.0–15.0)
Total Lymphocyte: 15.4 %
WBC: 6.8 10*3/uL (ref 3.8–10.8)

## 2021-12-03 ENCOUNTER — Other Ambulatory Visit: Payer: Self-pay | Admitting: Cardiovascular Disease

## 2021-12-05 ENCOUNTER — Other Ambulatory Visit: Payer: Self-pay | Admitting: Family Medicine

## 2021-12-05 ENCOUNTER — Ambulatory Visit: Payer: Medicare Other | Admitting: Cardiovascular Disease

## 2021-12-05 ENCOUNTER — Encounter: Payer: Self-pay | Admitting: Cardiovascular Disease

## 2021-12-05 VITALS — BP 140/60 | HR 68 | Ht 67.0 in | Wt 179.0 lb

## 2021-12-05 DIAGNOSIS — I10 Essential (primary) hypertension: Secondary | ICD-10-CM | POA: Diagnosis not present

## 2021-12-05 DIAGNOSIS — E785 Hyperlipidemia, unspecified: Secondary | ICD-10-CM | POA: Diagnosis not present

## 2021-12-05 DIAGNOSIS — Z951 Presence of aortocoronary bypass graft: Secondary | ICD-10-CM | POA: Diagnosis not present

## 2021-12-05 DIAGNOSIS — I422 Other hypertrophic cardiomyopathy: Secondary | ICD-10-CM

## 2021-12-05 MED ORDER — CARVEDILOL 6.25 MG PO TABS: 6.2500 mg | ORAL_TABLET | Freq: Two times a day (BID) | ORAL | 1 refills | Status: DC

## 2021-12-05 NOTE — Patient Instructions (Addendum)
Medication Instructions:  INCREASE the Carvedilol to 6. 25 mg twice daily  *If you need a refill on your cardiac medications before your next appointment, please call your pharmacy*   Lab Work: None ordered If you have labs (blood work) drawn today and your tests are completely normal, you will receive your results only by: [place] (if you have MyChart) OR A paper copy in the mail If you have any lab test that is abnormal or we need to change your treatment, we will call you to review the results.   Testing/Procedures: None ordered   Follow-Up: At [facility], you and your health needs are our priority.  As part of our continuing mission to provide you with exceptional heart care, we have created designated Provider Care Teams.  These Care Teams include your primary Cardiologist (physician) and Advanced Practice Providers (APPs -  Physician Assistants and Nurse Practitioners) who all work together to provide you with the care you need, when you need it.  We recommend signing up for the patient portal called "MyChart".  Sign up information is provided on this After Visit Summary.  MyChart is used to connect with patients for Virtual Visits (Telemedicine).  Patients are able to view lab/test results, encounter notes, upcoming appointments, etc.  Non-urgent messages can be sent to your provider as well.   To learn more about what you can do with MyChart, go to [email or web address].    Your next appointment:   6 month(s)  The format for your next appointment:   In Person  Provider:   Quay Burow, MD     Other Instructions A referral has been placed to Dr. Gasper Sells. They will call you to set this up.  Important Information About Sugar

## 2021-12-05 NOTE — Assessment & Plan Note (Signed)
Recent 2D echocardiogram performed 11/21/2021 showed severe asymmetric septal hypertrophy measuring 1.9 cm.  I am referring him to Dr. Owens Shark for further evaluation.

## 2021-12-05 NOTE — Assessment & Plan Note (Signed)
History of dyslipidemia on statin therapy lipid profile performed 09/26/2021 revealing total cholesterol of 110, LDL 37 and HDL 58.

## 2021-12-05 NOTE — Assessment & Plan Note (Signed)
History of essential hypertension a blood pressure measured today at 140/60.  He is on low-dose carvedilol, low-dose amlodipine, hydralazine and valsartan.  I am going to increase his carvedilol from 3.125 to 6.25 mg p.o. twice daily.

## 2021-12-05 NOTE — Progress Notes (Signed)
12/05/2021 Jerry Farrell   12/07/1945  [id number]  Primary Physician Tower, Wynelle Fanny, MD Primary Cardiologist: Lorretta Harp MD [name], [place]  HPI:  Jerry Farrell is a 76 y.o.   thin-appearing married African-American male father of 1, grandfather and 5 grandchildren who is referred back to me by Dr. Glori Bickers, his PCP, to be reestablished in my practice. I last saw him in the office 12/21/2020.Marland Kitchen  He is accompanied by his wife Jerry Farrell  today.  His risk factors include treated hypertension and hyperlipidemia. There is is a family history of a brother who recently had bypass surgery. He has never smoked. Retired from [employer]. He had coronary artery bypass grafting X 2 in 1994 .he had a LIMA to his LAD and a vein to the RCA.  He denies chest pain or shortness of breath. He did see how main in the office 06/02/16 after having had a Myoview stress test 04/11/16 that was essentially normal with no evidence of ischemia.    He had developed exertional chest tightness and dyspnea as well as fatigue.  He did not have the symptoms when I saw him back in February of 2022.  Based on this, I decided to proceed with elective outpatient diagnostic coronary angiography on 12/02/2020 via the femoral approach.  I demonstrated normal LV systolic function, and occluded proximal LAD with a patent LIMA to the LAD, left to left collaterals from the circumflex to an occluded diagonal branch, and an occluded native RCA and RCA vein graft.  Since his procedure his symptoms of chest pain and dyspnea have markedly improved.  Since I saw him a year ago he was admitted with a GI bleed and hemoglobin in the 8 range although this is since come up to 9.1.  A 2D echo recently performed 11/21/2021 showed normal LV systolic function with grade 1 diastolic dysfunction and severe asymmetric septal hypertrophy with septum measuring 1.9 cm.  He does complain of effort angina.  It certainly possible this is multifactorial from his low  hemoglobin, asymmetric septal hypertrophy plus or minus progression of his coronary artery disease.   Current Meds  Medication Sig   acetaminophen (TYLENOL) 500 MG tablet Take 1,000 mg by mouth every 6 (six) hours as needed for mild pain or headache.   aluminum hydroxide-magnesium carbonate (GAVISCON) 95-358 MG/15ML SUSP Take 15 mLs by mouth as needed for indigestion.   amLODipine (NORVASC) 5 MG tablet Take 0.5 tablets (2.5 mg total) by mouth 2 (two) times daily.   atorvastatin (LIPITOR) 40 MG tablet Take 1 tablet (40 mg total) by mouth daily. (Patient taking differently: Take 40 mg by mouth at bedtime.)   doxazosin (CARDURA) 4 MG tablet TAKE 1 TABLET EVERY DAY (Patient taking differently: Take 4 mg by mouth daily.)   ferrous sulfate 325 (65 FE) MG EC tablet Take 1 tablet (325 mg total) by mouth daily with breakfast.   pantoprazole (PROTONIX) 40 MG tablet Take 1 tablet (40 mg total) by mouth 2 (two) times daily.   [START ON 01/20/2022] pantoprazole (PROTONIX) 40 MG tablet Take 1 tablet (40 mg total) by mouth daily.   SYSTANE BALANCE 0.6 % SOLN Place 1 drop into both eyes 3 (three) times daily as needed (for dryness).   tamsulosin (FLOMAX) 0.4 MG CAPS capsule TAKE 1 CAPSULES BY MOUTH ONCE DAILY. (Patient taking differently: Take 0.4 mg by mouth at bedtime.)   valsartan (DIOVAN) 320 MG tablet TAKE 1 TABLET BY MOUTH EVERY DAY (Patient  taking differently: Take 320 mg by mouth daily.)   [DISCONTINUED] carvedilol (COREG) 3.125 MG tablet TAKE 1 TABLET BY MOUTH 2 TIMES DAILY WITH A MEAL.   [DISCONTINUED] famotidine (PEPCID) 20 MG tablet TAKE 1 TABLET BY MOUTH TWICE A DAY (Patient taking differently: Take 20 mg by mouth 2 (two) times daily.)     No Known Allergies  Social History   Socioeconomic History   Marital status: Married    Spouse name: Not on file   Number of children: 3   Years of education: Not on file   Highest education level: Not on file  Occupational History   Occupation:  [employer]    Employer: RETIRED  Tobacco Use   Smoking status: Never   Smokeless tobacco: Never  Vaping Use   Vaping Use: Never used  Substance and Sexual Activity   Alcohol use: No    Alcohol/week: 0.0 standard drinks of alcohol   Drug use: No   Sexual activity: Yes  Other Topics Concern   Not on file  Social History Narrative   Not on file   Social Determinants of Health   Financial Resource Strain: Low Risk  (06/24/2021)   Overall Financial Resource Strain (CARDIA)    Difficulty of Paying Living Expenses: Not hard at all  Food Insecurity: No Food Insecurity (06/24/2021)   Hunger Vital Sign    Worried About Running Out of Food in the Last Year: Never true    [place] in the Last Year: Never true  Transportation Needs: No Transportation Needs (06/24/2021)   PRAPARE - Hydrologist (Medical): No    Lack of Transportation (Non-Medical): No  Physical Activity: Unknown (06/24/2021)   Exercise Vital Sign    Days of Exercise per Week: 3 days    Minutes of Exercise per Session: Not on file  Stress: No Stress Concern Present (06/24/2021)   [place]    Feeling of Stress : Not at all  Social Connections: [place] (06/24/2021)   Social Connection and Isolation Panel [NHANES]    Frequency of Communication with Friends and Family: More than three times a week    Frequency of Social Gatherings with Friends and Family: More than three times a week    Attends Religious Services: More than 4 times per year    Active Member of [employer] or Organizations: Yes    Attends Music therapist: More than 4 times per year    Marital Status: Married  Human resources officer Violence: Not At Risk (06/24/2021)   Humiliation, Afraid, Rape, and Kick questionnaire    Fear of Current or Ex-Partner: No    Emotionally Abused: No    Physically Abused: No    Sexually Abused: No     Review of  Systems: General: negative for chills, fever, night sweats or weight changes.  Cardiovascular: negative for chest pain, dyspnea on exertion, edema, orthopnea, palpitations, paroxysmal nocturnal dyspnea or shortness of breath Dermatological: negative for rash Respiratory: negative for cough or wheezing Urologic: negative for hematuria Abdominal: negative for nausea, vomiting, diarrhea, bright red blood per rectum, melena, or hematemesis Neurologic: negative for visual changes, syncope, or dizziness All other systems reviewed and are otherwise negative except as noted above.    Blood pressure (!) 140/60, pulse 68, height '5\' 7"'$  (1.702 m), weight 179 lb (81.2 kg), SpO2 98 %.  General appearance: alert and no distress Neck: no adenopathy, no carotid bruit, no  JVD, supple, symmetrical, trachea midline, and thyroid not enlarged, symmetric, no tenderness/mass/nodules Lungs: clear to auscultation bilaterally Heart: regular rate and rhythm, S1, S2 normal, no murmur, click, rub or gallop Extremities: extremities normal, atraumatic, no cyanosis or edema Pulses: 2+ and symmetric Skin: Skin color, texture, turgor normal. No rashes or lesions Neurologic: Grossly normal  EKG not performed today  ASSESSMENT AND PLAN:   Dyslipidemia, goal LDL below 70 History of dyslipidemia on statin therapy lipid profile performed 09/26/2021 revealing total cholesterol of 110, LDL 37 and HDL 58.  Essential hypertension History of essential hypertension a blood pressure measured today at 140/60.  He is on low-dose carvedilol, low-dose amlodipine, hydralazine and valsartan.  I am going to increase his carvedilol from 3.125 to 6.25 mg p.o. twice daily.  Hx of CABG History of coronary artery bypass grafting in 1994 with a LIMA to the LAD and a vein to the distal right coronary artery.  I did perform cardiac catheterization on him 12/02/2020 up from the femoral approach revealing an occluded LAD, patent circumflex, patent  large ramus with moderate segmental proximal disease and occluded native RCA and RCA vein graft.  The LIMA to LAD was patent.  There were left to right collaterals.  I did do FFR of the proximal ramus branch which showed it DFR of 0.92 suggesting this was not physiologically significant.  He does complain of effort angina.  He recently had a GI bleed and his hemoglobin is in the 9 range.  Asymmetric septal hypertrophy (HCC) Recent 2D echocardiogram performed 11/21/2021 showed severe asymmetric septal hypertrophy measuring 1.9 cm.  I am referring him to Dr. Owens Shark for further evaluation.     Lorretta Harp MD FACP,FACC,FAHA, [facility] 12/05/2021 3:19 PM

## 2021-12-05 NOTE — Assessment & Plan Note (Signed)
History of coronary artery bypass grafting in 1994 with a LIMA to the LAD and a vein to the distal right coronary artery.  I did perform cardiac catheterization on him 12/02/2020 up from the femoral approach revealing an occluded LAD, patent circumflex, patent large ramus with moderate segmental proximal disease and occluded native RCA and RCA vein graft.  The LIMA to LAD was patent.  There were left to right collaterals.  I did do FFR of the proximal ramus branch which showed it DFR of 0.92 suggesting this was not physiologically significant.  He does complain of effort angina.  He recently had a GI bleed and his hemoglobin is in the 9 range.

## 2021-12-20 NOTE — Progress Notes (Unsigned)
Cardiology Office Note:    Date:  12/21/2021   ID:  Jerry, Farrell 13-May-1945, MRN [id number]  PCP:  Abner Greenspan, MD   [facility] Providers Cardiologist:  Quay Burow, MD     Referring MD: Lorretta Harp, MD   CC: HCM Consult  History of Present Illness:    Jerry Farrell is a 76 y.o. male with a hx of CAD s/p CABG 1994, who presents for evaluation for septal hypertrophy with no LVOT Obstruction.  Patient went back to see Dr. Gwenlyn Found- have palpitations, and dizziness when bending. Has recent ulcer 11/25/21 and felt better after transfusion.  Patient notes  SOB at rest and  DOE seen he had transfusion. Notes no fatigue: walked 45 minutes. Notes  palpitations, he had his coreg changed to 6.25 mg and has improved symptoms. Notes no CP. Notes that his has improved by not resolved when standing too quickly. Notes no syncope. Notable family events include brother: with prior open heart surgery for CABG. Can't remember anyone else in the family.  No SCD, drownings or car accidents.  No MSK conditions.  Had two brothers and two sister; decreased family died in 47s because they lives a rough life (one had cancer).  Ambulatory BP 130/50.   Past Medical History:  Diagnosis Date   Adenomatous colon polyp    BPH (benign prostatic hyperplasia)    CAD (coronary artery disease)    cardiolite ok 3/05, stress test- low risk study 11/06   Chronic low back pain    with radiculopathy   ED (erectile dysfunction)    Gallstones    abd [place]- stable hamangioma, gallbladder sludge and tiny stones 09/2003   Gastritis    GERD (gastroesophageal reflux disease)    H pylori ulcer    Hyperlipidemia    Hypertension    Transfusion history     Past Surgical History:  Procedure Laterality Date   CATARACT EXTRACTION W/PHACO  03/13/2012   Procedure: CATARACT EXTRACTION PHACO AND INTRAOCULAR LENS PLACEMENT ([place]);  Surgeon: Adonis Brook, MD;  Location: [place];  Service: Ophthalmology;   Laterality: Left;   CORONARY ARTERY BYPASS GRAFT  1998   x 1  [facility]   ENTEROSCOPY N/A 11/22/2021   Procedure: ENTEROSCOPY;  Surgeon: Daryel November, MD;  Location: [facility];  Service: Gastroenterology;  Laterality: N/A;   ESOPHAGOGASTRODUODENOSCOPY (EGD) WITH PROPOFOL N/A 06/07/2016   Procedure: ESOPHAGOGASTRODUODENOSCOPY (EGD) WITH PROPOFOL;  Surgeon: Mauri Pole, MD;  Location: [facility];  Service: Endoscopy;  Laterality: N/A;   GASTRECTOMY     partial   HEMOSTASIS CLIP PLACEMENT  11/22/2021   Procedure: HEMOSTASIS CLIP PLACEMENT;  Surgeon: Daryel November, MD;  Location: [facility];  Service: Gastroenterology;;   LEFT HEART CATH AND CORS/GRAFTS ANGIOGRAPHY N/A 12/02/2020   Procedure: LEFT HEART CATH AND CORS/GRAFTS ANGIOGRAPHY;  Surgeon: Lorretta Harp, MD;  Location: [facility];  Service: Cardiovascular;  Laterality: N/A;   MEMBRANE PEEL  03/13/2012   Procedure: MEMBRANE PEEL;  Surgeon: Adonis Brook, MD;  Location: [place];  Service: Ophthalmology;  Laterality: Left;   PARS PLANA VITRECTOMY  03/13/2012   Procedure: PARS PLANA VITRECTOMY WITH 23 GAUGE;  Surgeon: Adonis Brook, MD;  Location: [place];  Service: Ophthalmology;  Laterality: Left;    Current Medications: Current Meds  Medication Sig   acetaminophen (TYLENOL) 500 MG tablet Take 1,000 mg by mouth every 6 (six) hours as needed for mild pain or headache.  aluminum hydroxide-magnesium carbonate (GAVISCON) 95-358 MG/15ML SUSP Take 15 mLs by mouth as needed for indigestion.   amLODipine (NORVASC) 5 MG tablet Take 0.5 tablets (2.5 mg total) by mouth 2 (two) times daily.   aspirin EC 81 MG tablet Take 1 tablet (81 mg total) by mouth daily.   atorvastatin (LIPITOR) 40 MG tablet Take 1 tablet (40 mg total) by mouth daily.   carvedilol (COREG) 6.25 MG tablet Take 1 tablet (6.25 mg total) by mouth 2 (two) times daily with a meal.   doxazosin (CARDURA) 4 MG tablet TAKE 1 TABLET EVERY DAY    famotidine (PEPCID) 20 MG tablet TAKE 1 TABLET BY MOUTH TWICE A DAY   ferrous sulfate 325 (65 FE) MG EC tablet Take 1 tablet (325 mg total) by mouth daily with breakfast.   hydrALAZINE (APRESOLINE) 25 MG tablet Take 1 tablet (25 mg total) by mouth 2 (two) times daily.   pantoprazole (PROTONIX) 40 MG tablet Take 1 tablet (40 mg total) by mouth 2 (two) times daily.   [START ON 01/20/2022] pantoprazole (PROTONIX) 40 MG tablet Take 1 tablet (40 mg total) by mouth daily.   SYSTANE BALANCE 0.6 % SOLN Place 1 drop into both eyes 3 (three) times daily as needed (for dryness).   tamsulosin (FLOMAX) 0.4 MG CAPS capsule TAKE 1 CAPSULES BY MOUTH ONCE DAILY.   valsartan (DIOVAN) 320 MG tablet TAKE 1 TABLET BY MOUTH EVERY DAY     Allergies:   Patient has no known allergies.   Social History   Socioeconomic History   Marital status: Married    Spouse name: Not on file   Number of children: 3   Years of education: Not on file   Highest education level: Not on file  Occupational History   Occupation: [employer]    Employer: RETIRED  Tobacco Use   Smoking status: Never   Smokeless tobacco: Never  Vaping Use   Vaping Use: Never used  Substance and Sexual Activity   Alcohol use: No    Alcohol/week: 0.0 standard drinks of alcohol   Drug use: No   Sexual activity: Yes  Other Topics Concern   Not on file  Social History Narrative   Not on file   Social Determinants of Health   Financial Resource Strain: Low Risk  (06/24/2021)   Overall Financial Resource Strain (CARDIA)    Difficulty of Paying Living Expenses: Not hard at all  Food Insecurity: No Food Insecurity (06/24/2021)   Hunger Vital Sign    Worried About Running Out of Food in the Last Year: Never true    [place] in the Last Year: Never true  Transportation Needs: No Transportation Needs (06/24/2021)   PRAPARE - Hydrologist (Medical): No    Lack of Transportation (Non-Medical): No  Physical Activity:  Unknown (06/24/2021)   Exercise Vital Sign    Days of Exercise per Week: 3 days    Minutes of Exercise per Session: Not on file  Stress: No Stress Concern Present (06/24/2021)   [place]    Feeling of Stress : Not at all  Social Connections: [place] (06/24/2021)   Social Connection and Isolation Panel [NHANES]    Frequency of Communication with Friends and Family: More than three times a week    Frequency of Social Gatherings with Friends and Family: More than three times a week    Attends Religious Services: More than 4 times  per year    Active Member of Clubs or Organizations: Yes    Attends Archivist Meetings: More than 4 times per year    Marital Status: Married     Family History: The patient's family history includes Colon cancer in his brother; Hypertension in his mother; Throat cancer in his brother. There is no history of Stomach cancer or Pancreatic cancer.  ROS:   Please see the history of present illness.     All other systems reviewed and are negative.  EKGs/Labs/Other Studies Reviewed:    The following studies were reviewed today:  LEFT HEART CATH AND CORS/GRAFTS ANGIOGRAPHY 12/02/2020  Narrative Images from the original result were not included.    Ost RCA to Prox RCA lesion is 100% stenosed.   Ost LAD to Mid LAD lesion is 100% stenosed.   2nd Diag lesion is 100% stenosed.   Origin to Prox Graft lesion is 100% stenosed.   Ramus lesion is 50% stenosed.   The left ventricular systolic function is normal.   The left ventricular ejection fraction is 55-65% by visual estimate.  ANNIE ROSEBOOM is a 76 y.o. male   [id number] LOCATION:  FACILITY: [facility] PHYSICIAN: Quay Burow, M.D. 1946-02-10   DATE OF PROCEDURE:  12/02/2020  DATE OF DISCHARGE:     CARDIAC CATHETERIZATION    History obtained from chart review.HARLIS CHAMPOUX is a 76 y.o.   thin-appearing married  African-American male father of 66, grandfather and 5 grandchildren who is referred back to me by Dr. Glori Bickers, his PCP, to be reestablished in my practice. I last saw him in the office 06/18/2020.Marland Kitchen His risk factors include treated hypertension and hyperlipidemia. There is is a family history of a brother who recently had bypass surgery. He has never smoked. Retired from [employer]. He had coronary artery bypass grafting X 1  in 1994 for what sounds like left main disease. He denies chest pain or shortness of breath. He did see how main in the office 06/02/16 after having had a Myoview stress test 04/11/16 that was essentially normal with no evidence of ischemia.  Since I saw him in the office 6 months ago he has developed exertional chest tightness and dyspnea as well as fatigue.  He did not have the symptoms when I saw him back in February.   PROCEDURE DESCRIPTION:  The patient was brought to the second floor [facility] cath lab in the postabsorptive state. He was not premedicated . His right groin was prepped and shaved in usual sterile fashion. Xylocaine 1% was used for local anesthesia. A 5 upgraded to 6 French sheath was inserted into the right common femoral artery using standard Seldinger technique.  5 French right left Judkins diagnostic catheters over the 5 French pigtail catheter were used for selective coronary angiography, selective vein graft and IMA graft angiography and left ventriculography.  Isovue dye was used for the entirety of the case (130 cc of contrast total administered to patient).  Retrograde aortic, left ventricular and pullback pressures were recorded.  A RFR was performed on the ramus branch because of a long moderate proximal segmental lesion.  The patient received a total of 9000's of heparin with an ACT of 312.  Using a 6 [place] XB 3-1/2 cm guide catheter along with a Abbott FloWire, the wire was normalized and passed across the proximal segmental ramus branch disease  segment.  The RFR on 2 occasions was 0.92 suggesting this was not physiologically significant.  A right  common femoral angiogram was performed and a MYNX  closure device was successfully deployed achieving hemostasis.  Impression Mr. Luellen has an occluded RCA as well as an RCA vein graft with grade 2-3 left to right collaterals with IMA injection through LAD septal perforators.  He is IMEs widely patent.  He does have moderate segmental proximal ramus branch disease with a negative RFR 0.92 suggesting this was not physiologically significant.  He has normal LV function.  Medical therapy will be recommended.  A Mynx closure device was successfully deployed.  The patient left lab in stable condition.  Plans will be for discharge home later today as an outpatient.  I will see him back in the office in 2 to 3 weeks.  Quay Burow. MD, [facility] 12/02/2020 2:42 PM   GATED SPECT MYO PERF W/EXERCISE STRESS 1D 04/11/2016  Narrative  The left ventricular ejection fraction is normal (55-65%).  Nuclear stress EF: 63%.  Blood pressure demonstrated a hypertensive response to exercise.  Horizontal ST segment depression ST segment depression of 4 mm was noted during stress in the II, III, aVF, V6, V5 and V4 leads, and returning to baseline after 1-5 minutes of recovery.  This is a low risk study.  Normal perfusion. Mildly reduced exercise capacity with hypertensive response to exercise. Significant horizontal ST depression of 4 mm inferolaterally in the setting of LVH - may represent repolarization abnormality given normal perfusion. LVEF 63% with normal wall motion. This is a low risk study.      ECHO COMPLETE WO IMAGING ENHANCING AGENT 11/21/2021  Narrative ECHOCARDIOGRAM REPORT    Patient Name:   DANTONIO JUSTEN Date of Exam: 11/21/2021 Medical Rec #:  [id number]      Height:       67.0 in Accession #:    [id number]     Weight:       185.0 lb Date of Birth:  [birth year]      BSA:          1.956  m Patient Age:    66 years       BP:           95/60 mmHg Patient Gender: M              HR:           87 bpm. Exam Location:  Inpatient  Procedure: 2D Echo, Cardiac Doppler and Color Doppler  Indications:     Syncope  History:         Patient has no prior history of Echocardiogram examinations. CAD.  Sonographer:     Jefferey Pica Referring Phys:  [id number] Rhetta Mura Diagnosing Phys: Gwyndolyn Kaufman MD  IMPRESSIONS   1. Left ventricular ejection fraction, by estimation, is 70 to 75%. The left ventricle has normal function. The left ventricle has no regional wall motion abnormalities. There is severe hypertrophy of the LV septum measuring 1.9cm. The rest of the LV segments demonstrate moderate left ventricular hypertrophy. Left ventricular diastolic parameters are consistent with Grade I diastolic dysfunction (impaired relaxation). LV internal cavity appears small and underfilled. There is an intracavitary gradient measuring 74mHg without significant LVOT gradient/obstruction. 2. Right ventricular systolic function is normal. The right ventricular size is normal. 3. The mitral valve is normal in structure. No evidence of mitral valve regurgitation. No evidence of mitral stenosis. 4. The aortic valve is tricuspid. There is mild calcification of the aortic valve. There is mild thickening of the aortic valve. Aortic valve regurgitation is  trivial. Aortic valve sclerosis/calcification is present, without any evidence of aortic stenosis. 5. Consider CMR to work-up for possible HCM given severe septal hypertrophy.  Comparison(s): No prior Echocardiogram.  FINDINGS Left Ventricle: Left ventricular ejection fraction, by estimation, is 70 to 75%. The left ventricle has normal function. The left ventricle has no regional wall motion abnormalities. The left ventricular internal cavity size was small. There is severe hypertrophy of the LV septum measuring 1.9cm. The rest of the LV  segments demonstrate moderate left ventricular hypertrophy. Left ventricular diastolic parameters are consistent with Grade I diastolic dysfunction (impaired relaxation).  Right Ventricle: The right ventricular size is normal. No increase in right ventricular wall thickness. Right ventricular systolic function is normal.  Left Atrium: Left atrial size was normal in size.  Right Atrium: Right atrial size was normal in size.  Pericardium: There is no evidence of pericardial effusion.  Mitral Valve: The mitral valve is normal in structure. No evidence of mitral valve regurgitation. No evidence of mitral valve stenosis.  Tricuspid Valve: The tricuspid valve is normal in structure. Tricuspid valve regurgitation is trivial.  Aortic Valve: The aortic valve is tricuspid. There is mild calcification of the aortic valve. There is mild thickening of the aortic valve. Aortic valve regurgitation is trivial. Aortic valve sclerosis/calcification is present, without any evidence of aortic stenosis. Aortic valve peak gradient measures 6.7 mmHg.  Pulmonic Valve: The pulmonic valve was not well visualized. Pulmonic valve regurgitation is trivial.  Aorta: The aortic root and ascending aorta are structurally normal, with no evidence of dilitation.  Venous: The inferior vena cava was not well visualized.  IAS/Shunts: The atrial septum is grossly normal.   LEFT VENTRICLE PLAX 2D LVIDd:         1.95 cm   Diastology LVIDs:         1.10 cm   LV e' medial:    11.50 cm/s LV PW:         1.70 cm   LV E/e' medial:  3.5 LV IVS:        2.25 cm   LV e' lateral:   11.00 cm/s LVOT diam:     2.00 cm   LV E/e' lateral: 3.7 LV SV:         57 LV SV Index:   29 LVOT Area:     3.14 cm   RIGHT VENTRICLE RV S prime:     11.20 cm/s  LEFT ATRIUM             Index        RIGHT ATRIUM           Index LA diam:        3.20 cm 1.64 cm/m   RA Area:     13.30 cm LA Vol (A2C):   31.7 ml 16.20 ml/m  RA Volume:   30.30 ml   15.49 ml/m LA Vol (A4C):   36.7 ml 18.76 ml/m LA Biplane Vol: 34.6 ml 17.69 ml/m AORTIC VALVE                 PULMONIC VALVE AV Area (Vmax): 2.89 cm     PV Vmax:       1.16 m/s AV Vmax:        129.50 cm/s  PV Peak grad:  5.4 mmHg AV Peak Grad:   6.7 mmHg LVOT Vmax:      119.00 cm/s LVOT Vmean:     66.900 cm/s LVOT VTI:  0.180 m  AORTA Ao Root diam: 3.70 cm Ao Asc diam:  3.20 cm  MITRAL VALVE MV Area (PHT): 4.46 cm    SHUNTS MV Decel Time: 170 msec    Systemic VTI:  0.18 m MV E velocity: 40.30 cm/s  Systemic Diam: 2.00 cm MV A velocity: 68.90 cm/s MV E/A ratio:  0.58  Gwyndolyn Kaufman MD Electronically signed by Gwyndolyn Kaufman MD Signature Date/Time: 11/21/2021/11:44:13 AM    Final (Updated)  LONG TERM MONITOR (8-14 DAYS) INTERPRETATION 10/17/2021  Narrative Patch Wear Time:  14 days and 0 hours (2023-06-06T08:13:29-0400 to 2023-06-20T08:13:33-0400)  Patient had a min HR of 47 bpm, max HR of 167 bpm, and avg HR of 65 bpm. Predominant underlying rhythm was Sinus Rhythm. 11 Supraventricular Tachycardia runs occurred, the run with the fastest interval lasting 1 min 8 secs with a max rate of 167 bpm (avg 159 bpm); the run with the fastest interval was also the longest. Isolated SVEs were rare (<1.0%), SVE Couplets were rare (<1.0%), and SVE Triplets were rare (<1.0%). Isolated VEs were frequent (5.3%, [id number]), VE Couplets were rare (<1.0%, 49), and no VE Triplets were present. Ventricular Bigeminy and Trigeminy were present.  1. SR/SB/ST 2. Freq PVCs 3. Runs of SVT 4. ROV to discuss   VAS US CAROTID DUPLEX BILATERAL 11/21/2021  Narrative Carotid Arterial Duplex Study  Patient Name:  JAYE POLIDORI  Date of Exam:   11/21/2021 Medical Rec #: [id number]       Accession #:    [id number] Date of Birth: 09-17-1945       Patient Gender: M Patient Age:   63 years Exam Location:  [facility] Procedure:      VAS US CAROTID Referring Phys: Babs Bertin   --------------------------------------------------------------------------------  Indications:       Syncope. Risk Factors:      Hypertension. Comparison Study:  No prior studies.  Performing Technologist: Oliver Hum RVT   Examination Guidelines: A complete evaluation includes B-mode imaging, spectral Doppler, color Doppler, and power Doppler as needed of all accessible portions of each vessel. Bilateral testing is considered an integral part of a complete examination. Limited examinations for reoccurring indications may be performed as noted.   Right Carotid Findings: +----------+-------+--------+--------+---------------------+-------------------+           PSV    EDV cm/sStenosisPlaque Description   Comments                      cm/s                                                            +----------+-------+--------+--------+---------------------+-------------------+ CCA Prox  130    19              smooth and           High resistant flow                                  heterogenous                             +----------+-------+--------+--------+---------------------+-------------------+ CCA Distal90     13  smooth and           High resistant flow                                  heterogenous                             +----------+-------+--------+--------+---------------------+-------------------+ ICA Prox  115    20              smooth and           High resistant flow                                  heterogenous                             +----------+-------+--------+--------+---------------------+-------------------+ ICA Distal112    27                                   Highl resistant                                                           flow                +----------+-------+--------+--------+---------------------+-------------------+ ECA       128    12                                                        +----------+-------+--------+--------+---------------------+-------------------+  +----------+--------+-------+--------+-------------------+           PSV cm/sEDV cmsDescribeArm Pressure (mmHG) +----------+--------+-------+--------+-------------------+ [id number]                                        +----------+--------+-------+--------+-------------------+  +---------+--------+--+--------+--+----------------------------+ VertebralPSV cm/s90EDV cm/s15High resistant and Antegrade +---------+--------+--+--------+--+----------------------------+    Left Carotid Findings: +----------+--------+--------+--------+------------------+---------------------+           PSV cm/sEDV cm/sStenosisPlaque DescriptionComments              +----------+--------+--------+--------+------------------+---------------------+ CCA Prox  188     31              smooth and        High resistant flow                                     heterogenous      noted in the proximal                                                     CCA.                  +----------+--------+--------+--------+------------------+---------------------+  CCA Distal154     26              smooth and        High resistant flow                                     heterogenous                            +----------+--------+--------+--------+------------------+---------------------+ ICA Prox  106     28              smooth and        High resistant flow                                     heterogenous                            +----------+--------+--------+--------+------------------+---------------------+ ICA Distal117     33                                High resistant flow   +----------+--------+--------+--------+------------------+---------------------+ ECA       98      6                                                        +----------+--------+--------+--------+------------------+---------------------+  +----------+--------+--------+--------+-------------------+           PSV cm/sEDV cm/sDescribeArm Pressure (mmHG) +----------+--------+--------+--------+-------------------+ Subclavian202                                         +----------+--------+--------+--------+-------------------+  +---------+--------+--+--------+-+----------------------------+ VertebralPSV cm/s53EDV cm/s5Antegrade and High resistant +---------+--------+--+--------+-+----------------------------+      Summary: Right Carotid: Velocities in the right ICA are consistent with a 1-39% stenosis.  Left Carotid: Velocities in the left ICA are consistent with a 1-39% stenosis.  Vertebrals: Bilateral vertebral arteries demonstrate high resistant flow.  *See table(s) above for measurements and observations.    Electronically signed by Orlie Pollen on 11/22/2021 at 12:46:35 PM.    Final     Recent Labs: 11/23/2021: Magnesium 1.7 11/25/2021: ALT 34; B Natriuretic Peptide 100.8 11/28/2021: BUN 12; Creat 1.11; Hemoglobin 9.1; Platelets 205; Potassium 3.9; Sodium 140  Recent Lipid Panel    Component Value Date/Time   CHOL 110 09/26/2021 0956   TRIG 70 09/26/2021 0956   HDL 58 09/26/2021 0956   CHOLHDL 1.9 09/26/2021 0956   CHOLHDL 3 09/30/2020 0738   VLDL 26.0 09/30/2020 0738   LDLCALC 37 09/26/2021 0956   LDLDIRECT 80.0 05/08/2018 0920    Physical Exam:    VS:  BP (!) 122/58   Pulse 67   Ht '5\' 7"'$  (1.702 m)   Wt 82.6 kg   SpO2 98%   BMI 28.51 kg/m     Wt Readings from Last 3 Encounters:  12/21/21 82.6 kg  12/05/21 81.2 kg  11/28/21 81.6 kg    GEN:  Well nourished,  well developed in no acute distress HEENT: Normal NECK: No JVD LYMPHATICS: No lymphadenopathy CARDIAC: RRR, no rubs, gallops; systolic murmur with stand and hand grip RESPIRATORY:  Clear to auscultation  without rales, wheezing or rhonchi  ABDOMEN: Soft, non-tender, non-distended MUSCULOSKELETAL:  minimal bilateral edema; No deformity  SKIN: Warm and dry NEUROLOGIC:  Alert and oriented x 3 PSYCHIATRIC:  Normal affect   ASSESSMENT:    1. Asymmetric septal hypertrophy (HCC)   2. Pre-procedure lab exam   3. Hypertrophic cardiomyopathy ([place])   4. PVC's (premature ventricular contractions)   5. S/P CABG (coronary artery bypass graft)    PLAN:    Hypertrophic Cardiomyopathy Frequent PVCS S/p CABG - his PVC burden may represent prior CABG; given his septal thickness it may represent HCM; we need a CMR both to make the diagnosis and to evaluate his SCD risk - will get CMR, based on this may refer to EP or get POET; discussed risks with patient - NYHA II - his is on hydralazine 25, valsartan, coreg and norvasc- given in mild inducible gradient would attempt to transition these medications to non-vasodilatory agents; he has issues with norvasc realted leg swelling; post CMR if HCM is confirmed we will stop norvasc, start diltiazem and check BP in two weeks - he had three children five children and no family history; at next visit will discuss genetic testing vs screening echos - gave patient information on disease state from the [facility]  Three months with me, not APP  Time Spent Directly with Patient:   I have spent a total of 39 minuteswith the patient reviewing notes, imaging, EKGs, labs and examining the patient as well as establishing an assessment and plan that was discussed personally with the patient.  > 50% of time was spent in direct patient care and family and reviewing imaging with patient.        Medication Adjustments/Labs and Tests Ordered: Current medicines are reviewed at length with the patient today.  Concerns regarding medicines are outlined above.  Orders Placed This Encounter  Procedures   MR CARDIAC MORPHOLOGY W WO CONTRAST   CBC   No orders of the defined types  were placed in this encounter.   Patient Instructions  Medication Instructions:  NO CHANGES *If you need a refill on your cardiac medications before your next appointment, please call your pharmacy*   Lab Work: TODAY CBC If you have labs (blood work) drawn today and your tests are completely normal, you will receive your results only by: [place] (if you have MyChart) OR A paper copy in the mail If you have any lab test that is abnormal or we need to change your treatment, we will call you to review the results.   Testing/Procedure Your physician has requested that you have a cardiac MRI. Cardiac MRI uses a computer to create images of your heart as its beating, producing both still and moving pictures of your heart and major blood vessels. For further information please visit [email or web address] Please follow the instruction sheet given to you today for more information.    Follow-Up: At [facility], you and your health needs are our priority.  As part of our continuing mission to provide you with exceptional heart care, we have created designated Provider Care Teams.  These Care Teams include your primary Cardiologist (physician) and Advanced Practice Providers (APPs -  Physician Assistants and Nurse Practitioners) who all work together to provide you with the care you need, when you need  it.  We recommend signing up for the patient portal called "MyChart".  Sign up information is provided on this After Visit Summary.  MyChart is used to connect with patients for Virtual Visits (Telemedicine).  Patients are able to view lab/test results, encounter notes, upcoming appointments, etc.  Non-urgent messages can be sent to your provider as well.   To learn more about what you can do with MyChart, go to [email or web address].    Your next appointment:   3 month(s)  The format for your next appointment:   In Person  Provider:  DR Gasper Sells   Other  Instructions NONE  Important Information About Sugar         Signed, Werner Lean, MD  12/21/2021 8:54 AM    [place]

## 2021-12-21 ENCOUNTER — Ambulatory Visit: Payer: Medicare Other | Attending: Internal Medicine | Admitting: Internal Medicine

## 2021-12-21 ENCOUNTER — Encounter: Payer: Self-pay | Admitting: Internal Medicine

## 2021-12-21 VITALS — BP 122/58 | HR 67 | Ht 67.0 in | Wt 182.0 lb

## 2021-12-21 DIAGNOSIS — Z01812 Encounter for preprocedural laboratory examination: Secondary | ICD-10-CM

## 2021-12-21 DIAGNOSIS — I422 Other hypertrophic cardiomyopathy: Secondary | ICD-10-CM | POA: Diagnosis not present

## 2021-12-21 DIAGNOSIS — R600 Localized edema: Secondary | ICD-10-CM | POA: Diagnosis not present

## 2021-12-21 DIAGNOSIS — I493 Ventricular premature depolarization: Secondary | ICD-10-CM | POA: Diagnosis not present

## 2021-12-21 DIAGNOSIS — R42 Dizziness and giddiness: Secondary | ICD-10-CM

## 2021-12-21 DIAGNOSIS — Z951 Presence of aortocoronary bypass graft: Secondary | ICD-10-CM | POA: Diagnosis not present

## 2021-12-21 LAB — CBC
Hematocrit: 36.4 % — ABNORMAL LOW (ref 37.5–51.0)
Hemoglobin: 12.2 g/dL — ABNORMAL LOW (ref 13.0–17.7)
MCH: 28.6 pg (ref 26.6–33.0)
MCHC: 33.5 g/dL (ref 31.5–35.7)
MCV: 85 fL (ref 79–97)
Platelets: 161 10*3/uL (ref 150–450)
RBC: 4.27 x10E6/uL (ref 4.14–5.80)
RDW: 14.1 % (ref 11.6–15.4)
WBC: 6.1 10*3/uL (ref 3.4–10.8)

## 2021-12-21 NOTE — Patient Instructions (Signed)
Medication Instructions:  NO CHANGES *If you need a refill on your cardiac medications before your next appointment, please call your pharmacy*   Lab Work: TODAY CBC If you have labs (blood work) drawn today and your tests are completely normal, you will receive your results only by: [place] (if you have MyChart) OR A paper copy in the mail If you have any lab test that is abnormal or we need to change your treatment, we will call you to review the results.   Testing/Procedure Your physician has requested that you have a cardiac MRI. Cardiac MRI uses a computer to create images of your heart as its beating, producing both still and moving pictures of your heart and major blood vessels. For further information please visit [email or web address] Please follow the instruction sheet given to you today for more information.    Follow-Up: At [facility], you and your health needs are our priority.  As part of our continuing mission to provide you with exceptional heart care, we have created designated Provider Care Teams.  These Care Teams include your primary Cardiologist (physician) and Advanced Practice Providers (APPs -  Physician Assistants and Nurse Practitioners) who all work together to provide you with the care you need, when you need it.  We recommend signing up for the patient portal called "MyChart".  Sign up information is provided on this After Visit Summary.  MyChart is used to connect with patients for Virtual Visits (Telemedicine).  Patients are able to view lab/test results, encounter notes, upcoming appointments, etc.  Non-urgent messages can be sent to your provider as well.   To learn more about what you can do with MyChart, go to [email or web address].    Your next appointment:   3 month(s)  The format for your next appointment:   In Person  Provider:  DR Gasper Sells   Other Instructions NONE  Important Information About Sugar

## 2021-12-22 ENCOUNTER — Other Ambulatory Visit: Payer: Self-pay | Admitting: Cardiovascular Disease

## 2021-12-29 ENCOUNTER — Other Ambulatory Visit: Payer: Self-pay | Admitting: Family Medicine

## 2021-12-30 ENCOUNTER — Ambulatory Visit: Payer: Medicare Other | Admitting: Cardiovascular Disease

## 2022-01-01 NOTE — Progress Notes (Unsigned)
01/04/2022 Jerry Farrell [id number] Oct 09, 1945  Referring provider: Abner Greenspan, MD Primary GI doctor: Dr. Fuller Plan  ASSESSMENT AND PLAN:   Assessment: 76 y.o. male here for assessment of the following: 1. Hx of colonic polyps   2. Gastritis, presence of bleeding unspecified, unspecified chronicity, unspecified gastritis type   3. Constipation, unspecified constipation type   4. Acute blood loss anemia   07/2015 colonoscopy for history of adenomatous polyps 4 mm tubular adenoma no further colon polyp surveillance due to age 22/25/2023 EGD with Dr. Fuller Plan for reflux which showed normal esophagus, Billroth I with healthy-appearing mucosa, greater curvature clip, erythematous mucosa in stomach, normal duodenum .   Biopsy showed mild chronic gastritis, no malignancy. 11/22/2021 small bowel enteroscopy to evaluate anemia and hypotension showed torturous esophagus, clotted blood in gastric body, nonbleeding gastric ulcers with visible vessel clips were placed, these ulcers likely secondary to gastric biopsies taken July 25 for the biopsies were not taken, patent Billroth I gastroduodenostomy healthy-appearing mucosa, normal duodenum and jejunum Patient is on Protonix twice daily, iron once daily, last hemoglobin 12.2 on 12/21/2021.  No signs of rectal bleeding.  Plan: Continue pantoprazole twice daily until the end of this month then can be on it once daily.  Emphasized taking prior to meals Discussed GERD regiment, avoid NSAIDs. No further signs of rectal bleeding, patient's not due for screening colonoscopy secondary age If patient continues to have anemia or has any new rectal bleeding can consider colonoscopy otherwise continue the same. Follow-up in 6 months per patient request.  No orders of the defined types were placed in this encounter.   No orders of the defined types were placed in this encounter.   History of Present Illness:  76 y.o. male with a past medical history of  hypertension, hyperlipidemia, coronary disease status post single-vessel CABG 94, history of gallstones, chronic back pain, peptic ulcer disease status post partial gastrectomy 1980s, previous H. pylori treatment 2018, history of GI bleed secondary to gastric ulcer 2018, history of colon polyps and others listed below, presents for hospital follow up.   Patient was in the hospital from 07/30 to 08/03, was in the hospital for anemia and hypotension with presyncope. 07/2015 colonoscopy for history of adenomatous polyps 4 mm tubular adenoma no further colon polyp surveillance due to age 22/25/2023 EGD with Dr. Fuller Plan for reflux which showed normal esophagus, Billroth I with healthy-appearing mucosa, greater curvature clip, erythematous mucosa in stomach, normal duodenum .   Biopsy showed mild chronic gastritis, no malignancy. 11/22/2021 small bowel enteroscopy to evaluate anemia and hypotension showed torturous esophagus, clotted blood in gastric body, nonbleeding gastric ulcers with visible vessel clips were placed, these ulcers likely secondary to gastric biopsies taken July 25 for the biopsies were not taken, patent Billroth I gastroduodenostomy healthy-appearing mucosa, normal duodenum and jejunum  11/25/2021 ER visit for palpitations, mild hypokalemia repleted, given liter of fluid, hemoglobin at that time 8.7, which was stable from discharge. 12/21/2021 hemoglobin 12.2 He has been on iron supplement once a day since discharge and handling well. Has had BM once a day, occ 2 x a day, has increased water. No melena, no hematochezia.  Denies any AB pain.  States he has been eating better, has avoided greasy/fried foods.  He is still on pantoprazole twice a day and famotidine, has not needed the gavescon.  No more palpitations, chest pain, SOB, dizziness.  His iron was 43, ferritin 25 08/03.    EGD 06/07/2016 during his hospital admission  for GI bleed and anemia: - Normal esophagus. - Billroth I  gastroduodenostomy was found. - Gastric ulcer with adherent clot. Clips (MR conditional) were placed. - Normal examined duodenum. - No specimens collected.   EGD 06/01/2016 done due to having dysphagia: - No endoscopic esophageal abnormality to explain patient's dysphagia. Esophagus dilated. - Patent Billroth I gastroduodenostomy was found, characterized by erythema. - Gastritis. Biopsied. - Normal second portion of the duodenum. - CHRONIC GASTRITIS. - WARTHIN-STARRY STAIN POSITIVE FOR HELICOBACTER PYLORI.   Colonoscopy 02/15/2016: -One 4 mm polyp in the cecum, removed with a cold biopsy forceps. Resected and retrieved. - Internal hemorrhoids. - The examination was otherwise normal on direct and retroflexion views - 5 years - TUBULAR ADENOMA. NO HIGH GRADE DYSPLASIA OR MALIGNANCY IDENTIFIED.   Current Medications:    Current Outpatient Medications (Cardiovascular):    amLODipine (NORVASC) 5 MG tablet, TAKE 1/2 OF A TABLET BY MOUTH 2 TIMES DAILY.   atorvastatin (LIPITOR) 40 MG tablet, Take 1 tablet (40 mg total) by mouth daily.   carvedilol (COREG) 6.25 MG tablet, Take 1 tablet (6.25 mg total) by mouth 2 (two) times daily with a meal.   doxazosin (CARDURA) 4 MG tablet, TAKE 1 TABLET EVERY DAY   hydrALAZINE (APRESOLINE) 25 MG tablet, Take 1 tablet (25 mg total) by mouth 2 (two) times daily.   valsartan (DIOVAN) 320 MG tablet, TAKE 1 TABLET BY MOUTH EVERY DAY   Current Outpatient Medications (Analgesics):    acetaminophen (TYLENOL) 500 MG tablet, Take 1,000 mg by mouth every 6 (six) hours as needed for mild pain or headache.   aspirin EC 81 MG tablet, Take 1 tablet (81 mg total) by mouth daily.  Current Outpatient Medications (Hematological):    ferrous sulfate 325 (65 FE) MG EC tablet, Take 1 tablet (325 mg total) by mouth daily with breakfast.  Current Outpatient Medications (Other):    aluminum hydroxide-magnesium carbonate (GAVISCON) 95-358 MG/15ML SUSP, Take 15 mLs by mouth  as needed for indigestion.   famotidine (PEPCID) 20 MG tablet, TAKE 1 TABLET BY MOUTH TWICE A DAY   pantoprazole (PROTONIX) 40 MG tablet, Take 1 tablet (40 mg total) by mouth 2 (two) times daily.   [START ON 01/20/2022] pantoprazole (PROTONIX) 40 MG tablet, Take 1 tablet (40 mg total) by mouth daily.   SYSTANE BALANCE 0.6 % SOLN, Place 1 drop into both eyes 3 (three) times daily as needed (for dryness).   tamsulosin (FLOMAX) 0.4 MG CAPS capsule, TAKE 1 CAPSULES BY MOUTH ONCE DAILY.  Surgical History:  He  has a past surgical history that includes Gastrectomy; Coronary artery bypass graft (1998); Pars plana vitrectomy (03/13/2012); Membrane peel (03/13/2012); Cataract extraction w/PHACO (03/13/2012); Esophagogastroduodenoscopy (egd) with propofol (N/A, 06/07/2016); LEFT HEART CATH AND CORS/GRAFTS ANGIOGRAPHY (N/A, 12/02/2020); enteroscopy (N/A, 11/22/2021); and Hemostasis clip placement (11/22/2021).  Current Medications, Allergies, Past Medical History, Past Surgical History, Family History and Social History were reviewed in [employer] record.  Physical Exam: BP 110/62   Pulse 68   Ht '5\' 7"'$  (1.702 m)   Wt 179 lb (81.2 kg)   PF 97 L/min   BMI 28.04 kg/m  General:   Pleasant, well developed male in no acute distress Heart : Regular rate and rhythm; no murmurs Pulm: Clear anteriorly; no wheezing Abdomen:  Soft, Obese AB, Active bowel sounds. No tenderness . , No organomegaly appreciated. Rectal: Not evaluated Extremities:  without  edema. Neurologic:  Alert and  oriented x4;  No focal deficits.  Psych:  Cooperative. Normal mood and affect.   Vladimir Crofts, PA-C 01/04/22

## 2022-01-04 ENCOUNTER — Encounter: Payer: Self-pay | Admitting: Physician Assistant

## 2022-01-04 ENCOUNTER — Ambulatory Visit: Payer: Medicare Other | Admitting: Physician Assistant

## 2022-01-04 VITALS — BP 110/62 | HR 68 | Ht 67.0 in | Wt 179.0 lb

## 2022-01-04 DIAGNOSIS — D62 Acute posthemorrhagic anemia: Secondary | ICD-10-CM

## 2022-01-04 DIAGNOSIS — Z86010 Personal history of colonic polyps: Secondary | ICD-10-CM | POA: Diagnosis not present

## 2022-01-04 DIAGNOSIS — K2970 Gastritis, unspecified, without bleeding: Secondary | ICD-10-CM | POA: Diagnosis not present

## 2022-01-04 DIAGNOSIS — K5900 Constipation, unspecified: Secondary | ICD-10-CM | POA: Diagnosis not present

## 2022-01-04 NOTE — Patient Instructions (Addendum)
_______________________________________________________  If you are age 78 or older, your body mass index should be between 23-30. Your Body mass index is 28.04 kg/m. If this is out of the aforementioned range listed, please consider follow up with your Primary Care Provider.  If you are age 24 or younger, your body mass index should be between 19-25. Your Body mass index is 28.04 kg/m. If this is out of the aformentioned range listed, please consider follow up with your Primary Care Provider.   ________________________________________________________  The [place] [facility] providers would like to encourage you to use [facility] to communicate with providers for non-urgent requests or questions.  Due to long hold times on the telephone, sending your provider a message by [facility] may be a faster and more efficient way to get a response.  Please allow 48 business hours for a response.  Please remember that this is for non-urgent requests.  _______________________________________________________   Please take your proton pump inhibitor medication before food, can cut back to once a day at the end of this month or in 4 weeks.   Can always increase back up to 2 a day if any reflux or symptoms, can continue the famotidine as needed.  Avoid spicy and acidic foods Avoid fatty foods Limit your intake of coffee, tea, alcohol, and carbonated drinks Work to maintain a healthy weight Keep the head of the bed elevated at least 3 inches with blocks or a wedge pillow if you are having any nighttime symptoms Stay upright for 2 hours after eating Avoid meals and snacks three to four hours before bedtime  Call the office if any rectal bleeding or continuing anemia.   Please follow up in 6 months with Dr Fuller Plan. Give [place] a call at [phone] to schedule an appointment.  It was a pleasure to see you today!  Thank you for trusting me with your gastrointestinal care!

## 2022-01-09 ENCOUNTER — Telehealth: Payer: Self-pay | Admitting: Physician Assistant

## 2022-01-09 NOTE — Telephone Encounter (Signed)
Inbound call from pt states he is having a stomach flare up and wanted to schedule appt, next avail with APP was 10/16. States he ate [place] food that probably gave him those symptoms. Pt is requesting a call to further advise.

## 2022-01-09 NOTE — Telephone Encounter (Signed)
Patient called in stating he was experiencing abdominal pain & loose stools on 01/05/22 after eating chinese food. Since then his pain has resolved, and bowel movements are normal. Denies any n/v. States he has a poor appetite. Patient has been advised to continue to push fluids & eat a bland diet for the next couple of days as things resolve. If no better by Wednesday or symptoms worsen, he has been advised to call back. Pt verbalized all understanding.

## 2022-01-10 ENCOUNTER — Other Ambulatory Visit: Payer: Self-pay | Admitting: Cardiovascular Disease

## 2022-01-20 ENCOUNTER — Other Ambulatory Visit: Payer: Self-pay | Admitting: Cardiovascular Disease

## 2022-02-12 ENCOUNTER — Other Ambulatory Visit: Payer: Self-pay | Admitting: Family Medicine

## 2022-02-13 ENCOUNTER — Other Ambulatory Visit (HOSPITAL_BASED_OUTPATIENT_CLINIC_OR_DEPARTMENT_OTHER): Payer: Self-pay

## 2022-02-13 MED ORDER — COMIRNATY 30 MCG/0.3ML IM SUSY
PREFILLED_SYRINGE | INTRAMUSCULAR | 0 refills | Status: DC
  Filled 2022-02-13: qty 0.3, 1d supply, fill #0

## 2022-03-07 ENCOUNTER — Telehealth (HOSPITAL_COMMUNITY): Payer: Self-pay | Admitting: *Deleted

## 2022-03-07 NOTE — Telephone Encounter (Signed)
Reaching out to patient to offer assistance regarding upcoming cardiac imaging study; pt verbalizes understanding of appt date/time, parking situation and where to check in, and verified current allergies; name and call back number provided for further questions should they arise  Gordy Clement RN Navigator Cardiac [place] [facility] [phone] office [phone] cell  Patient reports having an MRI in the past without issue.

## 2022-03-08 ENCOUNTER — Other Ambulatory Visit: Payer: Self-pay | Admitting: Internal Medicine

## 2022-03-08 ENCOUNTER — Ambulatory Visit (HOSPITAL_COMMUNITY)
Admission: RE | Admit: 2022-03-08 | Discharge: 2022-03-08 | Disposition: A | Discharge status: Home / Self Care | Payer: Medicare Other | Source: Ambulatory Visit | Attending: Internal Medicine | Admitting: Internal Medicine

## 2022-03-08 DIAGNOSIS — I422 Other hypertrophic cardiomyopathy: Secondary | ICD-10-CM

## 2022-03-08 DIAGNOSIS — R42 Dizziness and giddiness: Secondary | ICD-10-CM

## 2022-03-08 DIAGNOSIS — I517 Cardiomegaly: Secondary | ICD-10-CM

## 2022-03-08 DIAGNOSIS — Z01812 Encounter for preprocedural laboratory examination: Secondary | ICD-10-CM

## 2022-03-08 DIAGNOSIS — I493 Ventricular premature depolarization: Secondary | ICD-10-CM

## 2022-03-08 DIAGNOSIS — Z951 Presence of aortocoronary bypass graft: Secondary | ICD-10-CM

## 2022-03-08 DIAGNOSIS — R600 Localized edema: Secondary | ICD-10-CM

## 2022-03-15 ENCOUNTER — Encounter: Payer: Self-pay | Admitting: Internal Medicine

## 2022-03-15 ENCOUNTER — Ambulatory Visit: Payer: Medicare Other | Attending: Internal Medicine | Admitting: Internal Medicine

## 2022-03-15 VITALS — BP 136/80 | HR 71 | Ht 67.0 in | Wt 181.0 lb

## 2022-03-15 DIAGNOSIS — I25118 Atherosclerotic heart disease of native coronary artery with other forms of angina pectoris: Secondary | ICD-10-CM | POA: Diagnosis not present

## 2022-03-15 DIAGNOSIS — I493 Ventricular premature depolarization: Secondary | ICD-10-CM | POA: Diagnosis not present

## 2022-03-15 DIAGNOSIS — I10 Essential (primary) hypertension: Secondary | ICD-10-CM | POA: Diagnosis not present

## 2022-03-15 NOTE — Progress Notes (Signed)
Cardiology Office Note:    Date:  03/15/2022   ID:  Jerry Farrell, Jerry Farrell 20-Oct-1945, MRN [id number]  PCP:  Abner Greenspan, MD   [facility] Cardiologist:  Quay Burow, MD     Referring MD: Abner Greenspan, MD   CC: follow up CMR  History of Present Illness:    Jerry Farrell is a 76 y.o. male with a hx of CAD s/p CABG 1994, who presents for evaluation for septal hypertrophy with no LVOT Obstruction. 2023: seen in 2023 after bleeding ulcer.  Found to have LVOT gradient; query of septal thickness of 19 mm.  Seen for HCM eval.  CMR eval negative.  Could not do contrast because of his clips.  Patient notes that he is doing well.   Since last visit notes no further fatigue . There are no interval hospital/ED visit.   He found the MRI to be a bit nerve wracking  No chest pain or pressure .  No SOB/DOE and no PND/Orthopnea.  No weight gain or leg swelling.  No palpitations or syncope .  Fatigue is improved.  Going to see his son and daugther in [place] for Thanksgiving.  Ambulatory blood pressure not done.    Past Medical History:  Diagnosis Date   Adenomatous colon polyp    BPH (benign prostatic hyperplasia)    CAD (coronary artery disease)    cardiolite ok 3/05, stress test- low risk study 11/06   Chronic low back pain    with radiculopathy   ED (erectile dysfunction)    Gallstones    abd [place]- stable hamangioma, gallbladder sludge and tiny stones 09/2003   Gastritis    GERD (gastroesophageal reflux disease)    H pylori ulcer    Hyperlipidemia    Hypertension    Transfusion history     Past Surgical History:  Procedure Laterality Date   CATARACT EXTRACTION W/PHACO  03/13/2012   Procedure: CATARACT EXTRACTION PHACO AND INTRAOCULAR LENS PLACEMENT ([place]);  Surgeon: Adonis Brook, MD;  Location: [facility];  Service: Ophthalmology;  Laterality: Left;   CORONARY ARTERY BYPASS GRAFT  1998   x 1  done here [facility]   ENTEROSCOPY N/A 11/22/2021   Procedure:  ENTEROSCOPY;  Surgeon: Daryel November, MD;  Location: [facility];  Service: Gastroenterology;  Laterality: N/A;   ESOPHAGOGASTRODUODENOSCOPY (EGD) WITH PROPOFOL N/A 06/07/2016   Procedure: ESOPHAGOGASTRODUODENOSCOPY (EGD) WITH PROPOFOL;  Surgeon: Mauri Pole, MD;  Location: [facility];  Service: Endoscopy;  Laterality: N/A;   GASTRECTOMY     partial   HEMOSTASIS CLIP PLACEMENT  11/22/2021   Procedure: HEMOSTASIS CLIP PLACEMENT;  Surgeon: Daryel November, MD;  Location: [facility];  Service: Gastroenterology;;   LEFT HEART CATH AND CORS/GRAFTS ANGIOGRAPHY N/A 12/02/2020   Procedure: LEFT HEART CATH AND CORS/GRAFTS ANGIOGRAPHY;  Surgeon: Lorretta Harp, MD;  Location: [facility];  Service: Cardiovascular;  Laterality: N/A;   MEMBRANE PEEL  03/13/2012   Procedure: MEMBRANE PEEL;  Surgeon: Adonis Brook, MD;  Location: [facility];  Service: Ophthalmology;  Laterality: Left;   PARS PLANA VITRECTOMY  03/13/2012   Procedure: PARS PLANA VITRECTOMY WITH 23 GAUGE;  Surgeon: Adonis Brook, MD;  Location: [facility];  Service: Ophthalmology;  Laterality: Left;    Current Medications: Current Meds  Medication Sig   acetaminophen (TYLENOL) 500 MG tablet Take 1,000 mg by mouth every 6 (six) hours as needed for mild pain or headache.   aluminum hydroxide-magnesium carbonate (GAVISCON) 95-358 MG/15ML  SUSP Take 15 mLs by mouth as needed for indigestion.   amLODipine (NORVASC) 5 MG tablet TAKE 1/2 OF A TABLET BY MOUTH 2 TIMES DAILY.   aspirin EC 81 MG tablet Take 1 tablet (81 mg total) by mouth daily.   atorvastatin (LIPITOR) 40 MG tablet TAKE 1 TABLET BY MOUTH EVERY DAY   carvedilol (COREG) 6.25 MG tablet Take 1 tablet (6.25 mg total) by mouth 2 (two) times daily with a meal.   COVID-19 mRNA vaccine 2023-2024 (COMIRNATY) syringe Inject into the muscle.   doxazosin (CARDURA) 4 MG tablet TAKE 1 TABLET EVERY DAY   famotidine (PEPCID) 20 MG tablet TAKE 1 TABLET BY MOUTH TWICE A DAY   ferrous  sulfate 325 (65 FE) MG EC tablet Take 1 tablet (325 mg total) by mouth daily with breakfast.   hydrALAZINE (APRESOLINE) 25 MG tablet TAKE 1 TABLET BY MOUTH TWICE A DAY   SYSTANE BALANCE 0.6 % SOLN Place 1 drop into both eyes 3 (three) times daily as needed (for dryness).   tamsulosin (FLOMAX) 0.4 MG CAPS capsule TAKE 1 CAPSULES BY MOUTH ONCE DAILY.   valsartan (DIOVAN) 320 MG tablet TAKE 1 TABLET BY MOUTH EVERY DAY   [DISCONTINUED] pantoprazole (PROTONIX) 40 MG tablet Take 1 tablet (40 mg total) by mouth daily.     Allergies:   Patient has no known allergies.   Social History   Socioeconomic History   Marital status: Married    Spouse name: Not on file   Number of children: 3   Years of education: Not on file   Highest education level: Not on file  Occupational History   Occupation: [employer]    Employer: RETIRED  Tobacco Use   Smoking status: Never   Smokeless tobacco: Never  Vaping Use   Vaping Use: Never used  Substance and Sexual Activity   Alcohol use: No    Alcohol/week: 0.0 standard drinks of alcohol   Drug use: No   Sexual activity: Yes  Other Topics Concern   Not on file  Social History Narrative   Not on file   Social Determinants of Health   Financial Resource Strain: Low Risk  (06/24/2021)   Overall Financial Resource Strain (CARDIA)    Difficulty of Paying Living Expenses: Not hard at all  Food Insecurity: No Food Insecurity (06/24/2021)   Hunger Vital Sign    Worried About Running Out of Food in the Last Year: Never true    [place] in the Last Year: Never true  Transportation Needs: No Transportation Needs (06/24/2021)   PRAPARE - Hydrologist (Medical): No    Lack of Transportation (Non-Medical): No  Physical Activity: Unknown (06/24/2021)   Exercise Vital Sign    Days of Exercise per Week: 3 days    Minutes of Exercise per Session: Not on file  Stress: No Stress Concern Present (06/24/2021)   [place]    Feeling of Stress : Not at all  Social Connections: [place] (06/24/2021)   Social Connection and Isolation Panel [NHANES]    Frequency of Communication with Friends and Family: More than three times a week    Frequency of Social Gatherings with Friends and Family: More than three times a week    Attends Religious Services: More than 4 times per year    Active Member of [employer] or Organizations: Yes    Attends Archivist Meetings: More than 4  times per year    Marital Status: Married     Family History: The patient's family history includes Colon cancer in his brother; Hypertension in his mother; Throat cancer in his brother. There is no history of Stomach cancer or Pancreatic cancer.  ROS:   Please see the history of present illness.     All other systems reviewed and are negative.  EKGs/Labs/Other Studies Reviewed:    The following studies were reviewed today:  Cardiac Studies & Procedures   CARDIAC CATHETERIZATION  CARDIAC CATHETERIZATION 12/02/2020  Narrative Images from the original result were not included.    Ost RCA to Prox RCA lesion is 100% stenosed.   Ost LAD to Mid LAD lesion is 100% stenosed.   2nd Diag lesion is 100% stenosed.   Origin to Prox Graft lesion is 100% stenosed.   Ramus lesion is 50% stenosed.   The left ventricular systolic function is normal.   The left ventricular ejection fraction is 55-65% by visual estimate.  Jerry Farrell is a 76 y.o. male   [id number] LOCATION:  FACILITY: [facility] PHYSICIAN: Quay Burow, M.D. [birth year]   DATE OF PROCEDURE:  12/02/2020  DATE OF DISCHARGE:     CARDIAC CATHETERIZATION    History obtained from chart review.Jerry Farrell is a 76 y.o.   thin-appearing married African-American male father of 71, grandfather and 5 grandchildren who is referred back to me by Dr. Glori Bickers, his PCP, to be reestablished in my practice. I last saw him  in the office 06/18/2020.Marland Kitchen His risk factors include treated hypertension and hyperlipidemia. There is is a family history of a brother who recently had bypass surgery. He has never smoked. Retired from [employer]. He had coronary artery bypass grafting X 1  in 1994 for what sounds like left main disease. He denies chest pain or shortness of breath. He did see how main in the office 06/02/16 after having had a Myoview stress test 04/11/16 that was essentially normal with no evidence of ischemia.  Since I saw him in the office 6 months ago he has developed exertional chest tightness and dyspnea as well as fatigue.  He did not have the symptoms when I saw him back in February.   PROCEDURE DESCRIPTION:  The patient was brought to the second floor [facility] cath lab in the postabsorptive state. He was not premedicated . His right groin was prepped and shaved in usual sterile fashion. Xylocaine 1% was used for local anesthesia. A 5 upgraded to 6 French sheath was inserted into the right common femoral artery using standard Seldinger technique.  5 French right left Judkins diagnostic catheters over the 5 French pigtail catheter were used for selective coronary angiography, selective vein graft and IMA graft angiography and left ventriculography.  Isovue dye was used for the entirety of the case (130 cc of contrast total administered to patient).  Retrograde aortic, left ventricular and pullback pressures were recorded.  A RFR was performed on the ramus branch because of a long moderate proximal segmental lesion.  The patient received a total of 9000's of heparin with an ACT of 312.  Using a 6 [place] XB 3-1/2 cm guide catheter along with a Abbott FloWire, the wire was normalized and passed across the proximal segmental ramus branch disease segment.  The RFR on 2 occasions was 0.92 suggesting this was not physiologically significant.  A right common femoral angiogram was performed and a MYNX  closure device  was successfully deployed achieving hemostasis.  Impression  Mr. Holsopple has an occluded RCA as well as an RCA vein graft with grade 2-3 left to right collaterals with IMA injection through LAD septal perforators.  He is IMEs widely patent.  He does have moderate segmental proximal ramus branch disease with a negative RFR 0.92 suggesting this was not physiologically significant.  He has normal LV function.  Medical therapy will be recommended.  A Mynx closure device was successfully deployed.  The patient left lab in stable condition.  Plans will be for discharge home later today as an outpatient.  I will see him back in the office in 2 to 3 weeks.  Quay Burow. MD, [facility] 12/02/2020 2:42 PM  Findings Coronary Findings Diagnostic  Dominance: Right  Left Anterior Descending Ost LAD to Mid LAD lesion is 100% stenosed.  Second Diagonal Branch Collaterals 2nd Diag filled by collaterals from Dist Cx.  2nd Diag lesion is 100% stenosed.  Ramus Intermedius Ramus lesion is 50% stenosed.  Right Coronary Artery Ost RCA to Prox RCA lesion is 100% stenosed. Vessel is not the culprit lesion.  Graft To RPAV Origin to Prox Graft lesion is 100% stenosed.  LIMA Graft To Dist LAD  Intervention  No interventions have been documented.   STRESS TESTS  MYOCARDIAL PERFUSION IMAGING 04/11/2016  Narrative  The left ventricular ejection fraction is normal (55-65%).  Nuclear stress EF: 63%.  Blood pressure demonstrated a hypertensive response to exercise.  Horizontal ST segment depression ST segment depression of 4 mm was noted during stress in the II, III, aVF, V6, V5 and V4 leads, and returning to baseline after 1-5 minutes of recovery.  This is a low risk study.  Normal perfusion. Mildly reduced exercise capacity with hypertensive response to exercise. Significant horizontal ST depression of 4 mm inferolaterally in the setting of LVH - may represent repolarization abnormality given normal  perfusion. LVEF 63% with normal wall motion. This is a low risk study.   ECHOCARDIOGRAM  ECHOCARDIOGRAM COMPLETE 11/21/2021  Narrative ECHOCARDIOGRAM REPORT    Patient Name:   Jerry Farrell Date of Exam: 11/21/2021 Medical Rec #:  [id number]      Height:       67.0 in Accession #:    [id number]     Weight:       185.0 lb Date of Birth:  [birth year]      BSA:          1.956 m Patient Age:    18 years       BP:           95/60 mmHg Patient Gender: M              HR:           87 bpm. Exam Location:  Inpatient  Procedure: 2D Echo, Cardiac Doppler and Color Doppler  Indications:     Syncope  History:         Patient has no prior history of Echocardiogram examinations. CAD.  Sonographer:     Jefferey Pica Referring Phys:  [id number] Rhetta Mura Diagnosing Phys: Gwyndolyn Kaufman MD  IMPRESSIONS   1. Left ventricular ejection fraction, by estimation, is 70 to 75%. The left ventricle has normal function. The left ventricle has no regional wall motion abnormalities. There is severe hypertrophy of the LV septum measuring 1.9cm. The rest of the LV segments demonstrate moderate left ventricular hypertrophy. Left ventricular diastolic parameters are consistent with Grade I diastolic dysfunction (impaired relaxation). LV internal cavity appears small and  underfilled. There is an intracavitary gradient measuring 73mHg without significant LVOT gradient/obstruction. 2. Right ventricular systolic function is normal. The right ventricular size is normal. 3. The mitral valve is normal in structure. No evidence of mitral valve regurgitation. No evidence of mitral stenosis. 4. The aortic valve is tricuspid. There is mild calcification of the aortic valve. There is mild thickening of the aortic valve. Aortic valve regurgitation is trivial. Aortic valve sclerosis/calcification is present, without any evidence of aortic stenosis. 5. Consider CMR to work-up for possible HCM given severe septal  hypertrophy.  Comparison(s): No prior Echocardiogram.  FINDINGS Left Ventricle: Left ventricular ejection fraction, by estimation, is 70 to 75%. The left ventricle has normal function. The left ventricle has no regional wall motion abnormalities. The left ventricular internal cavity size was small. There is severe hypertrophy of the LV septum measuring 1.9cm. The rest of the LV segments demonstrate moderate left ventricular hypertrophy. Left ventricular diastolic parameters are consistent with Grade I diastolic dysfunction (impaired relaxation).  Right Ventricle: The right ventricular size is normal. No increase in right ventricular wall thickness. Right ventricular systolic function is normal.  Left Atrium: Left atrial size was normal in size.  Right Atrium: Right atrial size was normal in size.  Pericardium: There is no evidence of pericardial effusion.  Mitral Valve: The mitral valve is normal in structure. No evidence of mitral valve regurgitation. No evidence of mitral valve stenosis.  Tricuspid Valve: The tricuspid valve is normal in structure. Tricuspid valve regurgitation is trivial.  Aortic Valve: The aortic valve is tricuspid. There is mild calcification of the aortic valve. There is mild thickening of the aortic valve. Aortic valve regurgitation is trivial. Aortic valve sclerosis/calcification is present, without any evidence of aortic stenosis. Aortic valve peak gradient measures 6.7 mmHg.  Pulmonic Valve: The pulmonic valve was not well visualized. Pulmonic valve regurgitation is trivial.  Aorta: The aortic root and ascending aorta are structurally normal, with no evidence of dilitation.  Venous: The inferior vena cava was not well visualized.  IAS/Shunts: The atrial septum is grossly normal.   LEFT VENTRICLE PLAX 2D LVIDd:         1.95 cm   Diastology LVIDs:         1.10 cm   LV e' medial:    11.50 cm/s LV PW:         1.70 cm   LV E/e' medial:  3.5 LV IVS:         2.25 cm   LV e' lateral:   11.00 cm/s LVOT diam:     2.00 cm   LV E/e' lateral: 3.7 LV SV:         57 LV SV Index:   29 LVOT Area:     3.14 cm   RIGHT VENTRICLE RV S prime:     11.20 cm/s  LEFT ATRIUM             Index        RIGHT ATRIUM           Index LA diam:        3.20 cm 1.64 cm/m   RA Area:     13.30 cm LA Vol (A2C):   31.7 ml 16.20 ml/m  RA Volume:   30.30 ml  15.49 ml/m LA Vol (A4C):   36.7 ml 18.76 ml/m LA Biplane Vol: 34.6 ml 17.69 ml/m AORTIC VALVE  PULMONIC VALVE AV Area (Vmax): 2.89 cm     PV Vmax:       1.16 m/s AV Vmax:        129.50 cm/s  PV Peak grad:  5.4 mmHg AV Peak Grad:   6.7 mmHg LVOT Vmax:      119.00 cm/s LVOT Vmean:     66.900 cm/s LVOT VTI:       0.180 m  AORTA Ao Root diam: 3.70 cm Ao Asc diam:  3.20 cm  MITRAL VALVE MV Area (PHT): 4.46 cm    SHUNTS MV Decel Time: 170 msec    Systemic VTI:  0.18 m MV E velocity: 40.30 cm/s  Systemic Diam: 2.00 cm MV A velocity: 68.90 cm/s MV E/A ratio:  0.58  Gwyndolyn Kaufman MD Electronically signed by Gwyndolyn Kaufman MD Signature Date/Time: 11/21/2021/11:44:13 AM    Final (Updated)    MONITORS  LONG TERM MONITOR (3-14 DAYS) 10/22/2021  Narrative Patch Wear Time:  14 days and 0 hours (2023-06-06T08:13:29-0400 to 2023-06-20T08:13:33-0400)  Patient had a min HR of 47 bpm, max HR of 167 bpm, and avg HR of 65 bpm. Predominant underlying rhythm was Sinus Rhythm. 11 Supraventricular Tachycardia runs occurred, the run with the fastest interval lasting 1 min 8 secs with a max rate of 167 bpm (avg 159 bpm); the run with the fastest interval was also the longest. Isolated SVEs were rare (<1.0%), SVE Couplets were rare (<1.0%), and SVE Triplets were rare (<1.0%). Isolated VEs were frequent (5.3%, [id number]), VE Couplets were rare (<1.0%, 49), and no VE Triplets were present. Ventricular Bigeminy and Trigeminy were present.  1. SR/SB/ST 2. Freq PVCs 3. Runs of SVT 4. ROV to discuss            CMR: Date: 03/08/22 Results: WE could not use contrast because of homoeostasis clip artifacts FINDINGS: 1. Qualitatively small left ventricular size, with LVEDD 40 mm, and LVEDVi 54 mL/m2.   Moderate asymmetric septal hypertrophy, with intraventricular septal thickness of 14 mm, posterior wall thickness of 8 mm, and myocardial mass index of 51 g/m2.   Normal left ventricular systolic function (LVEF =70%). There are no regional wall motion abnormalities. There is no supine LVOT gradient.   Left ventricular parametric mapping notable for Native T1.   2. Normal right ventricular size with RVEDVI 64 mL/m2.   Normal right ventricular thickness.   Normal right ventricular systolic function (RVEF =01%). There are no regional wall motion abnormalities or aneurysms.   3. Mild left atrial dilation. Qualitatively normal right atrial size.   4. Normal size of the aortic root, ascending aorta and pulmonary artery.   5. Valve assessment:   Aortic Valve: Qualitatively mild regurgitation.   Pulmonic Valve: Qualitatively mild regurgitation.   Tricuspid Valve:Qualitatively mild regurgitation.   Mitral Valve: There is mild regurgitation. Regurgitant fraction 15%.   6.  Normal pericardium.  No pericardial effusion.   7. Grossly, no extracardiac findings. Recommended dedicated study if concerned for non-cardiac pathology.   8. Hemostasis Clip artifacts noted. Prior median sternotomy wires noted. Hepatic densities incompletely imaged on scout images correlate to prior findings of hepatic cysts.   IMPRESSION: Study does not meet diagnostic criteria for hypertrophic cardiomyopathy    VAS US CAROTID DUPLEX BILATERAL 11/21/2021  Narrative Carotid Arterial Duplex Study  Patient Name:  Jerry Farrell  Date of Exam:   11/21/2021 Medical Rec #: [id number]       Accession #:    [id number] Date of Birth: [birth year]  Patient Gender: M Patient Age:   60 years Exam Location:   [facility] Procedure:      VAS US CAROTID Referring Phys: Babs Bertin   --------------------------------------------------------------------------------  Indications:       Syncope. Risk Factors:      Hypertension. Comparison Study:  No prior studies.  Performing Technologist: Oliver Hum RVT   Examination Guidelines: A complete evaluation includes B-mode imaging, spectral Doppler, color Doppler, and power Doppler as needed of all accessible portions of each vessel. Bilateral testing is considered an integral part of a complete examination. Limited examinations for reoccurring indications may be performed as noted.   Right Carotid Findings: +----------+-------+--------+--------+---------------------+-------------------+           PSV    EDV cm/sStenosisPlaque Description   Comments                      cm/s                                                            +----------+-------+--------+--------+---------------------+-------------------+ CCA Prox  130    19              smooth and           High resistant flow                                  heterogenous                             +----------+-------+--------+--------+---------------------+-------------------+ CCA Distal90     13              smooth and           High resistant flow                                  heterogenous                             +----------+-------+--------+--------+---------------------+-------------------+ ICA Prox  115    20              smooth and           High resistant flow                                  heterogenous                             +----------+-------+--------+--------+---------------------+-------------------+ ICA Distal112    27                                   Highl resistant  flow                 +----------+-------+--------+--------+---------------------+-------------------+ ECA       128    12                                                       +----------+-------+--------+--------+---------------------+-------------------+  +----------+--------+-------+--------+-------------------+           PSV cm/sEDV cmsDescribeArm Pressure (mmHG) +----------+--------+-------+--------+-------------------+ [id number]                                        +----------+--------+-------+--------+-------------------+  +---------+--------+--+--------+--+----------------------------+ VertebralPSV cm/s90EDV cm/s15High resistant and Antegrade +---------+--------+--+--------+--+----------------------------+    Left Carotid Findings: +----------+--------+--------+--------+------------------+---------------------+           PSV cm/sEDV cm/sStenosisPlaque DescriptionComments              +----------+--------+--------+--------+------------------+---------------------+ CCA Prox  188     31              smooth and        High resistant flow                                     heterogenous      noted in the proximal                                                     CCA.                  +----------+--------+--------+--------+------------------+---------------------+ CCA Distal154     26              smooth and        High resistant flow                                     heterogenous                            +----------+--------+--------+--------+------------------+---------------------+ ICA Prox  106     28              smooth and        High resistant flow                                     heterogenous                            +----------+--------+--------+--------+------------------+---------------------+ ICA Distal117     33                                High resistant flow    +----------+--------+--------+--------+------------------+---------------------+ ECA       98      6                                                       +----------+--------+--------+--------+------------------+---------------------+  +----------+--------+--------+--------+-------------------+  PSV cm/sEDV cm/sDescribeArm Pressure (mmHG) +----------+--------+--------+--------+-------------------+ Subclavian202                                         +----------+--------+--------+--------+-------------------+  +---------+--------+--+--------+-+----------------------------+ VertebralPSV cm/s53EDV cm/s5Antegrade and High resistant +---------+--------+--+--------+-+----------------------------+      Summary: Right Carotid: Velocities in the right ICA are consistent with a 1-39% stenosis.  Left Carotid: Velocities in the left ICA are consistent with a 1-39% stenosis.  Vertebrals: Bilateral vertebral arteries demonstrate high resistant flow.  *See table(s) above for measurements and observations.    Electronically signed by Orlie Pollen on 11/22/2021 at 12:46:35 PM.    Final     Recent Labs: 11/23/2021: Magnesium 1.7 11/25/2021: ALT 34; B Natriuretic Peptide 100.8 11/28/2021: BUN 12; Creat 1.11; Potassium 3.9; Sodium 140 12/21/2021: Hemoglobin 12.2; Platelets 161  Recent Lipid Panel    Component Value Date/Time   CHOL 110 09/26/2021 0956   TRIG 70 09/26/2021 0956   HDL 58 09/26/2021 0956   CHOLHDL 1.9 09/26/2021 0956   CHOLHDL 3 09/30/2020 0738   VLDL 26.0 09/30/2020 0738   LDLCALC 37 09/26/2021 0956   LDLDIRECT 80.0 05/08/2018 0920    Physical Exam:    VS:  BP 136/80   Pulse 71   Ht '5\' 7"'$  (1.702 m)   Wt 181 lb (82.1 kg)   SpO2 97%   BMI 28.35 kg/m     Wt Readings from Last 3 Encounters:  03/15/22 181 lb (82.1 kg)  01/04/22 179 lb (81.2 kg)  12/21/21 182 lb (82.6 kg)    GEN:  Well nourished, well developed in no acute  distress HEENT: Normal NECK: No JVD LYMPHATICS: No lymphadenopathy CARDIAC: RRR, no rubs, gallops; systolic murmur RESPIRATORY:  Clear to auscultation without rales, wheezing or rhonchi  ABDOMEN: Soft, non-tender, non-distended MUSCULOSKELETAL:  minimal bilateral edema; No deformity  SKIN: Warm and dry NEUROLOGIC:  Alert and oriented x 3 PSYCHIATRIC:  Normal affect   ASSESSMENT:    1. Coronary artery disease of native artery of native heart with stable angina pectoris ([place])   2. PVC's (premature ventricular contractions)   3. Essential hypertension     PLAN:    S/p CABG PVCS - moderate LV hypertrophy with LVOT gradient related to dynamic function; does not meet diagnostic criteria for HCM; he has no fhx of HCM - doing well on aspirin 81 mg - statin 40 mg, LDL at goal may be able to back off in the future - coreg 6.25 mg PO BID; in the future may increase   HTN - norvasc 5 mg - hydralazine 25 mg PO BID - valsartan 320 mg daily - ambulatory BP: rarely taken; discussed BP monitoring as a way to come off medication  PRN with me One year with Dr. Gwenlyn Found unless new symptoms   Medication Adjustments/Labs and Tests Ordered: Current medicines are reviewed at length with the patient today.  Concerns regarding medicines are outlined above.  No orders of the defined types were placed in this encounter.  No orders of the defined types were placed in this encounter.   Patient Instructions  Medication Instructions:  Your physician recommends that you continue on your current medications as directed. Please refer to the Current Medication list given to you today.  *If you need a refill on your cardiac medications before your next appointment, please call your pharmacy*  Follow-Up: At [facility], you and your health  needs are our priority.  As part of our continuing mission to provide you with exceptional heart care, we have created designated Provider Care Teams.  These  Care Teams include your primary Cardiologist (physician) and Advanced Practice Providers (APPs -  Physician Assistants and Nurse Practitioners) who all work together to provide you with the care you need, when you need it.  Your next appointment:   1 year(s)  The format for your next appointment:   In Person  Provider:   Quay Burow, MD   Important Information About Sugar         Signed, Werner Lean, MD  03/15/2022 8:42 AM    [place]

## 2022-03-15 NOTE — Patient Instructions (Signed)
Medication Instructions:  Your physician recommends that you continue on your current medications as directed. Please refer to the Current Medication list given to you today.  *If you need a refill on your cardiac medications before your next appointment, please call your pharmacy*  Follow-Up: At [facility], you and your health needs are our priority.  As part of our continuing mission to provide you with exceptional heart care, we have created designated Provider Care Teams.  These Care Teams include your primary Cardiologist (physician) and Advanced Practice Providers (APPs -  Physician Assistants and Nurse Practitioners) who all work together to provide you with the care you need, when you need it.  Your next appointment:   1 year(s)  The format for your next appointment:   In Person  Provider:   Quay Burow, MD   Important Information About Sugar

## 2022-04-17 ENCOUNTER — Other Ambulatory Visit: Payer: Self-pay | Admitting: Family Medicine

## 2022-04-19 ENCOUNTER — Other Ambulatory Visit: Payer: Self-pay | Admitting: Cardiovascular Disease

## 2022-05-20 ENCOUNTER — Other Ambulatory Visit: Payer: Self-pay | Admitting: Family Medicine

## 2022-06-19 ENCOUNTER — Encounter: Payer: Self-pay | Admitting: Gastroenterology

## 2022-06-27 ENCOUNTER — Ambulatory Visit (INDEPENDENT_AMBULATORY_CARE_PROVIDER_SITE_OTHER): Payer: Medicare Other

## 2022-06-27 VITALS — Ht 67.0 in | Wt 182.0 lb

## 2022-06-27 DIAGNOSIS — Z0000 Encounter for general adult medical examination without abnormal findings: Secondary | ICD-10-CM

## 2022-06-27 NOTE — Patient Instructions (Signed)
Jerry Farrell , Thank you for taking time to come for your Medicare Wellness Visit. I appreciate your ongoing commitment to your health goals. Please review the following plan we discussed and let me know if I can assist you in the future.   These are the goals we discussed:  Goals      Blood Pressure < 130/80     Increase physical activity     Starting 05/08/2018, I will continue to bowl for at least 2 hours one day per week.      Patient Stated     05/14/2019, I will maintain and continue medications as prescribed.      Patient Stated     06/23/2020, I will continue to to bowl on Thursdays for 2 hours and walk 2 days a week for 30 minutes.      Patient Stated     Would like to maintain current routine      Patient Stated     Start bowling again.        This is a list of the screening recommended for you and due dates:  Health Maintenance  Topic Date Due   DTaP/Tdap/Td vaccine (3 - Tdap) 11/25/2017   Zoster (Shingles) Vaccine (2 of 2) 12/07/2020   Colon Cancer Screening  02/14/2021   Flu Shot  11/22/2021   Medicare Annual Wellness Visit  06/27/2023   Pneumonia Vaccine  Completed   COVID-19 Vaccine  Completed   Hepatitis C Screening: USPSTF Recommendation to screen - Ages 18-79 yo.  Completed   HPV Vaccine  Aged Out    Advanced directives: Advance directive discussed with you today. Even though you declined this today, please call our office should you change your mind, and we can give you the proper paperwork for you to fill out.   Conditions/risks identified: Aim for 30 minutes of exercise or brisk walking, 6-8 glasses of water, and 5 servings of fruits and vegetables each day.   Next appointment: Follow up in one year for your annual wellness visit. 07/02/2023 @ 9:15 via telephone.  Preventive Care 63 Years and Older, Male  Preventive care refers to lifestyle choices and visits with your health care provider that can promote health and wellness. What does preventive care  include? A yearly physical exam. This is also called an annual well check. Dental exams once or twice a year. Routine eye exams. Ask your health care provider how often you should have your eyes checked. Personal lifestyle choices, including: Daily care of your teeth and gums. Regular physical activity. Eating a healthy diet. Avoiding tobacco and drug use. Limiting alcohol use. Practicing safe sex. Taking low doses of aspirin every day. Taking vitamin and mineral supplements as recommended by your health care provider. What happens during an annual well check? The services and screenings done by your health care provider during your annual well check will depend on your age, overall health, lifestyle risk factors, and family history of disease. Counseling  Your health care provider may ask you questions about your: Alcohol use. Tobacco use. Drug use. Emotional well-being. Home and relationship well-being. Sexual activity. Eating habits. History of falls. Memory and ability to understand (cognition). Work and work Statistician. Screening  You may have the following tests or measurements: Height, weight, and BMI. Blood pressure. Lipid and cholesterol levels. These may be checked every 5 years, or more frequently if you are over 79 years old. Skin check. Lung cancer screening. You may have this screening every year starting  at age 58 if you have a 30-pack-year history of smoking and currently smoke or have quit within the past 15 years. Fecal occult blood test (FOBT) of the stool. You may have this test every year starting at age 69. Flexible sigmoidoscopy or colonoscopy. You may have a sigmoidoscopy every 5 years or a colonoscopy every 10 years starting at age 64. Prostate cancer screening. Recommendations will vary depending on your family history and other risks. Hepatitis C blood test. Hepatitis B blood test. Sexually transmitted disease (STD) testing. Diabetes screening. This  is done by checking your blood sugar (glucose) after you have not eaten for a while (fasting). You may have this done every 1-3 years. Abdominal aortic aneurysm (AAA) screening. You may need this if you are a current or former smoker. Osteoporosis. You may be screened starting at age 79 if you are at high risk. Talk with your health care provider about your test results, treatment options, and if necessary, the need for more tests. Vaccines  Your health care provider may recommend certain vaccines, such as: Influenza vaccine. This is recommended every year. Tetanus, diphtheria, and acellular pertussis (Tdap, Td) vaccine. You may need a Td booster every 10 years. Zoster vaccine. You may need this after age 65. Pneumococcal 13-valent conjugate (PCV13) vaccine. One dose is recommended after age 79 Pneumococcal polysaccharide (PPSV23) vaccine. One dose is recommended after age 79 Talk to your health care provider about which screenings and vaccines you need and how often you need them. This information is not intended to replace advice given to you by your health care provider. Make sure you discuss any questions you have with your health care provider. Document Released: 05/07/2015 Document Revised: 12/29/2015 Document Reviewed: 02/09/2015 Elsevier Interactive Patient Education  2017 [place] Prevention in the Home Falls can cause injuries. They can happen to people of all ages. There are many things you can do to make your home safe and to help prevent falls. What can I do on the outside of my home? Regularly fix the edges of walkways and driveways and fix any cracks. Remove anything that might make you trip as you walk through a door, such as a raised step or threshold. Trim any bushes or trees on the path to your home. Use bright outdoor lighting. Clear any walking paths of anything that might make someone trip, such as rocks or tools. Regularly check to see if handrails are  loose or broken. Make sure that both sides of any steps have handrails. Any raised decks and porches should have guardrails on the edges. Have any leaves, snow, or ice cleared regularly. Use sand or salt on walking paths during winter. Clean up any spills in your garage right away. This includes oil or grease spills. What can I do in the bathroom? Use night lights. Install grab bars by the toilet and in the tub and shower. Do not use towel bars as grab bars. Use non-skid mats or decals in the tub or shower. If you need to sit down in the shower, use a plastic, non-slip stool. Keep the floor dry. Clean up any water that spills on the floor as soon as it happens. Remove soap buildup in the tub or shower regularly. Attach bath mats securely with double-sided non-slip rug tape. Do not have throw rugs and other things on the floor that can make you trip. What can I do in the bedroom? Use night lights. Make sure that you have a light by  your bed that is easy to reach. Do not use any sheets or blankets that are too big for your bed. They should not hang down onto the floor. Have a firm chair that has side arms. You can use this for support while you get dressed. Do not have throw rugs and other things on the floor that can make you trip. What can I do in the kitchen? Clean up any spills right away. Avoid walking on wet floors. Keep items that you use a lot in easy-to-reach places. If you need to reach something above you, use a strong step stool that has a grab bar. Keep electrical cords out of the way. Do not use floor polish or wax that makes floors slippery. If you must use wax, use non-skid floor wax. Do not have throw rugs and other things on the floor that can make you trip. What can I do with my stairs? Do not leave any items on the stairs. Make sure that there are handrails on both sides of the stairs and use them. Fix handrails that are broken or loose. Make sure that handrails are as  long as the stairways. Check any carpeting to make sure that it is firmly attached to the stairs. Fix any carpet that is loose or worn. Avoid having throw rugs at the top or bottom of the stairs. If you do have throw rugs, attach them to the floor with carpet tape. Make sure that you have a light switch at the top of the stairs and the bottom of the stairs. If you do not have them, ask someone to add them for you. What else can I do to help prevent falls? Wear shoes that: Do not have high heels. Have rubber bottoms. Are comfortable and fit you well. Are closed at the toe. Do not wear sandals. If you use a stepladder: Make sure that it is fully opened. Do not climb a closed stepladder. Make sure that both sides of the stepladder are locked into place. Ask someone to hold it for you, if possible. Clearly mark and make sure that you can see: Any grab bars or handrails. First and last steps. Where the edge of each step is. Use tools that help you move around (mobility aids) if they are needed. These include: Canes. Walkers. Scooters. Crutches. Turn on the lights when you go into a dark area. Replace any light bulbs as soon as they burn out. Set up your furniture so you have a clear path. Avoid moving your furniture around. If any of your floors are uneven, fix them. If there are any pets around you, be aware of where they are. Review your medicines with your doctor. Some medicines can make you feel dizzy. This can increase your chance of falling. Ask your doctor what other things that you can do to help prevent falls. This information is not intended to replace advice given to you by your health care provider. Make sure you discuss any questions you have with your health care provider. Document Released: 02/04/2009 Document Revised: 09/16/2015 Document Reviewed: 05/15/2014 Elsevier Interactive Patient Education  2017 [employer].

## 2022-06-27 NOTE — Progress Notes (Signed)
I connected with  Cathe Mons on 06/27/22 by a audio enabled telemedicine application and verified that I am speaking with the correct person using two identifiers.  Patient Location: Home  Provider Location: Office/Clinic  I discussed the limitations of evaluation and management by telemedicine. The patient expressed understanding and agreed to proceed.  Subjective:   Jerry Farrell is a 77 y.o. male who presents for Medicare Annual/Subsequent preventive examination.  Review of Systems      Cardiac Risk Factors include: advanced age (>56mn, >>46women);male gender;dyslipidemia;sedentary lifestyle;hypertension     Objective:    Today's Vitals   06/27/22 0905  Weight: 182 lb (82.6 kg)  Height: '5\' 7"'$  (1.702 m)   Body mass index is 28.51 kg/m.     06/27/2022    9:23 AM 11/25/2021    7:44 AM 11/20/2021    4:39 PM 09/12/2021   10:56 AM 06/24/2021    2:00 PM 12/02/2020   10:30 PM 12/02/2020   11:07 AM  Advanced Directives  Does Patient Have a Medical Advance Directive? No No No No No No No  Would patient like information on creating a medical advance directive? No - Patient declined No - Patient declined No - Patient declined No - Patient declined Yes (MAU/Ambulatory/Procedural Areas - Information given) No - Patient declined No - Patient declined    Current Medications (verified) Outpatient Encounter Medications as of 06/27/2022  Medication Sig   acetaminophen (TYLENOL) 500 MG tablet Take 1,000 mg by mouth every 6 (six) hours as needed for mild pain or headache.   aluminum hydroxide-magnesium carbonate (GAVISCON) 95-358 MG/15ML SUSP Take 15 mLs by mouth as needed for indigestion.   amLODipine (NORVASC) 5 MG tablet TAKE 1/2 OF A TABLET BY MOUTH 2 TIMES DAILY.   aspirin EC 81 MG tablet Take 1 tablet (81 mg total) by mouth daily.   atorvastatin (LIPITOR) 40 MG tablet TAKE 1 TABLET BY MOUTH EVERY DAY   carvedilol (COREG) 6.25 MG tablet Take 1 tablet (6.25 mg total) by mouth 2 (two)  times daily with a meal.   doxazosin (CARDURA) 4 MG tablet TAKE 1 TABLET BY MOUTH EVERY DAY   famotidine (PEPCID) 20 MG tablet TAKE 1 TABLET BY MOUTH TWICE A DAY   ferrous sulfate 325 (65 FE) MG EC tablet Take 1 tablet (325 mg total) by mouth daily with breakfast.   hydrALAZINE (APRESOLINE) 25 MG tablet TAKE 1 TABLET BY MOUTH TWICE A DAY   SYSTANE BALANCE 0.6 % SOLN Place 1 drop into both eyes 3 (three) times daily as needed (for dryness).   tamsulosin (FLOMAX) 0.4 MG CAPS capsule TAKE 1 CAPSULES BY MOUTH ONCE DAILY.   valsartan (DIOVAN) 320 MG tablet TAKE 1 TABLET BY MOUTH EVERY DAY   COVID-19 mRNA vaccine 2023-2024 (COMIRNATY) syringe Inject into the muscle. (Patient not taking: Reported on 06/27/2022)   pantoprazole (PROTONIX) 40 MG tablet Take 1 tablet (40 mg total) by mouth 2 (two) times daily.   No facility-administered encounter medications on file as of 06/27/2022.    Allergies (verified) Patient has no known allergies.   History: Past Medical History:  Diagnosis Date   Adenomatous colon polyp    BPH (benign prostatic hyperplasia)    CAD (coronary artery disease)    cardiolite ok 3/05, stress test- low risk study 11/06   Chronic low back pain    with radiculopathy   ED (erectile dysfunction)    Gallstones    abd UKorea stable hamangioma, gallbladder sludge and tiny  stones 09/2003   Gastritis    GERD (gastroesophageal reflux disease)    H pylori ulcer    Hyperlipidemia    Hypertension    Transfusion history    Past Surgical History:  Procedure Laterality Date   CATARACT EXTRACTION W/PHACO  03/13/2012   Procedure: CATARACT EXTRACTION PHACO AND INTRAOCULAR LENS PLACEMENT ([place]);  Surgeon: Adonis Brook, MD;  Location: [facility];  Service: Ophthalmology;  Laterality: Left;   CORONARY ARTERY BYPASS GRAFT  1998   x 1  [facility]   ENTEROSCOPY N/A 11/22/2021   Procedure: ENTEROSCOPY;  Surgeon: Daryel November, MD;  Location: [facility];  Service: Gastroenterology;   Laterality: N/A;   ESOPHAGOGASTRODUODENOSCOPY (EGD) WITH PROPOFOL N/A 06/07/2016   Procedure: ESOPHAGOGASTRODUODENOSCOPY (EGD) WITH PROPOFOL;  Surgeon: Mauri Pole, MD;  Location: [facility];  Service: Endoscopy;  Laterality: N/A;   GASTRECTOMY     partial   HEMOSTASIS CLIP PLACEMENT  11/22/2021   Procedure: HEMOSTASIS CLIP PLACEMENT;  Surgeon: Daryel November, MD;  Location: [facility];  Service: Gastroenterology;;   LEFT HEART CATH AND CORS/GRAFTS ANGIOGRAPHY N/A 12/02/2020   Procedure: LEFT HEART CATH AND CORS/GRAFTS ANGIOGRAPHY;  Surgeon: Lorretta Harp, MD;  Location: [facility];  Service: Cardiovascular;  Laterality: N/A;   MEMBRANE PEEL  03/13/2012   Procedure: MEMBRANE PEEL;  Surgeon: Adonis Brook, MD;  Location: [facility];  Service: Ophthalmology;  Laterality: Left;   PARS PLANA VITRECTOMY  03/13/2012   Procedure: PARS PLANA VITRECTOMY WITH 23 GAUGE;  Surgeon: Adonis Brook, MD;  Location: [facility];  Service: Ophthalmology;  Laterality: Left;   Family History  Problem Relation Age of Onset   Hypertension Mother    Throat cancer Brother        pt feels alcohol related   Colon cancer Brother        1/2 brother   Stomach cancer Neg Hx    Pancreatic cancer Neg Hx    Social History   Socioeconomic History   Marital status: Married    Spouse name: Not on file   Number of children: 3   Years of education: Not on file   Highest education level: Not on file  Occupational History   Occupation: [employer]    Employer: RETIRED  Tobacco Use   Smoking status: Never   Smokeless tobacco: Never  Vaping Use   Vaping Use: Never used  Substance and Sexual Activity   Alcohol use: No    Alcohol/week: 0.0 standard drinks of alcohol   Drug use: No   Sexual activity: Yes  Other Topics Concern   Not on file  Social History Narrative   Not on file   Social Determinants of Health   Financial Resource Strain: Low Risk  (06/27/2022)   Overall Financial Resource Strain  (CARDIA)    Difficulty of Paying Living Expenses: Not hard at all  Food Insecurity: No Food Insecurity (06/27/2022)   Hunger Vital Sign    Worried About Running Out of Food in the Last Year: Never true    Ran Out of Food in the Last Year: Never true  Transportation Needs: No Transportation Needs (06/27/2022)   PRAPARE - Hydrologist (Medical): No    Lack of Transportation (Non-Medical): No  Physical Activity: Inactive (06/27/2022)   Exercise Vital Sign    Days of Exercise per Week: 0 days    Minutes of Exercise per Session: 0 min  Stress: No Stress Concern Present (06/27/2022)  [employer] - Occupational Stress Questionnaire    Feeling of Stress : Not at all  Social Connections: Moderately Integrated (06/27/2022)   Social Connection and Isolation Panel [NHANES]    Frequency of Communication with Friends and Family: More than three times a week    Frequency of Social Gatherings with Friends and Family: Three times a week    Attends Religious Services: More than 4 times per year    Active Member of Clubs or Organizations: No    Attends Archivist Meetings: Never    Marital Status: Married    Tobacco Counseling Counseling given: Not Answered   Clinical Intake:  Pre-visit preparation completed: Yes  Pain : No/denies pain     Nutritional Risks: None Diabetes: No  How often do you need to have someone help you when you read instructions, pamphlets, or other written materials from your doctor or pharmacy?: 1 - Never  Diabetic? no  Interpreter Needed?: No  Information entered by :: C.Amado Andal LPN   Activities of Daily Living    06/27/2022    9:24 AM 11/21/2021    8:30 PM  In your present state of health, do you have any difficulty performing the following activities:  Hearing? 1 1  Comment getting hearing aids   Vision? 0 0  Difficulty concentrating or making decisions? 0 0  Walking or climbing stairs? 0 0   Dressing or bathing? 0 0  Doing errands, shopping? 0 0  Preparing Food and eating ? N   Using the Toilet? N   In the past six months, have you accidently leaked urine? N   Do you have problems with loss of bowel control? N   Managing your Medications? N   Managing your Finances? N   Housekeeping or managing your Housekeeping? N     Patient Care Team: Tower, Wynelle Fanny, MD as PCP - General Gwenlyn Found Pearletha Forge, MD as PCP - Cardiology (Cardiology) Lorretta Harp, MD as Consulting Physician (Cardiology) Camillo Flaming, [place] as Consulting Physician (Optometry) Daryel November, MD as Consulting Physician (Gastroenterology)  Indicate any recent Medical Services you may have received from other than [facility] providers in the past year (date may be approximate).     Assessment:   This is a routine wellness examination for [facility].  Hearing/Vision screen Hearing Screening - Comments:: No aids Vision Screening - Comments:: Wears glasses - Dr.Sun  Dietary issues and exercise activities discussed: Current Exercise Habits: The patient does not participate in regular exercise at present, Exercise limited by: None identified   Goals Addressed             This Visit's Progress    Patient Stated       Start bowling again.       Depression Screen    06/27/2022    9:22 AM 11/28/2021    3:07 PM 06/24/2021    2:03 PM 06/23/2020    2:59 PM 05/14/2019    2:02 PM 05/08/2018    8:59 AM 03/26/2017    4:41 PM  PHQ 2/9 Scores  PHQ - 2 Score 0 0 0 0 0 0 0  PHQ- 9 Score    0 0 0     Fall Risk    06/27/2022    9:15 AM 11/28/2021    3:07 PM 06/24/2021    2:01 PM 06/23/2020    2:59 PM 05/14/2019    2:01 PM  [place] in the past year?  0 0 0 0 0  Number falls in past yr: 0  0 0 0  Injury with Fall? 0  0 0 0  Risk for fall due to : No Fall Risks  No Fall Risks Medication side effect Medication side effect  Follow up Falls prevention discussed;Falls evaluation completed Falls evaluation completed  Falls prevention discussed Falls evaluation completed;Falls prevention discussed Falls evaluation completed;Falls prevention discussed    FALL RISK PREVENTION PERTAINING TO THE HOME:  Any stairs in or around the home? No  If so, are there any without handrails? No  Home free of loose throw rugs in walkways, pet beds, electrical cords, etc? Yes  Adequate lighting in your home to reduce risk of falls? Yes   ASSISTIVE DEVICES UTILIZED TO PREVENT FALLS:  Life alert? No  Use of a cane, walker or w/c? No  Grab bars in the bathroom? Yes  Shower chair or bench in shower? No  Elevated toilet seat or a handicapped toilet? No    Cognitive Function:    06/23/2020    3:01 PM 05/14/2019    2:04 PM 05/08/2018    8:59 AM 01/26/2016    8:55 AM  MMSE - Mini Mental State Exam  Orientation to time '5 5 5 5  '$ Orientation to Place '5 5 5 5  '$ Registration '3 3 3 3  '$ Attention/ Calculation 5 5 0 0  Recall '3 2 3 3  '$ Language- name 2 objects   0 0  Language- repeat '1 1 1 1  '$ Language- follow 3 step command   3 3  Language- read & follow direction   0 0  Write a sentence   0 0  Copy design   0 0  Total score   20 20        06/27/2022    9:25 AM  6CIT Screen  What Year? 0 points  What month? 0 points  What time? 0 points  Count back from 20 0 points  Months in reverse 0 points  Repeat phrase 2 points  Total Score 2 points    Immunizations Immunization History  Administered Date(s) Administered   COVID-19, mRNA, vaccine(Comirnaty)12 years and older 02/13/2022   Fluad Quad(high Dose 65+) 01/01/2019, 01/19/2020   Influenza Split 03/17/2011, 04/30/2012   Influenza, High Dose Seasonal PF 02/20/2018   Influenza,inj,Quad PF,6+ Mos 04/08/2013, 05/28/2015, 01/26/2016, 03/26/2017   Influenza-Unspecified 02/22/2014   PFIZER(Purple Top)SARS-COV-2 Vaccination 05/15/2019, 06/05/2019, 01/28/2020   Pfizer Covid-19 Vaccine Bivalent Booster 19yr & up 02/08/2021   Pneumococcal Conjugate-13 10/28/2014    Pneumococcal Polysaccharide-23 04/30/2012   Td 08/22/1997, 11/26/2007   Zoster Recombinat (Shingrix) 10/12/2020   Zoster, Live 08/19/2013    TDAP status: Due, Education has been provided regarding the importance of this vaccine. Advised may receive this vaccine at local pharmacy or Health Dept. Aware to provide a copy of the vaccination record if obtained from local pharmacy or Health Dept. Verbalized acceptance and understanding.  Flu Vaccine status: Up to date At [facility]  Pneumococcal vaccine status: Up to date  Covid-19 vaccine status: Information provided on how to obtain vaccines.   Qualifies for Shingles Vaccine? No   Zostavax completed Yes   Shingrix Completed?: Yes [facility] per pt   Screening Tests Health Maintenance  Topic Date Due   DTaP/Tdap/Td (3 - Tdap) 11/25/2017   Zoster Vaccines- Shingrix (2 of 2) 12/07/2020   COLONOSCOPY (Pts 45-458yrInsurance coverage will need to be confirmed)  02/14/2021   INFLUENZA VACCINE  11/22/2021   Medicare  Annual Wellness (AWV)  06/27/2023   Pneumonia Vaccine 37+ Years old  Completed   COVID-19 Vaccine  Completed   Hepatitis C Screening  Completed   HPV VACCINES  Aged Out    Health Maintenance  Health Maintenance Due  Topic Date Due   DTaP/Tdap/Td (3 - Tdap) 11/25/2017   Zoster Vaccines- Shingrix (2 of 2) 12/07/2020   COLONOSCOPY (Pts 45-58yr Insurance coverage will need to be confirmed)  02/14/2021   INFLUENZA VACCINE  11/22/2021    Colorectal cancer screening: No longer required.   Lung Cancer Screening: (Low Dose CT Chest recommended if Age 79-80years, 30 pack-year currently smoking OR have quit w/in 15years.) does not qualify.   Lung Cancer Screening Referral: no  Additional Screening:  Hepatitis C Screening: does not qualify; Completed 01/26/16  Vision Screening: Recommended annual ophthalmology exams for early detection of glaucoma and other disorders of the eye. Is the patient up to date with their annual  eye exam?  Yes  Who is the provider or what is the name of the office in which the patient attends annual eye exams? Dr.Sun If pt is not established with a provider, would they like to be referred to a provider to establish care? No .   Dental Screening: Recommended annual dental exams for proper oral hygiene  Community Resource Referral / Chronic Care Management: CRR required this visit?  No   CCM required this visit?  No      Plan:     I have personally reviewed and noted the following in the patient's chart:   Medical and social history Use of alcohol, tobacco or illicit drugs  Current medications and supplements including opioid prescriptions. Patient is not currently taking opioid prescriptions. Functional ability and status Nutritional status Physical activity Advanced directives List of other physicians Hospitalizations, surgeries, and ER visits in previous 12 months Vitals Screenings to include cognitive, depression, and falls Referrals and appointments  In addition, I have reviewed and discussed with patient certain preventive protocols, quality metrics, and best practice recommendations. A written personalized care plan for preventive services as well as general preventive health recommendations were provided to patient.     CLebron Conners LPN   [id number]  Nurse Notes: none

## 2022-06-30 DIAGNOSIS — H40053 Ocular hypertension, bilateral: Secondary | ICD-10-CM | POA: Diagnosis not present

## 2022-06-30 DIAGNOSIS — H35372 Puckering of macula, left eye: Secondary | ICD-10-CM | POA: Diagnosis not present

## 2022-06-30 DIAGNOSIS — H04121 Dry eye syndrome of right lacrimal gland: Secondary | ICD-10-CM | POA: Diagnosis not present

## 2022-06-30 DIAGNOSIS — H40013 Open angle with borderline findings, low risk, bilateral: Secondary | ICD-10-CM | POA: Diagnosis not present

## 2022-08-09 ENCOUNTER — Other Ambulatory Visit: Payer: Self-pay | Admitting: Cardiovascular Disease

## 2022-08-09 ENCOUNTER — Other Ambulatory Visit: Payer: Self-pay | Admitting: Family Medicine

## 2022-08-09 ENCOUNTER — Telehealth: Payer: Self-pay | Admitting: Family Medicine

## 2022-08-09 DIAGNOSIS — R7303 Prediabetes: Secondary | ICD-10-CM

## 2022-08-09 DIAGNOSIS — D696 Thrombocytopenia, unspecified: Secondary | ICD-10-CM

## 2022-08-09 DIAGNOSIS — E785 Hyperlipidemia, unspecified: Secondary | ICD-10-CM

## 2022-08-09 DIAGNOSIS — E538 Deficiency of other specified B group vitamins: Secondary | ICD-10-CM

## 2022-08-09 DIAGNOSIS — N400 Enlarged prostate without lower urinary tract symptoms: Secondary | ICD-10-CM

## 2022-08-09 DIAGNOSIS — D509 Iron deficiency anemia, unspecified: Secondary | ICD-10-CM

## 2022-08-09 DIAGNOSIS — I10 Essential (primary) hypertension: Secondary | ICD-10-CM

## 2022-08-09 NOTE — Telephone Encounter (Signed)
-----   Message from Alvina Chou sent at 07/31/2022 12:33 PM EDT ----- Regarding: Lab orders for Thursday, 4.18.24 Patient is scheduled for CPX labs, please order future labs, Thanks , Camelia Eng

## 2022-08-10 ENCOUNTER — Other Ambulatory Visit (INDEPENDENT_AMBULATORY_CARE_PROVIDER_SITE_OTHER): Payer: Medicare Other

## 2022-08-10 DIAGNOSIS — E785 Hyperlipidemia, unspecified: Secondary | ICD-10-CM

## 2022-08-10 DIAGNOSIS — D696 Thrombocytopenia, unspecified: Secondary | ICD-10-CM

## 2022-08-10 DIAGNOSIS — I10 Essential (primary) hypertension: Secondary | ICD-10-CM

## 2022-08-10 DIAGNOSIS — N400 Enlarged prostate without lower urinary tract symptoms: Secondary | ICD-10-CM

## 2022-08-10 DIAGNOSIS — D509 Iron deficiency anemia, unspecified: Secondary | ICD-10-CM

## 2022-08-10 DIAGNOSIS — R7303 Prediabetes: Secondary | ICD-10-CM | POA: Diagnosis not present

## 2022-08-10 DIAGNOSIS — E538 Deficiency of other specified B group vitamins: Secondary | ICD-10-CM

## 2022-08-10 LAB — COMPREHENSIVE METABOLIC PANEL
ALT: 32 U/L (ref 0–53)
AST: 24 U/L (ref 0–37)
Albumin: 4.2 g/dL (ref 3.5–5.2)
Alkaline Phosphatase: 116 U/L (ref 39–117)
BUN: 15 mg/dL (ref 6–23)
CO2: 27 mEq/L (ref 19–32)
Calcium: 8.6 mg/dL (ref 8.4–10.5)
Chloride: 105 mEq/L (ref 96–112)
Creatinine, Ser: 1.02 mg/dL (ref 0.40–1.50)
GFR: 70.11 mL/min (ref >60.00)
Glucose, Bld: 99 mg/dL (ref 70–99)
Potassium: 4 mEq/L (ref 3.5–5.1)
Sodium: 140 mEq/L (ref 135–145)
Total Bilirubin: 1.1 mg/dL (ref 0.2–1.2)
Total Protein: 6.5 g/dL (ref 6.0–8.3)

## 2022-08-10 LAB — CBC WITH DIFFERENTIAL/PLATELET
Basophils Absolute: 0 10*3/uL (ref 0.0–0.1)
Basophils Relative: 0.4 % (ref 0.0–3.0)
Eosinophils Absolute: 0.2 10*3/uL (ref 0.0–0.7)
Eosinophils Relative: 2.9 % (ref 0.0–5.0)
HCT: 44.6 % (ref 39.0–52.0)
Hemoglobin: 15.5 g/dL (ref 13.0–17.0)
Lymphocytes Relative: 14.1 % (ref 12.0–46.0)
Lymphs Abs: 1.1 10*3/uL (ref 0.7–4.0)
MCHC: 34.6 g/dL (ref 30.0–36.0)
MCV: 89 fl (ref 78.0–100.0)
Monocytes Absolute: 0.6 10*3/uL (ref 0.1–1.0)
Monocytes Relative: 7.9 % (ref 3.0–12.0)
Neutro Abs: 5.7 10*3/uL (ref 1.4–7.7)
Neutrophils Relative %: 74.7 % (ref 43.0–77.0)
Platelets: 131 10*3/uL — ABNORMAL LOW (ref 150.0–400.0)
RBC: 5.01 Mil/uL (ref 4.22–5.81)
RDW: 13.1 % (ref 11.5–15.5)
WBC: 7.7 10*3/uL (ref 4.0–10.5)

## 2022-08-10 LAB — LIPID PANEL
Cholesterol: 108 mg/dL (ref 0–200)
HDL: 47.3 mg/dL (ref >39.00)
LDL Cholesterol: 40 mg/dL (ref 0–99)
NonHDL: 61.06
Total CHOL/HDL Ratio: 2
Triglycerides: 104 mg/dL (ref 0.0–149.0)
VLDL: 20.8 mg/dL (ref 0.0–40.0)

## 2022-08-10 LAB — TSH: TSH: 0.62 u[IU]/mL (ref 0.35–5.50)

## 2022-08-10 LAB — IRON: Iron: 94 ug/dL (ref 42–165)

## 2022-08-10 LAB — FERRITIN: Ferritin: 46.1 ng/mL (ref 22.0–322.0)

## 2022-08-10 LAB — VITAMIN B12: Vitamin B-12: 449 pg/mL (ref 211–911)

## 2022-08-10 LAB — PSA, MEDICARE: PSA: 1.66 ng/ml (ref 0.10–4.00)

## 2022-08-10 LAB — HEMOGLOBIN A1C: Hgb A1c MFr Bld: 5.6 % (ref 4.6–6.5)

## 2022-08-17 ENCOUNTER — Encounter: Payer: Self-pay | Admitting: Family Medicine

## 2022-08-17 ENCOUNTER — Other Ambulatory Visit (HOSPITAL_BASED_OUTPATIENT_CLINIC_OR_DEPARTMENT_OTHER): Payer: Self-pay

## 2022-08-17 ENCOUNTER — Ambulatory Visit (INDEPENDENT_AMBULATORY_CARE_PROVIDER_SITE_OTHER): Payer: Medicare Other | Admitting: Family Medicine

## 2022-08-17 VITALS — BP 150/72 | HR 73 | Temp 97.9°F | Ht 67.25 in | Wt 183.5 lb

## 2022-08-17 DIAGNOSIS — I10 Essential (primary) hypertension: Secondary | ICD-10-CM

## 2022-08-17 DIAGNOSIS — R42 Dizziness and giddiness: Secondary | ICD-10-CM

## 2022-08-17 DIAGNOSIS — R7303 Prediabetes: Secondary | ICD-10-CM

## 2022-08-17 DIAGNOSIS — I739 Peripheral vascular disease, unspecified: Secondary | ICD-10-CM | POA: Diagnosis not present

## 2022-08-17 DIAGNOSIS — D696 Thrombocytopenia, unspecified: Secondary | ICD-10-CM | POA: Diagnosis not present

## 2022-08-17 DIAGNOSIS — I422 Other hypertrophic cardiomyopathy: Secondary | ICD-10-CM

## 2022-08-17 DIAGNOSIS — E538 Deficiency of other specified B group vitamins: Secondary | ICD-10-CM

## 2022-08-17 DIAGNOSIS — Z0000 Encounter for general adult medical examination without abnormal findings: Secondary | ICD-10-CM

## 2022-08-17 DIAGNOSIS — N400 Enlarged prostate without lower urinary tract symptoms: Secondary | ICD-10-CM

## 2022-08-17 DIAGNOSIS — I25118 Atherosclerotic heart disease of native coronary artery with other forms of angina pectoris: Secondary | ICD-10-CM | POA: Diagnosis not present

## 2022-08-17 DIAGNOSIS — K279 Peptic ulcer, site unspecified, unspecified as acute or chronic, without hemorrhage or perforation: Secondary | ICD-10-CM

## 2022-08-17 DIAGNOSIS — E785 Hyperlipidemia, unspecified: Secondary | ICD-10-CM | POA: Diagnosis not present

## 2022-08-17 DIAGNOSIS — K2100 Gastro-esophageal reflux disease with esophagitis, without bleeding: Secondary | ICD-10-CM | POA: Diagnosis not present

## 2022-08-17 DIAGNOSIS — Z125 Encounter for screening for malignant neoplasm of prostate: Secondary | ICD-10-CM

## 2022-08-17 DIAGNOSIS — D509 Iron deficiency anemia, unspecified: Secondary | ICD-10-CM | POA: Diagnosis not present

## 2022-08-17 DIAGNOSIS — Z79899 Other long term (current) drug therapy: Secondary | ICD-10-CM | POA: Insufficient documentation

## 2022-08-17 MED ORDER — BOOSTRIX 5-2.5-18.5 LF-MCG/0.5 IM SUSY
0.5000 mL | PREFILLED_SYRINGE | Freq: Once | INTRAMUSCULAR | 0 refills | Status: AC
  Filled 2022-08-17: qty 0.5, 1d supply, fill #0

## 2022-08-17 NOTE — Assessment & Plan Note (Signed)
Sees urol/Dr Annabell Howells Psa is down  Takes flomax but also doxasosyn from cardiology

## 2022-08-17 NOTE — Assessment & Plan Note (Signed)
Feels dizzy when he strains or lifts  Not sob Able to walk  Ref back to cardiology

## 2022-08-17 NOTE — Assessment & Plan Note (Addendum)
Bp high today yet lightheaded at times   BP: (!) 150/72  ? Lower at home  ? If side effects  Current meds Carvedilol 6.25 mg bid  Amlodipine 5 mg daily Hydralazine 25 mg bid  Valsartan  320 mg daily  Doxazosin 4 mg daily   (of note also on flomax from his urologist)?  Ref him back to cardiology for light headedness and malaise Also for pharmacy consult for polypharmacy  He has poor understanding of meds at this point and feels overwhelmed

## 2022-08-17 NOTE — Assessment & Plan Note (Signed)
For heart/bp especially , also hx of PUD Feels overwhelmed Does not understand what meds are for what and why sometimes  Unsure how much this adds to his chronic light headedness and poor quality of life Ref to pharmacy team for some help

## 2022-08-17 NOTE — Assessment & Plan Note (Signed)
Stable Pl ct 131 No symptoms  May be due to chronic iron def

## 2022-08-17 NOTE — Assessment & Plan Note (Signed)
Ongoing  Describes as light headedness Not orthostatic / bp is actually high today  Is no longer bowling or active due to above  Cannot lift w/o getting dizzy  H/o cardiomyopathy Also PUD and GI bleed Also polypharamcy   Enc f/u with card, GI  Ref made to pharmacy

## 2022-08-17 NOTE — Assessment & Plan Note (Signed)
Lab Results  Component Value Date   PSA 1.66 08/10/2022   PSA 3.19 05/08/2018   PSA 2.71 03/26/2017    His BPH is unchanged-sees Dr Deno Etienne flomax

## 2022-08-17 NOTE — Assessment & Plan Note (Signed)
Continues protonix bid Also PUD with bleeding in past  Cbc stable Taking iron

## 2022-08-17 NOTE — Assessment & Plan Note (Signed)
No clinical changes Sees cards/fasc

## 2022-08-17 NOTE — Assessment & Plan Note (Signed)
No recent symptoms  Cbc and iron stable Sees GI Protonix bid

## 2022-08-17 NOTE — Patient Instructions (Addendum)
Go ahead and get a follow up with Dr Russella Dar GI  Tell them about the burping   You are due for a tetanus shot  You have to get that at pharmacy or health dept  You can schedule   I will place a cardiology referral for a follow up to discuss dizziness, inability to lift objects and ankle swelling along with your elevated blood pressure   I also want to refer you to a pharmacist to do a medicine review   You will get a call about both of these appointments  If you don 't hear in 2 weeks let us know    Labs look good  Keep walking if you feel able

## 2022-08-17 NOTE — Assessment & Plan Note (Signed)
Reviewed health habits including diet and exercise and skin cancer prevention Reviewed appropriate screening tests for age  Also reviewed health mt list, fam hx and immunization status , as well as social and family history   See HPI Labs reviewed and ordered Polypharmacy noted , fatigue and lightheadedness affect quality of life Due for td-will get at pharm or health dept Colonoscopy 2017 -no recall due ot age / sees GI for PUD Prostate health -psa down / bph symptoms stable

## 2022-08-17 NOTE — Assessment & Plan Note (Signed)
Oral supplementation Lab Results  Component Value Date   VITAMINB12 449 08/10/2022   Reassuring

## 2022-08-17 NOTE — Assessment & Plan Note (Signed)
Disc goals for lipids and reasons to control them Rev last labs with pt Rev low sat fat diet in detail Well controlled with atorvastatin  daily and diet    Last LDL of 40

## 2022-08-17 NOTE — Progress Notes (Signed)
Subjective:    Patient ID: Jerry Farrell, male    DOB: 11/07/1945, 77 y.o.   MRN: [id number]  HPI Here for health maintenance exam and to review chronic medical problems  Wt Readings from Last 3 Encounters:  08/17/22 183 lb 8 oz (83.2 kg)  06/27/22 182 lb (82.6 kg)  03/15/22 181 lb (82.1 kg)   28.53 kg/m  Vitals:   08/17/22 0757  BP: (!) 152/70  Pulse: 73  Temp: 97.9 F (36.6 C)  SpO2: 97%      Immunization History  Administered Date(s) Administered   COVID-19, mRNA, vaccine(Comirnaty)12 years and older 02/13/2022   Fluad Quad(high Dose 65+) 01/01/2019, 01/19/2020   Influenza Split 03/17/2011, 04/30/2012   Influenza, High Dose Seasonal PF 02/20/2018   Influenza,inj,Quad PF,6+ Mos 04/08/2013, 05/28/2015, 01/26/2016, 03/26/2017   Influenza-Unspecified 02/22/2014   PFIZER(Purple Top)SARS-COV-2 Vaccination 05/15/2019, 06/05/2019, 01/28/2020   Pfizer Covid-19 Vaccine Bivalent Booster 27yrs & up 02/08/2021   Pneumococcal Conjugate-13 10/28/2014   Pneumococcal Polysaccharide-23 04/30/2012   Td 08/22/1997, 11/26/2007   Zoster Recombinat (Shingrix) 10/12/2020   Zoster, Live 08/19/2013   Health Maintenance Due  Topic Date Due   DTaP/Tdap/Td (3 - Tdap) 11/25/2017   Zoster Vaccines- Shingrix (2 of 2) 12/07/2020   COLONOSCOPY (Pts 45-38yrs Insurance coverage will need to be confirmed)  02/14/2021   In general does not feel great  Still having dizziness -had to quit his bowling  Has been dizzy for years   Light headed (not dizzy)  Can happen with change in position to standing (not when turning his head) Lifting makes it worse- he has to sit down and rest   Tetanus shot - due   Shingrix-had both   Colonoscopy 01/2016 with 5 y recall-now no recall due to age   Prostate health H/o BPH Sees urology- dr Wilson Singer  - periodically follows up  Flomax  Lab Results  Component Value Date   PSA 1.66 08/10/2022   PSA 3.19 05/08/2018   PSA 2.71 03/26/2017    HTN  in  setting of CAD and cardiomyopathy  bp is stable today  No cp or palpitations or headaches or edema  No side effects to medicines  BP Readings from Last 3 Encounters:  08/17/22 (!) 152/70  03/15/22 136/80  01/04/22 110/62    Occ burning in chest when he walks  Had a cath   Carvedilol 6.25 mg bid  Amlodipine 5 mg daily Hydralazine 25 mg bid  Valsartan  320 mg daily  Doxazosin 4 mg daily   At home usually runs one teens/ 70 s    Last metabolic panel Lab Results  Component Value Date   GLUCOSE 99 08/10/2022   NA 140 08/10/2022   K 4.0 08/10/2022   CL 105 08/10/2022   CO2 27 08/10/2022   BUN 15 08/10/2022   CREATININE 1.02 08/10/2022   GFRNONAA >60 11/25/2021   CALCIUM 8.6 08/10/2022   PHOS 2.7 12/31/2009   PROT 6.5 08/10/2022   ALBUMIN 4.2 08/10/2022   BILITOT 1.1 08/10/2022   ALKPHOS 116 08/10/2022   AST 24 08/10/2022   ALT 32 08/10/2022   ANIONGAP 8 11/25/2021     GERD with h/o PUD Protonix bid  Some burping  No blood in stool   B12 def Lab Results  Component Value Date   VITAMINB12 449 08/10/2022  Oral supplementation    H/o thrombocytopenia and anemia  Lab Results  Component Value Date   WBC 7.7 08/10/2022   HGB 15.5 08/10/2022  HCT 44.6 08/10/2022   MCV 89.0 08/10/2022   PLT 131.0 (L) 08/10/2022   Stable On iron  Lab Results  Component Value Date   IRON 94 08/10/2022   TIBC 267 11/24/2021   FERRITIN 46.1 08/10/2022      Hyperlipidemia Lab Results  Component Value Date   CHOL 108 08/10/2022   CHOL 110 09/26/2021   CHOL 138 09/30/2020   Lab Results  Component Value Date   HDL 47.30 08/10/2022   HDL 58 09/26/2021   HDL 46.80 09/30/2020   Lab Results  Component Value Date   LDLCALC 40 08/10/2022   LDLCALC 37 09/26/2021   LDLCALC 65 09/30/2020   Lab Results  Component Value Date   TRIG 104.0 08/10/2022   TRIG 70 09/26/2021   TRIG 130.0 09/30/2020   Lab Results  Component Value Date   CHOLHDL 2 08/10/2022   CHOLHDL  1.9 09/26/2021   CHOLHDL 3 09/30/2020   Lab Results  Component Value Date   LDLDIRECT 80.0 05/08/2018   Atorvastatin 40 mg daily    Prediabetes Lab Results  Component Value Date   HGBA1C 5.6 08/10/2022   He eats healthy   Exercise is limited due to not feeling well  He can walk but cannot do a lot else     Patient Active Problem List   Diagnosis Date Noted   PAD (peripheral artery disease) 08/17/2022   Polypharmacy 08/17/2022   PVC's (premature ventricular contractions) 12/21/2021   Hypertrophic cardiomyopathy 11/28/2021   Acute prerenal azotemia 11/21/2021   GI bleed 11/21/2021   Right-sided thoracic back pain 09/20/2021   CAD in native artery 12/02/2020   Chest pain of uncertain etiology    Thrombocytopenia 10/07/2020   Pedal edema 03/28/2020   Gallstones 06/26/2019   Coronary artery disease 05/20/2019   Dizziness 08/26/2018   Peptic ulcer disease 06/14/2016   Iron deficiency anemia 06/14/2016   B12 deficiency 06/14/2016   Prediabetes 01/31/2016   Need for hepatitis C screening test 01/25/2016   GERD (gastroesophageal reflux disease) 07/20/2015   Routine general medical examination at a health care facility 10/28/2014   Elevated alkaline phosphatase level 10/28/2014   BPH (benign prostatic hyperplasia) 04/29/2014   Prostate cancer screening 08/11/2013   Encounter for Medicare annual wellness exam 08/11/2013   Dyslipidemia, goal LDL below 70 01/09/2007   ERECTILE DYSFUNCTION 01/09/2007   Essential hypertension 01/09/2007   S/P CABG (coronary artery bypass graft) 01/09/2007   ESOPHAGITIS 01/09/2007   Past Medical History:  Diagnosis Date   Adenomatous colon polyp    BPH (benign prostatic hyperplasia)    CAD (coronary artery disease)    cardiolite ok 3/05, stress test- low risk study 11/06   Chronic low back pain    with radiculopathy   ED (erectile dysfunction)    Gallstones    abd [place]- stable hamangioma, gallbladder sludge and tiny stones 09/2003    Gastritis    GERD (gastroesophageal reflux disease)    H pylori ulcer    Hyperlipidemia    Hypertension    Transfusion history    Past Surgical History:  Procedure Laterality Date   CATARACT EXTRACTION W/PHACO  03/13/2012   Procedure: CATARACT EXTRACTION PHACO AND INTRAOCULAR LENS PLACEMENT (IOC);  Surgeon: Shade Flood, MD;  Location: [facility] OR;  Service: Ophthalmology;  Laterality: Left;   CORONARY ARTERY BYPASS GRAFT  1998   x 1  done here at [facility]   ENTEROSCOPY N/A 11/22/2021   Procedure: ENTEROSCOPY;  Surgeon: Jenel Lucks, MD;  Location:  [facility] ENDOSCOPY;  Service: Gastroenterology;  Laterality: N/A;   ESOPHAGOGASTRODUODENOSCOPY (EGD) WITH PROPOFOL N/A 06/07/2016   Procedure: ESOPHAGOGASTRODUODENOSCOPY (EGD) WITH PROPOFOL;  Surgeon: Napoleon Form, MD;  Location: [facility] ENDOSCOPY;  Service: Endoscopy;  Laterality: N/A;   GASTRECTOMY     partial   HEMOSTASIS CLIP PLACEMENT  11/22/2021   Procedure: HEMOSTASIS CLIP PLACEMENT;  Surgeon: Jenel Lucks, MD;  Location: [facility];  Service: Gastroenterology;;   LEFT HEART CATH AND CORS/GRAFTS ANGIOGRAPHY N/A 12/02/2020   Procedure: LEFT HEART CATH AND CORS/GRAFTS ANGIOGRAPHY;  Surgeon: Runell Gess, MD;  Location: [facility];  Service: Cardiovascular;  Laterality: N/A;   MEMBRANE PEEL  03/13/2012   Procedure: MEMBRANE PEEL;  Surgeon: Shade Flood, MD;  Location: [facility] OR;  Service: Ophthalmology;  Laterality: Left;   PARS PLANA VITRECTOMY  03/13/2012   Procedure: PARS PLANA VITRECTOMY WITH 23 GAUGE;  Surgeon: Shade Flood, MD;  Location: [facility] OR;  Service: Ophthalmology;  Laterality: Left;   Social History   Tobacco Use   Smoking status: Never   Smokeless tobacco: Never  Vaping Use   Vaping Use: Never used  Substance Use Topics   Alcohol use: No    Alcohol/week: 0.0 standard drinks of alcohol   Drug use: No   Family History  Problem Relation Age of Onset   Hypertension Mother    Throat cancer Brother        pt feels  alcohol related   Colon cancer Brother        1/2 brother   Stomach cancer Neg Hx    Pancreatic cancer Neg Hx    No Known Allergies Current Outpatient Medications on File Prior to Visit  Medication Sig Dispense Refill   acetaminophen (TYLENOL) 500 MG tablet Take 1,000 mg by mouth every 6 (six) hours as needed for mild pain or headache.     aluminum hydroxide-magnesium carbonate (GAVISCON) 95-358 MG/15ML SUSP Take 15 mLs by mouth as needed for indigestion.     amLODipine (NORVASC) 5 MG tablet TAKE 1/2 OF A TABLET BY MOUTH 2 TIMES DAILY. 90 tablet 3   aspirin EC 81 MG tablet Take 1 tablet (81 mg total) by mouth daily. 90 tablet 3   atorvastatin (LIPITOR) 40 MG tablet TAKE 1 TABLET BY MOUTH EVERY DAY 90 tablet 3   carvedilol (COREG) 6.25 MG tablet TAKE 1 TABLET BY MOUTH 2 TIMES DAILY WITH A MEAL. 180 tablet 1   doxazosin (CARDURA) 4 MG tablet TAKE 1 TABLET BY MOUTH EVERY DAY 90 tablet 3   famotidine (PEPCID) 20 MG tablet TAKE 1 TABLET BY MOUTH TWICE A DAY 180 tablet 1   ferrous sulfate 325 (65 FE) MG EC tablet Take 1 tablet (325 mg total) by mouth daily with breakfast.     hydrALAZINE (APRESOLINE) 25 MG tablet TAKE 1 TABLET BY MOUTH TWICE A DAY 180 tablet 3   SYSTANE BALANCE 0.6 % SOLN Place 1 drop into both eyes 3 (three) times daily as needed (for dryness).     tamsulosin (FLOMAX) 0.4 MG CAPS capsule TAKE 1 CAPSULES BY MOUTH ONCE DAILY. 90 capsule 1   valsartan (DIOVAN) 320 MG tablet TAKE 1 TABLET BY MOUTH EVERY DAY 90 tablet 3   pantoprazole (PROTONIX) 40 MG tablet Take 1 tablet (40 mg total) by mouth 2 (two) times daily. 112 tablet 0   No current facility-administered medications on file prior to visit.     Review of Systems  Constitutional:  Positive for fatigue. Negative for activity  change, appetite change, fever and unexpected weight change.  HENT:  Negative for congestion, rhinorrhea, sore throat and trouble swallowing.   Eyes:  Negative for pain, redness, itching and visual  disturbance.  Respiratory:  Negative for cough, chest tightness, shortness of breath and wheezing.   Cardiovascular:  Positive for leg swelling. Negative for chest pain and palpitations.       Legs swell if he sits   Gastrointestinal:  Negative for abdominal pain, blood in stool, constipation, diarrhea and nausea.  Endocrine: Negative for cold intolerance, heat intolerance, polydipsia and polyuria.  Genitourinary:  Negative for difficulty urinating, dysuria, frequency and urgency.  Musculoskeletal:  Negative for arthralgias, joint swelling and myalgias.  Skin:  Negative for pallor and rash.  Neurological:  Positive for dizziness and light-headedness. Negative for tremors, seizures, syncope, speech difficulty, weakness, numbness and headaches.       Feels generally weak at times   Hematological:  Negative for adenopathy. Does not bruise/bleed easily.  Psychiatric/Behavioral:  Negative for decreased concentration and dysphoric mood. The patient is not nervous/anxious.        Frustrtated over physical problems/health conditions        Objective:   Physical Exam Constitutional:      General: He is not in acute distress.    Appearance: Normal appearance. He is well-developed and normal weight. He is not ill-appearing or diaphoretic.  HENT:     Head: Normocephalic and atraumatic.     Right Ear: Tympanic membrane, ear canal and external ear normal.     Left Ear: Tympanic membrane, ear canal and external ear normal.     Nose: Nose normal. No congestion.     Mouth/Throat:     Mouth: Mucous membranes are moist.     Pharynx: Oropharynx is clear. No posterior oropharyngeal erythema.  Eyes:     General: No scleral icterus.       Right eye: No discharge.        Left eye: No discharge.     Conjunctiva/sclera: Conjunctivae normal.     Pupils: Pupils are equal, round, and reactive to light.  Neck:     Thyroid: No thyromegaly.     Vascular: No carotid bruit or JVD.  Cardiovascular:     Rate and  Rhythm: Normal rate and regular rhythm.     Pulses: Normal pulses.     Heart sounds: Normal heart sounds.     No gallop.  Pulmonary:     Effort: Pulmonary effort is normal. No respiratory distress.     Breath sounds: Normal breath sounds. No wheezing or rales.     Comments: Good air exch Chest:     Chest wall: No tenderness.  Abdominal:     General: Bowel sounds are normal. There is no distension or abdominal bruit.     Palpations: Abdomen is soft. There is no mass.     Tenderness: There is no abdominal tenderness.     Hernia: No hernia is present.  Musculoskeletal:        General: No tenderness.     Cervical back: Normal range of motion and neck supple. No rigidity. No muscular tenderness.     Right lower leg: No edema.     Left lower leg: No edema.     Comments: No pedal edema today  Lymphadenopathy:     Cervical: No cervical adenopathy.  Skin:    General: Skin is warm and dry.     Coloration: Skin is not pale.  Findings: No erythema or rash.     Comments: Few skin tags and lenitgines   Neurological:     Mental Status: He is alert.     Cranial Nerves: No cranial nerve deficit.     Motor: No abnormal muscle tone.     Coordination: Coordination normal.     Gait: Gait normal.     Deep Tendon Reflexes: Reflexes are normal and symmetric. Reflexes normal.  Psychiatric:        Mood and Affect: Mood normal.        Cognition and Memory: Cognition normal.     Comments: Candidly discusses symptoms and stressors    Notes feeling overwhelmed with medications            Assessment & Plan:   Problem List Items Addressed This Visit       Cardiovascular and Mediastinum   Coronary artery disease    No clinical change but is dizzy with exertion Will plan f/u with cardiology Bp is up today      Relevant Orders   Ambulatory referral to Cardiology   Essential hypertension    Bp high today yet lightheaded at times   BP: (!) 150/72  ? Lower at home  ? If side effects   Current meds Carvedilol 6.25 mg bid  Amlodipine 5 mg daily Hydralazine 25 mg bid  Valsartan  320 mg daily  Doxazosin 4 mg daily   (of note also on flomax from his urologist)?  Ref him back to cardiology for light headedness and malaise Also for pharmacy consult for polypharmacy  He has poor understanding of meds at this point and feels overwhelmed       Relevant Orders   Ambulatory referral to Cardiology   Hypertrophic cardiomyopathy    Feels dizzy when he strains or lifts  Not sob Able to walk  Ref back to cardiology      PAD (peripheral artery disease)    No clinical changes Sees cards/fasc        Digestive   GERD (gastroesophageal reflux disease)    Continues protonix bid Also PUD with bleeding in past  Cbc stable Taking iron      Peptic ulcer disease    No recent symptoms  Cbc and iron stable Sees GI Protonix bid         Genitourinary   BPH (benign prostatic hyperplasia)    Sees urol/Dr Annabell Howells Psa is down  Takes flomax but also doxasosyn from cardiology        Hematopoietic and Hemostatic   Thrombocytopenia    Stable Pl ct 131 No symptoms  May be due to chronic iron def        Other   B12 deficiency    Oral supplementation Lab Results  Component Value Date   VITAMINB12 449 08/10/2022  Reassuring       Dizziness    Ongoing  Describes as light headedness Not orthostatic / bp is actually high today  Is no longer bowling or active due to above  Cannot lift w/o getting dizzy  H/o cardiomyopathy Also PUD and GI bleed Also polypharamcy   Enc f/u with card, GI  Ref made to pharmacy      Relevant Orders   Ambulatory referral to Cardiology   Dyslipidemia, goal LDL below 70    Disc goals for lipids and reasons to control them Rev last labs with pt Rev low sat fat diet in detail Well controlled with atorvastatin 40mg  daily and diet  Last LDL of 40       Iron deficiency anemia    Cbc stable  Mildly low platelet ct  On iron   H/o acute on chronic upper GI bleed  B12 level is in nl range       Polypharmacy    For heart/bp especially , also hx of PUD Feels overwhelmed Does not understand what meds are for what and why sometimes  Unsure how much this adds to his chronic light headedness and poor quality of life Ref to pharmacy team for some help        Prediabetes    Lab Results  Component Value Date   HGBA1C 5.6 08/10/2022  disc imp of low glycemic diet and wt loss to prevent DM2       Prostate cancer screening    Lab Results  Component Value Date   PSA 1.66 08/10/2022   PSA 3.19 05/08/2018   PSA 2.71 03/26/2017   His BPH is unchanged-sees Dr Deno Etienne flomax       Routine general medical examination at a health care facility - Primary    Reviewed health habits including diet and exercise and skin cancer prevention Reviewed appropriate screening tests for age  Also reviewed health mt list, fam hx and immunization status , as well as social and family history   See HPI Labs reviewed and ordered Polypharmacy noted , fatigue and lightheadedness affect quality of life Due for td-will get at pharm or health dept Colonoscopy 2017 -no recall due ot age / sees GI for PUD Prostate health -psa down / bph symptoms stable       Other Visit Diagnoses     Asymmetric septal hypertrophy   (Chronic)

## 2022-08-17 NOTE — Assessment & Plan Note (Signed)
Lab Results  Component Value Date   HGBA1C 5.6 08/10/2022   disc imp of low glycemic diet and wt loss to prevent DM2

## 2022-08-17 NOTE — Assessment & Plan Note (Signed)
No clinical change but is dizzy with exertion Will plan f/u with cardiology Bp is up today

## 2022-08-17 NOTE — Assessment & Plan Note (Signed)
Cbc stable  Mildly low platelet ct  On iron  H/o acute on chronic upper GI bleed  B12 level is in nl range

## 2022-08-21 ENCOUNTER — Ambulatory Visit: Payer: Medicare Other | Admitting: Pharmacist

## 2022-08-21 ENCOUNTER — Telehealth: Payer: Self-pay

## 2022-08-21 ENCOUNTER — Encounter: Payer: Self-pay | Admitting: Pharmacist

## 2022-08-21 NOTE — Patient Instructions (Signed)
Visit Information  Phone number for Pharmacist: [phone]  Thank you for meeting with me to discuss your medications! I look forward to working with you to achieve your health care goals. Below is a summary of what we talked about during the visit:  HOLD doxazosin until you see cardiology. Check your BP 2 hours after AM medications. Goal between 120-130/60-80  Avoid cough suppressants (DM - dextromethorphan)  You can use famotidine as needed for reflux/heartburn.   Al Corpus, PharmD, BCACP Clinical Pharmacist [facility] [phone]

## 2022-08-21 NOTE — Progress Notes (Signed)
Care Management & Coordination Services Pharmacy Note  08/21/2022 Name:  Jerry Farrell MRN:  [id number] DOB:  09/27/1945  Summary: Initial OV -HTN / orthostasis: pt reports BP range at home 117/63 - 131/73; he report he feels dizziness upon standing; reviewed medications for highest risk of orthostasis, generally alpha-blockers are high risk in people > 70; pt is on doxazosin and tamsulosin -Discussed OTC to avoid in s/o HTN/CAD: DM-products, NSAIDS  Recommendations/Changes made from today's visit: -Hold doxazosin until f/u with cardiology; keep daily log of BP  Follow up plan: -Health Concierge will call patient 1 week for BP update -Pharmacist follow up televisit scheduled for 1 month    Subjective: Jerry Farrell is an 77 y.o. year old male who is a primary patient of Tower, Audrie Gallus, MD.  The care coordination team was consulted for assistance with disease management and care coordination needs.    Engaged with patient face to face for initial visit.  Recent office visits: 08/17/22 Dr Milinda Antis OV: annual - dizziness with exertion. Refer back to cardiology.  Recent consult visits: 03/15/22 Dr Izora Ribas (Cardiology): CAD - CMR negative for HCM. no changes.  02/10/22 Dr Annabell Howells (Urology): BPH stable, f/u PRN.  01/04/22 PA Steffanie Dunn (GI): anemia - continue PPI BID until end of month then reduce to once daily. Avoid NSAIDs.   12/21/21 Dr Izora Ribas (Cardiology): plan CMR, if HCM confirmed would d/c norvasc and start diltiazem.   12/05/21 Dr Allyson Sabal (Cardiology): BP 140/60. Increase carvedilol to 6.25 mg BID  Hospital visits: 11/26/22 ED visit ([facility]): near syncope  11/20/21 - 11/26/22 Admission ([facility]): Postural dizziness, GI bleed. Continue PPI BID x 8 wks then daily, hold ASA x 2 weeks. Continue oral iron.   Objective:  Lab Results  Component Value Date   CREATININE 1.02 08/10/2022   BUN 15 08/10/2022   GFR 70.11 08/10/2022   EGFR 72 12/01/2020   GFRNONAA >60 11/25/2021   GFRAA 89  03/25/2019   NA 140 08/10/2022   K 4.0 08/10/2022   CALCIUM 8.6 08/10/2022   CO2 27 08/10/2022   GLUCOSE 99 08/10/2022    Lab Results  Component Value Date/Time   HGBA1C 5.6 08/10/2022 07:23 AM   HGBA1C 5.7 09/30/2020 07:38 AM   GFR 70.11 08/10/2022 07:23 AM   GFR 75.12 05/10/2021 02:36 PM    Last diabetic Eye exam: No results found for: "HMDIABEYEEXA"  Last diabetic Foot exam: No results found for: "HMDIABFOOTEX"   Lab Results  Component Value Date   CHOL 108 08/10/2022   HDL 47.30 08/10/2022   LDLCALC 40 08/10/2022   LDLDIRECT 80.0 05/08/2018   TRIG 104.0 08/10/2022   CHOLHDL 2 08/10/2022       Latest Ref Rng & Units 08/10/2022    7:23 AM 11/25/2021    7:41 AM 11/21/2021    4:18 AM  Hepatic Function  Total Protein 6.0 - 8.3 g/dL 6.5  6.0  4.9   Albumin 3.5 - 5.2 g/dL 4.2  3.7  2.8   AST 0 - 37 U/L 24  28  15    ALT 0 - 53 U/L 32  34  24   Alk Phosphatase 39 - 117 U/L 116  72  59   Total Bilirubin 0.2 - 1.2 mg/dL 1.1  1.1  1.1     Lab Results  Component Value Date/Time   TSH 0.62 08/10/2022 07:23 AM   TSH 0.62 09/30/2020 07:38 AM   FREET4 1.07 05/08/2018 09:20 AM   FREET4 0.97 05/01/2017 10:36  AM       Latest Ref Rng & Units 08/10/2022    7:23 AM 12/21/2021    8:43 AM 11/28/2021    3:36 PM  CBC  WBC 4.0 - 10.5 K/uL 7.7  6.1  6.8   Hemoglobin 13.0 - 17.0 g/dL 16.1  09.6  9.1   Hematocrit 39.0 - 52.0 % 44.6  36.4  26.9   Platelets 150.0 - 400.0 K/uL 131.0  161  205    Iron/TIBC/Ferritin/ %Sat    Component Value Date/Time   IRON 94 08/10/2022 0723   TIBC 267 11/24/2021 0740   FERRITIN 46.1 08/10/2022 0723   IRONPCTSAT 16 (L) 11/24/2021 0740    Lab Results  Component Value Date/Time   VITAMINB12 449 08/10/2022 07:23 AM   VITAMINB12 349 09/30/2020 07:38 AM    Clinical ASCVD: Yes  The ASCVD Risk score (Arnett DK, et al., 2019) failed to calculate for the following reasons:   The valid total cholesterol range is 130 to 320 mg/dL        0/07/5407     8:11 AM 11/28/2021    3:07 PM 06/24/2021    2:03 PM  Depression screen PHQ 2/9  Decreased Interest 0 0 0  Down, Depressed, Hopeless 0 0 0  PHQ - 2 Score 0 0 0     Social History   Tobacco Use  Smoking Status Never  Smokeless Tobacco Never   BP Readings from Last 3 Encounters:  08/21/22 117/63  08/17/22 (!) 150/72  03/15/22 136/80   Pulse Readings from Last 3 Encounters:  08/17/22 73  03/15/22 71  01/04/22 68   Wt Readings from Last 3 Encounters:  08/17/22 183 lb 8 oz (83.2 kg)  06/27/22 182 lb (82.6 kg)  03/15/22 181 lb (82.1 kg)   BMI Readings from Last 3 Encounters:  08/17/22 28.53 kg/m  06/27/22 28.51 kg/m  03/15/22 28.35 kg/m    No Known Allergies  Medications Reviewed Today     Reviewed by Kathyrn Sheriff, [facility] (Pharmacist) on 08/21/22 at [phone]  Med List Status: <None>   Medication Order Taking? Sig Documenting Provider Last Dose Status Informant  acetaminophen (TYLENOL) 500 MG tablet [id number] Yes Take 1,000 mg by mouth every 6 (six) hours as needed for mild pain or headache. [provider] Taking Active Self  aluminum hydroxide-magnesium carbonate (GAVISCON) 95-358 MG/15ML SUSP [id number] Yes Take 15 mLs by mouth as needed for indigestion. [provider] Taking Active Self  amLODipine (NORVASC) 5 MG tablet [id number] Yes TAKE 1/2 OF A TABLET BY MOUTH 2 TIMES DAILY. Runell Gess, MD Taking Active   aspirin EC 81 MG tablet [id number] Yes Take 1 tablet (81 mg total) by mouth daily. Lewie Chamber, MD Taking Active   atorvastatin (LIPITOR) 40 MG tablet [id number] Yes TAKE 1 TABLET BY MOUTH EVERY DAY Runell Gess, MD Taking Active   carvedilol (COREG) 6.25 MG tablet [id number] Yes TAKE 1 TABLET BY MOUTH 2 TIMES DAILY WITH A MEAL. Runell Gess, MD Taking Active   doxazosin (CARDURA) 4 MG tablet [id number] Yes TAKE 1 TABLET BY MOUTH EVERY DAY Runell Gess, MD Taking Active            Med Note Marco Collie Aug 21, 2022  9:47 AM) On HOLD starting 08/21/22  famotidine (PEPCID) 20 MG tablet [id number] Yes TAKE 1 TABLET BY MOUTH TWICE A DAY Tower, Audrie Gallus, MD Taking Active   ferrous sulfate 325 (65 FE) MG  EC tablet [id number] Yes Take 1 tablet (325 mg total) by mouth daily with breakfast. Lewie Chamber, MD Taking Active   hydrALAZINE (APRESOLINE) 25 MG tablet [id number] Yes TAKE 1 TABLET BY MOUTH TWICE A DAY Runell Gess, MD Taking Active   pantoprazole (PROTONIX) 40 MG tablet [id number]  Take 1 tablet (40 mg total) by mouth 2 (two) times daily.  Patient taking differently: Take 40 mg by mouth daily.   Lewie Chamber, MD  Expired 01/19/22 2359   SYSTANE BALANCE 0.6 % SOLN [id number] Yes Place 1 drop into both eyes 3 (three) times daily as needed (for dryness). [provider] Taking Active Self  tamsulosin (FLOMAX) 0.4 MG CAPS capsule [id number] Yes TAKE 1 CAPSULES BY MOUTH ONCE DAILY. Tower, Audrie Gallus, MD Taking Active   valsartan (DIOVAN) 320 MG tablet [id number] Yes TAKE 1 TABLET BY MOUTH EVERY DAY Runell Gess, MD Taking Active             SDOH:  (Social Determinants of Health) assessments and interventions performed: No SDOH Interventions    Flowsheet Row Clinical Support from 06/27/2022 in [facility] Clinical Support from 06/23/2020 in [facility] Clinical Support from 05/14/2019 in [facility] [place]  SDOH Interventions     Food Insecurity Interventions Intervention Not Indicated -- --  Housing Interventions Intervention Not Indicated -- --  Transportation Interventions Intervention Not Indicated -- --  Utilities Interventions Intervention Not Indicated -- --  Alcohol Usage Interventions Intervention Not Indicated (Score <7) -- --  Depression Interventions/Treatment  -- PHQ2-9 Score <4 Follow-up Not Indicated PHQ2-9 Score <4 Follow-up Not Indicated  Financial Strain Interventions Intervention  Not Indicated -- --  Physical Activity Interventions Patient Refused, Other (Comments) -- --  Stress Interventions Intervention Not Indicated -- --  Social Connections Interventions Intervention Not Indicated, Patient Refused -- --       Medication Assistance: None required.  Patient affirms current coverage meets needs.  Medication Access: Within the past 30 days, how often has patient missed a dose of medication? 0 Is a pillbox or other method used to improve adherence? Yes  Factors that may affect medication adherence? no barriers identified Are meds synced by current pharmacy? No  Are meds delivered by current pharmacy? No  Does patient experience delays in picking up medications due to transportation concerns? No   [facility] Services Reviewed: Is patient disadvantaged to use [facility]?: No  Current Rx insurance plan: [facility] Name and location of Current pharmacy:  [id number] [place], [place] - [address] AT [facility] [address] [place] [address] Phone: [phone] Fax: [phone]  [facility] services reviewed with patient today?: No  Patient requests to transfer care to [facility] Pharmacy?: No  Reason patient declined to change pharmacies: Not mentioned at this visit  Compliance/Adherence/Medication fill history: Care Gaps: None  Star-Rating Drugs: Atorvastatin - PDC 100% Valsartan - PDC 100%   Assessment/Plan   Hypertension (Goal: BP 120-130/60-80) -Not ideally controlled - BP at home generally at goal; suspect some degree of white coat syndrome -c/o dizziness with standing; also swelling in feet -Current home BP/HR readings: 117/63 - 131/72 -Last ejection fraction: 70-75% (Date: 10/2021) -HF type: grade 1 diastolic dysfunction; CMR negative for hypertrophic cardiomyopathy -Current treatment: Amlodipine 5 mg - 1/2 tab BID - Appropriate, Effective, Query Safe Carvedilol 6.25 mg BID - Appropriate, Effective,  Safe, Accessible Doxazosin 4 mg daily -  Appropriate, Effective, Query Safe Hydralazine 25 mg BID - Appropriate, Effective, Safe, Accessible Valsartan 320 mg daily - Appropriate, Effective, Safe, Accessible -Medications previously tried: n/a  -Educated on BP goals and benefits of medications for prevention of heart attack, stroke and kidney damage; Importance of home blood pressure monitoring; Symptoms of hypotension and importance of maintaining adequate hydration; -Reviewed medications for orthostasis potential; generally alpha blockers are high risk for orthostasis in age > 26; pt is also on both tamsulosin and doxazosin -Future directions: consider reduce/dc amlodipine and titrating carvedilol -Recommend to hold doxazosin until f/u with cardiology -Counseled to monitor BP at home 2 hrs after AM meds, document, and provide log at future appointments  Hyperlipidemia: (LDL goal < 70) -Controlled - LDL 40 (07/2022) at goal -Hx CAD s/p CABG; PAD -Current treatment: Atorvastatin 40 mg daily - Appropriate, Effective, Safe, Accessible Aspirin 81 mg daily - Appropriate, Effective, Safe, Accessible -Medications previously tried: n/a  -Educated on Cholesterol goals; Benefits of statin for ASCVD risk reduction; -Recommended to continue current medication  GERD (Goal: minimize symptoms of reflux ) -Controlled - pt reports belching, not sure how much reflux is affecting him -Hx of Bleeds/ulcers: Yes -Current treatment  Pantoprazole 40 mg daily - Appropriate, Effective, Safe, Accessible Famotidine 20 mg BID  - Query appropriate -Medications previously tried: none reported  -Discussed famotidine can be used PRN -Recommended to continue current medication  BPH (Goal: improve urination) -Controlled -Current treatment: Tamsulosin 0.4 mg daily - Appropriate, Effective, Safe, Accessible Doxazosin 4 mg daily- Query appropriate -Medications previously tried: n/a -Recommended to continue current  medication; hold doxazosin as above  Health Maintenance -OTC: ferrous sulfate 325 mg   Al Corpus, PharmD, [place], CPP Clinical Pharmacist Practitioner [facility] at [facility] [phone]

## 2022-08-21 NOTE — Progress Notes (Signed)
Care Management & Coordination Services Pharmacy Team  Reason for Encounter: Initial Appointment Reminder  Patient has an in office appointment with Al Corpus, PharmD on 08/21/2022 at 9:00.  Chart review:  Recent office visits:  08/16/22 Roxy Manns, MD Annual Exam Referral to Cardiology No med changes 06/27/22 AWV  Recent consult visits:  03/16/22 Riley Lam, MD (Cardiology) Coronary Artery Disease Stop: Pantoprazole 40 mg F/U 1 year  Hospital visits:  None in previous 6 months  Star Rating Drugs:  Medication:  Last Fill: Day Supply Atorvastatin 40 mg 07/11/2022 90 Valsartan 320 mg 06/30/2022 90  Care Gaps: Annual wellness visit in last year? Yes 06/27/2022  Al Corpus, PharmD notified  Claudina Lick, [place] Clinical Pharmacy Assistant [phone]

## 2022-08-30 ENCOUNTER — Telehealth: Payer: Self-pay

## 2022-08-30 NOTE — Telephone Encounter (Signed)
-----   Message from Kathyrn Sheriff, [facility] sent at 08/21/2022 10:03 AM EDT ----- Regarding: BP update Please call for BP update - doxazosin on hold

## 2022-08-30 NOTE — Progress Notes (Signed)
Called patient; no answer; left message.  Lindsey Foltanski, PharmD notified  Demetrie Borge, RMA Clinical Pharmacy Assistant [phone]   

## 2022-08-31 ENCOUNTER — Telehealth: Payer: Self-pay

## 2022-08-31 NOTE — Telephone Encounter (Signed)
-----   Message from Lindsey N Foltanski, RPH sent at 08/21/2022 10:03 AM EDT ----- Regarding: BP update Please call for BP update - doxazosin on hold  

## 2022-08-31 NOTE — Progress Notes (Signed)
Called patient for blood pressure check. No answer; left message.  Al Corpus, PharmD notified  Claudina Lick, [place] Clinical Pharmacy Assistant [phone]

## 2022-09-14 ENCOUNTER — Telehealth: Payer: Self-pay

## 2022-09-14 NOTE — Progress Notes (Signed)
Care Management & Coordination Services Pharmacy Team  Reason for Encounter: Appointment Reminder  Contacted patient to confirm telephone appointment with Jerry Farrell , PharmD on 09/19/22 at 3:00. Spoke with patient on 09/14/2022   Do you have any problems getting your medications? No  What is your top health concern you would like to discuss at your upcoming visit?   Patient is doing well with being off doxazosin. BP's have been  128/66, 131/63,119/64.  Patient increased walking activity and states he is feeling better.  Have you seen any other providers since your last visit with PCP? No  Hospital visits:  None in previous 6 months   Star Rating Drugs:  Medication:  Last Fill: Day Supply Atorvastatin 40mg  07/11/22 90 Valsartan 320mg  06/30/22  90   Care Gaps: Annual wellness visit in last year? Yes   Jerry Farrell, PharmD notified  Jerry Farrell, [facility] Clinical Pharmacy Assistant [phone]

## 2022-09-19 ENCOUNTER — Ambulatory Visit: Payer: Medicare Other | Admitting: Pharmacist

## 2022-09-19 NOTE — Progress Notes (Signed)
Care Management & Coordination Services Pharmacy Note  09/19/2022 Name:  Jerry Farrell MRN:  [id number] DOB:  Jun 14, 1945  Summary: F/U visit -HTN / orthostasis: pt reports BP range at home 119/64-144/71, he has been off of doxazosin for a month and reports dizziness is much improved  Recommendations/Changes made from today's visit: -Discontinue doxazosin from med list; keep appt with cardiology next week  Follow up plan: -Health Concierge will call patient 3 months for BP update -Pharmacist follow up televisit PRN -Cardiology 09/26/22    Subjective: Jerry Farrell is an 77 y.o. year old male who is a primary patient of Tower, Audrie Gallus, MD.  The care coordination team was consulted for assistance with disease management and care coordination needs.    Engaged with patient face to face for initial visit.  Recent office visits: 08/17/22 Dr Milinda Antis OV: annual - dizziness with exertion. Refer back to cardiology.  Recent consult visits: 03/15/22 Dr Izora Ribas (Cardiology): CAD - CMR negative for HCM. no changes.  02/10/22 Dr Annabell Howells (Urology): BPH stable, f/u PRN.  01/04/22 PA Steffanie Dunn (GI): anemia - continue PPI BID until end of month then reduce to once daily. Avoid NSAIDs.   12/21/21 Dr Izora Ribas (Cardiology): plan CMR, if HCM confirmed would d/c norvasc and start diltiazem.   12/05/21 Dr Allyson Sabal (Cardiology): BP 140/60. Increase carvedilol to 6.25 mg BID  Hospital visits: 11/26/22 ED visit ([facility]): near syncope  11/20/21 - 11/26/22 Admission ([facility]): Postural dizziness, GI bleed. Continue PPI BID x 8 wks then daily, hold ASA x 2 weeks. Continue oral iron.   Objective:  Lab Results  Component Value Date   CREATININE 1.02 08/10/2022   BUN 15 08/10/2022   GFR 70.11 08/10/2022   EGFR 72 12/01/2020   GFRNONAA >60 11/25/2021   GFRAA 89 03/25/2019   NA 140 08/10/2022   K 4.0 08/10/2022   CALCIUM 8.6 08/10/2022   CO2 27 08/10/2022   GLUCOSE 99 08/10/2022    Lab Results  Component  Value Date/Time   HGBA1C 5.6 08/10/2022 07:23 AM   HGBA1C 5.7 09/30/2020 07:38 AM   GFR 70.11 08/10/2022 07:23 AM   GFR 75.12 05/10/2021 02:36 PM    Last diabetic Eye exam: No results found for: "HMDIABEYEEXA"  Last diabetic Foot exam: No results found for: "HMDIABFOOTEX"   Lab Results  Component Value Date   CHOL 108 08/10/2022   HDL 47.30 08/10/2022   LDLCALC 40 08/10/2022   LDLDIRECT 80.0 05/08/2018   TRIG 104.0 08/10/2022   CHOLHDL 2 08/10/2022       Latest Ref Rng & Units 08/10/2022    7:23 AM 11/25/2021    7:41 AM 11/21/2021    4:18 AM  Hepatic Function  Total Protein 6.0 - 8.3 g/dL 6.5  6.0  4.9   Albumin 3.5 - 5.2 g/dL 4.2  3.7  2.8   AST 0 - 37 U/L 24  28  15    ALT 0 - 53 U/L 32  34  24   Alk Phosphatase 39 - 117 U/L 116  72  59   Total Bilirubin 0.2 - 1.2 mg/dL 1.1  1.1  1.1     Lab Results  Component Value Date/Time   TSH 0.62 08/10/2022 07:23 AM   TSH 0.62 09/30/2020 07:38 AM   FREET4 1.07 05/08/2018 09:20 AM   FREET4 0.97 05/01/2017 10:36 AM       Latest Ref Rng & Units 08/10/2022    7:23 AM 12/21/2021    8:43 AM 11/28/2021  3:36 PM  CBC  WBC 4.0 - 10.5 K/uL 7.7  6.1  6.8   Hemoglobin 13.0 - 17.0 g/dL 16.1  09.6  9.1   Hematocrit 39.0 - 52.0 % 44.6  36.4  26.9   Platelets 150.0 - 400.0 K/uL 131.0  161  205    Iron/TIBC/Ferritin/ %Sat    Component Value Date/Time   IRON 94 08/10/2022 0723   TIBC 267 11/24/2021 0740   FERRITIN 46.1 08/10/2022 0723   IRONPCTSAT 16 (L) 11/24/2021 0740    Lab Results  Component Value Date/Time   VITAMINB12 449 08/10/2022 07:23 AM   VITAMINB12 349 09/30/2020 07:38 AM    Clinical ASCVD: Yes  The ASCVD Risk score (Arnett DK, et al., 2019) failed to calculate for the following reasons:   The valid total cholesterol range is 130 to 320 mg/dL        0/07/5407    8:11 AM 11/28/2021    3:07 PM 06/24/2021    2:03 PM  Depression screen PHQ 2/9  Decreased Interest 0 0 0  Down, Depressed, Hopeless 0 0 0  PHQ - 2 Score  0 0 0     Social History   Tobacco Use  Smoking Status Never  Smokeless Tobacco Never   BP Readings from Last 3 Encounters:  08/21/22 117/63  08/17/22 (!) 150/72  03/15/22 136/80   Pulse Readings from Last 3 Encounters:  08/17/22 73  03/15/22 71  01/04/22 68   Wt Readings from Last 3 Encounters:  08/17/22 183 lb 8 oz (83.2 kg)  06/27/22 182 lb (82.6 kg)  03/15/22 181 lb (82.1 kg)   BMI Readings from Last 3 Encounters:  08/17/22 28.53 kg/m  06/27/22 28.51 kg/m  03/15/22 28.35 kg/m    No Known Allergies  Medications Reviewed Today     Reviewed by Kathyrn Sheriff, [facility] (Pharmacist) on 09/19/22 at 1506  Med List Status: <None>   Medication Order Taking? Sig Documenting Provider Last Dose Status Informant  acetaminophen (TYLENOL) 500 MG tablet [id number] Yes Take 1,000 mg by mouth every 6 (six) hours as needed for mild pain or headache. [provider] Taking Active Self  aluminum hydroxide-magnesium carbonate (GAVISCON) 95-358 MG/15ML SUSP [id number] Yes Take 15 mLs by mouth as needed for indigestion. [provider] Taking Active Self  amLODipine (NORVASC) 5 MG tablet [id number] Yes TAKE 1/2 OF A TABLET BY MOUTH 2 TIMES DAILY. Runell Gess, MD Taking Active   aspirin EC 81 MG tablet [id number] Yes Take 1 tablet (81 mg total) by mouth daily. Lewie Chamber, MD Taking Active   atorvastatin (LIPITOR) 40 MG tablet [id number] Yes TAKE 1 TABLET BY MOUTH EVERY DAY Runell Gess, MD Taking Active   carvedilol (COREG) 6.25 MG tablet [id number] Yes TAKE 1 TABLET BY MOUTH 2 TIMES DAILY WITH A MEAL. Runell Gess, MD Taking Active   Discontinued 09/19/22 1506 (Completed Course)            Med Note Marco Collie Aug 21, 2022  9:47 AM) On HOLD starting 08/21/22  famotidine (PEPCID) 20 MG tablet [id number] Yes TAKE 1 TABLET BY MOUTH TWICE A DAY Tower, Audrie Gallus, MD Taking Active   ferrous sulfate 325 (65 FE) MG EC tablet [id number] Yes Take 1  tablet (325 mg total) by mouth daily with breakfast. Lewie Chamber, MD Taking Active   hydrALAZINE (APRESOLINE) 25 MG tablet [id number] Yes TAKE 1 TABLET BY MOUTH TWICE A DAY Runell Gess, MD Taking Active  pantoprazole (PROTONIX) 40 MG tablet [id number]  Take 1 tablet (40 mg total) by mouth 2 (two) times daily.  Patient taking differently: Take 40 mg by mouth daily.   Lewie Chamber, MD  Expired 01/19/22 2359   SYSTANE BALANCE 0.6 % SOLN [id number] Yes Place 1 drop into both eyes 3 (three) times daily as needed (for dryness). [provider] Taking Active Self  tamsulosin (FLOMAX) 0.4 MG CAPS capsule [id number] Yes TAKE 1 CAPSULES BY MOUTH ONCE DAILY. Tower, Audrie Gallus, MD Taking Active   valsartan (DIOVAN) 320 MG tablet [id number] Yes TAKE 1 TABLET BY MOUTH EVERY DAY Runell Gess, MD Taking Active             SDOH:  (Social Determinants of Health) assessments and interventions performed: No SDOH Interventions    Flowsheet Row Clinical Support from 06/27/2022 in [facility] Clinical Support from 06/23/2020 in [facility] Clinical Support from 05/14/2019 in [facility] [place]  SDOH Interventions     Food Insecurity Interventions Intervention Not Indicated -- --  Housing Interventions Intervention Not Indicated -- --  Transportation Interventions Intervention Not Indicated -- --  Utilities Interventions Intervention Not Indicated -- --  Alcohol Usage Interventions Intervention Not Indicated (Score <7) -- --  Depression Interventions/Treatment  -- PHQ2-9 Score <4 Follow-up Not Indicated PHQ2-9 Score <4 Follow-up Not Indicated  Financial Strain Interventions Intervention Not Indicated -- --  Physical Activity Interventions Patient Refused, Other (Comments) -- --  Stress Interventions Intervention Not Indicated -- --  Social Connections Interventions Intervention Not Indicated, Patient  Refused -- --       Medication Assistance: None required.  Patient affirms current coverage meets needs.  Medication Access: Within the past 30 days, how often has patient missed a dose of medication? 0 Is a pillbox or other method used to improve adherence? Yes  Factors that may affect medication adherence? no barriers identified Are meds synced by current pharmacy? No  Are meds delivered by current pharmacy? No  Does patient experience delays in picking up medications due to transportation concerns? No   Upstream Services Reviewed: Is patient disadvantaged to use [facility]?: No  Current Rx insurance plan: [facility] Name and location of Current pharmacy:  [id number] [place], [place] - [address] AT [address] [place] [address] Phone: [phone] Fax: [phone]  [facility] Pharm[facility] services reviewed with patient today?: No  Patient requests to transfer care to [facility] Pharmacy?: No  Reason patient declined to change pharmacies: Not mentioned at this visit  Compliance/Adherence/Medication fill history: Care Gaps: None  Star-Rating Drugs: Atorvastatin - PDC 100% Valsartan - PDC 100%    Assessment/Plan  Hypertension (Goal: BP 120-140/60-80) -Controlled - BP at home generally at goal; he has held doxazosin since 08/21/22 and reports dizziness with standing is much improved -Current home BP/HR readings: 126/71, 140/76, 135/73, 138/74, 144/71, 135/76, 129/70 -Last ejection fraction: 70-75% (Date: 10/2021) -HF type: grade 1 diastolic dysfunction; CMR negative for hypertrophic cardiomyopathy -Current treatment: Amlodipine 5 mg - 1/2 tab BID - Appropriate, Effective, Query Safe Carvedilol 6.25 mg BID - Appropriate, Effective, Safe, Accessible Doxazosin 4 mg daily -held since 08/21/22 Hydralazine 25 mg BID - Appropriate, Effective, Safe, Accessible Valsartan 320 mg daily - Appropriate, Effective, Safe,  Accessible -Medications previously tried: n/a  -Educated on BP goals and benefits of medications for prevention of heart attack, stroke and kidney damage; Importance  of home blood pressure monitoring; Symptoms of hypotension and importance of maintaining adequate hydration; -Reviewed medications for orthostasis potential; generally alpha blockers are high risk for orthostasis in age > 31; pt is also on both tamsulosin and doxazosin -Future directions: consider reduce/dc amlodipine and titrating carvedilol -Discontinued doxazosin from med list; advised to continue monitoring BP  Hyperlipidemia: (LDL goal < 70) -Controlled - LDL 40 (07/2022) at goal -Hx CAD s/p CABG; PAD -Current treatment: Atorvastatin 40 mg daily - Appropriate, Effective, Safe, Accessible Aspirin 81 mg daily - Appropriate, Effective, Safe, Accessible -Medications previously tried: n/a  -Educated on Cholesterol goals; Benefits of statin for ASCVD risk reduction; -Recommended to continue current medication  GERD (Goal: minimize symptoms of reflux ) -Controlled - pt reports belching, not sure how much reflux is affecting him -Hx of Bleeds/ulcers: Yes -Current treatment  Pantoprazole 40 mg daily - Appropriate, Effective, Safe, Accessible Famotidine 20 mg BID  - Query appropriate -Medications previously tried: none reported  -Discussed famotidine can be used PRN -Recommended to continue current medication  BPH (Goal: improve urination) -Controlled -Current treatment: Tamsulosin 0.4 mg daily - Appropriate, Effective, Safe, Accessible -Medications previously tried: doxazosin -Recommended to continue current medication; hold doxazosin as above  Health Maintenance -OTC: ferrous sulfate 325 mg   Al Corpus, PharmD, [place], CPP Clinical Pharmacist Practitioner [facility] [phone]

## 2022-09-21 DIAGNOSIS — H524 Presbyopia: Secondary | ICD-10-CM | POA: Diagnosis not present

## 2022-09-21 DIAGNOSIS — H40051 Ocular hypertension, right eye: Secondary | ICD-10-CM | POA: Diagnosis not present

## 2022-09-21 DIAGNOSIS — H52223 Regular astigmatism, bilateral: Secondary | ICD-10-CM | POA: Diagnosis not present

## 2022-09-21 DIAGNOSIS — H5202 Hypermetropia, left eye: Secondary | ICD-10-CM | POA: Diagnosis not present

## 2022-09-21 DIAGNOSIS — H26493 Other secondary cataract, bilateral: Secondary | ICD-10-CM | POA: Diagnosis not present

## 2022-09-26 ENCOUNTER — Ambulatory Visit: Payer: Medicare Other | Attending: Cardiovascular Disease | Admitting: Cardiovascular Disease

## 2022-09-26 ENCOUNTER — Encounter: Payer: Self-pay | Admitting: Cardiovascular Disease

## 2022-09-26 VITALS — BP 142/78 | HR 64 | Ht 67.0 in | Wt 184.2 lb

## 2022-09-26 DIAGNOSIS — I422 Other hypertrophic cardiomyopathy: Secondary | ICD-10-CM

## 2022-09-26 DIAGNOSIS — E785 Hyperlipidemia, unspecified: Secondary | ICD-10-CM | POA: Diagnosis not present

## 2022-09-26 DIAGNOSIS — Z951 Presence of aortocoronary bypass graft: Secondary | ICD-10-CM

## 2022-09-26 DIAGNOSIS — I10 Essential (primary) hypertension: Secondary | ICD-10-CM

## 2022-09-26 DIAGNOSIS — I493 Ventricular premature depolarization: Secondary | ICD-10-CM | POA: Diagnosis not present

## 2022-09-26 DIAGNOSIS — I25118 Atherosclerotic heart disease of native coronary artery with other forms of angina pectoris: Secondary | ICD-10-CM

## 2022-09-26 NOTE — Assessment & Plan Note (Signed)
Event monitor performed 10/24/2021 revealed a 5.3% PVC burden of the patient really denies palpitations.  He did have short runs of SVT as well.

## 2022-09-26 NOTE — Assessment & Plan Note (Signed)
History of dyslipidemia on statin therapy with lipid profile performed 08/10/2022 revealing total cholesterol 108, LDL 40 and HDL 47.

## 2022-09-26 NOTE — Progress Notes (Signed)
09/26/2022 Jerry Farrell   [birth year]  [id number]  Primary Physician Tower, Audrie Gallus, MD Primary Cardiologist: Runell Gess MD Jerry Farrell, [place]  HPI:  Jerry Farrell is a 77 y.o.  thin-appearing married African-American male father of 3, grandfather and 5 grandchildren who is referred back to me by Dr. Milinda Antis, his PCP, to be reestablished in my practice. I last saw him in the office 12/05/2021.  His risk factors include treated hypertension and hyperlipidemia. There is is a family history of a brother who recently had bypass surgery. He has never smoked. Retired from [employer]. He had coronary artery bypass grafting X 2 in 1994 .he had a LIMA to his LAD and a vein to the RCA.  He denies chest pain or shortness of breath. He did see how main in the office 06/02/16 after having had a Myoview stress test 04/11/16 that was essentially normal with no evidence of ischemia.    He had developed exertional chest tightness and dyspnea as well as fatigue.  He did not have the symptoms when I saw him back in February of 2022.  Based on this, I decided to proceed with elective outpatient diagnostic coronary angiography on 12/02/2020 via the femoral approach.  I demonstrated normal LV systolic function, and occluded proximal LAD with a patent LIMA to the LAD, left to left collaterals from the circumflex to an occluded diagonal branch, and an occluded native RCA and RCA vein graft.  Since his procedure his symptoms of chest pain and dyspnea have markedly improved.   Since I saw him a year ago he continues to do well.  He walks 3 hours a day at a 3.1 mile-per-hour pace.  He denies chest pain or shortness of breath.  I did refer him to Dr.Chandrasekhar for evaluation of asymmetric septal hypertrophy.  He does have a 1.9 cm septum with a 16mm intracavitary gradient but he did not think this was significant for hypertrophic obstructive cardiomyopathy.   Current Meds  Medication Sig   acetaminophen (TYLENOL)  500 MG tablet Take 1,000 mg by mouth every 6 (six) hours as needed for mild pain or headache.   aluminum hydroxide-magnesium carbonate (GAVISCON) 95-358 MG/15ML SUSP Take 15 mLs by mouth as needed for indigestion.   amLODipine (NORVASC) 5 MG tablet TAKE 1/2 OF A TABLET BY MOUTH 2 TIMES DAILY.   aspirin EC 81 MG tablet Take 1 tablet (81 mg total) by mouth daily.   atorvastatin (LIPITOR) 40 MG tablet TAKE 1 TABLET BY MOUTH EVERY DAY   carvedilol (COREG) 6.25 MG tablet TAKE 1 TABLET BY MOUTH 2 TIMES DAILY WITH A MEAL.   ferrous sulfate 325 (65 FE) MG EC tablet Take 1 tablet (325 mg total) by mouth daily with breakfast.   hydrALAZINE (APRESOLINE) 25 MG tablet TAKE 1 TABLET BY MOUTH TWICE A DAY   SYSTANE BALANCE 0.6 % SOLN Place 1 drop into both eyes 3 (three) times daily as needed (for dryness).   tamsulosin (FLOMAX) 0.4 MG CAPS capsule TAKE 1 CAPSULES BY MOUTH ONCE DAILY.   valsartan (DIOVAN) 320 MG tablet TAKE 1 TABLET BY MOUTH EVERY DAY     No Known Allergies  Social History   Socioeconomic History   Marital status: Married    Spouse name: Not on file   Number of children: 3   Years of education: Not on file   Highest education level: Not on file  Occupational History   Occupation: [employer]  Employer: RETIRED  Tobacco Use   Smoking status: Never   Smokeless tobacco: Never  Vaping Use   Vaping Use: Never used  Substance and Sexual Activity   Alcohol use: No    Alcohol/week: 0.0 standard drinks of alcohol   Drug use: No   Sexual activity: Yes  Other Topics Concern   Not on file  Social History Narrative   Not on file   Social Determinants of Health   Financial Resource Strain: Low Risk  (06/27/2022)   Overall Financial Resource Strain (CARDIA)    Difficulty of Paying Living Expenses: Not hard at all  Food Insecurity: No Food Insecurity (06/27/2022)   Hunger Vital Sign    Worried About Running Out of Food in the Last Year: Never true    Ran Out of Food in the Last Year:  Never true  Transportation Needs: No Transportation Needs (06/27/2022)   PRAPARE - Administrator, Civil Service (Medical): No    Lack of Transportation (Non-Medical): No  Physical Activity: Inactive (06/27/2022)   Exercise Vital Sign    Days of Exercise per Week: 0 days    Minutes of Exercise per Session: 0 min  Stress: No Stress Concern Present (06/27/2022)   [employer] of Occupational Health - Occupational Stress Questionnaire    Feeling of Stress : Not at all  Social Connections: Moderately Integrated (06/27/2022)   Social Connection and Isolation Panel [NHANES]    Frequency of Communication with Friends and Family: More than three times a week    Frequency of Social Gatherings with Friends and Family: Three times a week    Attends Religious Services: More than 4 times per year    Active Member of Clubs or Organizations: No    Attends Banker Meetings: Never    Marital Status: Married  Catering manager Violence: Not At Risk (06/27/2022)   Humiliation, Afraid, Rape, and Kick questionnaire    Fear of Current or Ex-Partner: No    Emotionally Abused: No    Physically Abused: No    Sexually Abused: No     Review of Systems: General: negative for chills, fever, night sweats or weight changes.  Cardiovascular: negative for chest pain, dyspnea on exertion, edema, orthopnea, palpitations, paroxysmal nocturnal dyspnea or shortness of breath Dermatological: negative for rash Respiratory: negative for cough or wheezing Urologic: negative for hematuria Abdominal: negative for nausea, vomiting, diarrhea, bright red blood per rectum, melena, or hematemesis Neurologic: negative for visual changes, syncope, or dizziness All other systems reviewed and are otherwise negative except as noted above.    Blood pressure (!) 142/78, pulse 64, height 5\' 7"  (1.702 m), weight 184 lb 3.2 oz (83.6 kg), SpO2 98 %.  General appearance: alert and no distress Neck: no adenopathy, no  JVD, supple, symmetrical, trachea midline, thyroid not enlarged, symmetric, no tenderness/mass/nodules, and right carotid bruit Lungs: clear to auscultation bilaterally Heart: regular rate and rhythm, S1, S2 normal, no murmur, click, rub or gallop Extremities: extremities normal, atraumatic, no cyanosis or edema Pulses: 2+ and symmetric Skin: Skin color, texture, turgor normal. No rashes or lesions Neurologic: Grossly normal  EKG sinus rhythm at 64 with nonspecific ST and T wave changes.  Personally reviewed this EKG.  ASSESSMENT AND PLAN:   Dyslipidemia, goal LDL below 70 History of dyslipidemia on statin therapy with lipid profile performed 08/10/2022 revealing total cholesterol 108, LDL 40 and HDL 47.  Essential hypertension History of essential hypertension a blood pressure measured today at 142/78.  He is on amlodipine, carvedilol, valsartan and hydralazine.  S/P CABG (coronary artery bypass graft) History of CAD status post coronary artery bypass grafting x 2 in 1994.  Because of chest tightness I performed cardiac catheterization on him 12/02/2020 revealing an occluded RCA with an occluded vein graft to the RCA, occluded proximal LAD with a patent LIMA to the LAD and 50% segmental proximal ramus branch stenosis with normal LV function.  He denies chest pain.  Hypertrophic cardiomyopathy [facility]) I referred Jerry Farrell to Dr.Chandrasehkar for evaluation of hypertrophic cardiomyopathy.  He does have asymmetric septal hypertrophy with a intracavitary gradient of 16 mmHg without significant obstruction.  His MRI was unremarkable.  This did not appear to be a significant finding.  PVC's (premature ventricular contractions) Event monitor performed 10/24/2021 revealed a 5.3% PVC burden of the patient really denies palpitations.  He did have short runs of SVT as well.     Runell Gess MD FACP,FACC,FAHA, [facility] 09/26/2022 9:10 AM

## 2022-09-26 NOTE — Assessment & Plan Note (Signed)
History of CAD status post coronary artery bypass grafting x 2 in 1994.  Because of chest tightness I performed cardiac catheterization on him 12/02/2020 revealing an occluded RCA with an occluded vein graft to the RCA, occluded proximal LAD with a patent LIMA to the LAD and 50% segmental proximal ramus branch stenosis with normal LV function.  He denies chest pain.

## 2022-09-26 NOTE — Assessment & Plan Note (Signed)
History of essential hypertension a blood pressure measured today at 142/78.  He is on amlodipine, carvedilol, valsartan and hydralazine.

## 2022-09-26 NOTE — Patient Instructions (Signed)
    Follow-Up: At [facility], you and your health needs are our priority.  As part of our continuing mission to provide you with exceptional heart care, we have created designated Provider Care Teams.  These Care Teams include your primary Cardiologist (physician) and Advanced Practice Providers (APPs -  Physician Assistants and Nurse Practitioners) who all work together to provide you with the care you need, when you need it.  We recommend signing up for the patient portal called "MyChart".  Sign up information is provided on this After Visit Summary.  MyChart is used to connect with patients for Virtual Visits (Telemedicine).  Patients are able to view lab/test results, encounter notes, upcoming appointments, etc.  Non-urgent messages can be sent to your provider as well.   To learn more about what you can do with MyChart, go to [email or web address].    Your next appointment:   12 month(s)  Provider:   Jonathan Berry, MD      

## 2022-09-26 NOTE — Assessment & Plan Note (Addendum)
I referred Jerry Farrell to Dr.Chandrasehkar for evaluation of hypertrophic cardiomyopathy.  He does have asymmetric septal hypertrophy with a intracavitary gradient of 16 mmHg without significant obstruction.  His MRI was unremarkable.  This did not appear to be a significant finding.

## 2022-10-20 ENCOUNTER — Other Ambulatory Visit: Payer: Self-pay | Admitting: Family Medicine

## 2022-11-17 ENCOUNTER — Encounter: Payer: Self-pay | Admitting: Adult Health

## 2022-11-17 ENCOUNTER — Ambulatory Visit: Payer: Medicare Other | Admitting: Adult Health

## 2022-11-17 VITALS — BP 150/70 | HR 65 | Temp 98.2°F | Ht 67.0 in | Wt 185.0 lb

## 2022-11-17 DIAGNOSIS — I10 Essential (primary) hypertension: Secondary | ICD-10-CM

## 2022-11-17 DIAGNOSIS — R42 Dizziness and giddiness: Secondary | ICD-10-CM

## 2022-11-17 MED ORDER — MECLIZINE HCL 25 MG PO TABS: 25.0000 mg | ORAL_TABLET | Freq: Three times a day (TID) | ORAL | 0 refills | Status: AC | PRN

## 2022-11-17 NOTE — Patient Instructions (Signed)
I think you are suffering from Vertigo - this is an inner ear disorder. I have sent in a medication called Meclizine to help. This medication can make you feel drowsy   Please follow up with your PCP if not improving in the next week

## 2022-11-17 NOTE — Progress Notes (Signed)
Subjective:    Patient ID: Jerry Farrell, male    DOB: 1945/09/19, 77 y.o.   MRN: [id number]  HPI 77 year old male who  has a past medical history of Adenomatous colon polyp, BPH (benign prostatic hyperplasia), CAD (coronary artery disease), Chronic low back pain, ED (erectile dysfunction), Gallstones, Gastritis, GERD (gastroesophageal reflux disease), H pylori ulcer, Hyperlipidemia, Hypertension, and Transfusion history.  He is a patient of Dr. Milinda Antis who I am seeing today for dizziness and headache. Dizziness has  been present for longer than a week. Headache has been present for about a month.  He has had symptoms of this in the past but feels as though it has become worse. He feels as though he may pass out at time. Last week he was walking and had walked about 3 miles. He got back to his vehicle and looked at his watch to see how far he walked and became " really dizzy and felt like I had to sit down". When he got home his blood pressure was in the low 100s systolic ( this has been the lowest BP reading he has seen). He drank water and symptoms improved. Most of his BP readings have been in the 120-140 systolic.    He seems to have to have his symptoms daily, unsure of how long the symptoms last. Can happen has walking, eating, or changing positions.   He has not had any falls, chest pain or shortness of breath.   He has been dealing with suspected seasonal allergies.   Review of Systems See HPI   Past Medical History:  Diagnosis Date   Adenomatous colon polyp    BPH (benign prostatic hyperplasia)    CAD (coronary artery disease)    cardiolite ok 3/05, stress test- low risk study 11/06   Chronic low back pain    with radiculopathy   ED (erectile dysfunction)    Gallstones    abd [place]- stable hamangioma, gallbladder sludge and tiny stones 09/2003   Gastritis    GERD (gastroesophageal reflux disease)    H pylori ulcer    Hyperlipidemia    Hypertension    Transfusion history      Social History   Socioeconomic History   Marital status: Married    Spouse name: Not on file   Number of children: 3   Years of education: Not on file   Highest education level: Not on file  Occupational History   Occupation: [employer]    Employer: RETIRED  Tobacco Use   Smoking status: Never   Smokeless tobacco: Never  Vaping Use   Vaping status: Never Used  Substance and Sexual Activity   Alcohol use: No    Alcohol/week: 0.0 standard drinks of alcohol   Drug use: No   Sexual activity: Yes  Other Topics Concern   Not on file  Social History Narrative   Not on file   Social Determinants of Health   Financial Resource Strain: Low Risk  (06/27/2022)   Overall Financial Resource Strain (CARDIA)    Difficulty of Paying Living Expenses: Not hard at all  Food Insecurity: No Food Insecurity (06/27/2022)   Hunger Vital Sign    Worried About Running Out of Food in the Last Year: Never true    Ran Out of Food in the Last Year: Never true  Transportation Needs: No Transportation Needs (06/27/2022)   PRAPARE - Administrator, Civil Service (Medical): No    Lack of Transportation (Non-Medical):  No  Physical Activity: Inactive (06/27/2022)   Exercise Vital Sign    Days of Exercise per Week: 0 days    Minutes of Exercise per Session: 0 min  Stress: No Stress Concern Present (06/27/2022)   [employer] of Occupational Health - Occupational Stress Questionnaire    Feeling of Stress : Not at all  Social Connections: Moderately Integrated (06/27/2022)   Social Connection and Isolation Panel [NHANES]    Frequency of Communication with Friends and Family: More than three times a week    Frequency of Social Gatherings with Friends and Family: Three times a week    Attends Religious Services: More than 4 times per year    Active Member of Clubs or Organizations: No    Attends Banker Meetings: Never    Marital Status: Married  Catering manager Violence: Not At  Risk (06/27/2022)   Humiliation, Afraid, Rape, and Kick questionnaire    Fear of Current or Ex-Partner: No    Emotionally Abused: No    Physically Abused: No    Sexually Abused: No    Past Surgical History:  Procedure Laterality Date   CATARACT EXTRACTION W/PHACO  03/13/2012   Procedure: CATARACT EXTRACTION PHACO AND INTRAOCULAR LENS PLACEMENT (IOC);  Surgeon: Shade Flood, MD;  Location: [facility] OR;  Service: Ophthalmology;  Laterality: Left;   CORONARY ARTERY BYPASS GRAFT  1998   x 1  done here at [facility]   ENTEROSCOPY N/A 11/22/2021   Procedure: ENTEROSCOPY;  Surgeon: Jenel Lucks, MD;  Location: [facility];  Service: Gastroenterology;  Laterality: N/A;   ESOPHAGOGASTRODUODENOSCOPY (EGD) WITH PROPOFOL N/A 06/07/2016   Procedure: ESOPHAGOGASTRODUODENOSCOPY (EGD) WITH PROPOFOL;  Surgeon: Napoleon Form, MD;  Location: [facility];  Service: Endoscopy;  Laterality: N/A;   GASTRECTOMY     partial   HEMOSTASIS CLIP PLACEMENT  11/22/2021   Procedure: HEMOSTASIS CLIP PLACEMENT;  Surgeon: Jenel Lucks, MD;  Location: [facility];  Service: Gastroenterology;;   LEFT HEART CATH AND CORS/GRAFTS ANGIOGRAPHY N/A 12/02/2020   Procedure: LEFT HEART CATH AND CORS/GRAFTS ANGIOGRAPHY;  Surgeon: Runell Gess, MD;  Location: [facility];  Service: Cardiovascular;  Laterality: N/A;   MEMBRANE PEEL  03/13/2012   Procedure: MEMBRANE PEEL;  Surgeon: Shade Flood, MD;  Location: [facility] OR;  Service: Ophthalmology;  Laterality: Left;   PARS PLANA VITRECTOMY  03/13/2012   Procedure: PARS PLANA VITRECTOMY WITH 23 GAUGE;  Surgeon: Shade Flood, MD;  Location: [facility] OR;  Service: Ophthalmology;  Laterality: Left;    Family History  Problem Relation Age of Onset   Hypertension Mother    Throat cancer Brother        pt feels alcohol related   Colon cancer Brother        1/2 brother   Stomach cancer Neg Hx    Pancreatic cancer Neg Hx     No Known Allergies  Current Outpatient Medications on  File Prior to Visit  Medication Sig Dispense Refill   acetaminophen (TYLENOL) 500 MG tablet Take 1,000 mg by mouth every 6 (six) hours as needed for mild pain or headache.     aluminum hydroxide-magnesium carbonate (GAVISCON) 95-358 MG/15ML SUSP Take 15 mLs by mouth as needed for indigestion.     amLODipine (NORVASC) 5 MG tablet TAKE 1/2 OF A TABLET BY MOUTH 2 TIMES DAILY. 90 tablet 3   aspirin EC 81 MG tablet Take 1 tablet (81 mg total) by mouth daily. 90 tablet 3   atorvastatin (LIPITOR) 40 MG  tablet TAKE 1 TABLET BY MOUTH EVERY DAY 90 tablet 3   carvedilol (COREG) 6.25 MG tablet TAKE 1 TABLET BY MOUTH 2 TIMES DAILY WITH A MEAL. 180 tablet 1   famotidine (PEPCID) 20 MG tablet TAKE 1 TABLET BY MOUTH TWICE A DAY 180 tablet 1   ferrous sulfate 325 (65 FE) MG EC tablet Take 1 tablet (325 mg total) by mouth daily with breakfast.     hydrALAZINE (APRESOLINE) 25 MG tablet TAKE 1 TABLET BY MOUTH TWICE A DAY 180 tablet 3   SYSTANE BALANCE 0.6 % SOLN Place 1 drop into both eyes 3 (three) times daily as needed (for dryness).     tamsulosin (FLOMAX) 0.4 MG CAPS capsule TAKE 1 CAPSULES BY MOUTH ONCE DAILY. 90 capsule 1   valsartan (DIOVAN) 320 MG tablet TAKE 1 TABLET BY MOUTH EVERY DAY 90 tablet 3   pantoprazole (PROTONIX) 40 MG tablet Take 1 tablet (40 mg total) by mouth 2 (two) times daily. (Patient taking differently: Take 40 mg by mouth daily.) 112 tablet 0   No current facility-administered medications on file prior to visit.    BP (!) 150/70   Pulse 65   Temp 98.2 F (36.8 C) (Oral)   Ht 5\' 7"  (1.702 m)   Wt 185 lb (83.9 kg)   SpO2 98%   BMI 28.98 kg/m       Objective:   Physical Exam Vitals and nursing note reviewed.  Constitutional:      Appearance: Normal appearance.  HENT:     Right Ear: Tympanic membrane, ear canal and external ear normal. There is no impacted cerumen.     Left Ear: Tympanic membrane, ear canal and external ear normal. There is no impacted cerumen.     Nose:  Nose normal. No congestion or rhinorrhea.     Mouth/Throat:     Mouth: Mucous membranes are dry.     Pharynx: Oropharynx is clear.  Eyes:     Extraocular Movements: Extraocular movements intact.     Right eye: Nystagmus present.     Left eye: Nystagmus present.     Conjunctiva/sclera: Conjunctivae normal.     Comments: Horizontal nystagmus bilaterally   Cardiovascular:     Rate and Rhythm: Normal rate and regular rhythm.     Pulses: Normal pulses.     Heart sounds: Normal heart sounds.  Pulmonary:     Effort: Pulmonary effort is normal.     Breath sounds: Normal breath sounds.  Musculoskeletal:        General: Normal range of motion.  Skin:    General: Skin is warm and dry.  Neurological:     General: No focal deficit present.     Mental Status: He is alert and oriented to person, place, and time.     Comments: Symptomatic with change in positions on exam table   Psychiatric:        Mood and Affect: Mood normal.        Behavior: Behavior normal.        Thought Content: Thought content normal.        Judgment: Judgment normal.       Assessment & Plan:  1. Vertigo - Exam consistent with Vertigo. Will prescribe Antivert to take as needed. He was advised that this medication can have a sedating effect.  - Stay well hydrated - Change positions slowly  - Follow up with PCP if not improving in the next 3-4 days  - meclizine (ANTIVERT) 25  MG tablet; Take 1 tablet (25 mg total) by mouth 3 (three) times daily as needed for dizziness.  Dispense: 30 tablet; Refill: 0  2. Essential hypertension - Elevated in office today. Continue to monitor at home. Follow up with his cardiologist if needed

## 2022-11-29 ENCOUNTER — Telehealth: Payer: Self-pay

## 2022-11-29 NOTE — Patient Outreach (Signed)
  Care Coordination   Initial Visit Note   11/29/2022 Name: Jerry Farrell MRN: [id number] DOB: 06/28/1945  Jerry Farrell is a 77 y.o. year old male who sees Tower, Audrie Gallus, MD for primary care. I spoke with  Reino Kent by phone today.  What matters to the patients health and wellness today?  Patient denies having any nursing and/ or community resource needs.     Goals Addressed             This Visit's Progress    COMPLETED: care coordination activities - no follow up needed.       Interventions Today    Flowsheet Row Most Recent Value  General Interventions   General Interventions Discussed/Reviewed General Interventions Discussed  [Care coordination services discussed. SDOH survey completed. AWV discussed and patient advised to contact provider office to schedule. Discussed vaccines. Advised to contact PCP office if care coordination services needed in the future.]              SDOH assessments and interventions completed:  Yes  SDOH Interventions Today    Flowsheet Row Most Recent Value  SDOH Interventions   Food Insecurity Interventions Intervention Not Indicated  Housing Interventions Intervention Not Indicated  Transportation Interventions Intervention Not Indicated        Care Coordination Interventions:  Yes, provided   Follow up plan: No further intervention required.   Encounter Outcome:  Pt. Visit Completed   George Ina RN,BSN,CCM [facility] Care Coordination [phone] direct line

## 2022-12-21 ENCOUNTER — Other Ambulatory Visit: Payer: Self-pay | Admitting: Cardiovascular Disease

## 2023-01-10 ENCOUNTER — Other Ambulatory Visit: Payer: Self-pay | Admitting: Cardiovascular Disease

## 2023-01-10 ENCOUNTER — Other Ambulatory Visit (HOSPITAL_BASED_OUTPATIENT_CLINIC_OR_DEPARTMENT_OTHER): Payer: Self-pay

## 2023-01-10 MED ORDER — COVID-19 MRNA VAC-TRIS(PFIZER) 30 MCG/0.3ML IM SUSY
0.3000 mL | PREFILLED_SYRINGE | Freq: Once | INTRAMUSCULAR | 0 refills | Status: AC
  Filled 2023-01-10: qty 0.3, 1d supply, fill #0

## 2023-01-10 MED ORDER — INFLUENZA VAC A&B SURF ANT ADJ 0.5 ML IM SUSY
0.5000 mL | PREFILLED_SYRINGE | Freq: Once | INTRAMUSCULAR | 0 refills | Status: AC
  Filled 2023-01-10: qty 0.5, 1d supply, fill #0

## 2023-01-14 ENCOUNTER — Other Ambulatory Visit: Payer: Self-pay | Admitting: Cardiovascular Disease

## 2023-02-07 ENCOUNTER — Other Ambulatory Visit: Payer: Self-pay | Admitting: Cardiovascular Disease

## 2023-03-26 DIAGNOSIS — H40051 Ocular hypertension, right eye: Secondary | ICD-10-CM | POA: Diagnosis not present

## 2023-04-11 ENCOUNTER — Other Ambulatory Visit: Payer: Self-pay | Admitting: Cardiovascular Disease

## 2023-04-15 ENCOUNTER — Other Ambulatory Visit: Payer: Self-pay | Admitting: Family Medicine

## 2023-04-19 DIAGNOSIS — R310 Gross hematuria: Secondary | ICD-10-CM | POA: Diagnosis not present

## 2023-04-19 DIAGNOSIS — R109 Unspecified abdominal pain: Secondary | ICD-10-CM | POA: Diagnosis not present

## 2023-05-07 ENCOUNTER — Other Ambulatory Visit: Payer: Self-pay

## 2023-05-07 ENCOUNTER — Telehealth: Payer: Self-pay | Admitting: Cardiovascular Disease

## 2023-05-07 MED ORDER — AMLODIPINE BESYLATE 5 MG PO TABS: 5.0000 mg | ORAL_TABLET | Freq: Every day | ORAL | 3 refills | Status: DC

## 2023-05-07 NOTE — Telephone Encounter (Signed)
 Called and spoke with patient who states that since the medication for his blood pressure changed shape he inadvertently started taking a whole pill of the 5 mg of amlodipine . He states that he has noticed that his blood pressure reading have been in the 124-138 over 70-76. He states that since he took double the dose he is now completely out of his medication and pharmacist said can't be refilled until Feb 2nd 2025. Informed patient I would send message to Dr Court to advise.

## 2023-05-07 NOTE — Telephone Encounter (Signed)
 Okay to increase his dose to 5 mg a day.   Called patient and confirmed his new dose with him and script sent to [facility] as requested.

## 2023-05-07 NOTE — Telephone Encounter (Signed)
 Pt c/o medication issue:  1. Name of Medication:  amLODipine  (NORVASC ) 5 MG tablet     2. How are you currently taking this medication (dosage and times per day)? TAKE 1/2 OF A TABLET BY MOUTH 2 TIMES DAILY.   3. Are you having a reaction (difficulty breathing--STAT)?No  4. What is your medication issue? Patient is calling because he was getting the medication as a long tablet and now the medication had changed to a round tablet. Patient was confused because he would break the tablets in half, but now since it was the round tablets he would take the tablet. Patient stated he ran out of medication and the insurance will not cover another refill until Feb 5th. Please advise.

## 2023-05-15 DIAGNOSIS — R310 Gross hematuria: Secondary | ICD-10-CM | POA: Diagnosis not present

## 2023-05-15 DIAGNOSIS — R1084 Generalized abdominal pain: Secondary | ICD-10-CM | POA: Diagnosis not present

## 2023-05-29 ENCOUNTER — Encounter: Payer: Self-pay | Admitting: Gastroenterology

## 2023-05-29 ENCOUNTER — Other Ambulatory Visit (INDEPENDENT_AMBULATORY_CARE_PROVIDER_SITE_OTHER): Payer: Medicare Other

## 2023-05-29 ENCOUNTER — Ambulatory Visit: Payer: Medicare Other | Admitting: Gastroenterology

## 2023-05-29 VITALS — BP 124/70 | HR 66 | Ht 67.0 in

## 2023-05-29 DIAGNOSIS — Z8711 Personal history of peptic ulcer disease: Secondary | ICD-10-CM | POA: Diagnosis not present

## 2023-05-29 DIAGNOSIS — D500 Iron deficiency anemia secondary to blood loss (chronic): Secondary | ICD-10-CM

## 2023-05-29 DIAGNOSIS — K5900 Constipation, unspecified: Secondary | ICD-10-CM

## 2023-05-29 DIAGNOSIS — Z800 Family history of malignant neoplasm of digestive organs: Secondary | ICD-10-CM

## 2023-05-29 DIAGNOSIS — K5904 Chronic idiopathic constipation: Secondary | ICD-10-CM

## 2023-05-29 DIAGNOSIS — Z860101 Personal history of adenomatous and serrated colon polyps: Secondary | ICD-10-CM | POA: Diagnosis not present

## 2023-05-29 DIAGNOSIS — R310 Gross hematuria: Secondary | ICD-10-CM | POA: Diagnosis not present

## 2023-05-29 DIAGNOSIS — D509 Iron deficiency anemia, unspecified: Secondary | ICD-10-CM | POA: Diagnosis not present

## 2023-05-29 LAB — CBC WITH DIFFERENTIAL/PLATELET
Basophils Absolute: 0.1 10*3/uL (ref 0.0–0.1)
Basophils Relative: 0.6 % (ref 0.0–3.0)
Eosinophils Absolute: 0.1 10*3/uL (ref 0.0–0.7)
Eosinophils Relative: 0.6 % (ref 0.0–5.0)
HCT: 48 % (ref 39.0–52.0)
Hemoglobin: 16.1 g/dL (ref 13.0–17.0)
Lymphocytes Relative: 12.6 % (ref 12.0–46.0)
Lymphs Abs: 1.1 10*3/uL (ref 0.7–4.0)
MCHC: 33.7 g/dL (ref 30.0–36.0)
MCV: 88.7 fL (ref 78.0–100.0)
Monocytes Absolute: 0.7 10*3/uL (ref 0.1–1.0)
Monocytes Relative: 7.5 % (ref 3.0–12.0)
Neutro Abs: 7.1 10*3/uL (ref 1.4–7.7)
Neutrophils Relative %: 78.7 % — ABNORMAL HIGH (ref 43.0–77.0)
Platelets: 148 10*3/uL — ABNORMAL LOW (ref 150.0–400.0)
RBC: 5.41 Mil/uL (ref 4.22–5.81)
RDW: 13.6 % (ref 11.5–15.5)
WBC: 9 10*3/uL (ref 4.0–10.5)

## 2023-05-29 LAB — IBC PANEL
Iron: 98 ug/dL (ref 42–165)
Saturation Ratios: 32.6 % (ref 20.0–50.0)
TIBC: 301 ug/dL (ref 250.0–450.0)
Transferrin: 215 mg/dL (ref 212.0–360.0)

## 2023-05-29 MED ORDER — NA SULFATE-K SULFATE-MG SULF 17.5-3.13-1.6 GM/177ML PO SOLN: 1.0000 | Freq: Once | ORAL | 0 refills | Status: AC

## 2023-05-29 NOTE — Progress Notes (Signed)
 Jerry Farrell    [id number]    08-Mar-1946  Primary Care Physician:Tower, Laine LABOR, MD  Referring Physician: Tower, Laine LABOR, MD [address] [place],  [place] [address]   Chief complaint:  Melena, gastric ulcers, anemia  Discussed the use of AI scribe software for clinical note transcription with the patient, who gave verbal consent to proceed.  History of Present Illness   Jerry Farrell is a 78 year old very pleasant gentleman with anemia and a history of precancerous polyps who presents for a follow-up regarding his gastrointestinal symptoms and colonoscopy scheduling.  He has been experiencing gastrointestinal symptoms, including a change in stool color to greenish dark, which he attributes to iron supplements. He was hospitalized in August for anemia, and subsequent blood work has shown improvement. He is unsure of the specific blood tests done during his recent urology checkup but mentions a CT scan scheduled for February 17th. His last colonoscopy was in 2017, which revealed precancerous polyps, and he was advised to have another in five years due to family history of cancer.  He has been experiencing rectal itching and tight bowel movements for about a month, requiring straining and causing a burning sensation. He is not currently taking any stool softeners. No use of Advil, BC, ibuprofen, or Aleve.  He has a history of coronary artery disease and experiences dizziness and shortness of breath, especially when bending over or lifting. He last saw his cardiologist in June 2024, who performed a catheterization but could not access one of the blockages. He is on medical therapy for his condition.  He experiences back pain, particularly when twisting or turning, and has a history of sciatica. He notes swelling in his feet, especially when sitting, which he attributes to amlodipine . He also reports occasional severe itching, for which he uses a prescribed cream.      Small bowel push enteroscopy August page 2023 - Tortuous esophagus. - Clotted blood in the gastric body. - Non- bleeding gastric ulcers with a visible vessel. Clips ( MR conditional) were placed. These ulcers were likely secondary to gastric biopsies taken July 25th. Further biopsies were not taken. - Patent Billroth I gastroduodenostomy was found, characterized by healthy appearing mucosa. - Normal examined duodenum. - The examined portion of the jejunum was normal.  EGD November 15, 2021 - The examined esophagus was normal. - Evidence of a patent Billroth I gastroduodenostomy was found. A gastric pouch was found. The gastroduodenal anastomosis was characterized by healthy appearing mucosa. This was traversed. - Striped moderately erythematous mucosa without bleeding was found in the entire examined stomach. Biopsies were taken with a cold forceps for histology. - The exam of the stomach was otherwise normal except for a clip on the greater curvature. - The examined duodenum was normal.  Colonoscopy February 15, 2016 - One 4 mm polyp in the cecum, removed with a cold biopsy forceps. Resected and retrieved. - Internal hemorrhoids. - The examination was otherwise normal on direct and retroflexion views.  Outpatient Encounter Medications as of 05/29/2023  Medication Sig   amLODipine  (NORVASC ) 5 MG tablet Take 1 tablet (5 mg total) by mouth daily.   aspirin  EC 81 MG tablet Take 1 tablet (81 mg total) by mouth daily.   atorvastatin  (LIPITOR ) 40 MG tablet TAKE 1 TABLET BY MOUTH EVERY DAY   carvedilol  (COREG ) 6.25 MG tablet TAKE 1 TABLET BY MOUTH TWICE A DAY WITH FOOD  famotidine  (PEPCID ) 20 MG tablet TAKE 1 TABLET BY MOUTH TWICE A DAY   hydrALAZINE  (APRESOLINE ) 25 MG tablet TAKE 1 TABLET BY MOUTH TWICE A DAY   meclizine  (ANTIVERT ) 25 MG tablet Take 1 tablet (25 mg total) by mouth 3 (three) times daily as needed for dizziness.   Na Sulfate-K Sulfate-Mg Sulfate concentrate 17.5-3.13-1.6 GM/177ML SOLN Take 1  kit by mouth once for 1 dose.   SYSTANE BALANCE 0.6 % SOLN Place 1 drop into both eyes 3 (three) times daily as needed (for dryness).   tamsulosin  (FLOMAX ) 0.4 MG CAPS capsule TAKE 1 CAPSULES BY MOUTH ONCE DAILY.   valsartan  (DIOVAN ) 320 MG tablet TAKE 1 TABLET BY MOUTH EVERY DAY   acetaminophen  (TYLENOL ) 500 MG tablet Take 1,000 mg by mouth every 6 (six) hours as needed for mild pain or headache. (Patient not taking: Reported on 05/29/2023)   aluminum  hydroxide-magnesium  carbonate (GAVISCON) 95-358 MG/15ML SUSP Take 15 mLs by mouth as needed for indigestion. (Patient not taking: Reported on 05/29/2023)   ferrous sulfate  325 (65 FE) MG EC tablet Take 1 tablet (325 mg total) by mouth daily with breakfast.   pantoprazole  (PROTONIX ) 40 MG tablet Take 1 tablet (40 mg total) by mouth 2 (two) times daily. (Patient taking differently: Take 40 mg by mouth daily.)   No facility-administered encounter medications on file as of 05/29/2023.    Allergies as of 05/29/2023   (No Known Allergies)    Past Medical History:  Diagnosis Date   Adenomatous colon polyp    BPH (benign prostatic hyperplasia)    CAD (coronary artery disease)    cardiolite ok 3/05, stress test- low risk study 11/06   Chronic low back pain    with radiculopathy   ED (erectile dysfunction)    Gallstones    abd US - stable hamangioma, gallbladder sludge and tiny stones 09/2003   Gastritis    GERD (gastroesophageal reflux disease)    H pylori ulcer    Hyperlipidemia    Hypertension    Transfusion history     Past Surgical History:  Procedure Laterality Date   CATARACT EXTRACTION W/PHACO  03/13/2012   Procedure: CATARACT EXTRACTION PHACO AND INTRAOCULAR LENS PLACEMENT (IOC);  Surgeon: Jestine Bunnell, MD;  Location: [facility] OR;  Service: Ophthalmology;  Laterality: Left;   CORONARY ARTERY BYPASS GRAFT  1998   x 1  done here at [facility]   ENTEROSCOPY N/A 11/22/2021   Procedure: ENTEROSCOPY;  Surgeon: Stacia Glendia BRAVO, MD;  Location: [facility];  Service: Gastroenterology;  Laterality: N/A;   ESOPHAGOGASTRODUODENOSCOPY (EGD) WITH PROPOFOL  N/A 06/07/2016   Procedure: ESOPHAGOGASTRODUODENOSCOPY (EGD) WITH PROPOFOL ;  Surgeon: Gustav Shila GAILS, MD;  Location: [facility];  Service: Endoscopy;  Laterality: N/A;   GASTRECTOMY     partial   HEMOSTASIS CLIP PLACEMENT  11/22/2021   Procedure: HEMOSTASIS CLIP PLACEMENT;  Surgeon: Stacia Glendia BRAVO, MD;  Location: [facility];  Service: Gastroenterology;;   LEFT HEART CATH AND CORS/GRAFTS ANGIOGRAPHY N/A 12/02/2020   Procedure: LEFT HEART CATH AND CORS/GRAFTS ANGIOGRAPHY;  Surgeon: Court Dorn PARAS, MD;  Location: [facility];  Service: Cardiovascular;  Laterality: N/A;   MEMBRANE PEEL  03/13/2012   Procedure: MEMBRANE PEEL;  Surgeon: Jestine Bunnell, MD;  Location: [facility] OR;  Service: Ophthalmology;  Laterality: Left;   PARS PLANA VITRECTOMY  03/13/2012   Procedure: PARS PLANA VITRECTOMY WITH 23 GAUGE;  Surgeon: Jestine Bunnell, MD;  Location: [facility] OR;  Service: Ophthalmology;  Laterality: Left;    Family History  Problem Relation Age of Onset   Hypertension Mother    Throat cancer Brother        pt feels alcohol related   Colon cancer Brother        1/2 brother   Stomach cancer Neg Hx    Pancreatic cancer Neg Hx     Social History   Socioeconomic History   Marital status: Married    Spouse name: Not on file   Number of children: 3   Years of education: Not on file   Highest education level: Not on file  Occupational History   Occupation: [employer]    Employer: RETIRED  Tobacco Use   Smoking status: Never   Smokeless tobacco: Never  Vaping Use   Vaping status: Never Used  Substance and Sexual Activity   Alcohol use: No    Alcohol/week: 0.0 standard drinks of alcohol   Drug use: No   Sexual activity: Yes  Other Topics Concern   Not on file  Social History Narrative   Not on file   Social Drivers of Health   Financial Resource Strain: Low Risk  (06/27/2022)    Overall Financial Resource Strain (CARDIA)    Difficulty of Paying Living Expenses: Not hard at all  Food Insecurity: No Food Insecurity (11/29/2022)   Hunger Vital Sign    Worried About Running Out of Food in the Last Year: Never true    Ran Out of Food in the Last Year: Never true  Transportation Needs: No Transportation Needs (11/29/2022)   PRAPARE - Administrator, Civil Service (Medical): No    Lack of Transportation (Non-Medical): No  Physical Activity: Inactive (06/27/2022)   Exercise Vital Sign    Days of Exercise per Week: 0 days    Minutes of Exercise per Session: 0 min  Stress: No Stress Concern Present (06/27/2022)   [employer] of Occupational Health - Occupational Stress Questionnaire    Feeling of Stress : Not at all  Social Connections: Moderately Integrated (06/27/2022)   Social Connection and Isolation Panel [NHANES]    Frequency of Communication with Friends and Family: More than three times a week    Frequency of Social Gatherings with Friends and Family: Three times a week    Attends Religious Services: More than 4 times per year    Active Member of Clubs or Organizations: No    Attends Banker Meetings: Never    Marital Status: Married  Catering Manager Violence: Not At Risk (06/27/2022)   Humiliation, Afraid, Rape, and Kick questionnaire    Fear of Current or Ex-Partner: No    Emotionally Abused: No    Physically Abused: No    Sexually Abused: No      Review of systems: All other review of systems negative except as mentioned in the HPI.   Physical Exam: Vitals:   05/29/23 0942  BP: 124/70  Pulse: 66   Body mass index is 28.98 kg/m. Gen:      No acute distress HEENT:  sclera anicteric CV: s1s2 rrr, no murmur Lungs: B/l clear. Abd:      soft, non-tender; no palpable masses, no distension Ext:    No edema Neuro: alert and oriented x 3 Psych: normal mood and affect  Data Reviewed:  Reviewed labs, radiology imaging, old  records and pertinent past GI work up     Assessment and Plan    Iron Deficiency Anemia Iron deficiency anemia, likely secondary to previous gastrointestinal bleeding due to  gastric ulcers. Reports no more melena, consistent with iron supplementation causing dark green stool. Blood work from August showed improvement. Currently on iron supplementation. Discussed the importance of continuing iron to maintain normal blood counts and iron levels. Advised to avoid NSAIDs to prevent peptic ulcers. -Follow-up CBC and iron panel. - Continue iron supplementation. - Avoid NSAIDs (ibuprofen, naproxen) to prevent peptic ulcers.  Constipation Constipation likely secondary to iron supplementation. Reports rectal itching, hard stools, and straining with defecation. Discussed starting stool softeners to alleviate symptoms. Emphasized managing constipation to prevent complications. - Start stool softener (docusate) 1 tablet nightly. - Schedule colonoscopy to evaluate for polyps or other causes of constipation.  Colorectal Cancer Screening Due for a colonoscopy, last performed in 2017, which showed precancerous polyps. Given family history of cancer and personal history of precancerous polyps, recommended colonoscopy every five years. Discussed the importance of early detection and removal of polyps to prevent cancer. Explained that colonoscopies are generally stopped between ages 24-80, but given his overall health, it is reasonable to proceed. - Schedule colonoscopy for February 14th at 8:00 AM.  Coronary Artery Disease Coronary artery disease with blockages in the coronary arteries. Last cardiology visit was in June 2024 with Dr. Court. Discussed the importance of regular follow-ups to monitor the condition and avoid unnecessary interventions unless critical. - Follow up with cardiologist annually or biannually.  General Health Maintenance Routine health maintenance is up to date. Upcoming telephone  appointment for annual wellness in March. Discussed the importance of regular check-ups to monitor overall health. - Perform blood work today to check blood counts and iron levels.  Follow-up - Follow up in office after the colonoscopy.       This visit required 40 minutes of patient care (this includes precharting, chart review, review of results, face-to-face time used for counseling as well as treatment plan and follow-up. The patient was provided an opportunity to ask questions and all were answered. The patient agreed with the plan and demonstrated an understanding of the instructions.  LOIS Wilkie Mcgee , MD    CC: Tower, Laine LABOR, MD

## 2023-05-29 NOTE — Patient Instructions (Addendum)
 VISIT SUMMARY:  Today, we discussed your gastrointestinal symptoms, colonoscopy scheduling, and other health concerns. We reviewed your recent history of anemia, gastrointestinal symptoms, and coronary artery disease. We also discussed your back pain, swelling in your feet, and occasional severe itching.  YOUR PLAN:  -IRON DEFICIENCY ANEMIA: Iron deficiency anemia is a condition where your body lacks enough iron to produce healthy red blood cells. You should continue taking your iron supplements to maintain normal blood counts and iron levels. Avoid NSAIDs like ibuprofen and naproxen to prevent stomach ulcers. We will request your CT scan results from your urologist.  -CONSTIPATION: Constipation is when you have infrequent or difficult bowel movements. This may be due to your iron supplements. To help with this, start taking a stool softener (docusate) 1 tablet nightly. We will also schedule a colonoscopy to check for polyps or other causes of constipation.  -COLORECTAL CANCER SCREENING: Colorectal cancer screening is important for detecting and removing polyps before they turn into cancer. Given your history of precancerous polyps and family history of cancer, you should have a colonoscopy every five years. Your colonoscopy is scheduled for February 14th at 8:00 AM.  -CORONARY ARTERY DISEASE: Coronary artery disease is a condition where the blood vessels supplying your heart are narrowed or blocked. If you experience any chest pain, dizziness and shortness of breath with exertion, it is important to follow up with your cardiologist regularly to monitor your condition. Your last visit was in June 2024 with Dr. Court.  -GENERAL HEALTH MAINTENANCE: Your routine health maintenance is up to date. You have an upcoming telephone appointment for your annual wellness check in March. Regular check-ups are important to monitor your overall health. We will perform blood work today to check your blood counts and  iron levels.  INSTRUCTIONS:  Please follow up in the office a few months after your colonoscopy.  Your provider has requested that you go to the basement level for lab work before leaving today. Press B on the elevator. The lab is located at the first door on the left as you exit the elevator.   I appreciate the  opportunity to care for you  Thank You   Kavitha Nandigam , MD

## 2023-06-01 ENCOUNTER — Encounter: Payer: Self-pay | Admitting: Gastroenterology

## 2023-06-08 ENCOUNTER — Encounter: Payer: Self-pay | Admitting: Gastroenterology

## 2023-06-08 ENCOUNTER — Ambulatory Visit (AMBULATORY_SURGERY_CENTER): Payer: Medicare Other | Admitting: Gastroenterology

## 2023-06-08 VITALS — BP 136/72 | HR 60 | Temp 98.5°F | Resp 18 | Ht 67.0 in | Wt 185.0 lb

## 2023-06-08 DIAGNOSIS — Z1211 Encounter for screening for malignant neoplasm of colon: Secondary | ICD-10-CM | POA: Diagnosis not present

## 2023-06-08 DIAGNOSIS — Z800 Family history of malignant neoplasm of digestive organs: Secondary | ICD-10-CM

## 2023-06-08 DIAGNOSIS — K648 Other hemorrhoids: Secondary | ICD-10-CM

## 2023-06-08 DIAGNOSIS — K644 Residual hemorrhoidal skin tags: Secondary | ICD-10-CM | POA: Diagnosis not present

## 2023-06-08 DIAGNOSIS — D120 Benign neoplasm of cecum: Secondary | ICD-10-CM | POA: Diagnosis not present

## 2023-06-08 DIAGNOSIS — D123 Benign neoplasm of transverse colon: Secondary | ICD-10-CM

## 2023-06-08 DIAGNOSIS — I10 Essential (primary) hypertension: Secondary | ICD-10-CM | POA: Diagnosis not present

## 2023-06-08 DIAGNOSIS — Z860101 Personal history of adenomatous and serrated colon polyps: Secondary | ICD-10-CM | POA: Diagnosis not present

## 2023-06-08 DIAGNOSIS — I2510 Atherosclerotic heart disease of native coronary artery without angina pectoris: Secondary | ICD-10-CM | POA: Diagnosis not present

## 2023-06-08 MED ORDER — SODIUM CHLORIDE 0.9 % IV SOLN: 500.0000 mL | INTRAVENOUS | Status: DC

## 2023-06-08 NOTE — Op Note (Signed)
[facility] Patient Name: Jerry Farrell Procedure Date: 06/08/2023 8:10 AM MRN: [id number] Endoscopist: Napoleon Form , MD, [id number] Age: 78 Referring MD:  Date of Birth: December 17, 1945 Gender: Male Account #: [id number] Procedure:                Colonoscopy Indications:              Screening in patient at increased risk: Family                            history of 1st-degree relative with colorectal                            cancer before age 45 years Medicines:                Monitored Anesthesia Care Procedure:                Pre-Anesthesia Assessment:                           - Prior to the procedure, a History and Physical                            was performed, and patient medications and                            allergies were reviewed. The patient's tolerance of                            previous anesthesia was also reviewed. The risks                            and benefits of the procedure and the sedation                            options and risks were discussed with the patient.                            All questions were answered, and informed consent                            was obtained. Prior Anticoagulants: The patient has                            taken no anticoagulant or antiplatelet agents. ASA                            Grade Assessment: III - A patient with severe                            systemic disease. After reviewing the risks and                            benefits, the patient was deemed in satisfactory  condition to undergo the procedure.                           After obtaining informed consent, the colonoscope                            was passed under direct vision. Throughout the                            procedure, the patient's blood pressure, pulse, and                            oxygen saturations were monitored continuously. The                            [id number]-HQ190L Colonoscope [id number]  was introduced                            through the anus and advanced to the the cecum,                            identified by appendiceal orifice and ileocecal                            valve. The colonoscopy was performed without                            difficulty. The patient tolerated the procedure                            well. The quality of the bowel preparation was                            adequate. The ileocecal valve, appendiceal orifice,                            and rectum were photographed. Scope In: 8:13:32 AM Scope Out: 8:30:43 AM Scope Withdrawal Time: 0 hours 13 minutes 16 seconds  Total Procedure Duration: 0 hours 17 minutes 11 seconds  Findings:                 The perianal and digital rectal examinations were                            normal.                           Two sessile polyps were found in the transverse                            colon and cecum. The polyps were 3 to 4 mm in size.                            These polyps were removed with a cold snare.  Resection and retrieval were complete.                           Non-bleeding external and internal hemorrhoids were                            found during retroflexion. The hemorrhoids were                            medium-sized. Complications:            No immediate complications. Estimated Blood Loss:     Estimated blood loss was minimal. Impression:               - Two 3 to 4 mm polyps in the transverse colon and                            in the cecum, removed with a cold snare. Resected                            and retrieved.                           - Non-bleeding external and internal hemorrhoids. Recommendation:           - Patient has a contact number available for                            emergencies. The signs and symptoms of potential                            delayed complications were discussed with the                            patient.  Return to normal activities tomorrow.                            Written discharge instructions were provided to the                            patient.                           - Resume previous diet.                           - Continue present medications.                           - Await pathology results.                           - No repeat colonoscopy due to age. Napoleon Form, MD 06/08/2023 8:42:49 AM This report has been signed electronically.

## 2023-06-08 NOTE — Progress Notes (Signed)
[facility] History and Physical   Primary Care Physician:  Tower, Audrie Gallus, MD   Reason for Procedure:  Family history of colon cancer, h/o adenomatous colon polyps  Plan:    Surveillance colonoscopy with possible interventions as needed     HPI: Jerry Farrell is a very pleasant 78 y.o. male here for surveillance colonoscopy. Denies any nausea, vomiting, abdominal pain, melena or bright red blood per rectum  The risks and benefits as well as alternatives of endoscopic procedure(s) have been discussed and reviewed. All questions answered. The patient agrees to proceed.    Past Medical History:  Diagnosis Date   Adenomatous colon polyp    BPH (benign prostatic hyperplasia)    CAD (coronary artery disease)    cardiolite ok 3/05, stress test- low risk study 11/06   Chronic low back pain    with radiculopathy   ED (erectile dysfunction)    Gallstones    abd [place]- stable hamangioma, gallbladder sludge and tiny stones 09/2003   Gastritis    GERD (gastroesophageal reflux disease)    H pylori ulcer    Hyperlipidemia    Hypertension    Transfusion history     Past Surgical History:  Procedure Laterality Date   CATARACT EXTRACTION W/PHACO  03/13/2012   Procedure: CATARACT EXTRACTION PHACO AND INTRAOCULAR LENS PLACEMENT (IOC);  Surgeon: Shade Flood, MD;  Location: [facility] OR;  Service: Ophthalmology;  Laterality: Left;   CORONARY ARTERY BYPASS GRAFT  1998   x 1  done here at [facility]   ENTEROSCOPY N/A 11/22/2021   Procedure: ENTEROSCOPY;  Surgeon: Jenel Lucks, MD;  Location: [facility];  Service: Gastroenterology;  Laterality: N/A;   ESOPHAGOGASTRODUODENOSCOPY (EGD) WITH PROPOFOL N/A 06/07/2016   Procedure: ESOPHAGOGASTRODUODENOSCOPY (EGD) WITH PROPOFOL;  Surgeon: Napoleon Form, MD;  Location: [facility];  Service: Endoscopy;  Laterality: N/A;   GASTRECTOMY     partial   HEMOSTASIS CLIP PLACEMENT  11/22/2021   Procedure: HEMOSTASIS CLIP PLACEMENT;  Surgeon:  Jenel Lucks, MD;  Location: [facility];  Service: Gastroenterology;;   LEFT HEART CATH AND CORS/GRAFTS ANGIOGRAPHY N/A 12/02/2020   Procedure: LEFT HEART CATH AND CORS/GRAFTS ANGIOGRAPHY;  Surgeon: Runell Gess, MD;  Location: [facility];  Service: Cardiovascular;  Laterality: N/A;   MEMBRANE PEEL  03/13/2012   Procedure: MEMBRANE PEEL;  Surgeon: Shade Flood, MD;  Location: [facility] OR;  Service: Ophthalmology;  Laterality: Left;   PARS PLANA VITRECTOMY  03/13/2012   Procedure: PARS PLANA VITRECTOMY WITH 23 GAUGE;  Surgeon: Shade Flood, MD;  Location: [facility] OR;  Service: Ophthalmology;  Laterality: Left;    Prior to Admission medications   Medication Sig Start Date End Date Taking? Authorizing Provider  amLODipine (NORVASC) 5 MG tablet Take 1 tablet (5 mg total) by mouth daily. 05/07/23 08/05/23 Yes Runell Gess, MD  aspirin EC 81 MG tablet Take 1 tablet (81 mg total) by mouth daily. 12/09/21  Yes Lewie Chamber, MD  atorvastatin (LIPITOR) 40 MG tablet TAKE 1 TABLET BY MOUTH EVERY DAY 01/10/23  Yes Runell Gess, MD  carvedilol (COREG) 6.25 MG tablet TAKE 1 TABLET BY MOUTH TWICE A DAY WITH FOOD 02/07/23  Yes Runell Gess, MD  hydrALAZINE (APRESOLINE) 25 MG tablet TAKE 1 TABLET BY MOUTH TWICE A DAY 01/10/23  Yes Runell Gess, MD  SYSTANE BALANCE 0.6 % SOLN Place 1 drop into both eyes 3 (three) times daily as needed (for dryness).   Yes [provider]  tamsulosin (  FLOMAX) 0.4 MG CAPS capsule TAKE 1 CAPSULES BY MOUTH ONCE DAILY. 04/16/23  Yes Tower, Audrie Gallus, MD  valsartan (DIOVAN) 320 MG tablet TAKE 1 TABLET BY MOUTH EVERY DAY 08/09/22  Yes Runell Gess, MD  acetaminophen (TYLENOL) 500 MG tablet Take 1,000 mg by mouth every 6 (six) hours as needed for mild pain or headache. Patient not taking: Reported on 05/29/2023    [provider]  aluminum hydroxide-magnesium carbonate (GAVISCON) 95-358 MG/15ML SUSP Take 15 mLs by mouth as needed for  indigestion. Patient not taking: Reported on 05/29/2023    [provider]  famotidine (PEPCID) 20 MG tablet TAKE 1 TABLET BY MOUTH TWICE A DAY 05/22/22   Tower, Audrie Gallus, MD  ferrous sulfate 325 (65 FE) MG EC tablet Take 1 tablet (325 mg total) by mouth daily with breakfast. 11/24/21 11/24/22  Lewie Chamber, MD  meclizine (ANTIVERT) 25 MG tablet Take 1 tablet (25 mg total) by mouth 3 (three) times daily as needed for dizziness. 11/17/22   Nafziger, Kandee Keen, NP  pantoprazole (PROTONIX) 40 MG tablet Take 1 tablet (40 mg total) by mouth 2 (two) times daily. Patient taking differently: Take 40 mg by mouth daily. 11/24/21 01/19/22  Lewie Chamber, MD    Current Outpatient Medications  Medication Sig Dispense Refill   amLODipine (NORVASC) 5 MG tablet Take 1 tablet (5 mg total) by mouth daily. 90 tablet 3   aspirin EC 81 MG tablet Take 1 tablet (81 mg total) by mouth daily. 90 tablet 3   atorvastatin (LIPITOR) 40 MG tablet TAKE 1 TABLET BY MOUTH EVERY DAY 90 tablet 3   carvedilol (COREG) 6.25 MG tablet TAKE 1 TABLET BY MOUTH TWICE A DAY WITH FOOD 180 tablet 2   hydrALAZINE (APRESOLINE) 25 MG tablet TAKE 1 TABLET BY MOUTH TWICE A DAY 180 tablet 3   SYSTANE BALANCE 0.6 % SOLN Place 1 drop into both eyes 3 (three) times daily as needed (for dryness).     tamsulosin (FLOMAX) 0.4 MG CAPS capsule TAKE 1 CAPSULES BY MOUTH ONCE DAILY. 90 capsule 0   valsartan (DIOVAN) 320 MG tablet TAKE 1 TABLET BY MOUTH EVERY DAY 90 tablet 3   acetaminophen (TYLENOL) 500 MG tablet Take 1,000 mg by mouth every 6 (six) hours as needed for mild pain or headache. (Patient not taking: Reported on 05/29/2023)     aluminum hydroxide-magnesium carbonate (GAVISCON) 95-358 MG/15ML SUSP Take 15 mLs by mouth as needed for indigestion. (Patient not taking: Reported on 05/29/2023)     famotidine (PEPCID) 20 MG tablet TAKE 1 TABLET BY MOUTH TWICE A DAY 180 tablet 1   ferrous sulfate 325 (65 FE) MG EC tablet Take 1 tablet (325 mg total) by mouth  daily with breakfast.     meclizine (ANTIVERT) 25 MG tablet Take 1 tablet (25 mg total) by mouth 3 (three) times daily as needed for dizziness. 30 tablet 0   pantoprazole (PROTONIX) 40 MG tablet Take 1 tablet (40 mg total) by mouth 2 (two) times daily. (Patient taking differently: Take 40 mg by mouth daily.) 112 tablet 0   Current Facility-Administered Medications  Medication Dose Route Frequency Provider Last Rate Last Admin   0.9 %  sodium chloride infusion  500 mL Intravenous Continuous Karole Oo, Eleonore Chiquito, MD        Allergies as of 06/08/2023   (No Known Allergies)    Family History  Problem Relation Age of Onset   Hypertension Mother    Throat cancer Brother  pt feels alcohol related   Colon cancer Brother        1/2 brother   Stomach cancer Neg Hx    Pancreatic cancer Neg Hx     Social History   Socioeconomic History   Marital status: Married    Spouse name: Not on file   Number of children: 3   Years of education: Not on file   Highest education level: Not on file  Occupational History   Occupation: [employer]    Employer: RETIRED  Tobacco Use   Smoking status: Never   Smokeless tobacco: Never  Vaping Use   Vaping status: Never Used  Substance and Sexual Activity   Alcohol use: No    Alcohol/week: 0.0 standard drinks of alcohol   Drug use: No   Sexual activity: Yes  Other Topics Concern   Not on file  Social History Narrative   Not on file   Social Drivers of Health   Financial Resource Strain: Low Risk  (06/27/2022)   Overall Financial Resource Strain (CARDIA)    Difficulty of Paying Living Expenses: Not hard at all  Food Insecurity: No Food Insecurity (11/29/2022)   Hunger Vital Sign    Worried About Running Out of Food in the Last Year: Never true    Ran Out of Food in the Last Year: Never true  Transportation Needs: No Transportation Needs (11/29/2022)   PRAPARE - Administrator, Civil Service (Medical): No    Lack of Transportation  (Non-Medical): No  Physical Activity: Inactive (06/27/2022)   Exercise Vital Sign    Days of Exercise per Week: 0 days    Minutes of Exercise per Session: 0 min  Stress: No Stress Concern Present (06/27/2022)   [employer] of Occupational Health - Occupational Stress Questionnaire    Feeling of Stress : Not at all  Social Connections: Moderately Integrated (06/27/2022)   Social Connection and Isolation Panel [NHANES]    Frequency of Communication with Friends and Family: More than three times a week    Frequency of Social Gatherings with Friends and Family: Three times a week    Attends Religious Services: More than 4 times per year    Active Member of Clubs or Organizations: No    Attends Banker Meetings: Never    Marital Status: Married  Catering manager Violence: Not At Risk (06/27/2022)   Humiliation, Afraid, Rape, and Kick questionnaire    Fear of Current or Ex-Partner: No    Emotionally Abused: No    Physically Abused: No    Sexually Abused: No    Review of Systems:  All other review of systems negative except as mentioned in the HPI.  Physical Exam: Vital signs in last 24 hours: BP (!) 178/81   Pulse 67   Temp 98.5 F (36.9 C)   Ht 5\' 7"  (1.702 m)   Wt 185 lb (83.9 kg)   SpO2 98%   BMI 28.98 kg/m  General:   Alert, NAD Lungs:  Clear .   Heart:  Regular rate and rhythm Abdomen:  Soft, nontender and nondistended. Neuro/Psych:  Alert and cooperative. Normal mood and affect. A and O x 3  Reviewed labs, radiology imaging, old records and pertinent past GI work up  Patient is appropriate for planned procedure(s) and anesthesia in an ambulatory setting   K. Scherry Ran , MD [phone]

## 2023-06-08 NOTE — Progress Notes (Signed)
Pt's states no medical or surgical changes since previsit or office visit.

## 2023-06-08 NOTE — Patient Instructions (Signed)

## 2023-06-08 NOTE — Progress Notes (Signed)
Sedate, gd SR, tolerated procedure well, VSS, report to RN

## 2023-06-08 NOTE — Progress Notes (Signed)
Called to room to assist during endoscopic procedure.  Patient ID and intended procedure confirmed with present staff. Received instructions for my participation in the procedure from the performing physician.

## 2023-06-11 ENCOUNTER — Telehealth: Payer: Self-pay

## 2023-06-11 DIAGNOSIS — N281 Cyst of kidney, acquired: Secondary | ICD-10-CM | POA: Diagnosis not present

## 2023-06-11 DIAGNOSIS — K7689 Other specified diseases of liver: Secondary | ICD-10-CM | POA: Diagnosis not present

## 2023-06-11 DIAGNOSIS — K8020 Calculus of gallbladder without cholecystitis without obstruction: Secondary | ICD-10-CM | POA: Diagnosis not present

## 2023-06-11 DIAGNOSIS — R310 Gross hematuria: Secondary | ICD-10-CM | POA: Diagnosis not present

## 2023-06-11 NOTE — Telephone Encounter (Signed)
 No answer, left message to call if having any issues or concerns, B.Vale Mousseau RN

## 2023-06-12 LAB — SURGICAL PATHOLOGY

## 2023-07-02 ENCOUNTER — Ambulatory Visit (INDEPENDENT_AMBULATORY_CARE_PROVIDER_SITE_OTHER): Payer: Medicare Other

## 2023-07-02 VITALS — Ht 67.0 in | Wt 177.0 lb

## 2023-07-02 DIAGNOSIS — Z0000 Encounter for general adult medical examination without abnormal findings: Secondary | ICD-10-CM

## 2023-07-02 NOTE — Patient Instructions (Signed)
 Jerry Farrell , Thank you for taking time to come for your Medicare Wellness Visit. I appreciate your ongoing commitment to your health goals. Please review the following plan we discussed and let me know if I can assist you in the future.   Referrals/Orders/Follow-Ups/Clinician Recommendations: none  This is a list of the screening recommended for you and due dates:  Health Maintenance  Topic Date Due   COVID-19 Vaccine (7 - 2024-25 season) 03/07/2023   Medicare Annual Wellness Visit  07/01/2024   DTaP/Tdap/Td vaccine (4 - Td or Tdap) 08/16/2032   Pneumonia Vaccine  Completed   Flu Shot  Completed   Hepatitis C Screening  Completed   Zoster (Shingles) Vaccine  Completed   HPV Vaccine  Aged Out   Colon Cancer Screening  Discontinued    Advanced directives: (Declined) Advance directive discussed with you today. Even though you declined this today, please call our office should you change your mind, and we can give you the proper paperwork for you to fill out.  Next Medicare Annual Wellness Visit scheduled for next year: Yes 07/02/24 @ 9:30am televisit

## 2023-07-02 NOTE — Progress Notes (Signed)
 Subjective:   Jerry Farrell is a 78 y.o. who presents for a Medicare Wellness preventive visit.  Visit Complete: Virtual I connected with  Jerry Farrell on 07/02/23 by a audio enabled telemedicine application and verified that I am speaking with the correct person using two identifiers.  Patient Location: Home  Provider Location: Home Office  I discussed the limitations of evaluation and management by telemedicine. The patient expressed understanding and agreed to proceed.  Vital Signs: Because this visit was a virtual/telehealth visit, some criteria may be missing or patient reported. Any vitals not documented were not able to be obtained and vitals that have been documented are patient reported.  VideoDeclined- This patient declined Librarian, academic. Therefore the visit was completed with audio only.  AWV Questionnaire: No: Patient Medicare AWV questionnaire was not completed prior to this visit.  Cardiac Risk Factors include: advanced age (>50men, >78 women);male gender;hypertension;dyslipidemia     Objective:    Today's Vitals   07/02/23 0850  Weight: 177 lb (80.3 kg)  Height: 5\' 7"  (1.702 m)   Body mass index is 27.72 kg/m.     07/02/2023    9:00 AM 06/27/2022    9:23 AM 11/25/2021    7:44 AM 11/20/2021    4:39 PM 09/12/2021   10:56 AM 06/24/2021    2:00 PM 12/02/2020   10:30 PM  Advanced Directives  Does Patient Have a Medical Advance Directive? No No No No No No No  Would patient like information on creating a medical advance directive?  No - Patient declined No - Patient declined No - Patient declined No - Patient declined Yes (MAU/Ambulatory/Procedural Areas - Information given) No - Patient declined    Current Medications (verified) Outpatient Encounter Medications as of 07/02/2023  Medication Sig   acetaminophen (TYLENOL) 500 MG tablet Take 1,000 mg by mouth every 6 (six) hours as needed for mild pain or headache. (Patient not taking:  Reported on 05/29/2023)   aluminum hydroxide-magnesium carbonate (GAVISCON) 95-358 MG/15ML SUSP Take 15 mLs by mouth as needed for indigestion. (Patient not taking: Reported on 05/29/2023)   amLODipine (NORVASC) 5 MG tablet Take 1 tablet (5 mg total) by mouth daily.   aspirin EC 81 MG tablet Take 1 tablet (81 mg total) by mouth daily.   atorvastatin (LIPITOR) 40 MG tablet TAKE 1 TABLET BY MOUTH EVERY DAY   carvedilol (COREG) 6.25 MG tablet TAKE 1 TABLET BY MOUTH TWICE A DAY WITH FOOD   famotidine (PEPCID) 20 MG tablet TAKE 1 TABLET BY MOUTH TWICE A DAY   ferrous sulfate 325 (65 FE) MG EC tablet Take 1 tablet (325 mg total) by mouth daily with breakfast.   hydrALAZINE (APRESOLINE) 25 MG tablet TAKE 1 TABLET BY MOUTH TWICE A DAY   meclizine (ANTIVERT) 25 MG tablet Take 1 tablet (25 mg total) by mouth 3 (three) times daily as needed for dizziness.   pantoprazole (PROTONIX) 40 MG tablet Take 1 tablet (40 mg total) by mouth 2 (two) times daily. (Patient taking differently: Take 40 mg by mouth daily.)   SYSTANE BALANCE 0.6 % SOLN Place 1 drop into both eyes 3 (three) times daily as needed (for dryness).   tamsulosin (FLOMAX) 0.4 MG CAPS capsule TAKE 1 CAPSULES BY MOUTH ONCE DAILY.   valsartan (DIOVAN) 320 MG tablet TAKE 1 TABLET BY MOUTH EVERY DAY   No facility-administered encounter medications on file as of 07/02/2023.    Allergies (verified) Patient has no known allergies.  History: Past Medical History:  Diagnosis Date   Adenomatous colon polyp    BPH (benign prostatic hyperplasia)    CAD (coronary artery disease)    cardiolite ok 3/05, stress test- low risk study 11/06   Chronic low back pain    with radiculopathy   ED (erectile dysfunction)    Gallstones    abd [place]- stable hamangioma, gallbladder sludge and tiny stones 09/2003   Gastritis    GERD (gastroesophageal reflux disease)    H pylori ulcer    Hyperlipidemia    Hypertension    Transfusion history    Past Surgical History:   Procedure Laterality Date   CATARACT EXTRACTION W/PHACO  03/13/2012   Procedure: CATARACT EXTRACTION PHACO AND INTRAOCULAR LENS PLACEMENT (IOC);  Surgeon: Shade Flood, MD;  Location: [facility] OR;  Service: Ophthalmology;  Laterality: Left;   CORONARY ARTERY BYPASS GRAFT  1998   x 1  [facility] at [facility]   ENTEROSCOPY N/A 11/22/2021   Procedure: ENTEROSCOPY;  Surgeon: Jenel Lucks, MD;  Location: [facility];  Service: Gastroenterology;  Laterality: N/A;   ESOPHAGOGASTRODUODENOSCOPY (EGD) WITH PROPOFOL N/A 06/07/2016   Procedure: ESOPHAGOGASTRODUODENOSCOPY (EGD) WITH PROPOFOL;  Surgeon: Napoleon Form, MD;  Location: [facility] ENDOSCOPY;  Service: Endoscopy;  Laterality: N/A;   GASTRECTOMY     partial   HEMOSTASIS CLIP PLACEMENT  11/22/2021   Procedure: HEMOSTASIS CLIP PLACEMENT;  Surgeon: Jenel Lucks, MD;  Location: [facility];  Service: Gastroenterology;;   LEFT HEART CATH AND CORS/GRAFTS ANGIOGRAPHY N/A 12/02/2020   Procedure: LEFT HEART CATH AND CORS/GRAFTS ANGIOGRAPHY;  Surgeon: Runell Gess, MD;  Location: [facility] INVASIVE CV LAB;  Service: Cardiovascular;  Laterality: N/A;   MEMBRANE PEEL  03/13/2012   Procedure: MEMBRANE PEEL;  Surgeon: Shade Flood, MD;  Location: [facility] OR;  Service: Ophthalmology;  Laterality: Left;   PARS PLANA VITRECTOMY  03/13/2012   Procedure: PARS PLANA VITRECTOMY WITH 23 GAUGE;  Surgeon: Shade Flood, MD;  Location: [facility] OR;  Service: Ophthalmology;  Laterality: Left;   Family History  Problem Relation Age of Onset   Hypertension Mother    Throat cancer Brother        pt feels alcohol related   Colon cancer Brother        1/2 brother   Stomach cancer Neg Hx    Pancreatic cancer Neg Hx    Social History   Socioeconomic History   Marital status: Married    Spouse name: Not on file   Number of children: 3   Years of education: Not on file   Highest education level: Not on file  Occupational History   Occupation: [employer]    Employer: RETIRED   Tobacco Use   Smoking status: Never   Smokeless tobacco: Never  Vaping Use   Vaping status: Never Used  Substance and Sexual Activity   Alcohol use: No    Alcohol/week: 0.0 standard drinks of alcohol   Drug use: No   Sexual activity: Yes  Other Topics Concern   Not on file  Social History Narrative   Not on file   Social Drivers of Health   Financial Resource Strain: Low Risk  (07/02/2023)   Overall Financial Resource Strain (CARDIA)    Difficulty of Paying Living Expenses: Not hard at all  Food Insecurity: No Food Insecurity (07/02/2023)   Hunger Vital Sign    Worried About Running Out of Food in the Last Year: Never true    Ran Out of Food in the Last  Year: Never true  Transportation Needs: No Transportation Needs (07/02/2023)   PRAPARE - Administrator, Civil Service (Medical): No    Lack of Transportation (Non-Medical): No  Physical Activity: Insufficiently Active (07/02/2023)   Exercise Vital Sign    Days of Exercise per Week: 3 days    Minutes of Exercise per Session: 40 min  Stress: No Stress Concern Present (07/02/2023)   [employer] of Occupational Health - Occupational Stress Questionnaire    Feeling of Stress : Not at all  Social Connections: Moderately Integrated (07/02/2023)   Social Connection and Isolation Panel [NHANES]    Frequency of Communication with Friends and Family: More than three times a week    Frequency of Social Gatherings with Friends and Family: More than three times a week    Attends Religious Services: More than 4 times per year    Active Member of [employer] or Organizations: No    Attends Engineer, structural: Never    Marital Status: Married    Tobacco Counseling Counseling given: Not Answered    Clinical Intake:  Pre-visit preparation completed: Yes  Pain : No/denies pain    BMI - recorded: 27.72 Nutritional Status: BMI 25 -29 Overweight Nutritional Risks: None Diabetes: No  How often do you need to  have someone help you when you read instructions, pamphlets, or other written materials from your doctor or pharmacy?: 1 - Never  Interpreter Needed?: No  Comments: lives with wife Information entered by :: B.Rosibel Giacobbe,LPN   Activities of Daily Living     07/02/2023    9:01 AM  In your present state of health, do you have any difficulty performing the following activities:  Hearing? 1  Vision? 0  Difficulty concentrating or making decisions? 0  Walking or climbing stairs? 0  Dressing or bathing? 0  Doing errands, shopping? 0  Preparing Food and eating ? N  Using the Toilet? N  In the past six months, have you accidently leaked urine? N  Do you have problems with loss of bowel control? N  Managing your Medications? N  Managing your Finances? N  Housekeeping or managing your Housekeeping? N    Patient Care Team: Tower, Audrie Gallus, MD as PCP - General Allyson Sabal Delton See, MD as PCP - Cardiology (Cardiology) Runell Gess, MD as Consulting Physician (Cardiology) Gelene Mink, OD as Consulting Physician (Optometry) Jenel Lucks, MD as Consulting Physician (Gastroenterology) Kathyrn Sheriff, [facility] (Inactive) as Pharmacist (Pharmacist) Gelene Mink, OD as Referring Physician (Optometry)  Indicate any recent Medical Services you may have received from other than [facility] providers in the past year (date may be approximate).     Assessment:   This is a routine wellness examination for [facility].  Hearing/Vision screen Hearing Screening - Comments:: Pt says he has difficulty hearing: lost one hearing aid Vision Screening - Comments:: Pt says his vision is good w/glasses Dr Vickii Penna    Goals Addressed             This Visit's Progress    Patient Stated   On track    07/02/23-I will maintain and continue medications as prescribed.      Patient Stated       07/02/23-Would like to maintain current routine        Depression Screen     07/02/2023    8:57 AM 06/27/2022     9:22 AM 11/28/2021    3:07 PM 06/24/2021    2:03 PM 06/23/2020  2:59 PM 05/14/2019    2:02 PM 05/08/2018    8:59 AM  PHQ 2/9 Scores  PHQ - 2 Score 0 0 0 0 0 0 0  PHQ- 9 Score     0 0 0    Fall Risk     07/02/2023    8:53 AM 06/27/2022    9:15 AM 11/28/2021    3:07 PM 06/24/2021    2:01 PM 06/23/2020    2:59 PM  Fall Risk   Falls in the past year? 0 0 0 0 0  Number falls in past yr: 0 0  0 0  Injury with Fall? 0 0  0 0  Risk for fall due to : No Fall Risks No Fall Risks  No Fall Risks Medication side effect  Follow up Education provided;Falls prevention discussed Falls prevention discussed;Falls evaluation completed Falls evaluation completed Falls prevention discussed Falls evaluation completed;Falls prevention discussed    MEDICARE RISK AT HOME:  Medicare Risk at Home Any stairs in or around the home?: No If so, are there any without handrails?: No Home free of loose throw rugs in walkways, pet beds, electrical cords, etc?: Yes Adequate lighting in your home to reduce risk of falls?: Yes Life alert?: No Use of a cane, walker or w/c?: No Grab bars in the bathroom?: Yes Shower chair or bench in shower?: No Elevated toilet seat or a handicapped toilet?: Yes  TIMED UP AND GO:  Was the test performed?  No  Cognitive Function: 6CIT completed    06/23/2020    3:01 PM 05/14/2019    2:04 PM 05/08/2018    8:59 AM 01/26/2016    8:55 AM  MMSE - Mini Mental State Exam  Orientation to time 5 5 5 5   Orientation to Place 5 5 5 5   Registration 3 3 3 3   Attention/ Calculation 5 5 0 0  Recall 3 2 3 3   Language- name 2 objects   0 0  Language- repeat 1 1 1 1   Language- follow 3 step command   3 3  Language- read & follow direction   0 0  Write a sentence   0 0  Copy design   0 0  Total score   20 20        07/02/2023    9:02 AM 06/27/2022    9:25 AM  6CIT Screen  What Year? 0 points 0 points  What month? 0 points 0 points  What time? 0 points 0 points  Count back from 20 0 points 0  points  Months in reverse 2 points 0 points  Repeat phrase 2 points 2 points  Total Score 4 points 2 points    Immunizations Immunization History  Administered Date(s) Administered   Fluad Quad(high Dose 65+) 01/01/2019, 01/19/2020   Fluad Trivalent(High Dose 65+) 01/10/2023   Influenza Split 03/17/2011, 04/30/2012   Influenza, High Dose Seasonal PF 02/20/2018   Influenza,inj,Quad PF,6+ Mos 04/08/2013, 05/28/2015, 01/26/2016, 03/26/2017   Influenza-Unspecified 02/22/2014   PFIZER(Purple Top)SARS-COV-2 Vaccination 05/15/2019, 06/05/2019, 01/28/2020   Pfizer Covid-19 Vaccine Bivalent Booster 79yrs & up 02/08/2021   Pfizer(Comirnaty)Fall Seasonal Vaccine 12 years and older 02/13/2022, 01/10/2023   Pneumococcal Conjugate-13 10/28/2014   Pneumococcal Polysaccharide-23 04/30/2012   Td 08/22/1997, 11/26/2007   Tdap 08/17/2022   Zoster Recombinant(Shingrix) 10/12/2020, 02/28/2021   Zoster, Live 08/19/2013    Screening Tests Health Maintenance  Topic Date Due   COVID-19 Vaccine (7 - 2024-25 season) 03/07/2023   Medicare Annual Wellness (AWV)  07/01/2024   DTaP/Tdap/Td (4 - Td or Tdap) 08/16/2032   Pneumonia Vaccine 25+ Years old  Completed   INFLUENZA VACCINE  Completed   Hepatitis C Screening  Completed   Zoster Vaccines- Shingrix  Completed   HPV VACCINES  Aged Out   Colonoscopy  Discontinued    Health Maintenance  Health Maintenance Due  Topic Date Due   COVID-19 Vaccine (7 - 2024-25 season) 03/07/2023   Health Maintenance Items Addressed:all/none needed   Additional Screening:  Vision Screening: Recommended annual ophthalmology exams for early detection of glaucoma and other disorders of the eye.  Dental Screening: Recommended annual dental exams for proper oral hygiene  Community Resource Referral / Chronic Care Management: CRR required this visit?  No   CCM required this visit?  Appt scheduled with PCP fpr CPE and Lab visit    Plan:     I have personally  reviewed and noted the following in the patient's chart:   Medical and social history Use of alcohol, tobacco or illicit drugs  Current medications and supplements including opioid prescriptions. Patient is not currently taking opioid prescriptions. Functional ability and status Nutritional status Physical activity Advanced directives List of other physicians Hospitalizations, surgeries, and ER visits in previous 12 months Vitals Screenings to include cognitive, depression, and falls Referrals and appointments  In addition, I have reviewed and discussed with patient certain preventive protocols, quality metrics, and best practice recommendations. A written personalized care plan for preventive services as well as general preventive health recommendations were provided to patient.     Sue Lush, LPN   1/61/0960   After Visit Summary: (MyChart) Due to this being a telephonic visit, the after visit summary with patients personalized plan was offered to patient via MyChart   Notes: Nothing significant to report at this time.

## 2023-07-06 ENCOUNTER — Other Ambulatory Visit: Payer: Self-pay | Admitting: Cardiovascular Disease

## 2023-07-14 ENCOUNTER — Other Ambulatory Visit: Payer: Self-pay | Admitting: Family Medicine

## 2023-07-16 DIAGNOSIS — R310 Gross hematuria: Secondary | ICD-10-CM | POA: Diagnosis not present

## 2023-07-16 DIAGNOSIS — R3912 Poor urinary stream: Secondary | ICD-10-CM | POA: Diagnosis not present

## 2023-07-30 ENCOUNTER — Encounter: Payer: Self-pay | Admitting: Gastroenterology

## 2023-08-12 ENCOUNTER — Telehealth: Payer: Self-pay | Admitting: Family Medicine

## 2023-08-12 DIAGNOSIS — E538 Deficiency of other specified B group vitamins: Secondary | ICD-10-CM

## 2023-08-12 DIAGNOSIS — N400 Enlarged prostate without lower urinary tract symptoms: Secondary | ICD-10-CM

## 2023-08-12 DIAGNOSIS — R7303 Prediabetes: Secondary | ICD-10-CM

## 2023-08-12 DIAGNOSIS — D696 Thrombocytopenia, unspecified: Secondary | ICD-10-CM

## 2023-08-12 DIAGNOSIS — D509 Iron deficiency anemia, unspecified: Secondary | ICD-10-CM

## 2023-08-12 DIAGNOSIS — Z125 Encounter for screening for malignant neoplasm of prostate: Secondary | ICD-10-CM

## 2023-08-12 DIAGNOSIS — I10 Essential (primary) hypertension: Secondary | ICD-10-CM

## 2023-08-12 NOTE — Telephone Encounter (Signed)
-----   Message from Gerry Krone sent at 07/30/2023  2:47 PM EDT ----- Regarding: Lab orders for Tue, 4.22.25 Patient is scheduled for CPX labs, please order future labs, Thanks , Anselmo Kings

## 2023-08-14 ENCOUNTER — Other Ambulatory Visit (INDEPENDENT_AMBULATORY_CARE_PROVIDER_SITE_OTHER)

## 2023-08-14 DIAGNOSIS — E538 Deficiency of other specified B group vitamins: Secondary | ICD-10-CM

## 2023-08-14 DIAGNOSIS — N400 Enlarged prostate without lower urinary tract symptoms: Secondary | ICD-10-CM | POA: Diagnosis not present

## 2023-08-14 DIAGNOSIS — I10 Essential (primary) hypertension: Secondary | ICD-10-CM | POA: Diagnosis not present

## 2023-08-14 DIAGNOSIS — D696 Thrombocytopenia, unspecified: Secondary | ICD-10-CM

## 2023-08-14 DIAGNOSIS — D509 Iron deficiency anemia, unspecified: Secondary | ICD-10-CM

## 2023-08-14 DIAGNOSIS — R7303 Prediabetes: Secondary | ICD-10-CM

## 2023-08-14 DIAGNOSIS — Z125 Encounter for screening for malignant neoplasm of prostate: Secondary | ICD-10-CM | POA: Diagnosis not present

## 2023-08-14 LAB — VITAMIN B12: Vitamin B-12: 319 pg/mL (ref 211–911)

## 2023-08-14 LAB — COMPREHENSIVE METABOLIC PANEL WITH GFR
ALT: 24 U/L (ref 0–53)
AST: 21 U/L (ref 0–37)
Albumin: 4.3 g/dL (ref 3.5–5.2)
Alkaline Phosphatase: 116 U/L (ref 39–117)
BUN: 18 mg/dL (ref 6–23)
CO2: 28 meq/L (ref 19–32)
Calcium: 9.1 mg/dL (ref 8.4–10.5)
Chloride: 104 meq/L (ref 96–112)
Creatinine, Ser: 0.92 mg/dL (ref 0.40–1.50)
GFR: 78.79 mL/min (ref >60.00)
Glucose, Bld: 96 mg/dL (ref 70–99)
Potassium: 4 meq/L (ref 3.5–5.1)
Sodium: 139 meq/L (ref 135–145)
Total Bilirubin: 1.2 mg/dL (ref 0.2–1.2)
Total Protein: 6.7 g/dL (ref 6.0–8.3)

## 2023-08-14 LAB — CBC WITH DIFFERENTIAL/PLATELET
Basophils Absolute: 0.1 10*3/uL (ref 0.0–0.1)
Basophils Relative: 0.6 % (ref 0.0–3.0)
Eosinophils Absolute: 0.1 10*3/uL (ref 0.0–0.7)
Eosinophils Relative: 0.9 % (ref 0.0–5.0)
HCT: 46.7 % (ref 39.0–52.0)
Hemoglobin: 15.5 g/dL (ref 13.0–17.0)
Lymphocytes Relative: 13.5 % (ref 12.0–46.0)
Lymphs Abs: 1.1 10*3/uL (ref 0.7–4.0)
MCHC: 33.2 g/dL (ref 30.0–36.0)
MCV: 89.8 fl (ref 78.0–100.0)
Monocytes Absolute: 0.7 10*3/uL (ref 0.1–1.0)
Monocytes Relative: 8 % (ref 3.0–12.0)
Neutro Abs: 6.4 10*3/uL (ref 1.4–7.7)
Neutrophils Relative %: 77 % (ref 43.0–77.0)
Platelets: 146 10*3/uL — ABNORMAL LOW (ref 150.0–400.0)
RBC: 5.2 Mil/uL (ref 4.22–5.81)
RDW: 13.6 % (ref 11.5–15.5)
WBC: 8.3 10*3/uL (ref 4.0–10.5)

## 2023-08-14 LAB — PSA, MEDICARE: PSA: 2.25 ng/mL (ref 0.10–4.00)

## 2023-08-14 LAB — LIPID PANEL
Cholesterol: 117 mg/dL (ref 0–200)
HDL: 41.5 mg/dL (ref >39.00)
LDL Cholesterol: 48 mg/dL (ref 0–99)
NonHDL: 75.45
Total CHOL/HDL Ratio: 3
Triglycerides: 136 mg/dL (ref 0.0–149.0)
VLDL: 27.2 mg/dL (ref 0.0–40.0)

## 2023-08-14 LAB — TSH: TSH: 0.42 u[IU]/mL (ref 0.35–5.50)

## 2023-08-14 LAB — IRON: Iron: 88 ug/dL (ref 42–165)

## 2023-08-14 LAB — HEMOGLOBIN A1C: Hgb A1c MFr Bld: 5.7 % (ref 4.6–6.5)

## 2023-08-14 LAB — FERRITIN: Ferritin: 64.2 ng/mL (ref 22.0–322.0)

## 2023-08-21 ENCOUNTER — Ambulatory Visit (INDEPENDENT_AMBULATORY_CARE_PROVIDER_SITE_OTHER): Admitting: Family Medicine

## 2023-08-21 ENCOUNTER — Encounter: Payer: Self-pay | Admitting: Family Medicine

## 2023-08-21 VITALS — BP 128/76 | HR 65 | Temp 98.1°F | Ht 67.5 in | Wt 181.0 lb

## 2023-08-21 DIAGNOSIS — I422 Other hypertrophic cardiomyopathy: Secondary | ICD-10-CM | POA: Diagnosis not present

## 2023-08-21 DIAGNOSIS — I10 Essential (primary) hypertension: Secondary | ICD-10-CM

## 2023-08-21 DIAGNOSIS — I25118 Atherosclerotic heart disease of native coronary artery with other forms of angina pectoris: Secondary | ICD-10-CM

## 2023-08-21 DIAGNOSIS — D509 Iron deficiency anemia, unspecified: Secondary | ICD-10-CM

## 2023-08-21 DIAGNOSIS — D696 Thrombocytopenia, unspecified: Secondary | ICD-10-CM

## 2023-08-21 DIAGNOSIS — E538 Deficiency of other specified B group vitamins: Secondary | ICD-10-CM | POA: Diagnosis not present

## 2023-08-21 DIAGNOSIS — K2100 Gastro-esophageal reflux disease with esophagitis, without bleeding: Secondary | ICD-10-CM | POA: Diagnosis not present

## 2023-08-21 DIAGNOSIS — I739 Peripheral vascular disease, unspecified: Secondary | ICD-10-CM | POA: Diagnosis not present

## 2023-08-21 DIAGNOSIS — E785 Hyperlipidemia, unspecified: Secondary | ICD-10-CM

## 2023-08-21 DIAGNOSIS — R7303 Prediabetes: Secondary | ICD-10-CM | POA: Diagnosis not present

## 2023-08-21 DIAGNOSIS — Z79899 Other long term (current) drug therapy: Secondary | ICD-10-CM | POA: Insufficient documentation

## 2023-08-21 DIAGNOSIS — Z0000 Encounter for general adult medical examination without abnormal findings: Secondary | ICD-10-CM

## 2023-08-21 DIAGNOSIS — N400 Enlarged prostate without lower urinary tract symptoms: Secondary | ICD-10-CM

## 2023-08-21 DIAGNOSIS — Z125 Encounter for screening for malignant neoplasm of prostate: Secondary | ICD-10-CM

## 2023-08-21 NOTE — Assessment & Plan Note (Signed)
 Continues beta blocker and statin  No clinical changes Sees cardiology

## 2023-08-21 NOTE — Assessment & Plan Note (Signed)
 bp in fair control at this time  BP Readings from Last 1 Encounters:  08/21/23 128/76   No changes needed Most recent labs reviewed  Disc lifstyle change with low sodium diet and exercise  Carvedilol  6.25 mg bid  Amlodipine  5 mg daily Hydralazine  25 mg bid  Valsartan   320 mg daily

## 2023-08-21 NOTE — Assessment & Plan Note (Signed)
 Disc goals for lipids and reasons to control them Rev last labs with pt Rev low sat fat diet in detail Well controlled with atorvastatin  40 mg daily  In setting of CAD and PAD

## 2023-08-21 NOTE — Assessment & Plan Note (Signed)
 Med list includes Protonix  40 mg daily  Famotidine  20 mg bid-he is unsure if he takes this No flares or clinical changes Under care of GI  Gastrectomy and PUD in past

## 2023-08-21 NOTE — Assessment & Plan Note (Signed)
 Needs return visit to urology  More frequency /nocturia and sometimes wakes up with back pain if bladder is full He plans to schedule follow up  Continues flomax  0.4 mg daily

## 2023-08-21 NOTE — Assessment & Plan Note (Signed)
 From past GI bleed/ pud  Taking iron every other day now and feeling good  Hb stable  Lab Results  Component Value Date   WBC 8.3 08/14/2023   HGB 15.5 08/14/2023   HCT 46.7 08/14/2023   MCV 89.8 08/14/2023   PLT 146.0 (L) 08/14/2023

## 2023-08-21 NOTE — Assessment & Plan Note (Signed)
 Continues cardiology care  On carvedilol   No clinical changes

## 2023-08-21 NOTE — Patient Instructions (Addendum)
 You need to get back to the urologist for the new urinary symptoms  Please call for an appointment  If you need a referral please let us  know    Start taking vitamin D3 for bone health 2000 international units daily  Over the counter   Keep walking  Add some strength training to your routine, this is important for bone and brain health and can reduce your risk of falls and help your body use insulin properly and regulate weight  Light weights, exercise bands , and internet videos are a good way to start  Yoga (chair or regular), machines , floor exercises or a gym with machines are also good options    To prevent diabetes Try to get most of your carbohydrates from produce (with the exception of white potatoes) and whole grains Eat less bread/pasta/rice/snack foods/cereals/sweets and other items from the middle of the grocery store (processed carbs)  I think you should make an appointment to get new hearing aides  This would improve your quality of life significantly   Let us  know if you are still taking famotidine  (pepcid )- if so I will refill it

## 2023-08-21 NOTE — Assessment & Plan Note (Signed)
 No clinical changes Under vascular care  81 mg asa daily  Blood pressure control Statin  Able to palp pedal pulses today

## 2023-08-21 NOTE — Assessment & Plan Note (Signed)
 Oral supplementation Lab Results  Component Value Date   VITAMINB12 319 08/14/2023   Reassuring

## 2023-08-21 NOTE — Assessment & Plan Note (Signed)
 No symptoms  Plt ct 146 Doing well  Iron def is in good control

## 2023-08-21 NOTE — Assessment & Plan Note (Signed)
 Lab Results  Component Value Date   HGBA1C 5.7 08/14/2023   HGBA1C 5.6 08/10/2022   HGBA1C 5.7 09/30/2020   disc imp of low glycemic diet and wt loss to prevent DM2

## 2023-08-21 NOTE — Progress Notes (Signed)
 Subjective:    Patient ID: Jerry Farrell, male    DOB: 1945/05/16, 78 y.o.   MRN: [id number]  HPI  Here for health maintenance exam and to review chronic medical problems   Wt Readings from Last 3 Encounters:  08/21/23 181 lb (82.1 kg)  07/02/23 177 lb (80.3 kg)  06/08/23 185 lb (83.9 kg)   27.93 kg/m  Vitals:   08/21/23 0828  BP: 128/76  Pulse: 65  Temp: 98.1 F (36.7 C)  SpO2: 98%    Immunization History  Administered Date(s) Administered   Fluad Quad(high Dose 65+) 01/01/2019, 01/19/2020   Fluad Trivalent(High Dose 65+) 01/10/2023   Influenza Split 03/17/2011, 04/30/2012   Influenza, High Dose Seasonal PF 02/20/2018   Influenza,inj,Quad PF,6+ Mos 04/08/2013, 05/28/2015, 01/26/2016, 03/26/2017   Influenza-Unspecified 02/22/2014   PFIZER(Purple Top)SARS-COV-2 Vaccination 05/15/2019, 06/05/2019, 01/28/2020   Pfizer Covid-19 Vaccine Bivalent Booster 43yrs & up 02/08/2021   Pfizer(Comirnaty )Fall Seasonal Vaccine 12 years and older 02/13/2022, 01/10/2023   Pneumococcal Conjugate-13 10/28/2014   Pneumococcal Polysaccharide-23 04/30/2012   Td 08/22/1997, 11/26/2007   Tdap 08/17/2022   Zoster Recombinant(Shingrix) 10/12/2020, 02/28/2021   Zoster, Live 08/19/2013    There are no preventive care reminders to display for this patient.  Feels good overall  Has sleep problems  Gets up several times to urinate - gives him side pain   Prostate health Lab Results  Component Value Date   PSA 2.25 08/14/2023   PSA 1.66 08/10/2022   PSA 3.19 05/08/2018  Takes tamsulosin  for BPH Saw urology in past   Colon cancer screening -colonoscopy 05/2023 with no recall Half brother had colon cancer  Had a tubular adenoma  GI cut is iron to every other day  Bone health   Falls-none  Fractures-none  Supplements none but iron    Exercise :  Walking every day   Had to stop bowling-it made him dizzy and made his chest feel funny Had a work up for this from cardiology  -including cardiac mri    Mood    07/02/2023    8:57 AM 06/27/2022    9:22 AM 11/28/2021    3:07 PM 06/24/2021    2:03 PM 06/23/2020    2:59 PM  Depression screen PHQ 2/9  Decreased Interest 0 0 0 0 0  Down, Depressed, Hopeless 0 0 0 0 0  PHQ - 2 Score 0 0 0 0 0  Altered sleeping     0  Tired, decreased energy     0  Change in appetite     0  Feeling bad or failure about yourself      0  Trouble concentrating     0  Moving slowly or fidgety/restless     0  Suicidal thoughts     0  PHQ-9 Score     0  Difficult doing work/chores     Not difficult at all   HTN in setting of CAD and hypertrophic cardiomyopathy/under care of cardiology  Also PAD bp is stable today  No cp or palpitations or headaches or edema  No side effects to medicines  BP Readings from Last 3 Encounters:  08/21/23 128/76  06/08/23 136/72  05/29/23 124/70    Amlodipine  Carvedilol  Valsartan  Hydralazine   Pulse Readings from Last 3 Encounters:  08/21/23 65  06/08/23 60  05/29/23 66   Lab Results  Component Value Date   NA 139 08/14/2023   K 4.0 08/14/2023   CO2 28 08/14/2023   GLUCOSE 96 08/14/2023  BUN 18 08/14/2023   CREATININE 0.92 08/14/2023   CALCIUM  9.1 08/14/2023   GFR 78.79 08/14/2023   EGFR 72 12/01/2020   GFRNONAA >60 11/25/2021   Lab Results  Component Value Date   ALT 24 08/14/2023   AST 21 08/14/2023   ALKPHOS 116 08/14/2023   BILITOT 1.2 08/14/2023   Lab Results  Component Value Date   TSH 0.42 08/14/2023     GERD with past PUD/ bleeding and past gastrectomy  Pepcid  20 mg bid  Protonix  40 mg daily   Prediabetes Lab Results  Component Value Date   HGBA1C 5.7 08/14/2023   HGBA1C 5.6 08/10/2022   HGBA1C 5.7 09/30/2020   Stable  Diet - makes effort to avoid sugar Uses splenda in coffee  Rarely eats sweets    History of low plt and iron with past PUD  Lab Results  Component Value Date   WBC 8.3 08/14/2023   HGB 15.5 08/14/2023   HCT 46.7 08/14/2023   MCV 89.8  08/14/2023   PLT 146.0 (L) 08/14/2023    History of low B12 Lab Results  Component Value Date   VITAMINB12 319 08/14/2023   Hyperlipidemia Lab Results  Component Value Date   CHOL 117 08/14/2023   CHOL 108 08/10/2022   CHOL 110 09/26/2021   Lab Results  Component Value Date   HDL 41.50 08/14/2023   HDL 47.30 08/10/2022   HDL 58 09/26/2021   Lab Results  Component Value Date   LDLCALC 48 08/14/2023   LDLCALC 40 08/10/2022   LDLCALC 37 09/26/2021   Lab Results  Component Value Date   TRIG 136.0 08/14/2023   TRIG 104.0 08/10/2022   TRIG 70 09/26/2021   Lab Results  Component Value Date   CHOLHDL 3 08/14/2023   CHOLHDL 2 08/10/2022   CHOLHDL 1.9 09/26/2021   Lab Results  Component Value Date   LDLDIRECT 80.0 05/08/2018   Atorvastatin  40 mg daily  Well controlled    Patient Active Problem List   Diagnosis Date Noted   PAD (peripheral artery disease) (HCC) 08/17/2022   Polypharmacy 08/17/2022   PVC's (premature ventricular contractions) 12/21/2021   Hypertrophic cardiomyopathy (HCC) 11/28/2021   History of GI bleed 11/21/2021   CAD in native artery 12/02/2020   Chest pain of uncertain etiology    Thrombocytopenia (HCC) 10/07/2020   Pedal edema 03/28/2020   Gallstones 06/26/2019   Coronary artery disease 05/20/2019   Dizziness 08/26/2018   Peptic ulcer disease 06/14/2016   Iron deficiency anemia 06/14/2016   B12 deficiency 06/14/2016   Prediabetes 01/31/2016   GERD (gastroesophageal reflux disease) 07/20/2015   Routine general medical examination at a health care facility 10/28/2014   Elevated alkaline phosphatase level 10/28/2014   BPH (benign prostatic hyperplasia) 04/29/2014   Prostate cancer screening 08/11/2013   Dyslipidemia, goal LDL below 70 01/09/2007   ERECTILE DYSFUNCTION 01/09/2007   Essential hypertension 01/09/2007   S/P CABG (coronary artery bypass graft) 01/09/2007   ESOPHAGITIS 01/09/2007   Past Medical History:  Diagnosis Date    Adenomatous colon polyp    BPH (benign prostatic hyperplasia)    CAD (coronary artery disease)    cardiolite ok 3/05, stress test- low risk study 11/06   Chronic low back pain    with radiculopathy   ED (erectile dysfunction)    Gallstones    abd US - stable hamangioma, gallbladder sludge and tiny stones 09/2003   Gastritis    GERD (gastroesophageal reflux disease)    H pylori ulcer  Hyperlipidemia    Hypertension    Transfusion history    Past Surgical History:  Procedure Laterality Date   CATARACT EXTRACTION W/PHACO  03/13/2012   Procedure: CATARACT EXTRACTION PHACO AND INTRAOCULAR LENS PLACEMENT (IOC);  Surgeon: Jewel Mortimer, MD;  Location: [facility] OR;  Service: Ophthalmology;  Laterality: Left;   CORONARY ARTERY BYPASS GRAFT  1998   x 1  done here at [facility]   ENTEROSCOPY N/A 11/22/2021   Procedure: ENTEROSCOPY;  Surgeon: Elois Hair, MD;  Location: [facility];  Service: Gastroenterology;  Laterality: N/A;   ESOPHAGOGASTRODUODENOSCOPY (EGD) WITH PROPOFOL  N/A 06/07/2016   Procedure: ESOPHAGOGASTRODUODENOSCOPY (EGD) WITH PROPOFOL ;  Surgeon: Sergio Dandy, MD;  Location: [facility];  Service: Endoscopy;  Laterality: N/A;   GASTRECTOMY     partial   HEMOSTASIS CLIP PLACEMENT  11/22/2021   Procedure: HEMOSTASIS CLIP PLACEMENT;  Surgeon: Elois Hair, MD;  Location: [facility];  Service: Gastroenterology;;   LEFT HEART CATH AND CORS/GRAFTS ANGIOGRAPHY N/A 12/02/2020   Procedure: LEFT HEART CATH AND CORS/GRAFTS ANGIOGRAPHY;  Surgeon: Avanell Leigh, MD;  Location: [facility];  Service: Cardiovascular;  Laterality: N/A;   MEMBRANE PEEL  03/13/2012   Procedure: MEMBRANE PEEL;  Surgeon: Jewel Mortimer, MD;  Location: [facility] OR;  Service: Ophthalmology;  Laterality: Left;   PARS PLANA VITRECTOMY  03/13/2012   Procedure: PARS PLANA VITRECTOMY WITH 23 GAUGE;  Surgeon: Jewel Mortimer, MD;  Location: [facility] OR;  Service: Ophthalmology;  Laterality: Left;   Social History    Tobacco Use   Smoking status: Never   Smokeless tobacco: Never  Vaping Use   Vaping status: Never Used  Substance Use Topics   Alcohol use: No    Alcohol/week: 0.0 standard drinks of alcohol   Drug use: No   Family History  Problem Relation Age of Onset   Hypertension Mother    Throat cancer Brother        pt feels alcohol related   Colon cancer Brother        1/2 brother   Stomach cancer Neg Hx    Pancreatic cancer Neg Hx    No Known Allergies Current Outpatient Medications on File Prior to Visit  Medication Sig Dispense Refill   acetaminophen  (TYLENOL ) 500 MG tablet Take 1,000 mg by mouth every 6 (six) hours as needed for mild pain (pain score 1-3) or headache.     aluminum  hydroxide-magnesium  carbonate (GAVISCON) 95-358 MG/15ML SUSP Take 15 mLs by mouth as needed for indigestion.     aspirin  EC 81 MG tablet Take 1 tablet (81 mg total) by mouth daily. 90 tablet 3   atorvastatin  (LIPITOR ) 40 MG tablet TAKE 1 TABLET BY MOUTH EVERY DAY 90 tablet 3   carvedilol  (COREG ) 6.25 MG tablet TAKE 1 TABLET BY MOUTH TWICE A DAY WITH FOOD 180 tablet 2   famotidine  (PEPCID ) 20 MG tablet TAKE 1 TABLET BY MOUTH TWICE A DAY 180 tablet 1   hydrALAZINE  (APRESOLINE ) 25 MG tablet TAKE 1 TABLET BY MOUTH TWICE A DAY 180 tablet 3   meclizine  (ANTIVERT ) 25 MG tablet Take 1 tablet (25 mg total) by mouth 3 (three) times daily as needed for dizziness. 30 tablet 0   SYSTANE BALANCE 0.6 % SOLN Place 1 drop into both eyes 3 (three) times daily as needed (for dryness).     tamsulosin  (FLOMAX ) 0.4 MG CAPS capsule TAKE 1 CAPSULES BY MOUTH ONCE DAILY. 90 capsule 0   valsartan  (DIOVAN ) 320 MG tablet TAKE 1 TABLET BY  MOUTH EVERY DAY 90 tablet 1   amLODipine  (NORVASC ) 5 MG tablet Take 1 tablet (5 mg total) by mouth daily. 90 tablet 3   ferrous sulfate  325 (65 FE) MG EC tablet Take 1 tablet (325 mg total) by mouth daily with breakfast. (Patient taking differently: Take 325 mg by mouth every other day.)      pantoprazole  (PROTONIX ) 40 MG tablet Take 1 tablet (40 mg total) by mouth 2 (two) times daily. (Patient taking differently: Take 40 mg by mouth daily.) 112 tablet 0   No current facility-administered medications on file prior to visit.    Review of Systems  Constitutional:  Negative for activity change, appetite change, fatigue, fever and unexpected weight change.  HENT:  Negative for congestion, rhinorrhea, sore throat and trouble swallowing.   Eyes:  Negative for pain, redness, itching and visual disturbance.  Respiratory:  Negative for cough, chest tightness, shortness of breath and wheezing.   Cardiovascular:  Negative for chest pain and palpitations.  Gastrointestinal:  Negative for abdominal pain, blood in stool, constipation, diarrhea and nausea.  Endocrine: Negative for cold intolerance, heat intolerance, polydipsia and polyuria.  Genitourinary:  Negative for difficulty urinating, dysuria, frequency and urgency.  Musculoskeletal:  Positive for back pain. Negative for arthralgias, joint swelling and myalgias.  Skin:  Negative for pallor and rash.  Neurological:  Negative for dizziness, tremors, weakness, numbness and headaches.  Hematological:  Negative for adenopathy. Does not bruise/bleed easily.  Psychiatric/Behavioral:  Negative for decreased concentration and dysphoric mood. The patient is not nervous/anxious.        Objective:   Physical Exam Constitutional:      General: He is not in acute distress.    Appearance: Normal appearance. He is well-developed and normal weight. He is not ill-appearing or diaphoretic.  HENT:     Head: Normocephalic and atraumatic.     Right Ear: Tympanic membrane, ear canal and external ear normal.     Left Ear: Tympanic membrane, ear canal and external ear normal.     Nose: Nose normal. No congestion.     Mouth/Throat:     Mouth: Mucous membranes are moist.     Pharynx: Oropharynx is clear. No posterior oropharyngeal erythema.  Eyes:      General: No scleral icterus.       Right eye: No discharge.        Left eye: No discharge.     Conjunctiva/sclera: Conjunctivae normal.     Pupils: Pupils are equal, round, and reactive to light.  Neck:     Thyroid : No thyromegaly.     Vascular: No carotid bruit or JVD.  Cardiovascular:     Rate and Rhythm: Normal rate and regular rhythm.     Pulses: Normal pulses.     Heart sounds: Normal heart sounds.     No gallop.  Pulmonary:     Effort: Pulmonary effort is normal. No respiratory distress.     Breath sounds: Normal breath sounds. No wheezing or rales.     Comments: Good air exch Chest:     Chest wall: No tenderness.  Abdominal:     General: Bowel sounds are normal. There is no distension or abdominal bruit.     Palpations: Abdomen is soft. There is no mass.     Tenderness: There is no abdominal tenderness.     Hernia: No hernia is present.  Musculoskeletal:        General: No tenderness.     Cervical back: Normal range  of motion and neck supple. No rigidity. No muscular tenderness.     Right lower leg: No edema.     Left lower leg: No edema.  Lymphadenopathy:     Cervical: No cervical adenopathy.  Skin:    General: Skin is warm and dry.     Coloration: Skin is not pale.     Findings: No erythema or rash.  Neurological:     Mental Status: He is alert.     Cranial Nerves: No cranial nerve deficit.     Motor: No abnormal muscle tone.     Coordination: Coordination normal.     Gait: Gait normal.     Deep Tendon Reflexes: Reflexes are normal and symmetric. Reflexes normal.  Psychiatric:        Mood and Affect: Mood normal.        Cognition and Memory: Cognition and memory normal.           Assessment & Plan:   Problem List Items Addressed This Visit       Cardiovascular and Mediastinum   PAD (peripheral artery disease) (HCC)   No clinical changes Under vascular care  81 mg asa daily  Blood pressure control Statin  Able to palp pedal pulses today       Hypertrophic cardiomyopathy (HCC)   Continues cardiology care  On carvedilol   No clinical changes       Essential hypertension   bp in fair control at this time  BP Readings from Last 1 Encounters:  08/21/23 128/76   No changes needed Most recent labs reviewed  Disc lifstyle change with low sodium diet and exercise  Carvedilol  6.25 mg bid  Amlodipine  5 mg daily Hydralazine  25 mg bid  Valsartan   320 mg daily       Coronary artery disease   Continues beta blocker and statin  No clinical changes Sees cardiology        Digestive   GERD (gastroesophageal reflux disease)   Med list includes Protonix  40 mg daily  Famotidine  20 mg bid-he is unsure if he takes this No flares or clinical changes Under care of GI  Gastrectomy and PUD in past         Genitourinary   BPH (benign prostatic hyperplasia)   Needs return visit to urology  More frequency /nocturia and sometimes wakes up with back pain if bladder is full He plans to schedule follow up  Continues flomax  0.4 mg daily         Hematopoietic and Hemostatic   Thrombocytopenia (HCC)   No symptoms  Plt ct 146 Doing well  Iron def is in good control         Other   Routine general medical examination at a health care facility - Primary   Reviewed health habits including diet and exercise and skin cancer prevention Reviewed appropriate screening tests for age  Also reviewed health mt list, fam hx and immunization status , as well as social and family history   See HPI Labs reviewed and ordered Health Maintenance  Topic Date Due   COVID-19 Vaccine (7 - Pfizer risk 2024-25 season) 09/05/2025*   Flu Shot  11/23/2023   Medicare Annual Wellness Visit  08/20/2024   DTaP/Tdap/Td vaccine (4 - Td or Tdap) 08/16/2032   Pneumonia Vaccine  Completed   Zoster (Shingles) Vaccine  Completed   HPV Vaccine  Aged Out   Meningitis B Vaccine  Aged Out   Colon Cancer Screening  Discontinued  Hepatitis C Screening   Discontinued  *Topic was postponed. The date shown is not the original due date.    Strongly encouraged to get hearing aides (last ones were lost) Colonoscopy utd  Needs urology follow up for nocturia  Discussed fall prevention, supplements and exercise for bone density  PHQ 0       Prostate cancer screening   Lab Results  Component Value Date   PSA 2.25 08/14/2023   PSA 1.66 08/10/2022   PSA 3.19 05/08/2018    Planning follow up with urology for nocturia  On flomax  0.4 mg daily      Prediabetes   Lab Results  Component Value Date   HGBA1C 5.7 08/14/2023   HGBA1C 5.6 08/10/2022   HGBA1C 5.7 09/30/2020   disc imp of low glycemic diet and wt loss to prevent DM2       Iron deficiency anemia   From past GI bleed/ pud  Taking iron every other day now and feeling good  Hb stable  Lab Results  Component Value Date   WBC 8.3 08/14/2023   HGB 15.5 08/14/2023   HCT 46.7 08/14/2023   MCV 89.8 08/14/2023   PLT 146.0 (L) 08/14/2023         Dyslipidemia, goal LDL below 70   Disc goals for lipids and reasons to control them Rev last labs with pt Rev low sat fat diet in detail Well controlled with atorvastatin  40 mg daily  In setting of CAD and PAD      B12 deficiency   Oral supplementation Lab Results  Component Value Date   VITAMINB12 319 08/14/2023   Reassuring

## 2023-08-21 NOTE — Assessment & Plan Note (Signed)
 Lab Results  Component Value Date   PSA 2.25 08/14/2023   PSA 1.66 08/10/2022   PSA 3.19 05/08/2018    Planning follow up with urology for nocturia  On flomax  0.4 mg daily

## 2023-08-21 NOTE — Assessment & Plan Note (Signed)
 Reviewed health habits including diet and exercise and skin cancer prevention Reviewed appropriate screening tests for age  Also reviewed health mt list, fam hx and immunization status , as well as social and family history   See HPI Labs reviewed and ordered Health Maintenance  Topic Date Due   COVID-19 Vaccine (7 - Pfizer risk 2024-25 season) 09/05/2025*   Flu Shot  11/23/2023   Medicare Annual Wellness Visit  08/20/2024   DTaP/Tdap/Td vaccine (4 - Td or Tdap) 08/16/2032   Pneumonia Vaccine  Completed   Zoster (Shingles) Vaccine  Completed   HPV Vaccine  Aged Out   Meningitis B Vaccine  Aged Out   Colon Cancer Screening  Discontinued   Hepatitis C Screening  Discontinued  *Topic was postponed. The date shown is not the original due date.    Strongly encouraged to get hearing aides (last ones were lost) Colonoscopy utd  Needs urology follow up for nocturia  Discussed fall prevention, supplements and exercise for bone density  PHQ 0

## 2023-10-10 DIAGNOSIS — H905 Unspecified sensorineural hearing loss: Secondary | ICD-10-CM | POA: Diagnosis not present

## 2023-10-12 ENCOUNTER — Other Ambulatory Visit: Payer: Self-pay | Admitting: Family Medicine

## 2023-10-19 ENCOUNTER — Other Ambulatory Visit: Payer: Self-pay | Admitting: Cardiovascular Disease

## 2023-10-25 DIAGNOSIS — N130 Hydronephrosis with ureteropelvic junction obstruction: Secondary | ICD-10-CM | POA: Diagnosis not present

## 2023-10-25 DIAGNOSIS — R1084 Generalized abdominal pain: Secondary | ICD-10-CM | POA: Diagnosis not present

## 2023-11-06 DIAGNOSIS — H35352 Cystoid macular degeneration, left eye: Secondary | ICD-10-CM | POA: Diagnosis not present

## 2023-11-06 DIAGNOSIS — H52223 Regular astigmatism, bilateral: Secondary | ICD-10-CM | POA: Diagnosis not present

## 2023-11-06 DIAGNOSIS — H5202 Hypermetropia, left eye: Secondary | ICD-10-CM | POA: Diagnosis not present

## 2023-11-06 DIAGNOSIS — H524 Presbyopia: Secondary | ICD-10-CM | POA: Diagnosis not present

## 2023-11-06 DIAGNOSIS — H40051 Ocular hypertension, right eye: Secondary | ICD-10-CM | POA: Diagnosis not present

## 2023-12-26 ENCOUNTER — Other Ambulatory Visit (HOSPITAL_BASED_OUTPATIENT_CLINIC_OR_DEPARTMENT_OTHER): Payer: Self-pay

## 2023-12-26 MED ORDER — FLUZONE HIGH-DOSE 0.5 ML IM SUSY
0.5000 mL | PREFILLED_SYRINGE | Freq: Once | INTRAMUSCULAR | 0 refills | Status: AC
  Filled 2023-12-26: qty 0.5, 1d supply, fill #0

## 2023-12-30 ENCOUNTER — Other Ambulatory Visit: Payer: Self-pay | Admitting: Cardiovascular Disease

## 2024-01-23 ENCOUNTER — Other Ambulatory Visit: Payer: Self-pay | Admitting: Cardiovascular Disease

## 2024-01-25 ENCOUNTER — Other Ambulatory Visit: Payer: Self-pay | Admitting: Cardiovascular Disease

## 2024-02-09 ENCOUNTER — Other Ambulatory Visit: Payer: Self-pay | Admitting: Cardiovascular Disease

## 2024-02-18 ENCOUNTER — Other Ambulatory Visit (HOSPITAL_BASED_OUTPATIENT_CLINIC_OR_DEPARTMENT_OTHER): Payer: Self-pay

## 2024-02-18 MED ORDER — COMIRNATY 30 MCG/0.3ML IM SUSY
0.3000 mL | PREFILLED_SYRINGE | Freq: Once | INTRAMUSCULAR | 0 refills | Status: AC
  Filled 2024-02-18: qty 0.3, 1d supply, fill #0

## 2024-02-22 ENCOUNTER — Other Ambulatory Visit: Payer: Self-pay | Admitting: Cardiovascular Disease

## 2024-03-05 ENCOUNTER — Ambulatory Visit: Attending: Cardiovascular Disease | Admitting: Cardiovascular Disease

## 2024-03-05 ENCOUNTER — Encounter: Payer: Self-pay | Admitting: Cardiovascular Disease

## 2024-03-05 VITALS — BP 132/66 | HR 59 | Ht 67.0 in

## 2024-03-05 DIAGNOSIS — Z951 Presence of aortocoronary bypass graft: Secondary | ICD-10-CM

## 2024-03-05 DIAGNOSIS — I2510 Atherosclerotic heart disease of native coronary artery without angina pectoris: Secondary | ICD-10-CM

## 2024-03-05 DIAGNOSIS — E785 Hyperlipidemia, unspecified: Secondary | ICD-10-CM | POA: Diagnosis not present

## 2024-03-05 DIAGNOSIS — I10 Essential (primary) hypertension: Secondary | ICD-10-CM | POA: Diagnosis not present

## 2024-03-05 DIAGNOSIS — I422 Other hypertrophic cardiomyopathy: Secondary | ICD-10-CM

## 2024-03-05 MED ORDER — AMLODIPINE BESYLATE 5 MG PO TABS
5.0000 mg | ORAL_TABLET | Freq: Every day | ORAL | 3 refills | Status: AC
Start: 2024-03-05 — End: ?

## 2024-03-05 MED ORDER — CARVEDILOL 6.25 MG PO TABS: 6.2500 mg | ORAL_TABLET | Freq: Two times a day (BID) | ORAL | 3 refills | Status: AC

## 2024-03-05 MED ORDER — ATORVASTATIN CALCIUM 40 MG PO TABS: 40.0000 mg | ORAL_TABLET | Freq: Every day | ORAL | 3 refills | Status: AC

## 2024-03-05 MED ORDER — VALSARTAN 320 MG PO TABS: 320.0000 mg | ORAL_TABLET | Freq: Every day | ORAL | 3 refills | Status: AC

## 2024-03-05 MED ORDER — HYDRALAZINE HCL 25 MG PO TABS: 25.0000 mg | ORAL_TABLET | Freq: Two times a day (BID) | ORAL | 3 refills | Status: AC

## 2024-03-05 NOTE — Assessment & Plan Note (Signed)
 History of essential hypertension blood pressure measured today 132/66.  He is on amlodipine , carvedilol , valsartan  and hydralazine .

## 2024-03-05 NOTE — Assessment & Plan Note (Addendum)
 History of hypertrophic cardiomyopathy with a septal thickness of 1.9 cm and a 16mm intracavitary gradient.  I did refer him to Dr.Chandrasekhar  who did not think he had hypertrophic cardiomyopathy

## 2024-03-05 NOTE — Patient Instructions (Signed)
 Medication Instructions:  Your physician recommends that you continue on your current medications as directed. Please refer to the Current Medication list given to you today.  *If you need a refill on your cardiac medications before your next appointment, please call your pharmacy*  Follow-Up: At [facility], you and your health needs are our priority.  As part of our continuing mission to provide you with exceptional heart care, our providers are all part of one team.  This team includes your primary Cardiologist (physician) and Advanced Practice Providers or APPs (Physician Assistants and Nurse Practitioners) who all work together to provide you with the care you need, when you need it.  Your next appointment:   1 year(s)  Provider:   Dorn Lesches, MD  We recommend signing up for the patient portal called MyChart.  Sign up information is provided on this After Visit Summary.  MyChart is used to connect with patients for Virtual Visits (Telemedicine).  Patients are able to view lab/test results, encounter notes, upcoming appointments, etc.  Non-urgent messages can be sent to your provider as well.    To learn more about what you can do with MyChart, go to [email or web address].

## 2024-03-05 NOTE — Assessment & Plan Note (Signed)
 History of CAD status post bypass grafting x 2 in 1994 with a LIMA to his LAD and a vein to the RCA.  I recast him femoral he 12/02/2020 revealing an occluded proximal LAD with a patent LIMA, left to left collaterals from the circumflex and an occluded diagonal branch as well as an occluded RCA and RCA vein graft.  He is completely asymptomatic.

## 2024-03-05 NOTE — Progress Notes (Signed)
 03/05/2024 Jerry Farrell   [birth year]  [id number]  Primary Physician Tower, Laine LABOR, MD Primary Cardiologist: Jerry Farrell Jerry Lesches MD Jerry Farrell, [place]  HPI:  Jerry Farrell is a 78 y.o.  thin-appearing married African-American male father of 3, grandfather and 5 grandchildren who is referred back to me by Dr. Randeen, his PCP, to be reestablished in my practice. I last saw him in the office 6//24.  His risk factors include treated hypertension and hyperlipidemia. There is is a family history of a brother who recently had bypass surgery. He has never smoked. Retired from [employer]. He had coronary artery bypass grafting X 2 in 1994 .he had a LIMA to his LAD and a vein to the RCA.  He denies chest pain or shortness of breath. He did see how main in the office 06/02/16 after having had a Myoview  stress test 04/11/16 that was essentially normal with no evidence of ischemia.    He had developed exertional chest tightness and dyspnea as well as fatigue.  He did not have the symptoms when I saw him back in February of 2022.  Based on this, I decided to proceed with elective outpatient diagnostic coronary angiography on 12/02/2020 via the femoral approach.  I demonstrated normal LV systolic function, and occluded proximal LAD with a patent LIMA to the LAD, left to left collaterals from the circumflex to an occluded diagonal branch, and an occluded native RCA and RCA vein graft.  Since his procedure his symptoms of chest pain and dyspnea have markedly improved.   Since I saw him a year ago he continues to do well.  He walks 3 hours a day at a 3.1 mile-per-hour pace.  He denies chest pain or shortness of breath.  I did refer him to Dr.Chandrasekhar for evaluation of asymmetric septal hypertrophy.  He does have a 1.9 cm septum with a 16mm intracavitary gradient but he did not think this was significant for hypertrophic obstructive cardiomyopathy.     Current Meds  Medication Sig   acetaminophen   (TYLENOL ) 500 MG tablet Take 1,000 mg by mouth every 6 (six) hours as needed for mild pain (pain score 1-3) or headache.   aluminum  hydroxide-magnesium  carbonate (GAVISCON) 95-358 MG/15ML SUSP Take 15 mLs by mouth as needed for indigestion.   aspirin  EC 81 MG tablet Take 1 tablet (81 mg total) by mouth daily.   famotidine  (PEPCID ) 20 MG tablet TAKE 1 TABLET BY MOUTH TWICE A DAY   ferrous sulfate  325 (65 FE) MG EC tablet Take 1 tablet (325 mg total) by mouth daily with breakfast. (Patient taking differently: Take 325 mg by mouth every other day.)   meclizine  (ANTIVERT ) 25 MG tablet Take 1 tablet (25 mg total) by mouth 3 (three) times daily as needed for dizziness.   pantoprazole  (PROTONIX ) 40 MG tablet Take 1 tablet (40 mg total) by mouth 2 (two) times daily. (Patient taking differently: Take 40 mg by mouth daily.)   SYSTANE BALANCE 0.6 % SOLN Place 1 drop into both eyes 3 (three) times daily as needed (for dryness).   tamsulosin  (FLOMAX ) 0.4 MG CAPS capsule TAKE 1 CAPSULES BY MOUTH ONCE DAILY.   [DISCONTINUED] amLODipine  (NORVASC ) 5 MG tablet Take 1 tablet (5 mg total) by mouth daily.   [DISCONTINUED] atorvastatin  (LIPITOR ) 40 MG tablet TAKE 1 TABLET (40 MG TOTAL) BY MOUTH DAILY. CALL AND SCHEDULE ANNUAL APPOINTMENT FOR FURTHER REFILLS   [DISCONTINUED] carvedilol  (COREG ) 6.25 MG tablet TAKE 1 TABLET BY MOUTH  TWICE A DAY WITH FOOD   [DISCONTINUED] hydrALAZINE  (APRESOLINE ) 25 MG tablet TAKE 1 TABLET BY MOUTH 2 TIMES DAILY   [DISCONTINUED] valsartan  (DIOVAN ) 320 MG tablet TAKE 1 TABLET (320 MG TOTAL) BY MOUTH DAILY. PATIENT MUST SCHEDULE ANNUAL APPOINTMENT FOR REFILLS     No Known Allergies  Social History   Socioeconomic History   Marital status: Married    Spouse name: Not on file   Number of children: 3   Years of education: Not on file   Highest education level: Not on file  Occupational History   Occupation: [employer]    Employer: RETIRED  Tobacco Use   Smoking status: Never    Smokeless tobacco: Never  Vaping Use   Vaping status: Never Used  Substance and Sexual Activity   Alcohol use: No    Alcohol/week: 0.0 standard drinks of alcohol   Drug use: No   Sexual activity: Yes  Other Topics Concern   Not on file  Social History Narrative   Not on file   Social Drivers of Health   Financial Resource Strain: Low Risk  (07/02/2023)   Overall Financial Resource Strain (CARDIA)    Difficulty of Paying Living Expenses: Not hard at all  Food Insecurity: No Food Insecurity (07/02/2023)   Hunger Vital Sign    Worried About Running Out of Food in the Last Year: Never true    Ran Out of Food in the Last Year: Never true  Transportation Needs: No Transportation Needs (07/02/2023)   PRAPARE - Administrator, Civil Service (Medical): No    Lack of Transportation (Non-Medical): No  Physical Activity: Insufficiently Active (07/02/2023)   Exercise Vital Sign    Days of Exercise per Week: 3 days    Minutes of Exercise per Session: 40 min  Stress: No Stress Concern Present (07/02/2023)   [employer] of Occupational Health - Occupational Stress Questionnaire    Feeling of Stress : Not at all  Social Connections: Moderately Integrated (07/02/2023)   Social Connection and Isolation Panel    Frequency of Communication with Friends and Family: More than three times a week    Frequency of Social Gatherings with Friends and Family: More than three times a week    Attends Religious Services: More than 4 times per year    Active Member of [employer] or Organizations: No    Attends Banker Meetings: Never    Marital Status: Married  Catering Manager Violence: Not At Risk (07/02/2023)   Humiliation, Afraid, Rape, and Kick questionnaire    Fear of Current or Ex-Partner: No    Emotionally Abused: No    Physically Abused: No    Sexually Abused: No     Review of Systems: General: negative for chills, fever, night sweats or weight changes.  Cardiovascular:  negative for chest pain, dyspnea on exertion, edema, orthopnea, palpitations, paroxysmal nocturnal dyspnea or shortness of breath Dermatological: negative for rash Respiratory: negative for cough or wheezing Urologic: negative for hematuria Abdominal: negative for nausea, vomiting, diarrhea, bright red blood per rectum, melena, or hematemesis Neurologic: negative for visual changes, syncope, or dizziness All other systems reviewed and are otherwise negative except as noted above.    Blood pressure 132/66, pulse (!) 59, height 5' 7 (1.702 m), SpO2 96%.  General appearance: alert and no distress Neck: no adenopathy, no carotid bruit, no JVD, supple, symmetrical, trachea midline, and thyroid  not enlarged, symmetric, no tenderness/mass/nodules Lungs: clear to auscultation bilaterally Heart: regular rate and rhythm,  S1, S2 normal, no murmur, click, rub or gallop Extremities: extremities normal, atraumatic, no cyanosis or edema Pulses: 2+ and symmetric Skin: Skin color, texture, turgor normal. No rashes or lesions Neurologic: Grossly normal  EKG EKG Interpretation Date/Time:  Wednesday March 05 2024 15:34:16 EST Ventricular Rate:  59 PR Interval:  144 QRS Duration:  86 QT Interval:  372 QTC Calculation: 368 R Axis:   60  Text Interpretation: Sinus bradycardia Nonspecific T wave abnormality When compared with ECG of 25-Nov-2021 07:41, PREVIOUS ECG IS PRESENT Confirmed by Court Carrier [phone]) on 03/05/2024 3:58:50 PM    ASSESSMENT AND PLAN:   Dyslipidemia, goal LDL below 70 History of hyperlipidemia on statin therapy with lipid profile performed 08/14/23 revealing total cholesterol 117, LDL of 48 and HDL of 41.  Essential hypertension History of essential hypertension blood pressure measured today 132/66.  He is on amlodipine , carvedilol , valsartan  and hydralazine .  S/P CABG (coronary artery bypass graft) History of CAD status post bypass grafting x 2 in 1994 with a LIMA to his  LAD and a vein to the RCA.  I recast him femoral he 12/02/2020 revealing an occluded proximal LAD with a patent LIMA, left to left collaterals from the circumflex and an occluded diagonal branch as well as an occluded RCA and RCA vein graft.  He is completely asymptomatic.  Hypertrophic cardiomyopathy (HCC) History of hypertrophic cardiomyopathy with a septal thickness of 1.9 cm and a 16mm intracavitary gradient.  I did refer him to Dr.Chandrasekhar  who did not think he had hypertrophic cardiomyopathy      Carrier DOROTHA Court MD [facility], [facility] 03/05/2024 4:06 PM

## 2024-03-05 NOTE — Assessment & Plan Note (Signed)
 History of hyperlipidemia on statin therapy with lipid profile performed 08/14/23 revealing total cholesterol 117, LDL of 48 and HDL of 41.

## 2024-07-02 ENCOUNTER — Ambulatory Visit
